# Patient Record
Sex: Female | Born: 1935 | Race: White | Hispanic: No | Marital: Married | Smoking: Never smoker
Health system: Southern US, Community
[De-identification: ages and names within clinical notes are randomized; demographics above are authoritative.]

## PROBLEM LIST (undated history)

## (undated) DIAGNOSIS — Z8639 Personal history of other endocrine, nutritional and metabolic disease: Secondary | ICD-10-CM

## (undated) DIAGNOSIS — T783XXA Angioneurotic edema, initial encounter: Secondary | ICD-10-CM

## (undated) DIAGNOSIS — I2510 Atherosclerotic heart disease of native coronary artery without angina pectoris: Secondary | ICD-10-CM

## (undated) DIAGNOSIS — L509 Urticaria, unspecified: Secondary | ICD-10-CM

## (undated) DIAGNOSIS — F0390 Unspecified dementia without behavioral disturbance: Secondary | ICD-10-CM

## (undated) DIAGNOSIS — E785 Hyperlipidemia, unspecified: Secondary | ICD-10-CM

## (undated) DIAGNOSIS — I10 Essential (primary) hypertension: Secondary | ICD-10-CM

## (undated) DIAGNOSIS — R9431 Abnormal electrocardiogram [ECG] [EKG]: Secondary | ICD-10-CM

## (undated) DIAGNOSIS — C801 Malignant (primary) neoplasm, unspecified: Secondary | ICD-10-CM

## (undated) DIAGNOSIS — N200 Calculus of kidney: Secondary | ICD-10-CM

## (undated) DIAGNOSIS — I219 Acute myocardial infarction, unspecified: Secondary | ICD-10-CM

## (undated) DIAGNOSIS — D649 Anemia, unspecified: Secondary | ICD-10-CM

## (undated) DIAGNOSIS — E039 Hypothyroidism, unspecified: Secondary | ICD-10-CM

## (undated) DIAGNOSIS — C4492 Squamous cell carcinoma of skin, unspecified: Secondary | ICD-10-CM

## (undated) DIAGNOSIS — R7303 Prediabetes: Secondary | ICD-10-CM

## (undated) DIAGNOSIS — H269 Unspecified cataract: Secondary | ICD-10-CM

## (undated) DIAGNOSIS — N390 Urinary tract infection, site not specified: Secondary | ICD-10-CM

## (undated) DIAGNOSIS — M1990 Unspecified osteoarthritis, unspecified site: Secondary | ICD-10-CM

## (undated) DIAGNOSIS — Z5189 Encounter for other specified aftercare: Secondary | ICD-10-CM

## (undated) HISTORY — DX: Anemia, unspecified: D64.9

## (undated) HISTORY — DX: Personal history of other endocrine, nutritional and metabolic disease: Z86.39

## (undated) HISTORY — DX: Urticaria, unspecified: L50.9

## (undated) HISTORY — DX: Encounter for other specified aftercare: Z51.89

## (undated) HISTORY — DX: Malignant (primary) neoplasm, unspecified: C80.1

## (undated) HISTORY — DX: Squamous cell carcinoma of skin, unspecified: C44.92

## (undated) HISTORY — DX: Angioneurotic edema, initial encounter: T78.3XXA

## (undated) HISTORY — DX: Unspecified osteoarthritis, unspecified site: M19.90

## (undated) HISTORY — PX: OTHER SURGICAL HISTORY: SHX169

## (undated) HISTORY — PX: TONSILLECTOMY: SUR1361

## (undated) HISTORY — DX: Hyperlipidemia, unspecified: E78.5

## (undated) HISTORY — DX: Prediabetes: R73.03

## (undated) HISTORY — DX: Unspecified dementia, unspecified severity, without behavioral disturbance, psychotic disturbance, mood disturbance, and anxiety: F03.90

## (undated) HISTORY — DX: Hypothyroidism, unspecified: E03.9

## (undated) HISTORY — DX: Essential (primary) hypertension: I10

## (undated) HISTORY — DX: Calculus of kidney: N20.0

## (undated) HISTORY — DX: Unspecified cataract: H26.9

## (undated) HISTORY — DX: Urinary tract infection, site not specified: N39.0

## (undated) HISTORY — PX: CARDIAC CATHETERIZATION: SHX172

## (undated) HISTORY — DX: Atherosclerotic heart disease of native coronary artery without angina pectoris: I25.10

## (undated) HISTORY — PX: TUBAL LIGATION: SHX77

## (undated) HISTORY — PX: ADENOIDECTOMY: SUR15

## (undated) HISTORY — DX: Acute myocardial infarction, unspecified: I21.9

---
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
## 1997-11-22 HISTORY — PX: APPENDECTOMY: SHX54

## 1998-11-24 ENCOUNTER — Ambulatory Visit (HOSPITAL_COMMUNITY): Admission: RE | Admit: 1998-11-24 | Discharge: 1998-11-24 | Payer: Self-pay | Admitting: Endocrinology

## 1998-11-24 ENCOUNTER — Encounter: Payer: Self-pay | Admitting: Endocrinology

## 1998-11-25 ENCOUNTER — Ambulatory Visit (HOSPITAL_COMMUNITY): Admission: RE | Admit: 1998-11-25 | Discharge: 1998-11-25 | Payer: Self-pay | Admitting: Internal Medicine

## 1999-10-29 ENCOUNTER — Ambulatory Visit (HOSPITAL_COMMUNITY): Admission: RE | Admit: 1999-10-29 | Discharge: 1999-10-29 | Payer: Self-pay | Admitting: Endocrinology

## 1999-10-29 ENCOUNTER — Encounter: Payer: Self-pay | Admitting: Endocrinology

## 2000-10-20 ENCOUNTER — Other Ambulatory Visit: Admission: RE | Admit: 2000-10-20 | Discharge: 2000-10-20 | Payer: Self-pay | Admitting: Gynecology

## 2000-11-04 ENCOUNTER — Encounter: Payer: Self-pay | Admitting: Family Medicine

## 2000-11-04 ENCOUNTER — Ambulatory Visit (HOSPITAL_COMMUNITY): Admission: RE | Admit: 2000-11-04 | Discharge: 2000-11-04 | Payer: Self-pay | Admitting: Family Medicine

## 2001-11-21 ENCOUNTER — Encounter: Payer: Self-pay | Admitting: Gynecology

## 2001-11-21 ENCOUNTER — Ambulatory Visit (HOSPITAL_COMMUNITY): Admission: RE | Admit: 2001-11-21 | Discharge: 2001-11-21 | Payer: Self-pay | Admitting: Gynecology

## 2002-07-31 ENCOUNTER — Other Ambulatory Visit: Admission: RE | Admit: 2002-07-31 | Discharge: 2002-07-31 | Payer: Self-pay | Admitting: Endocrinology

## 2002-11-19 ENCOUNTER — Other Ambulatory Visit: Admission: RE | Admit: 2002-11-19 | Discharge: 2002-11-19 | Payer: Self-pay | Admitting: Gynecology

## 2002-11-23 ENCOUNTER — Encounter: Payer: Self-pay | Admitting: Gynecology

## 2002-11-23 ENCOUNTER — Ambulatory Visit (HOSPITAL_COMMUNITY): Admission: RE | Admit: 2002-11-23 | Discharge: 2002-11-23 | Payer: Self-pay | Admitting: Gynecology

## 2003-01-06 ENCOUNTER — Inpatient Hospital Stay (HOSPITAL_COMMUNITY): Admission: EM | Admit: 2003-01-06 | Discharge: 2003-01-09 | Payer: Self-pay | Admitting: *Deleted

## 2003-01-06 ENCOUNTER — Encounter: Payer: Self-pay | Admitting: Endocrinology

## 2003-02-04 ENCOUNTER — Encounter (HOSPITAL_COMMUNITY): Admission: RE | Admit: 2003-02-04 | Discharge: 2003-05-05 | Payer: Self-pay | Admitting: Interventional Cardiology

## 2003-12-02 ENCOUNTER — Ambulatory Visit (HOSPITAL_COMMUNITY): Admission: RE | Admit: 2003-12-02 | Discharge: 2003-12-02 | Payer: Self-pay | Admitting: Gynecology

## 2004-01-16 ENCOUNTER — Ambulatory Visit (HOSPITAL_COMMUNITY): Admission: RE | Admit: 2004-01-16 | Discharge: 2004-01-16 | Payer: Self-pay | Admitting: Internal Medicine

## 2004-01-16 ENCOUNTER — Encounter: Payer: Self-pay | Admitting: Internal Medicine

## 2004-07-21 ENCOUNTER — Ambulatory Visit (HOSPITAL_COMMUNITY): Admission: RE | Admit: 2004-07-21 | Discharge: 2004-07-21 | Payer: Self-pay | Admitting: Endocrinology

## 2004-12-10 ENCOUNTER — Ambulatory Visit (HOSPITAL_COMMUNITY): Admission: RE | Admit: 2004-12-10 | Discharge: 2004-12-10 | Payer: Self-pay | Admitting: Endocrinology

## 2005-02-23 ENCOUNTER — Ambulatory Visit: Payer: Self-pay | Admitting: Family Medicine

## 2005-05-22 ENCOUNTER — Emergency Department (HOSPITAL_COMMUNITY): Admission: EM | Admit: 2005-05-22 | Discharge: 2005-05-22 | Payer: Self-pay | Admitting: Emergency Medicine

## 2005-07-05 ENCOUNTER — Ambulatory Visit: Payer: Self-pay | Admitting: Family Medicine

## 2005-07-05 ENCOUNTER — Other Ambulatory Visit: Admission: RE | Admit: 2005-07-05 | Discharge: 2005-07-05 | Payer: Self-pay | Admitting: Family Medicine

## 2005-07-14 ENCOUNTER — Ambulatory Visit: Payer: Self-pay | Admitting: Family Medicine

## 2005-12-13 ENCOUNTER — Ambulatory Visit (HOSPITAL_COMMUNITY): Admission: RE | Admit: 2005-12-13 | Discharge: 2005-12-13 | Payer: Self-pay | Admitting: Endocrinology

## 2006-12-19 ENCOUNTER — Ambulatory Visit (HOSPITAL_COMMUNITY): Admission: RE | Admit: 2006-12-19 | Discharge: 2006-12-19 | Payer: Self-pay | Admitting: Endocrinology

## 2007-01-25 ENCOUNTER — Encounter (INDEPENDENT_AMBULATORY_CARE_PROVIDER_SITE_OTHER): Payer: Self-pay | Admitting: Interventional Cardiology

## 2007-01-25 ENCOUNTER — Inpatient Hospital Stay (HOSPITAL_COMMUNITY): Admission: AD | Admit: 2007-01-25 | Discharge: 2007-01-26 | Payer: Self-pay | Admitting: Interventional Cardiology

## 2007-12-29 ENCOUNTER — Ambulatory Visit (HOSPITAL_COMMUNITY): Admission: RE | Admit: 2007-12-29 | Discharge: 2007-12-29 | Payer: Self-pay | Admitting: Endocrinology

## 2009-01-30 ENCOUNTER — Ambulatory Visit (HOSPITAL_COMMUNITY): Admission: RE | Admit: 2009-01-30 | Discharge: 2009-01-30 | Payer: Self-pay | Admitting: Endocrinology

## 2009-01-30 LAB — HM MAMMOGRAPHY

## 2010-01-19 ENCOUNTER — Telehealth: Payer: Self-pay | Admitting: Internal Medicine

## 2010-01-20 HISTORY — PX: JOINT REPLACEMENT: SHX530

## 2010-02-09 ENCOUNTER — Inpatient Hospital Stay (HOSPITAL_COMMUNITY): Admission: RE | Admit: 2010-02-09 | Discharge: 2010-02-12 | Payer: Self-pay | Admitting: Orthopedic Surgery

## 2010-02-20 ENCOUNTER — Ambulatory Visit: Payer: Self-pay | Admitting: Internal Medicine

## 2010-02-20 HISTORY — PX: CHOLECYSTECTOMY: SHX55

## 2010-02-22 ENCOUNTER — Encounter (INDEPENDENT_AMBULATORY_CARE_PROVIDER_SITE_OTHER): Payer: Self-pay

## 2010-02-24 ENCOUNTER — Telehealth: Payer: Self-pay | Admitting: Internal Medicine

## 2010-02-25 ENCOUNTER — Encounter: Payer: Self-pay | Admitting: Internal Medicine

## 2010-02-25 DIAGNOSIS — J984 Other disorders of lung: Secondary | ICD-10-CM

## 2010-03-12 ENCOUNTER — Ambulatory Visit (HOSPITAL_COMMUNITY): Admission: RE | Admit: 2010-03-12 | Discharge: 2010-03-12 | Payer: Self-pay | Admitting: Surgery

## 2010-03-12 ENCOUNTER — Encounter: Admission: RE | Admit: 2010-03-12 | Discharge: 2010-03-12 | Payer: Self-pay | Admitting: Surgery

## 2010-04-30 LAB — LIPID PANEL
HDL: 71 mg/dL — AB (ref 35–70)
Triglycerides: 168 mg/dL — AB (ref 40–160)

## 2010-06-04 ENCOUNTER — Encounter (INDEPENDENT_AMBULATORY_CARE_PROVIDER_SITE_OTHER): Payer: Self-pay | Admitting: *Deleted

## 2010-06-11 ENCOUNTER — Ambulatory Visit (HOSPITAL_COMMUNITY): Admission: RE | Admit: 2010-06-11 | Discharge: 2010-06-11 | Payer: Self-pay | Admitting: Obstetrics and Gynecology

## 2010-06-22 ENCOUNTER — Ambulatory Visit: Payer: Self-pay | Admitting: Internal Medicine

## 2010-06-22 ENCOUNTER — Ambulatory Visit: Payer: Self-pay | Admitting: Cardiovascular Disease

## 2010-06-22 DIAGNOSIS — E785 Hyperlipidemia, unspecified: Secondary | ICD-10-CM | POA: Insufficient documentation

## 2010-06-22 DIAGNOSIS — I10 Essential (primary) hypertension: Secondary | ICD-10-CM

## 2010-06-23 ENCOUNTER — Telehealth (INDEPENDENT_AMBULATORY_CARE_PROVIDER_SITE_OTHER): Payer: Self-pay | Admitting: *Deleted

## 2010-08-26 LAB — BASIC METABOLIC PANEL
BUN: 24 mg/dL — AB (ref 4–21)
Glucose: 103 mg/dL

## 2010-08-26 LAB — TSH: TSH: 0.88 u[IU]/mL (ref <=5.90)

## 2010-08-26 LAB — HEMOGLOBIN A1C: Hgb A1c MFr Bld: 5.7 % (ref 4.0–6.0)

## 2010-10-29 ENCOUNTER — Inpatient Hospital Stay (HOSPITAL_COMMUNITY): Admission: EM | Admit: 2010-10-29 | Discharge: 2010-02-25 | Payer: Self-pay | Admitting: Emergency Medicine

## 2010-11-24 ENCOUNTER — Telehealth: Payer: Self-pay | Admitting: Internal Medicine

## 2010-11-24 ENCOUNTER — Encounter: Payer: Self-pay | Admitting: Internal Medicine

## 2010-12-16 ENCOUNTER — Other Ambulatory Visit: Payer: Self-pay | Admitting: Internal Medicine

## 2010-12-16 DIAGNOSIS — R911 Solitary pulmonary nodule: Secondary | ICD-10-CM

## 2010-12-24 NOTE — Procedures (Signed)
Summary: COLONOSCOPY    Patient Name: Lindsey Erickson, Lindsey Erickson. MRN: [id number] Procedure Procedures: Colonoscopy CPT: 445 [id number].  Personnel: Endoscopist: Onaje Warne L. Juanda Chance, MD.  Exam Location: Exam performed in Endoscopy Suite.  Patient Consent: Procedure, Alternatives, Risks and Benefits discussed, consent obtained,  Indications  Surveillance of: Adenomatous Polyp(s). Initial polypectomy was performed in 1999. 1-2 Polyps were found at Index Exam. Largest polyp removed was > 19 mm. Prior polyp located in distal colon. Pathology of worst  polyp: high- grade dysplasia. The patient has not had surgery. 2000.  History  Current Medications: Patient is taking an non-steroidal medication. Patient is not currently taking Coumadin.  Pre-Exam Physical: Performed Jan 16, 2004. Cardio-pulmonary exam, Rectal exam, HEENT exam , Abdominal exam, Extremity exam, Neurological exam, Mental status exam WNL.  Exam Exam: Extent of exam reached: Cecum, extent intended: Cecum.  The cecum was identified by appendiceal orifice and IC valve. Colon retroflexion performed. Images taken. ASA Classification: II. Tolerance: good.  Monitoring: Pulse and BP monitoring, Oximetry used. Supplemental O2 given.  Colon Prep Used Miralax for colon prep. Prep results: good.  Fluoroscopy: Fluoroscopy was not used.  Sedation Meds: Demerol 45 mg. given IV. Versed 7 mg. given IV.  Findings NORMAL EXAM: Cecum.   Assessment Normal examination.  Comments: no recurrent polyps Events  Unplanned Interventions: No intervention was required.  Unplanned Events: There were no complications. Plans Patient Education: Patient given standard instructions for: Yearly hemoccult testing recommended. Patient instructed to get routine colonoscopy every 5 years.  Comments: Resume ASA tomorrow Disposition: After procedure patient sent to recovery.    CC: Alfonse Alpers.Baruch Merl Smith,MD  This report was created from the  original endoscopy report, which was reviewed and signed by the above listed endoscopist.

## 2010-12-24 NOTE — Letter (Signed)
Summary: Pre Visit Letter Revised  [facility]  [address] [place], [place] [address]   Phone: [phone]  Fax: [phone]        11/24/2010 MRN: [id number] Lindsey Erickson [address] [place], [place]  [address]             Procedure Date:  01/05/2011   Welcome to the Gastroenterology Division at [facility].    You are scheduled to see a nurse for your pre-procedure visit on 12/29/2010 at 10:00 AM  on the 3rd floor at [facility], [address]  We ask that you try to arrive at our office 15 minutes prior to your appointment time to allow for check-in.  Please take a minute to review the attached form.  If you answer "Yes" to one or more of the questions on the first page, we ask that you call the person listed at your earliest opportunity.  If you answer "No" to all of the questions, please complete the rest of the form and bring it to your appointment.    Your nurse visit will consist of discussing your medical and surgical history, your immediate family medical history, and your medications.   If you are unable to list all of your medications on the form, please bring the medication bottles to your appointment and we will list them.  We will need to be aware of both prescribed and over the counter drugs.  We will need to know exact dosage information as well.    Please be prepared to read and sign documents such as consent forms, a financial agreement, and acknowledgement forms.  If necessary, and with your consent, a friend or relative is welcome to sit-in on the nurse visit with you.  Please bring your insurance card so that we may make a copy of it.  If your insurance requires a referral to see a specialist, please bring your referral form from your primary care physician.  No co-pay is required for this nurse visit.     If you cannot keep your appointment, please call [phone] to cancel or reschedule prior to your appointment date.  This  allows [place] the opportunity to schedule an appointment for another patient in need of care.    Thank you for choosing [facility] for your medical needs.  We appreciate the opportunity to care for you.  Please visit [place] at our website  to learn more about our practice.  Sincerely, The Gastroenterology Division

## 2010-12-24 NOTE — Progress Notes (Signed)
Summary: needs fu for nodule  Phone Note Outgoing Call   Summary of Call: Lindsey Erickson, saw her 4/2 and 4/3 over [facility] at [facility] when she reacted to blood product. She hasa small rt lung 8mm nodule (passive smoker x 18 years, dad had lung cancer). She needs to see me in 3 months with non contrat ct chest. She is now haivng cholecustectomy. I did giver he my card Initial call taken by: Kalman Shan MD,  February 24, 2010 9:06 AM     Appended Document: needs fu for nodule order placed for ct and consult appt in three months.

## 2010-12-24 NOTE — Progress Notes (Signed)
Summary: Triage  Phone Note Call from Patient Call back at Home Phone [phone]   Caller: Patient Call For: Dr. Juanda Chance Reason for Call: Talk to Nurse, Referral Summary of Call: Needs to sch'd her COL and last time it was done at the hosp. Wants to know if it can be sch'd in [facility] or at the hosp. Initial call taken by: Karna Christmas,  November 24, 2010 9:34 AM  Follow-up for Phone Call        Patient called to schedule her colonscopy that is due 12/2010. She reports that she has had bright red blood in her stool off and on for about a year but yesterday she had a stool with a lot of bright red blood in the toilet. This is the "worse she has ever had." Denies pain or hx of hemorrhoids. States her stomach feels full in AM but denies any pain. Patient is on ASA 81 mg daily but not on any blood thinners. She reports her last colonoscpy was done at the hospital (2005) because she had a heart attack in 2004. Should she have a visit prior to scheduling colon and should she have it scheduled at [facility] or hospital? Please,advise. Follow-up by: Jesse Fall RN,  November 24, 2010 10:32 AM  Additional Follow-up for Phone Call Additional follow up Details #1::        it can be scheduled directly, at [facility], please prescribe Analpram cream 22.5 %, apply two times a day to the rectum Additional Follow-up by: Hart Carwin MD,  November 24, 2010 11:35 AM    Additional Follow-up for Phone Call Additional follow up Details #2::    Spoke with patient and gave her Dr. Regino Schultze recommendations. Rx to pharmacy. Scheduled patient for 12/29/10 at 10:00 AM for previsit and 01/05/11 11:00 AM with 10:00 AM arrival for colonoscopy. Letter for previsit mailed to patient. Follow-up by: Jesse Fall RN,  November 24, 2010 3:05 PM  New/Updated Medications: ANALPRAM-HC 1-2.5 % CREA (HYDROCORTISONE ACE-PRAMOXINE) Apply two times a day to rectum Prescriptions: ANALPRAM-HC 1-2.5 % CREA (HYDROCORTISONE ACE-PRAMOXINE) Apply two  times a day to rectum  #1 tube x 0   Entered by:   Jesse Fall RN   Authorized by:   Hart Carwin MD   Signed by:   Jesse Fall RN on 11/24/2010   Method used:   [facility]  [address] [phone]* (retail)       [address]       [place], [place]  [address]       Ph: [phone]       Fax: [phone]   RxID:   [id number]

## 2010-12-24 NOTE — Miscellaneous (Signed)
Summary: CT chest appt with MR follow up  ---- Converted from flag ---- ---- 02/25/2010 9:05 AM, Carron Curie CMA wrote: Pt needs consult scheduled with MR sometime in July after CT scan appt. ------------------------------  per Bjorn Loser pt has CT chest scheduled for 8.1.11 @ 1pm with follow up with MR same day @ 1:50pm.

## 2010-12-24 NOTE — Progress Notes (Signed)
Summary: copy of 8.1.11 CT report mailed to pt's home  Phone Note Call from Patient Call back at Home Phone [phone]   Caller: Patient Call For: ramaswamy Reason for Call: Talk to Nurse Summary of Call: pt would like a copy of ct report sent to her at her home address. Initial call taken by: Eugene Gavia,  June 23, 2010 8:41 AM  Follow-up for Phone Call        dr Marchelle Gearing is it ok to send a copy of the ct report to patient?    you have signed it, I just want to make sure you have given results to pt.   pls advise   Philipp Deputy [facility]  June 23, 2010 9:46 AM   Additional Follow-up for Phone Call Additional follow up Details #1::        yes, please do so Additional Follow-up by: Kalman Shan MD,  June 23, 2010 2:14 PM    Additional Follow-up for Phone Call Additional follow up Details #2::    CT report printed off from EMR.  mailed to verified home address.  pt is aware. Follow-up by: Boone Master CNA/MA,  June 23, 2010 2:25 PM

## 2010-12-24 NOTE — Miscellaneous (Signed)
Summary: Orders Update  Clinical Lists Changes  Problems: Added new problem of PULMONARY NODULE (ICD-518.89) Orders: Added new Referral order of Radiology Referral (Radiology) - Signed

## 2010-12-24 NOTE — Progress Notes (Signed)
Summary: TRIAGE  Phone Note Call from Patient Call back at Home Phone [phone]   Caller: Patient Call For: Dr. Juanda Chance Reason for Call: Talk to Nurse Summary of Call: Pt. is wanting to know when her next colonoscopy is due?  Initial call taken by: Karna Christmas,  January 19, 2010 8:58 AM  Follow-up for Phone Call        Per Colonoscopy report from 12/2003 she was due for repeat Colonoscopy 12/2008.   Message left for patient to callback. Laureen Ochs LPN  January 19, 2010 9:19 AM   Pt. states she spoke with Dr.Benford Asch and was told she can go 7 years, as long as she is feeling well. Pt. denies any GI c/o.  I will put the recall in IDX for 12/2010. Pt. instructed to call back as needed.  Follow-up by: Laureen Ochs LPN,  January 19, 2010 11:00 AM

## 2010-12-24 NOTE — Assessment & Plan Note (Signed)
Summary: PULMONARY NODULE/SEEN PT IN HOSP/[facility]   Visit Type:  Follow-up Copy to:  Hospital pt Primary Provider/Referring Provider:  Dr. Corrin Parker  CC:  Pulmonary Consult for pulmonary nodule.  Pt was seen in the hospital by MR.  Denies any problems with breathing since discharge.Lindsey Erickson  History of Present Illness:  77 year old ,never smoker (Exposed to pasisve smoke 1954 - 1972). Was admitted late march/early6 april 2011 for acute cholecystitis. CT chest3/31/2011 showed 8 mm subpleural nodule in   the anterior aspect of the right upper lobe on image number 64. Pulmonary was involved when she developed TRALI due to FFP. She recoverd from that and was discharged. Since then she had D and C in July 2011. Otherwise feeling  healthy other than chronic OA related pain of knees. SHe is following up today for fllowup of pulmonary nodule. No complaints.  Denies chest pain, dyspnea, hemoptysis, wheezing, cough, fever, chills or any resp issues. CT chest today shows stable RUL nodule to my review  Preventive Screening-Counseling & Management  Alcohol-Tobacco     Smoking Status: never     Passive Smoke Exposure: no  Comments: prior passive smoke - 1954 to 1972  Current Medications (verified): 1)  Multivitamin .... One Tab Daily 2)  Lisinopril 40 Mg Tabs (Lisinopril) .... Once Daily 3)  Crestor 10 Mg Tabs (Rosuvastatin Calcium) .... Once Daily 4)  Corgard 40 Mg Tabs (Nadolol) .... Once Daily 5)  Calcium With Vitamin D .... Two Times A Day 6)  Aspir-Low 81 Mg Tbec (Aspirin) .... Once Daily 7)  Vitamin C Cr 500 Mg Cr-Tabs (Ascorbic Acid) .... Once Daily 8)  Actonel 35 Mg Tabs (Risedronate Sodium) .... Once Weekly 9)  Synthroid 88 Mcg Tabs (Levothyroxine Sodium) .... Once Daily 10)  Msm 1000 Mg Caps (Methylsulfonylmethane) .... Take 1 Capsule By Mouth Two Times A Day  Allergies (verified): 1)  ! * Frozen Blood Plasma 2)  * Narcotics  Past History:  Past Medical History: Last updated:  06/19/2010 Myocardial Infarction hypothyroidism cholecystitis kideny stones UTI osteoporosis arthritis hypertension squamous cell carcinoma  Past Surgical History: Last updated: 06/19/2010 Appendectomy--1999 Knee replacement--2011 Cholecsytectomy--april2011 bilateral arthroscopies of knees  Family History: Last updated: 06/22/2010 mother-CHF father-allergies, cancer ?type sister-blood clot  Social History: Last updated: 06/22/2010 Patient never smoked.  Married 2 children Retired from [employer] no alcohol   Risk Factors: Smoking Status: never (06/22/2010) Passive Smoke Exposure: no (06/22/2010)  Past Pulmonary History:  Pulmonary History: 1. Acute cholecystitis. 2. History of myocardial infarction in 2004. 3. History of thyroid nodule with goiter with Hashimoto thyroiditis. 4. Hypercholesterolemia. 5. Osteopenia. 6. Hypertension. 7. Gastroesophageal reflux disease. 8. Coronary artery disease.  Family History: mother-CHF father-allergies, cancer ?type sister-blood clot  Social History: Patient never smoked.  Married 2 children Retired from [employer] no alcohol Passive Smoke Exposure:  no  Review of Systems       The patient complains of acid heartburn, indigestion, sneezing, and joint stiffness or pain.  The patient denies shortness of breath with activity, shortness of breath at rest, productive cough, non-productive cough, coughing up blood, chest pain, irregular heartbeats, loss of appetite, weight change, abdominal pain, difficulty swallowing, sore throat, tooth/dental problems, headaches, nasal congestion/difficulty breathing through nose, itching, ear ache, anxiety, depression, rash, change in color of mucus, and fever.    Vital Signs:  Patient profile:   77 year old female Height:      67 inches Weight:      168.38 pounds BMI:  26.47 O2 Sat:      97 % on Room air Temp:     99.3 degrees F oral Pulse rate:   81 /  minute BP sitting:   159 / 100  (left arm) Cuff size:   regular  Vitals Entered By: Gweneth Dimitri RN (June 22, 2010 1:53 PM)  O2 Flow:  Room air CC: Pulmonary Consult for pulmonary nodule.  Pt was seen in the hospital by MR.  Denies any problems with breathing since discharge. Comments Medications reviewed with patient Daytime contact number verified with patient. Gweneth Dimitri RN  June 22, 2010 1:54 PM    Physical Exam  General:  well developed, well nourished, in no acute distress Head:  normocephalic and atraumatic Eyes:  PERRLA/EOM intact; conjunctiva and sclera clear Ears:  TMs intact and clear with normal canals Nose:  no deformity, discharge, inflammation, or lesions Mouth:  no deformity or lesions Neck:  enlarged thyroid.  - old and chronic Chest Wall:  no deformities noted Lungs:  clear bilaterally to auscultation and percussion Heart:  regular rate and rhythm, S1, S2 without murmurs, rubs, gallops, or clicks Abdomen:  bowel sounds positive; abdomen soft and non-tender without masses, or organomegaly Msk:  no deformity or scoliosis noted with normal posture Pulses:  pulses normal Extremities:  no clubbing, cyanosis, edema, or deformity noted Neurologic:  CN II-XII grossly intact with normal reflexes, coordination, muscle strength and tone Skin:  intact without lesions or rashes Cervical Nodes:  no significant adenopathy Axillary Nodes:  no significant adenopathy Psych:  alert and cooperative; normal mood and affect; normal attention span and concentration   CT of Sinus  Procedure date:  06/22/2010  Findings:      RUL nodule is stable. Seen on image 24. THis is my personal opionion. Official report pending  Impression & Recommendations:  Problem # 1:  PULMONARY NODULE, RIGHT UPPER LOBE (ICD-518.89) Assessment Unchanged CT chest 02/19/2010 - > CT chest 06/22/2010: No change in 8mm RUL subpleural nodule. Low pretest prob for cancer given stability and non  smoking hx and size. Still requires followup for 2 years.   plan repeat ct chest in 6 months (she wants low dose radiation and limited field due to her thyroid issues, I will request as such) Orders: Radiology Referral (Radiology)  Medications Added to Medication List This Visit: 1)  Calcium With Vitamin D  .... Two times a day 2)  Vitamin C Cr 500 Mg Cr-tabs (Ascorbic acid) .... Once daily 3)  Msm 1000 Mg Caps (Methylsulfonylmethane) .... Take 1 capsule by mouth two times a day  Patient Instructions: 1)  your nodule is stable since april 2)  I am still waiting for official report - if there is a changeI will let you kow 3)  have CT chest in 6 month and return for followup opr even call for results - we can discuss over phone   Immunization History:  Influenza Immunization History:    Influenza:  historical (07/23/2009)   Appended Document: PULMONARY NODULE/SEEN PT IN HOSP/[facility]     Allergies: 1)  ! * Frozen Blood Plasma 2)  * Narcotics  Past Pulmonary History:  Pulmonary History: 1. Acute cholecystitis. 2. History of myocardial infarction in 2004. 3. History of thyroid nodule with goiter with Hashimoto thyroiditis. 4. Hypercholesterolemia. 5. Osteopenia. 6. Hypertension. 7. Gastroesophageal reflux disease. 8. Coronary artery disease.   Other Orders: Est. Patient Level III (81191)

## 2010-12-28 ENCOUNTER — Encounter (INDEPENDENT_AMBULATORY_CARE_PROVIDER_SITE_OTHER): Payer: Self-pay

## 2010-12-28 ENCOUNTER — Ambulatory Visit (INDEPENDENT_AMBULATORY_CARE_PROVIDER_SITE_OTHER)
Admission: RE | Admit: 2010-12-28 | Discharge: 2010-12-28 | Disposition: A | Discharge status: Home / Self Care | Payer: Medicare Other | Source: Ambulatory Visit | Attending: Internal Medicine | Admitting: Internal Medicine

## 2010-12-28 DIAGNOSIS — R911 Solitary pulmonary nodule: Secondary | ICD-10-CM

## 2010-12-28 DIAGNOSIS — J984 Other disorders of lung: Secondary | ICD-10-CM

## 2010-12-29 ENCOUNTER — Encounter: Payer: Self-pay | Admitting: Internal Medicine

## 2010-12-29 ENCOUNTER — Telehealth: Payer: Self-pay | Admitting: Internal Medicine

## 2010-12-31 ENCOUNTER — Telehealth: Payer: Self-pay | Admitting: Internal Medicine

## 2011-01-05 ENCOUNTER — Encounter: Payer: Self-pay | Admitting: Internal Medicine

## 2011-01-05 ENCOUNTER — Other Ambulatory Visit (AMBULATORY_SURGERY_CENTER): Payer: Medicare Other | Admitting: Internal Medicine

## 2011-01-05 DIAGNOSIS — Z86010 Personal history of colonic polyps: Secondary | ICD-10-CM

## 2011-01-05 DIAGNOSIS — K921 Melena: Secondary | ICD-10-CM

## 2011-01-05 DIAGNOSIS — K648 Other hemorrhoids: Secondary | ICD-10-CM

## 2011-01-05 LAB — HM COLONOSCOPY: HM Colonoscopy: NORMAL

## 2011-01-07 NOTE — Letter (Signed)
Summary: [facility] Instructions  [facility]  [address] [place], [place] [address]   Phone: [phone]  Fax: [phone]       Lindsey Erickson    09-20-1932    MRN: [id number]       Procedure Day /Date: Tuesday 01/05/2011     Arrival Time: 10:00 am     Procedure Time: 11:00 am     Location of Procedure:                    _x _  [facility] (4th Floor)   PREPARATION FOR COLONOSCOPY WITH MIRALAX  Starting 5 days prior to your procedure Thursday 2/9 do not eat nuts, seeds, popcorn, corn, beans, peas,  salads, or any raw vegetables.  Do not take any fiber supplements (e.g. Metamucil, Citrucel, and Benefiber). ____________________________________________________________________________________________________   THE DAY BEFORE YOUR PROCEDURE         DATE: Monday 2/13  1   Drink clear liquids the entire day-NO SOLID FOOD  2   Do not drink anything colored red or purple.  Avoid juices with pulp.  No orange juice.  3   Drink at least 64 oz. (8 glasses) of fluid/clear liquids during the day to prevent dehydration and help the prep work efficiently.  CLEAR LIQUIDS INCLUDE: Water Jello Ice Popsicles Tea (sugar ok, no milk/cream) Powdered fruit flavored drinks Coffee (sugar ok, no milk/cream) Gatorade Juice: apple, white grape, white cranberry  Lemonade Clear bullion, consomm, broth Carbonated beverages (any kind) Strained chicken noodle soup Hard Candy  4   Mix the entire bottle of Miralax with 64 oz. of Gatorade/Powerade in the morning and put in the refrigerator to chill.  5   At 3:00 pm take 2 Dulcolax/Bisacodyl tablets.  6   At 4:30 pm take one Reglan/Metoclopramide tablet.  7  Starting at 5:00 pm drink one 8 oz glass of the Miralax mixture every 15-20 minutes until you have finished drinking the entire 64 oz.  You should finish drinking prep around 7:30 or 8:00 pm.  8   If you are nauseated, you may take the 2nd Reglan/Metoclopramide  tablet at 6:30 pm.        9    At 8:00 pm take 2 more DULCOLAX/Bisacodyl tablets.     THE DAY OF YOUR PROCEDURE      DATE:  Tuesday 2/14  You may drink clear liquids until 9:00 am  (2 HOURS BEFORE PROCEDURE).   MEDICATION INSTRUCTIONS  Unless otherwise instructed, you should take regular prescription medications with a small sip of water as early as possible the morning of your procedure.         OTHER INSTRUCTIONS  You will need a responsible adult at least 75 years of age to accompany you and drive you home.   This person must remain in the waiting room during your procedure.  Wear loose fitting clothing that is easily removed.  Leave jewelry and other valuables at home.  However, you may wish to bring a book to read or an iPod/MP3 player to listen to music as you wait for your procedure to start.  Remove all body piercing jewelry and leave at home.  Total time from sign-in until discharge is approximately 2-3 hours.  You should go home directly after your procedure and rest.  You can resume normal activities the day after your procedure.  The day of your procedure you should not:   Drive   Make legal decisions  Operate machinery   Drink alcohol   Return to work  You will receive specific instructions about eating, activities and medications before you leave.   The above instructions have been reviewed and explained to me by   Ulis Rias RN  December 29, 2010 10:47 AM     I fully understand and can verbalize these instructions _____________________________ Date _______

## 2011-01-07 NOTE — Miscellaneous (Signed)
Summary: [facility] previsit  Clinical Lists Changes  Medications: Added new medication of MIRALAX   POWD (POLYETHYLENE GLYCOL 3350) As per prep  instructions. - Signed Added new medication of REGLAN 10 MG  TABS (METOCLOPRAMIDE HCL) As per prep instructions. - Signed Added new medication of DULCOLAX 5 MG  TBEC (BISACODYL) Day before procedure take 2 at 3pm and 2 at 8pm. - Signed Rx of MIRALAX   POWD (POLYETHYLENE GLYCOL 3350) As per prep  instructions.;  #255gm x 0;  Signed;  Entered by: Ulis Rias RN;  Authorized by: Hart Carwin MD;  Method [facility]: [id number] [address] [place], [place], [place]  [address], Ph: [phone], Fax: [phone] Rx of REGLAN 10 MG  TABS (METOCLOPRAMIDE HCL) As per prep instructions.;  #2 x 0;  Signed;  Entered by: Ulis Rias RN;  Authorized by: Hart Carwin MD;  Method used: [id number] [address] [place], [place], [place]  [address], Ph: [phone], Fax: [phone] Rx of DULCOLAX 5 MG  TBEC (BISACODYL) Day before procedure take 2 at 3pm and 2 at 8pm.;  #4 x 0;  Signed;  Entered by: Ulis Rias RN;  Authorized by: Hart Carwin MD;  Method used: [facility] [phone]*, [address] [place], [place], [place]  [address], Ph: [phone], Fax: [phone] Observations: Added new observation of ALLERGY REV: Done (12/29/2010 9:26)    Prescriptions: DULCOLAX 5 MG  TBEC (BISACODYL) Day before procedure take 2 at 3pm and 2 at 8pm.  #4 x 0   Entered by:   Ulis Rias RN   Authorized by:   Hart Carwin MD   Signed by:   Ulis Rias RN on 12/29/2010   Method used:   [facility]        [facility]  [address] [phone]* (retail)       [address]       [place], [place]  [address]       Ph: [phone]       Fax: [phone]   RxID:   [id number] REGLAN 10 MG  TABS (METOCLOPRAMIDE HCL) As per prep instructions.  #2 x 0  Entered by:   Ulis Rias RN   Authorized by:   Hart Carwin MD   Signed by:   Ulis Rias RN on 12/29/2010   Method used:   [facility]        [facility]  [address] [phone]* (retail)       [address]       [place], [place]  [address]       Ph: [phone]       Fax: [phone]   RxID:   [id number] MIRALAX   POWD (POLYETHYLENE GLYCOL 3350) As per prep  instructions.  #255gm x 0   Entered by:   Ulis Rias RN   Authorized by:   Hart Carwin MD   Signed by:   Ulis Rias RN on 12/29/2010   Method used:   [facility] to        [facility]  [address] [phone]* (retail)       [address]       [place], [place]  [address]  Ph: [phone]       Fax: [phone]   RxID:   [id number]

## 2011-01-07 NOTE — Progress Notes (Signed)
Summary: CT results/ mail request  Phone Note Call from Patient Call back at Home Phone [phone]   Caller: Patient Call For: Jaymin Waln Summary of Call: pt wants a copy of CT results mailed to her home address.  Initial call taken by: Tivis Ringer, CNA,  December 31, 2010 12:04 PM  Follow-up for Phone Call        MR- is this okay with you if we just mail her CT chest results? Pls advise thanks! Vernie Murders  December 31, 2010 12:22 PM   Additional Follow-up for Phone Call Additional follow up Details #1::        yeah please do so Additional Follow-up by: Kalman Shan MD,  December 31, 2010 1:05 PM    Additional Follow-up for Phone Call Additional follow up Details #2::    ct results mailed. Carron Curie CMA  December 31, 2010 1:21 PM

## 2011-01-07 NOTE — Progress Notes (Signed)
Summary: repeat ct 18 months  Phone Note Outgoing Call   Summary of Call: called patient and gave update on CT result 12/28/2010. Nodule stable for 1 year. Repeat CT 18 months and then see me. She is agreeable with plan. Pls set up Ct (ordered) and fu Initial call taken by: Kalman Shan MD,  December 31, 2010 11:49 AM  Follow-up for Phone Call        order placed.Carron Curie CMA  December 31, 2010 3:04 PM

## 2011-01-07 NOTE — Progress Notes (Signed)
Summary: Triage  Phone Note Call from Patient Call back at Home Phone [phone]   Caller: Patient Call For: Dr. Juanda Chance Reason for Call: Talk to Nurse Summary of Call: pt. had knee surgery before and wants to know if she will need an antibotic before her COL Initial call taken by: Karna Christmas,  December 29, 2010 11:52 AM  Follow-up for Phone Call        Spoke with pt and informed her that our doctors do not prescribe antibiotics prior to a colonoscopy due to the pt having had knee surgery. Advised pt to talk to her PCP or othopedic doctor to discuss if she needs antibiotics. She understood and will call back if she has further questions.Ulis Rias RN  December 29, 2010 12:55 PM

## 2011-01-13 NOTE — Miscellaneous (Signed)
Summary: anusol HC suppository  Clinical Lists Changes  Medications: Added new medication of ANUSOL-HC 25 MG  SUPP (HYDROCORTISONE ACETATE) one suppository per rectum @ bedtime - Signed Rx of ANUSOL-HC 25 MG  SUPP (HYDROCORTISONE ACETATE) one suppository per rectum @ bedtime;  #12 x 1;  Signed;  Entered by: Doristine Church RN II;  Authorized by: Hart Carwin MD;  Method used: Electronically to [id number] [address] [place], [place], [place]  [address], Ph: [phone], Fax: [phone] Observations: Added new observation of ALLERGY REV: Done (01/05/2011 12:09)    Prescriptions: ANUSOL-HC 25 MG  SUPP (HYDROCORTISONE ACETATE) one suppository per rectum @ bedtime  #12 x 1   Entered by:   Doristine Church RN II   Authorized by:   Hart Carwin MD   Signed by:   Doristine Church RN II on 01/05/2011   Method used:   [facility]ronically to        [id number]       [address]       [place], [place]  [address]       Ph: [phone]       Fax: [phone]   RxID:   [id number]

## 2011-01-13 NOTE — Procedures (Signed)
Summary: Colonoscopy  Patient: [name] Note: All result statuses are Final unless otherwise noted.  Tests: (1) Colonoscopy (COL)   COL Colonoscopy           DONE     [facility]     [address]     [place], [place]  [address]           COLONOSCOPY PROCEDURE REPORT           PATIENT:  Lindsey Erickson, Lindsey Erickson  MR#:  [id number]     BIRTHDATE:  1931/12/07, 78 yrs. old  GENDER:  female     ENDOSCOPIST:  Hedwig Morton. Juanda Chance, MD     REF. BY:  Corrin Parker, M.D.     PROCEDURE DATE:  01/05/2011     PROCEDURE:  Colonoscopy 57[address]     ASA CLASS:  Class II     INDICATIONS:  colorectal cancer screening, average risk,     hematochezia hx high grade dysplasia in a sigmoid polyp 1999, last     colon 2005 no polyps     MEDICATIONS:   Versed 7 mg, promethazine (Phenergan) 12.5 mg           DESCRIPTION OF PROCEDURE:   After the risks benefits and     alternatives of the procedure were thoroughly explained, informed     consent was obtained.  Digital rectal exam was performed and     revealed no rectal masses.   The LB PCF-H180AL [id number] endoscope     was introduced through the anus and advanced to the cecum, which     was identified by both the appendix and ileocecal valve, without     limitations.  The quality of the prep was good, using MiraLax.     The instrument was then slowly withdrawn as the colon was fully     examined.     <<PROCEDUREIMAGES>>           FINDINGS:  Internal Hemorrhoids were found (see image4).  This was     otherwise a normal examination of the colon (see image3, image2,     and image1).   Retroflexed views in the rectum revealed no     abnormalities.    The scope was then withdrawn from the patient     and the procedure completed.           COMPLICATIONS:  None     ENDOSCOPIC IMPRESSION:     1) Internal hemorrhoids     2) Otherwise normal examination     bleeding likely from hemorrhoids     RECOMMENDATIONS:     1) high fiber diet     Anusol HC supp 1 hs,  #12,1 refill     REPEAT EXAM:  In 0 year(s) for.  no recall due to age ( would be     10 years)           ______________________________     Hedwig Morton. Juanda Chance, MD           CC:           n.     eSIGNED:   Hedwig Morton. Sena Hoopingarner at 01/05/2011 11:56 AM           [name], [id number]  Note: An exclamation mark (!) indicates a result that was not dispersed into the flowsheet. Document Creation Date: 01/05/2011 11:57 AM _______________________________________________________________________  (1) Order result status: Final Collection or observation date-time: 01/05/2011 11:48 Requested date-time:  Receipt  date-time:  Reported date-time:  Referring Physician:   Ordering Physician: Lina Sar [phone]) Specimen Source:  Source: [name] Order Number: [id number] Lab site:

## 2011-01-21 DIAGNOSIS — N200 Calculus of kidney: Secondary | ICD-10-CM | POA: Insufficient documentation

## 2011-02-06 LAB — COMPREHENSIVE METABOLIC PANEL
ALT: 15 U/L (ref 0–35)
Alkaline Phosphatase: 65 U/L (ref 39–117)
CO2: 25 mEq/L (ref 19–32)
Chloride: 100 mEq/L (ref 96–112)
GFR calc non Af Amer: >60 mL/min (ref >60)
Glucose, Bld: 98 mg/dL (ref 70–99)
Potassium: 4 mEq/L (ref 3.5–5.1)
Sodium: 136 mEq/L (ref 135–145)

## 2011-02-06 LAB — CBC
Hemoglobin: 14 g/dL (ref 12.0–15.0)
MCH: 33.7 pg (ref 26.0–34.0)
RBC: 4.15 MIL/uL (ref 3.87–5.11)
WBC: 6 10*3/uL (ref 4.0–10.5)

## 2011-02-06 LAB — BASIC METABOLIC PANEL
CO2: 28 mEq/L (ref 19–32)
Chloride: 100 mEq/L (ref 96–112)
GFR calc Af Amer: >60 mL/min (ref >60)
Potassium: 3.8 mEq/L (ref 3.5–5.1)
Sodium: 135 mEq/L (ref 135–145)

## 2011-02-10 ENCOUNTER — Encounter: Payer: Self-pay | Admitting: *Deleted

## 2011-02-10 LAB — CBC
HCT: 27.9 % — ABNORMAL LOW (ref 36.0–46.0)
HCT: 28.2 % — ABNORMAL LOW (ref 36.0–46.0)
HCT: 29.1 % — ABNORMAL LOW (ref 36.0–46.0)
Hemoglobin: 10 g/dL — ABNORMAL LOW (ref 12.0–15.0)
Hemoglobin: 9.4 g/dL — ABNORMAL LOW (ref 12.0–15.0)
Hemoglobin: 9.6 g/dL — ABNORMAL LOW (ref 12.0–15.0)
MCHC: 33.9 g/dL (ref 30.0–36.0)
MCHC: 34 g/dL (ref 30.0–36.0)
MCHC: 34.1 g/dL (ref 30.0–36.0)
MCV: 94.8 fL (ref 78.0–100.0)
MCV: 95.3 fL (ref 78.0–100.0)
Platelets: 330 10*3/uL (ref 150–400)
Platelets: 433 10*3/uL — ABNORMAL HIGH (ref 150–400)
RBC: 3.07 MIL/uL — ABNORMAL LOW (ref 3.87–5.11)
RDW: 13.3 % (ref 11.5–15.5)
RDW: 13.8 % (ref 11.5–15.5)
RDW: 13.9 % (ref 11.5–15.5)
WBC: 18.4 10*3/uL — ABNORMAL HIGH (ref 4.0–10.5)
WBC: 19.5 10*3/uL — ABNORMAL HIGH (ref 4.0–10.5)

## 2011-02-10 LAB — MAGNESIUM: Magnesium: 2.2 mg/dL (ref 1.5–2.5)

## 2011-02-10 LAB — BASIC METABOLIC PANEL
CO2: 24 mEq/L (ref 19–32)
Chloride: 107 mEq/L (ref 96–112)
Creatinine, Ser: 0.69 mg/dL (ref 0.4–1.2)
GFR calc Af Amer: >60 mL/min (ref >60)
Potassium: 4.3 mEq/L (ref 3.5–5.1)

## 2011-02-10 LAB — BLOOD GAS, ARTERIAL
Acid-base deficit: 3.7 mmol/L — ABNORMAL HIGH (ref 0.0–2.0)
Bicarbonate: 19.3 mEq/L — ABNORMAL LOW (ref 20.0–24.0)
Drawn by: 105521
O2 Saturation: 94.3 %
TCO2: 20.1 mmol/L (ref 0–100)
pCO2 arterial: 26.5 mmHg — ABNORMAL LOW (ref 35.0–45.0)
pO2, Arterial: 65.7 mmHg — ABNORMAL LOW (ref 80.0–100.0)

## 2011-02-10 LAB — COMPREHENSIVE METABOLIC PANEL
AST: 25 U/L (ref 0–37)
Albumin: 2.1 g/dL — ABNORMAL LOW (ref 3.5–5.2)
Albumin: 3 g/dL — ABNORMAL LOW (ref 3.5–5.2)
Alkaline Phosphatase: 66 U/L (ref 39–117)
BUN: 21 mg/dL (ref 6–23)
Calcium: 7.7 mg/dL — ABNORMAL LOW (ref 8.4–10.5)
Chloride: 100 mEq/L (ref 96–112)
Creatinine, Ser: 0.69 mg/dL (ref 0.4–1.2)
Creatinine, Ser: 0.71 mg/dL (ref 0.4–1.2)
GFR calc Af Amer: >60 mL/min (ref >60)
Glucose, Bld: 94 mg/dL (ref 70–99)
Potassium: 3.7 mEq/L (ref 3.5–5.1)
Total Bilirubin: 1.6 mg/dL — ABNORMAL HIGH (ref 0.3–1.2)
Total Protein: 5.4 g/dL — ABNORMAL LOW (ref 6.0–8.3)
Total Protein: 6.8 g/dL (ref 6.0–8.3)

## 2011-02-10 LAB — PREPARE FRESH FROZEN PLASMA

## 2011-02-10 LAB — CARDIAC PANEL(CRET KIN+CKTOT+MB+TROPI)
CK, MB: 0.7 ng/mL (ref 0.3–4.0)
CK, MB: 0.9 ng/mL (ref 0.3–4.0)
Relative Index: INVALID (ref 0.0–2.5)
Troponin I: 0.07 ng/mL — ABNORMAL HIGH (ref 0.00–0.06)
Troponin I: 0.14 ng/mL — ABNORMAL HIGH (ref 0.00–0.06)

## 2011-02-10 LAB — GLUCOSE, CAPILLARY
Glucose-Capillary: 115 mg/dL — ABNORMAL HIGH (ref 70–99)
Glucose-Capillary: 116 mg/dL — ABNORMAL HIGH (ref 70–99)
Glucose-Capillary: 121 mg/dL — ABNORMAL HIGH (ref 70–99)
Glucose-Capillary: 81 mg/dL (ref 70–99)

## 2011-02-10 LAB — LACTIC ACID, PLASMA
Lactic Acid, Venous: 0.9 mmol/L (ref 0.5–2.2)
Lactic Acid, Venous: 1 mmol/L (ref 0.5–2.2)

## 2011-02-10 LAB — POCT I-STAT 3, ART BLOOD GAS (G3+)
O2 Saturation: 96 %
TCO2: 23 mmol/L (ref 0–100)
pCO2 arterial: 32 mmHg — ABNORMAL LOW (ref 35.0–45.0)
pH, Arterial: 7.452 — ABNORMAL HIGH (ref 7.350–7.400)
pO2, Arterial: 80 mmHg (ref 80.0–100.0)

## 2011-02-10 LAB — MRSA PCR SCREENING: MRSA by PCR: NEGATIVE

## 2011-02-10 LAB — PROTIME-INR
INR: 1.32 (ref 0.00–1.49)
INR: 1.53 — ABNORMAL HIGH (ref 0.00–1.49)
Prothrombin Time: 16.3 seconds — ABNORMAL HIGH (ref 11.6–15.2)
Prothrombin Time: 34 seconds — ABNORMAL HIGH (ref 11.6–15.2)

## 2011-02-10 LAB — TRANSFUSION REACTION
DAT C3: NEGATIVE
Post RXN DAT IgG: NEGATIVE

## 2011-02-10 LAB — BRAIN NATRIURETIC PEPTIDE: Pro B Natriuretic peptide (BNP): 160 pg/mL — ABNORMAL HIGH (ref 0.0–100.0)

## 2011-02-12 LAB — URINE MICROSCOPIC-ADD ON

## 2011-02-12 LAB — HEPATIC FUNCTION PANEL
ALT: 30 U/L (ref 0–35)
AST: 23 U/L (ref 0–37)
Total Protein: 6.8 g/dL (ref 6.0–8.3)

## 2011-02-12 LAB — CBC
Hemoglobin: 12.3 g/dL (ref 12.0–15.0)
Platelets: 466 10*3/uL — ABNORMAL HIGH (ref 150–400)
RDW: 13.5 % (ref 11.5–15.5)

## 2011-02-12 LAB — POCT I-STAT, CHEM 8
BUN: 18 mg/dL (ref 6–23)
Chloride: 103 mEq/L (ref 96–112)
Potassium: 4.3 mEq/L (ref 3.5–5.1)
Sodium: 133 mEq/L — ABNORMAL LOW (ref 135–145)

## 2011-02-12 LAB — DIFFERENTIAL
Basophils Absolute: 0 10*3/uL (ref 0.0–0.1)
Lymphocytes Relative: 13 % (ref 12–46)
Monocytes Absolute: 0.8 10*3/uL (ref 0.1–1.0)
Neutro Abs: 8.2 10*3/uL — ABNORMAL HIGH (ref 1.7–7.7)

## 2011-02-12 LAB — LIPASE, BLOOD: Lipase: 24 U/L (ref 11–59)

## 2011-02-12 LAB — POCT CARDIAC MARKERS
Myoglobin, poc: 74.6 ng/mL (ref 12–200)
Troponin i, poc: <0.05 ng/mL (ref 0.00–0.09)

## 2011-02-12 LAB — URINALYSIS, ROUTINE W REFLEX MICROSCOPIC
Glucose, UA: NEGATIVE mg/dL
Leukocytes, UA: NEGATIVE
Specific Gravity, Urine: 1.013 (ref 1.005–1.030)
Urobilinogen, UA: 1 mg/dL (ref 0.0–1.0)

## 2011-02-12 LAB — URINE CULTURE

## 2011-02-15 ENCOUNTER — Ambulatory Visit (INDEPENDENT_AMBULATORY_CARE_PROVIDER_SITE_OTHER): Payer: Medicare Other | Admitting: Internal Medicine

## 2011-02-15 ENCOUNTER — Telehealth: Payer: Self-pay | Admitting: Internal Medicine

## 2011-02-15 ENCOUNTER — Encounter: Payer: Self-pay | Admitting: Internal Medicine

## 2011-02-15 ENCOUNTER — Other Ambulatory Visit (INDEPENDENT_AMBULATORY_CARE_PROVIDER_SITE_OTHER): Payer: Medicare Other

## 2011-02-15 DIAGNOSIS — E785 Hyperlipidemia, unspecified: Secondary | ICD-10-CM

## 2011-02-15 DIAGNOSIS — M8080XA Other osteoporosis with current pathological fracture, unspecified site, initial encounter for fracture: Secondary | ICD-10-CM | POA: Insufficient documentation

## 2011-02-15 DIAGNOSIS — Z79899 Other long term (current) drug therapy: Secondary | ICD-10-CM

## 2011-02-15 DIAGNOSIS — M8440XA Pathological fracture, unspecified site, initial encounter for fracture: Secondary | ICD-10-CM

## 2011-02-15 DIAGNOSIS — E039 Hypothyroidism, unspecified: Secondary | ICD-10-CM | POA: Insufficient documentation

## 2011-02-15 DIAGNOSIS — I10 Essential (primary) hypertension: Secondary | ICD-10-CM

## 2011-02-15 LAB — TYPE AND SCREEN
ABO/RH(D): O POS
Antibody Screen: NEGATIVE

## 2011-02-15 LAB — BASIC METABOLIC PANEL
CO2: 29 mEq/L (ref 19–32)
CO2: 30 mEq/L (ref 19–32)
Calcium: 8.2 mg/dL — ABNORMAL LOW (ref 8.4–10.5)
Calcium: 8.3 mg/dL — ABNORMAL LOW (ref 8.4–10.5)
Chloride: 101 mEq/L (ref 96–112)
Creatinine, Ser: 0.64 mg/dL (ref 0.4–1.2)
GFR calc Af Amer: >60 mL/min (ref >60)
GFR calc Af Amer: >60 mL/min (ref >60)
GFR calc Af Amer: >60 mL/min (ref >60)
GFR calc non Af Amer: >60 mL/min (ref >60)
Potassium: 3.6 mEq/L (ref 3.5–5.1)
Potassium: 3.7 mEq/L (ref 3.5–5.1)
Potassium: 3.9 mEq/L (ref 3.5–5.1)
Sodium: 134 mEq/L — ABNORMAL LOW (ref 135–145)
Sodium: 136 mEq/L (ref 135–145)
Sodium: 137 mEq/L (ref 135–145)

## 2011-02-15 LAB — URINALYSIS, ROUTINE W REFLEX MICROSCOPIC
Glucose, UA: NEGATIVE mg/dL
Ketones, ur: NEGATIVE mg/dL
Specific Gravity, Urine: 1.017 (ref 1.005–1.030)
pH: 7 (ref 5.0–8.0)

## 2011-02-15 LAB — COMPREHENSIVE METABOLIC PANEL
ALT: 18 U/L (ref 0–35)
AST: 21 U/L (ref 0–37)
Alkaline Phosphatase: 69 U/L (ref 39–117)
CO2: 30 mEq/L (ref 19–32)
Calcium: 9.8 mg/dL (ref 8.4–10.5)
GFR calc Af Amer: >60 mL/min (ref >60)
GFR calc non Af Amer: >60 mL/min (ref >60)
Glucose, Bld: 115 mg/dL — ABNORMAL HIGH (ref 70–99)
Potassium: 4.4 mEq/L (ref 3.5–5.1)
Sodium: 140 mEq/L (ref 135–145)

## 2011-02-15 LAB — PROTIME-INR
INR: 1.15 (ref 0.00–1.49)
INR: 1.6 — ABNORMAL HIGH (ref 0.00–1.49)
Prothrombin Time: 12.8 seconds (ref 11.6–15.2)
Prothrombin Time: 15.9 seconds — ABNORMAL HIGH (ref 11.6–15.2)

## 2011-02-15 LAB — LIPID PANEL
Cholesterol: 180 mg/dL (ref 0–200)
HDL: 63.2 mg/dL (ref >39.00)
LDL Cholesterol: 83 mg/dL (ref 0–99)
VLDL: 33.8 mg/dL (ref 0.0–40.0)

## 2011-02-15 LAB — HEPATIC FUNCTION PANEL
ALT: 16 U/L (ref 0–35)
Total Protein: 7 g/dL (ref 6.0–8.3)

## 2011-02-15 LAB — CBC
HCT: 24.1 % — ABNORMAL LOW (ref 36.0–46.0)
HCT: 27.5 % — ABNORMAL LOW (ref 36.0–46.0)
HCT: 32.5 % — ABNORMAL LOW (ref 36.0–46.0)
Hemoglobin: 11.2 g/dL — ABNORMAL LOW (ref 12.0–15.0)
Hemoglobin: 14 g/dL (ref 12.0–15.0)
Hemoglobin: 8.2 g/dL — ABNORMAL LOW (ref 12.0–15.0)
Hemoglobin: 9.2 g/dL — ABNORMAL LOW (ref 12.0–15.0)
MCHC: 34.4 g/dL (ref 30.0–36.0)
MCV: 95.1 fL (ref 78.0–100.0)
RBC: 2.53 MIL/uL — ABNORMAL LOW (ref 3.87–5.11)
RBC: 2.89 MIL/uL — ABNORMAL LOW (ref 3.87–5.11)
RBC: 3.44 MIL/uL — ABNORMAL LOW (ref 3.87–5.11)
RBC: 4.37 MIL/uL (ref 3.87–5.11)
WBC: 6 10*3/uL (ref 4.0–10.5)
WBC: 7.9 10*3/uL (ref 4.0–10.5)
WBC: 9.1 10*3/uL (ref 4.0–10.5)

## 2011-02-15 MED ORDER — LISINOPRIL 20 MG PO TABS: 20.0000 mg | ORAL_TABLET | Freq: Every day | ORAL | Status: DC

## 2011-02-15 NOTE — Assessment & Plan Note (Addendum)
Hashimotos with hx goiter - will continue to follow with endo for same (kumar) Will send for prior records from [facility] to review No results found for this basename: TSH

## 2011-02-15 NOTE — Assessment & Plan Note (Signed)
The current statin medical regimen is effective;  continue present plan and medications.

## 2011-02-15 NOTE — Patient Instructions (Signed)
It was good to see you today. Will send for prior records from Dr. Dagoberto Ligas as discussed to review - continue to follow with Dr. Lucianne Muss and your other specialists as we reviewed today Reduce dose of lisinopril as discussed - call when refills are needed. Medications reviewed, no other changes at this time. Test(s) ordered today. Your results will be called to you after review (48-72hours after test completion). If any changes need to be made, you will be notified at that time. Please schedule followup in 4 months to monitor blood pressure, cholesterol and medications, call sooner if problems.

## 2011-02-15 NOTE — Telephone Encounter (Signed)
Please call patient - normal results (cholesterol and LFTs). No medication changes recommended. Thanks.

## 2011-02-15 NOTE — Progress Notes (Signed)
Subjective:    Patient ID: Lindsey Erickson, female    DOB: [birth year], 77 y.o.   MRN: [id number]  HPI New patient to me and our division , here to establish care.  Previously followed with [facility] who has retired.  Reviewed chronic medical issues today: Hypothyroid  -follows with endo for same. hx hashimotos and goiter - s/p bx x 3 - benign - last in 07/2002- the patient reports compliance with medication(s) as prescribed. Denies adverse side effects. No weight or skin changes. Dyslipidemia - on statin for same since MI 2004 - the patient reports compliance with medication(s) as prescribed. Denies adverse side effects. hypertention - the patient reports compliance with medication(s) as prescribed. Denies adverse side effects. Osteoporosis - hx fx following accidental fall x2 - takes actonel with vit d + calcium as prescribed. Denies adverse side effects from same.  Past Medical History  Diagnosis Date  . Hypothyroidism   . Cholecystitis   . Kidney stones   . Osteoporosis   . Arthritis   . Squamous cell carcinoma   . HYPERTENSION 06/22/2010  . Hyperlipidemia    Family History  Problem Relation Age of Onset  . Allergies Father   . Cancer Father   . Coronary artery disease Mother     History  Substance Use Topics  . Smoking status: Never Smoker   . Smokeless tobacco: Not on file   Comment: Married, 2 children. Retired from [employer] [address]  . Alcohol Use: No    Review of Systems Constitutional: Negative for fever.  Respiratory: Negative for cough and shortness of breath.   Cardiovascular: Negative for chest pain.  Gastrointestinal: Negative for abdominal pain.  Musculoskeletal: Negative for gait problem.  Skin: Negative for rash.  Neurological: Negative for dizziness.  No other specific complaints in a complete review of systems (except as listed in HPI above).     Objective:   Physical Exam BP 122/72  Pulse 70  Temp(Src) 98.2 F (36.8 C) (Oral)  Ht 5\' 7"  (1.702 m)   Wt 176 lb 6.4 oz (80.015 kg)  BMI 27.63 kg/m2 Constitutional: She is oriented to person, place, and time. She appears well-developed and well-nourished. No distress.  HENT:  Head: Normocephalic and atraumatic.  Nose: Nose normal.  Mouth/Throat: Oropharynx is clear and moist. No oropharyngeal exudate.  Eyes: Conjunctivae and EOM are normal. Pupils are equal, round, and reactive to light. No scleral icterus.  Neck: Normal range of motion. Neck supple. No JVD present. No thyromegaly present.  Cardiovascular: Normal rate, regular rhythm and normal heart sounds.  No murmur heard. Pulmonary/Chest: Effort normal and breath sounds normal. No respiratory distress. She has no wheezes.  Abdominal: Soft. Bowel sounds are normal. She exhibits no distension. There is no tenderness.  Musculoskeletal: Normal range of motion. She exhibits no edema.  Neurological: She is alert and oriented to person, place, and time. No cranial nerve deficit. Coordination normal.  Skin: Skin is warm and dry. No rash noted. No erythema.  Psychiatric: She has a normal mood and affect. Her behavior is normal. Judgment and thought content normal.       Lab Results  Component Value Date   WBC 6.0 06/09/2010   HGB 14.0 06/09/2010   HCT 40.7 06/09/2010   PLT 256 06/09/2010   ALT 15 06/09/2010   AST 20 06/09/2010   NA 135 06/09/2010   NA 136 06/09/2010   K 3.8 06/09/2010   K 4.0 06/09/2010   CL 100 06/09/2010   CL  100 06/09/2010   CREATININE 0.64 06/09/2010   CREATININE 0.63 06/09/2010   BUN 15 06/09/2010   BUN 15 06/09/2010   CO2 28 06/09/2010   CO2 25 06/09/2010   INR 1.32 02/25/2010      Assessment & Plan:  See problem list. Medications, prior hospital records and labs reviewed today. Time spent with the patient today 45 minutes, greater than 50% time spent counseling patient on hypertension, cholesterol and medication review. Also review of prior records in hospital system and brought with the patient today.

## 2011-02-15 NOTE — Assessment & Plan Note (Signed)
Continue bisphos and Calcium + D fx hx accidental fall in 12/2000 (elbow R) and 05/2008 (foot L)

## 2011-02-15 NOTE — Assessment & Plan Note (Signed)
occasionak symptoms hypoperfusion when SBP <100 - reduce dose ACEI now (lisinopril 40 to 20qd) BP Readings from Last 3 Encounters:  02/15/11 122/72

## 2011-02-16 NOTE — Telephone Encounter (Signed)
Pt Notified with lab results....02/16/11@9 :31LMB

## 2011-02-23 ENCOUNTER — Other Ambulatory Visit: Payer: Self-pay | Admitting: Dermatology

## 2011-02-23 ENCOUNTER — Encounter: Payer: Self-pay | Admitting: Internal Medicine

## 2011-04-09 NOTE — Cardiovascular Report (Signed)
NAME:  Lindsey Erickson, Lindsey Erickson NO.:  [id number]   MEDICAL RECORD NO.:  [id number]                   PATIENT TYPE:  INP   LOCATION:  2038                                 FACILITY:  [facility]   PHYSICIAN:  Lesleigh Noe, M.D.            DATE OF BIRTH:  [birth year]   DATE OF PROCEDURE:  01/07/2003  DATE OF DISCHARGE:                              CARDIAC CATHETERIZATION   INDICATION FOR PROCEDURE:  Non-Q-wave myocardial infarction.   PROCEDURES:  1. Left heart catheterization.  2. Selective coronary angiography.  3. Left ventriculography.   DESCRIPTION OF PROCEDURE:  After informed consent, a 6 French sheath was  placed in the right femoral artery using the modified Seldinger technique.  A 6 [place] AT multipurpose catheter was used for hemodynamic recordings,  left ventriculography, and selective left and right coronary angiography.  The patient tolerated the procedure without significant complications.   RESULTS:   HEMODYNAMIC DATA:  1. Aortic pressure  105/68.  2. Left ventricular pressure 101/14.   LEFT VENTRICULOGRAPHY:  The left ventricle is normal in size.  Contractility  is diminished.  Mid- to distal inferior wall severe hypokinesis is noted.  EF is noted to be 40-45.   CORONARY ANGIOGRAPHY:  1. Left main coronary artery:  Widely patent.  2. Left anterior descending coronary artery:  The left anterior descending     coronary artery is a large vessel.  It wraps around the left ventricular     apex.  No significant obstruction is noted in the LAD.  Two small     diagonal branches also arise from it.  3. Circumflex artery:  The circumflex coronary artery is large.  It gives     origin to three obtuse marginal branches.  In the mid vessel before the     origin of the small second obtuse marginal, there is an area of     hypodensity where there is obstruction of the artery by 30-40%.  This     probably represents a region of recanalized thrombus  that likely     represents the culprit lesion for the patient's acute ischemic event.  It     appears that she has successfully recanalized this and has no significant     high-grade obstruction present.  4. Ramus branch:  There is a large ramus branch that arises from the distal     left main.  No significant obstruction is noted.  Narrowed luminal     irregularities are seen.  5. Right coronary artery:  The right coronary artery is large.  The mid     vessel contains an eccentric 50-70% lesion depending on the view chosen.     The distal ______ supplies the proximal inferior wall and is not the     region that exhibits the wall motion abnormality.  The distal vessel is     free of any significant obstruction.  CONCLUSIONS:  1. Acute coronary syndrome secondary to thrombosis followed by spontaneous     recanalization of the mid-circumflex.  There is residual less than 50%     stenosis in the mid-circumflex with superimposed thrombus.  2. Moderate mid-right coronary artery disease, 50-70%.  The left anterior     descending is widely patent.  3. Left ventricular dysfunction with mid- to distal inferior wall severe     hypokinesis and an ejection fraction of 40%.   PLAN:  Medical therapy including Integrilin x24 additional hours, add  Plavix.  Aggressive risk factor modification.  If recurrent angina, consider  PCI on both the circumflex and RCA.                                               Lesleigh Noe, M.D.    HWS/MEDQ  D:  01/07/2003  T:  01/07/2003  Job:  [id number]   cc:   Alfonse Alpers. Dagoberto Ligas, M.D.  [address], [address]  [place]  [place] [address]  Fax: [phone]   [facility]iac Catheter[facility]

## 2011-04-09 NOTE — Consult Note (Signed)
NAME:  Lindsey Erickson, Lindsey Erickson NO.:  [id number]   MEDICAL RECORD NO.:  [id number]                   PATIENT TYPE:  INP   LOCATION:  2038                                 FACILITY:  [facility]   PHYSICIAN:  Cassell Clement, M.D.              DATE OF BIRTH:  07-Oct-1932   DATE OF CONSULTATION:  DATE OF DISCHARGE:                                   CONSULTATION   HISTORY OF PRESENT ILLNESS:  This is a 77 year old married Caucasian female  who was admitted last evening with left arm pain and left chest pain.  Shortly after she was in bed ready to go to sleep last evening, she  developed a deep, aching discomfort in her left arm.  The pain then spread  to her left chest.  She took six baby aspirin.  She took her blood pressure  on her machine, and it was elevated at 187/97.  She asked her husband to  check her blood pressure, and he found that it was up to 212/130 range when  he took it.  She felt nauseated and felt the need to burp.  The family noted  that she had pallor, and that the skin appeared to be clammy.  She came to  the emergency room.  After arrival, she was given IV nitroglycerin with some  relief.  She was given IV Phenergan which also seemed to relieve the pain  temporarily.  However, the discomfort has persisted off and on over the  course of the day.  Her initial enzymes in the emergency room showed CK-MB  of 2.8 and troponin I of 0.15.  Subsequent set 8 hours later shows a rise in  CK-MB to 13, and a rise in troponin I to 2.21.  Her electrocardiogram in the  emergency room was normal.  The patient has a past history of  hypercholesterolemia.  Cholesterols were in the 340 range prior to her  agreeing to finally go on statin therapy several years ago.  She is now on  Zocor 40 mg a day.  She has a history of hypertension and has been on  lisinopril 10 mg a day.  She is not diabetic.  She does not get any regular  exercise.   FAMILY HISTORY:  Her mother  died of congestive heart failure and had a  myocardial infarction and died at age 51.  Dr. Lyn Records was her  mother's doctor.  The patient's father died at 44 of cancer.  She has a  sister who has had heart problems as well.  The patient is married and has  two daughters.  She works at [employer] as a Conservation officer, nature part time.  It is a stressful job, and she is considering quitting.   SURGICAL HISTORY:  Previous surgery included appendicitis and removal of  kidney stone.   REVIEW OF SYSTEMS:  She has a history of Hashimoto's thyroiditis  in 1981.  Otherwise, review of systems unremarkable.  She has not had any history of  GI bleeding.   PHYSICAL EXAMINATION:  VITAL SIGNS:  Blood pressure 126/100, pulse 116 and  regular.  Respirations are normal.  Color is good.  She is in no acute  distress, but still complaining of some dull chest pain.  HEENT:  On head and neck exam, pupils are equal and reactive.  Sclerae are  clear.  Mouth and pharynx are normal.  Jugular venous pressure normal.  Carotids normal.  No bruits.  The chest is clear.  HEART:  Reveals tachycardia.  There is no S4 or gallop.  There is no murmur,  click or rub.  ABDOMEN:  Soft and nontender.  EXTREMITIES:  Good pulses, no edema.   EKG:  Repeat EKG done at 4:37 p.m. shows nonspecific ST-T wave changes are  now present.   IMPRESSION:  1. Subendocardial non-Q wave myocardial infarction approximately 16 hours     ago.  2. Hypercholesterolemia.  3. Hypertensive cardiovascular disease.   DISPOSITION:  The patient is already on IV heparin and IV nitroglycerin  which will be continued.  We are going to increase her beta-blocker to 50 mg  b.i.d. for better rate control.  We will add Xanax for anxiety.  Anticipate  cardiac catheterization in the a.m.  The patient has requested Dr. Verdis Prime do the cath and assume care since he has treated other family members.                                                Cassell Clement, M.D.    TB/MEDQ  D:  01/06/2003  T:  01/06/2003  Job:  [id number]   cc:   Lesleigh Noe, M.D.  [address]  [place]  [place] [address]  Fax: [phone]   Reather Littler, M.D.  [address], [address]  [place]  [place] [address]  Fax: [phone]   Alfonse Alpers. Dagoberto Ligas, M.D.  [address]  [place]  [place] [address]  Fax: [phone]

## 2011-04-09 NOTE — Discharge Summary (Signed)
   NAME:  Lindsey Erickson, Lindsey Erickson                        ACCOUNT NO.:  [id number]   MEDICAL RECORD NO.:  [id number]                   PATIENT TYPE:  INP   LOCATION:  2038                                 FACILITY:  [facility]   PHYSICIAN:  Alfonse Alpers. Dagoberto Ligas, M.D.             DATE OF BIRTH:  24-Apr-1932   DATE OF ADMISSION:  01/06/2003  DATE OF DISCHARGE:  01/08/2003                                 DISCHARGE SUMMARY   HISTORY:  This is a 77 year old woman who presents with a history of chest  pain.  Her pain began in the left arm and then she had left sided chest  pain.  She presented to the emergency room and was found to have positive  enzymes.   She has a history of dyslipidemia however she has been reluctant to take  large doses of Zocor.   She also has a history of hypothyroidism which has been controlled.   PHYSICAL EXAMINATION:  Physical exam on admission revealed a well-developed  woman who was in no acute distress.  LUNGS:  Her lungs were clear.  CARDIOVASCULAR:  Cardiovascular examination revealed her rhythm to be  regular.   IMPRESSION ON ADMISSION:  1. Myocardial ischemia.  2. Hypothyroidism.  3. Dyslipidemia.   HOSPITAL COURSE:  The patient was admitted to the hospital.  She was seen by  cardiology service and they considered that she had a subendocardial non Q  wave myocardial infarction.  She was started on heparin and IV  nitroglycerin.  She improved, did not have any more episodes of chest pain.  She was scheduled for cardiac catheterization which was performed and this  showed moderate disease 40% to 60%.  She was then discharged.   IMPRESSION ON DISCHARGE:  1. Subendocardial myocardial infarction.  2. Dyslipidemia.  3. Hypothyroidism.  4. History of hypertension.   DISCHARGE MEDICATIONS:  1. She will have nitroglycerin to take p.r.n.  2. Enteric coated aspirin.  3. Synthroid 0.088 mg one daily.  4. Zocor 40 mg one daily.  5. Zestril 10 mg daily.    FOLLOW UP:   She will be seen in the office in two weeks.   CONDITION ON DISCHARGE:  Improved.                                                Alfonse Alpers. Dagoberto Ligas, M.D.    CGG/MEDQ  D:  01/08/2003  T:  01/08/2003  Job:  [id number]

## 2011-04-09 NOTE — Discharge Summary (Signed)
NAMEVEANNA, DOWER NO.:  [id number]   MEDICAL RECORD NO.:  [id number]          PATIENT TYPE:  INP   LOCATION:  3731                         FACILITY:  [facility]   PHYSICIAN:  Lyn Records, M.D.   DATE OF BIRTH:  November 01, 1932   DATE OF ADMISSION:  01/25/2007  DATE OF DISCHARGE:  01/26/2007                               DISCHARGE SUMMARY   ADMISSION DIAGNOSIS:  Chest pain consistent with unstable angina.   DISCHARGE DIAGNOSES:  1. Chest pain consistent with unstable angina.  Myocardial infarction      ruled out with negative cardiac enzymes x3, questionable etiology -      musculoskeletal versus pericarditis  2. Left ventricular systolic function at the lower limits of normal      with an ejection fraction of 50% via 2-D echocardiogram on January 25, 2007.  3. Coronary artery disease, status post non-Q-wave myocardial      infarction in February 2004, with medical management.  4. Hypertension.  5. Dyslipidemia.  6. Hashimoto's thyroiditis.  7. Stress Cardiolite negative for inducible ischemia or infarct with      an ejection fraction of 64%, July 2006.   CONSULTATIONS:  None.   PROCEDURES:  None during this admission.   HOSPITAL COURSE:  Ms. Brazell is a 77 year old Caucasian female with a  known history of coronary artery disease, status post non-Q-wave MI in  February 2004, with medical management.  She was admitted to the [facility] yesterday with a chief complaint of chest pain, consistent  with unstable angina.  Chest pain worsened with deep inspiration and was  not relieved with sublingual nitroglycerin.  The patient was placed on  IV nitroglycerin and IV heparin upon arrival without improvement of her  chest pain.  Serial cardiac enzymes were negative x3, with a peak  troponin of 0.05.  A 12-lead EKG revealed normal sinus rhythm with a  ventricular rate of 75 beats per minute without evidence of acute ST-  segment or T-wave changes.  A  chest x-ray revealed no acute  cardiopulmonary process.  A STAT 2-D echocardiogram revealed an LV  systolic function at the lower limits of normal with an EF of 50%.  There was hypokinesis of the basal inferior wall and mild MR.  On this morning, the patient underwent a stress Cardiolite that was  negative for inducible ischemia or infarction with an EF of 67%.  Normal  LV wall motion was noted.  On yesterday, the patient was given IV  Decadron 6 mg x1, with improvement of pleuritic chest pain.  For the  remainder of the admission, the patient had a complaint of mild chest  pain without radiation.  There were no other associated symptoms.  IV  nitroglycerin was discontinued.  The patient's home medications were  continued during this admission.  She was discharged to home in stable  condition on January 26, 2007, with improvement in her chest discomfort.   Upon discharge, she was advised to take Advil 400 mg twice daily for  four to six days and continue her  home medications.   LABORATORY DATA:  Sodium 137, potassium 3.9, chloride 105, CO2 25,  glucose 104, BUN 27, creatinine 1.01.  Total bilirubin 1, alkaline  phosphatase 51, AST 22, ALT 19, total protein 6.8, serum albumin 3.6.  Serial cardiac enzymes:  CK total 68, 72, and 73.  CK-MB 0.8, and 0.9  x2.  Troponin I 0.01, 0.05, and 0.03.  BNP less than 30.  PT 14, INR  1.1, PTT 30.  White blood count 7.3, hemoglobin 12.8, hematocrit 38,  platelets 268,000.  Heparin level 1.38 and 0.68, respectively.   X-RAYS:  As stated in the hospital course.   ELECTROCARDIOGRAMS:  As stated in the hospital course.   CONDITION ON DISCHARGE:  Stable.   DISCHARGE MEDICATIONS:  1. Advil 400 mg twice daily for 4-6 days.  This represents an addition      to the patient's home medical regimen.  2. Coreg 12.5 mg twice daily.  3. Fish oil 1,000 mg twice daily.  4. MSM 2 tablets daily.  5. Multivitamin daily.  6. Calcium plus vitamin D 600 mg twice  daily.  7. Vitamin C 500 mg twice daily.  8. Aspirin 81 mg daily.  9. Zestril 40 mg daily.  10.Crestor 5 mg daily.  11.Synthroid 88 mcg daily.  12.Sublingual nitroglycerin 0.4 mg as needed for chest pain.   DISCHARGE INSTRUCTIONS:  1. Continue a low-salt, low-fat, and low-cholesterol diet.  2. No restrictions of on activity.   FOLLOWUP ARRANGEMENTS:  A follow-p appointment has been scheduled with  Dr. Verdis Prime on February 06, 2007 at 2:15 p.m.  The patient is scheduled  to call [phone], if she is unable to keep the scheduled appointment.      Tylene Fantasia, [place]      Lyn Records, M.D.  Electronically Signed    RDM/MEDQ  D:  01/26/2007  T:  01/26/2007  Job:  [id number]   cc:   Alfonse Alpers. Dagoberto Ligas, M.D.

## 2011-04-09 NOTE — H&P (Signed)
Lindsey Erickson NO.:  [id number]   MEDICAL RECORD NO.:  [id number]          PATIENT TYPE:  INP   LOCATION:  3731                         FACILITY:  [facility]   PHYSICIAN:  Lyn Records, M.D.   DATE OF BIRTH:  1931-12-20   DATE OF ADMISSION:  01/25/2007  DATE OF DISCHARGE:                              HISTORY & PHYSICAL   PRIMARY CARE PHYSICIAN:  Dr. Dagoberto Ligas.   CARDIOLOGIST:  Verdis Prime, M.D.   REASON FOR ADMISSION:  Chest pain consistent with unstable angina.   HISTORY OF PRESENT ILLNESS:  Lindsey Erickson is a 77 year old Caucasian  female with a known history of coronary artery disease status post non-Q-  wave MI in February 2004 with medical management.  She also has a  history of hypertension, dyslipidemia, and Hashimoto's thyroiditis.  The  patient complained of nonexertional, to the left of her sternum,  intermittent chest pain since Sunday.  The chest pain was sudden in  onset and was described as an ache lasting greater than 12 hours before  ending and recurring.  She denies radiation.  She also denies shortness  of breath, diaphoresis, nausea, vomiting or dizziness.  The chest pain  worsens with deep inspiration.  She admits to soreness upon palpation.  She has a positive history of previous chest pain episodes; however,  states that this is dissimilar in quality or character to previous chest  pain episodes.  On this morning, she presented to her primary care  physician's office for further evaluation of the chest pain.  A 12-lead  EKG was obtained and revealed normal sinus rhythm with a ventricular  rate of 75 beats per minute.  There was no evidence of acute ST-  segment/T-wave changes.  Chest x-ray was also obtained and was negative  for acute disease.  The patient was directly admitted to [facility] for evaluation of her chest pain and to rule out MI.  She was  started on IV nitroglycerin without pain improvement.  She is also on IV  heparin.   PAST MEDICAL HISTORY:  1. Coronary artery disease, status post non-Q-wave MI in February 2004      with medical management.  2. Hypertension.  3. Hashimoto's thyroiditis.  4. Stress Cardiolite negative for inducible ischemia or infarct with      an EF of 64% in July 2006.  There were no regional wall motion      abnormalities.   ALLERGIES:  1. CODEINE.  2. DARVOCET-N 100.  3. PERCOCET.   MEDICATIONS:  1. Fish oil 1000 mg twice daily.  2. MSM 2 tablets twice daily.  3. Multivitamin daily.  4. Calcium plus vitamin D 600 mg twice daily.  5. Aspirin 81 mg daily.  6. Coreg 12.5 mg twice daily.  7. Zestril 40 mg daily.  8. Crestor 5 mg daily.  9. Synthroid 88 mcg daily.  10.Vitamin C 500 mg twice daily.   FAMILY HISTORY:  1. Mother deceased, CHF.  2. Father deceased, cancer.   SOCIAL HISTORY:  Married with two adult children.  Lives with her  husband in  [place].  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  She denies tobacco,  alcohol or illicit drug use.   REVIEW OF SYSTEMS:  All other systems reviewed are negative other than  what is stated in the HPI.   PHYSICAL EXAM:  GENERAL:  A 77 year old female, pleasant and  cooperative, NAD.  VITALS:  Temperature 96.5, blood pressure 155/82, pulse 78, respirations  20, O2 saturations 100% over room air.  HEENT:  Unremarkable.  NECK:  Supple without JVD or carotid bruits, bilaterally.  Carotid  upstrokes 2+.  No lymphadenopathy or thyromegaly.  PULMONARY:  Breath sounds are equal and clear to auscultation  bilaterally.  No use of accessory muscles.  CV:  Regular rate and rhythm.  Normal S1-S2 without murmurs, gallops,  clicks or rubs.  ABDOMEN:  Soft, nontender, nondistended with active bowel sounds.  No  masses, organomegaly or bilateral bruits.  EXTREMITIES:  No peripheral edema.  DP pulses 2+/2, bilaterally.  SKIN:  Warm and dry without rashes or lesions.   NEURO:  No focal motor or sensory deficits.  PSYCH:  Normal mood and affect.   LABORATORY DATA:  White blood count 7.3, hemoglobin 12.8, hematocrit  38.0, platelets 268,000.  PT 14.0, INR 1.1, PTT 30.  CMET, BNP, chest x-  ray results and serial cardiac enzymes are pending.  A 12-lead EKG  normal sinus rhythm with a ventricular rate of 75 beats per minute with  nonspecific ST-segment/T-wave changes.  There was no evidence of  ischemia.   ASSESSMENT:  1. Chest pain consistent with unstable angina.  2. Coronary artery disease status post non-Q-wave myocardial      infarction February 2004 with medical management.  3. Hypertension.  4. Dyslipidemia.  5. Stress Cardiolite negative for inducible ischemia or infarct with      an ejection fraction of 64% in July 2006.  6. Hashimoto's thyroiditis.   PLAN:  1. The patient was directly admitted to the [facility] with a      diagnosis of unstable angina to a cardiac telemetry unit under the      service of Dr. Verdis Prime.  2. Rule out MI.  Cardiac panel q.8h. x3.  3. Rule out pericarditis with a STAT 2-D echocardiogram.  4. Continue home medications.  Discontinue Lopressor 25 mg twice      daily.  5. Up ad lib.  6. Vitals q.4h.  7. Diet 2 grams sodium fat modified diet.  8. The patient was seen, interviewed and examined by Dr. Verdis Prime      who participated in the medical decision making and plan of care.  9. Further measures per Dr. Verdis Prime.      Tylene Fantasia, [place]      Lyn Records, M.D.  Electronically Signed    RDM/MEDQ  D:  01/25/2007  T:  01/25/2007  Job:  [id number]   cc:   Alfonse Alpers. Dagoberto Ligas, M.D.

## 2011-04-09 NOTE — H&P (Signed)
NAME:  Lindsey Erickson, Lindsey Erickson                        ACCOUNT NO.:  [id number]   MEDICAL RECORD NO.:  [id number]                   PATIENT TYPE:  INP   LOCATION:  2038                                 FACILITY:  [facility]   PHYSICIAN:  Reather Littler, M.D.                    DATE OF BIRTH:  Sep 03, 1932   DATE OF ADMISSION:  01/06/2003  DATE OF DISCHARGE:                                HISTORY & PHYSICAL   CHIEF COMPLAINT:  Chest pain.   HISTORY OF PRESENT ILLNESS:  This is a 77 year old, Caucasian female who  came to the emergency room with onset of left upper arm pain at midnight  last night.  This apparently woke her up from sleep.  She had an aching  sensation in the left arm which has not worsened by any movement, pressure  or associated with any stiffness.  She subsequently started having  substernal aching pain which she described as a dull pain.  This did not  radiate to her neck, but a little bit to the right side.  She had some  associated nausea, but no shortness of breath or palpitations.  She  apparently had a blood pressure at home between 180-212 on two occasions and  came to the emergency room to be evaluated.  On evaluation in the ER, the  patient had some decrease in her chest pain and nitroglycerin was not tried.  However, she felt that while coming into the emergency room, her pain was  worse while trying to walk or move around.  The patient did not have any  vomiting or dizziness.  Her blood pressure on arrival to the emergency room  was 194/97.   MEDICATIONS:  1. Restoril 10 mg daily.  2. Synthroid 88 mcg daily.  3. Zocor 40 mg daily.   ALLERGIES:  Intolerant to CODEINE and DARVOCET.   FAMILY HISTORY:  Positive for MI in mother who was over 40 years old.  Her  sister has unknown heart problems with positive history of hypertension, but  no diabetes.   SOCIAL HISTORY:  She is married and a nonsmoker.   REVIEW OF SYMPTOMS:  ENDOCRINE:  The patient has had  hypercholesterolemia  for several years, on treatment for four years.  She has had no diabetes.  CARDIOPULMONARY:  Her blood pressure has been treated with medication for  about two years.  She has not had any previous episodes of chest pain  similar to this, although she says that she has had mild episodes with more  on the right side of the chest and more atypical.  She has not had any  history of asthma or shortness of breath.  GASTROINTESTINAL:  No history of  GI problems except from reflux.  In 1998, she had palpitations which were  relieved by reducing her Synthroid dose.   PHYSICAL EXAMINATION:  GENERAL:  The patient is alert and in  no acute  distress.  VITAL SIGNS:  Pulse 118 per minute, blood pressure 130/73, temperature  normal.  HEENT:  Eyes externally normal.  Fundi not abnormal with normal blood  vessels.  ENT exam appears normal.  NECK:  She has moderate thyroid enlargement especially on the right side  which is smooth.  No lymphadenopathy.  Carotids are normal.  HEART:  Heart sounds normal.  No chest wall tenderness.  LUNGS:  Clear.  ABDOMEN:  No distention, mass or tenderness.  No bruit or abnormal  pulsation.  GENITALIA:  Deferred.  EXTREMITIES:  No edema or skin rash.  Decreased pedal pulses.  Normal  femoral and popliteal pulses.   LABORATORY DATA AND X-RAY FINDINGS:  EKG normal.  Labs are all normal except  for troponin of 0.15.   ASSESSMENT/PLAN:  The patient has chest pain suggestive of angina, although  her electrocardiogram is normal.  She does have high risk factors and will  need full cardiac evaluation.  Currently, the patient is on nitroglycerin  and heparin that was just started and this will be continued.                                               Reather Littler, M.D.    AK/MEDQ  D:  01/06/2003  T:  01/06/2003  Job:  [id number]

## 2011-06-15 ENCOUNTER — Telehealth: Payer: Self-pay | Admitting: Internal Medicine

## 2011-06-15 ENCOUNTER — Ambulatory Visit (INDEPENDENT_AMBULATORY_CARE_PROVIDER_SITE_OTHER): Payer: Medicare Other | Admitting: Internal Medicine

## 2011-06-15 ENCOUNTER — Encounter: Payer: Self-pay | Admitting: Internal Medicine

## 2011-06-15 ENCOUNTER — Other Ambulatory Visit (INDEPENDENT_AMBULATORY_CARE_PROVIDER_SITE_OTHER): Payer: Medicare Other

## 2011-06-15 DIAGNOSIS — E785 Hyperlipidemia, unspecified: Secondary | ICD-10-CM

## 2011-06-15 DIAGNOSIS — E039 Hypothyroidism, unspecified: Secondary | ICD-10-CM

## 2011-06-15 DIAGNOSIS — I10 Essential (primary) hypertension: Secondary | ICD-10-CM

## 2011-06-15 LAB — TSH: TSH: 0.54 u[IU]/mL (ref 0.35–5.50)

## 2011-06-15 MED ORDER — NITROGLYCERIN 0.4 MG SL SUBL: 0.4000 mg | SUBLINGUAL_TABLET | SUBLINGUAL | Status: DC | PRN

## 2011-06-15 NOTE — Telephone Encounter (Signed)
Please call patient: tests show normal thyroid results. No treatment changes recommended. Please mail copy to pt and fax copy to Dr. Lucianne Muss (her endo) per pt request at [facility] - thanks  Lab Results  Component Value Date   TSH 0.54 06/15/2011

## 2011-06-15 NOTE — Assessment & Plan Note (Signed)
Hashimotos with hx goiter - will continue to follow with endo for same (kumar) Recheck now and send to endo  Lab Results  Component Value Date   TSH 0.88 08/26/2010

## 2011-06-15 NOTE — Telephone Encounter (Signed)
Called pt was told she was not home left msg with gentleman for her to return call concerning labs...06/15/11@4 :48pm/LMB

## 2011-06-15 NOTE — Assessment & Plan Note (Signed)
occasional symptoms hypoperfusion when SBP <100 -  previously reduce dose ACEI 01/2011 (lisinopril 40 to 20qd) which helped symptoms but BP uncontrolled Resumed ACEI 40mg  qd - med list updated today BP Readings from Last 3 Encounters:  06/15/11 132/80  02/15/11 122/72

## 2011-06-15 NOTE — Patient Instructions (Signed)
It was good to see you today. Medications reviewed, no changes at this time.  call when refills are needed. Test(s) ordered today. Your results will be called to you after review (48-72hours after test completion). If any changes need to be made, you will be notified at that time. Will also send copy of labs to you and Dr. Lucianne Muss Please schedule followup in 6 months to monitor blood pressure, cholesterol and medications, call sooner if problems.

## 2011-06-15 NOTE — Progress Notes (Signed)
  Subjective:    Patient ID: Lindsey Erickson, female    DOB: 28-Mar-1932, 77 y.o.   MRN: [id number]  HPI here for follow up - Reviewed chronic medical issues today:  Hypothyroid  -follows with endo for same. hx hashimotos and goiter - s/p bx x 3 - benign - last in 07/2002- the patient reports compliance with medication(s) as prescribed. Denies adverse side effects. No weight or skin changes.  Dyslipidemia - on statin for same since MI 2004 - the patient reports compliance with medication(s) as prescribed. Denies adverse side effects.  hypertention - the patient reports compliance with medication(s) as prescribed. Denies adverse side effects.  Osteoporosis - hx fx following accidental fall x2 - takes actonel with vit d + calcium as prescribed. Denies adverse side effects from same.  Considering R TKR with ortho - ongoing synvisc injections for pain control, no swelling, no nocturnal pain  Past Medical History  Diagnosis Date  . Hypothyroidism   . Cholecystitis 02/2010  . Kidney stones   . Osteoporosis   . Squamous cell carcinoma   . Hyperlipidemia   . Arthritis   . HYPERTENSION    Review of Systems Constitutional: Negative for fever.  Respiratory: Negative for cough and shortness of breath.   Cardiovascular: Negative for chest pain.  Neurological: Negative for dizziness.     Objective:   Physical Exam  BP 132/80  Pulse 74  Temp(Src) 97.9 F (36.6 C) (Oral)  Ht 5\' 7"  (1.702 m)  Wt 179 lb 1.9 oz (81.248 kg)  BMI 28.05 kg/m2  SpO2 99% Constitutional: She is oriented to person, place, and time. She appears well-developed and well-nourished. No distress.  Neck: Normal range of motion. Neck supple. No JVD present. No thyromegaly present.  Cardiovascular: Normal rate, regular rhythm and normal heart sounds.  No murmur heard. Pulmonary/Chest: Effort normal and breath sounds normal. No respiratory distress. She has no wheezes.  Abdominal: Soft. Bowel sounds are normal. She exhibits  no distension. There is no tenderness.  Musculoskeletal: Normal range of motion. She exhibits no edema.  Neurological: She is alert and oriented to person, place, and time. No cranial nerve deficit. Coordination normal.  Psychiatric: She has a normal mood and affect. Her behavior is normal. Judgment and thought content normal.       Lab Results  Component Value Date   WBC 6.0 06/09/2010   HGB 14.0 06/09/2010   HCT 40.7 06/09/2010   PLT 256 06/09/2010   CHOL 180 02/15/2011   TRIG 169.0* 02/15/2011   HDL 63.20 02/15/2011   ALT 16 02/15/2011   AST 20 02/15/2011   NA 138 08/26/2010   K 4.1 08/26/2010   CL 100 06/09/2010   CL 100 06/09/2010   CREATININE 0.6 08/26/2010   BUN 24* 08/26/2010   CO2 28 06/09/2010   CO2 25 06/09/2010   TSH 0.88 08/26/2010   INR 1.32 02/25/2010   HGBA1C 5.7 08/26/2010      Assessment & Plan:  See problem list. Medications, prior hospital records and labs reviewed today.

## 2011-06-15 NOTE — Assessment & Plan Note (Signed)
The current statin medical regimen is effective;  continue present plan and medications.  Lab Results  Component Value Date   LDLCALC 83 02/15/2011

## 2011-06-16 NOTE — Telephone Encounter (Signed)
Notified pt with results, also mail & fax copy to pt and Dr. Lucianne Muss per her request...06/16/11@10 :57am/LMB

## 2011-07-16 ENCOUNTER — Other Ambulatory Visit: Payer: Self-pay | Admitting: *Deleted

## 2011-07-16 MED ORDER — CARVEDILOL PHOSPHATE ER 40 MG PO CP24: 40.0000 mg | ORAL_CAPSULE | Freq: Every day | ORAL | Status: DC

## 2011-07-16 NOTE — Telephone Encounter (Signed)
Pt drop off form needing new rx for Coreg CR 40mg  take 1 daily # [facility]. Was previously rx by Dr. Dagoberto Ligas no longer see him. [facility] to [facility]....07/16/11@12 :07pm/LMB

## 2011-09-09 ENCOUNTER — Other Ambulatory Visit: Payer: Self-pay | Admitting: Internal Medicine

## 2011-09-09 DIAGNOSIS — R911 Solitary pulmonary nodule: Secondary | ICD-10-CM

## 2011-09-13 ENCOUNTER — Other Ambulatory Visit: Payer: Medicare Other

## 2011-10-07 ENCOUNTER — Other Ambulatory Visit: Payer: Self-pay | Admitting: Dermatology

## 2011-10-08 ENCOUNTER — Other Ambulatory Visit: Payer: Self-pay | Admitting: *Deleted

## 2011-10-08 DIAGNOSIS — I10 Essential (primary) hypertension: Secondary | ICD-10-CM

## 2011-10-08 MED ORDER — RISEDRONATE SODIUM 35 MG PO TABS: 35.0000 mg | ORAL_TABLET | ORAL | Status: DC

## 2011-10-08 MED ORDER — ROSUVASTATIN CALCIUM 10 MG PO TABS: 10.0000 mg | ORAL_TABLET | Freq: Every day | ORAL | Status: DC

## 2011-10-08 MED ORDER — NITROGLYCERIN 0.4 MG SL SUBL: 0.4000 mg | SUBLINGUAL_TABLET | SUBLINGUAL | Status: DC | PRN

## 2011-10-08 MED ORDER — LISINOPRIL 40 MG PO TABS: 40.0000 mg | ORAL_TABLET | Freq: Every day | ORAL | Status: DC

## 2011-10-08 MED ORDER — LEVOTHYROXINE SODIUM 88 MCG PO TABS: 88.0000 ug | ORAL_TABLET | Freq: Every day | ORAL | Status: DC

## 2011-10-08 NOTE — Telephone Encounter (Signed)
[facility] needing refills on synthroid,lisinopril, actonel, crestor, nitrostat, md approve [facility], notified pt spoke with husband gave info....10/08/11@3 :35pm/LMB

## 2011-11-01 ENCOUNTER — Other Ambulatory Visit: Payer: Self-pay | Admitting: Internal Medicine

## 2011-11-01 DIAGNOSIS — Z1231 Encounter for screening mammogram for malignant neoplasm of breast: Secondary | ICD-10-CM

## 2011-11-18 ENCOUNTER — Ambulatory Visit
Admission: RE | Admit: 2011-11-18 | Discharge: 2011-11-18 | Disposition: A | Discharge status: Home / Self Care | Payer: Medicare Other | Source: Ambulatory Visit | Attending: Internal Medicine | Admitting: Internal Medicine

## 2011-11-18 DIAGNOSIS — Z1231 Encounter for screening mammogram for malignant neoplasm of breast: Secondary | ICD-10-CM

## 2011-12-14 ENCOUNTER — Ambulatory Visit: Payer: Medicare Other | Admitting: Internal Medicine

## 2011-12-17 ENCOUNTER — Ambulatory Visit (INDEPENDENT_AMBULATORY_CARE_PROVIDER_SITE_OTHER): Payer: Medicare Other | Admitting: Internal Medicine

## 2011-12-17 ENCOUNTER — Ambulatory Visit (INDEPENDENT_AMBULATORY_CARE_PROVIDER_SITE_OTHER)
Admission: RE | Admit: 2011-12-17 | Discharge: 2011-12-17 | Disposition: A | Discharge status: Home / Self Care | Payer: Medicare Other | Source: Ambulatory Visit

## 2011-12-17 ENCOUNTER — Ambulatory Visit: Payer: Medicare Other | Admitting: Internal Medicine

## 2011-12-17 ENCOUNTER — Encounter: Payer: Self-pay | Admitting: Internal Medicine

## 2011-12-17 ENCOUNTER — Other Ambulatory Visit (INDEPENDENT_AMBULATORY_CARE_PROVIDER_SITE_OTHER): Payer: Medicare Other

## 2011-12-17 VITALS — BP 120/85 | HR 71 | Temp 97.4°F | Wt 179.1 lb

## 2011-12-17 DIAGNOSIS — M8080XA Other osteoporosis with current pathological fracture, unspecified site, initial encounter for fracture: Secondary | ICD-10-CM

## 2011-12-17 DIAGNOSIS — E785 Hyperlipidemia, unspecified: Secondary | ICD-10-CM

## 2011-12-17 DIAGNOSIS — M8440XA Pathological fracture, unspecified site, initial encounter for fracture: Secondary | ICD-10-CM

## 2011-12-17 DIAGNOSIS — R300 Dysuria: Secondary | ICD-10-CM

## 2011-12-17 DIAGNOSIS — G4700 Insomnia, unspecified: Secondary | ICD-10-CM

## 2011-12-17 LAB — URINALYSIS, ROUTINE W REFLEX MICROSCOPIC
Nitrite: POSITIVE
Specific Gravity, Urine: 1.015 (ref 1.000–1.030)
Urobilinogen, UA: 0.2 (ref 0.0–1.0)
pH: 7 (ref 5.0–8.0)

## 2011-12-17 LAB — LIPID PANEL
Cholesterol: 182 mg/dL (ref 0–200)
HDL: 58.9 mg/dL (ref >39.00)
LDL Cholesterol: 93 mg/dL (ref 0–99)
Total CHOL/HDL Ratio: 3
Triglycerides: 149 mg/dL (ref 0.0–149.0)
VLDL: 29.8 mg/dL (ref 0.0–40.0)

## 2011-12-17 MED ORDER — ZOLPIDEM TARTRATE 5 MG PO TABS: 5.0000 mg | ORAL_TABLET | Freq: Every evening | ORAL | Status: DC | PRN

## 2011-12-17 NOTE — Progress Notes (Signed)
  Subjective:    Patient ID: Lindsey Erickson, female    DOB: 1932-02-17, 78 y.o.   MRN: [id number]  HPI here for follow up - Reviewed chronic medical issues today:  Hypothyroid  -follows with endo for same. hx hashimotos and goiter - s/p bx x 3 - benign - last in 07/2002- the patient reports compliance with medication(s) as prescribed. Denies adverse side effects. No weight or skin changes.  Dyslipidemia - on statin for same since MI 2004 - the patient reports compliance with medication(s) as prescribed. Denies adverse side effects.  hypertention - the patient reports compliance with medication(s) as prescribed. Denies adverse side effects.  Osteoporosis - hx fx following accidental fall x2 - takes actonel with vit d + calcium as prescribed. Denies adverse side effects from same. ?new dexa  R knee DJD - delaying R TKR with ortho - improved with synvisc injections for pain control, no swelling, no nocturnal pain  Past Medical History  Diagnosis Date  . Hypothyroidism   . Cholecystitis 02/2010  . Kidney stones   . Osteoporosis   . Squamous cell carcinoma   . Hyperlipidemia   . Arthritis   . HYPERTENSION    Review of Systems Constitutional: Negative for fever.  Respiratory: Negative for cough and shortness of breath.   Cardiovascular: Negative for chest pain.  GU: dysuria and "raw" feeling x1 week, negative vaginal discharge Neurological: Negative for dizziness.     Objective:   Physical Exam  BP 120/85  Pulse 71  Temp(Src) 97.4 F (36.3 C) (Oral)  Wt 179 lb 1.9 oz (81.248 kg)  SpO2 96% Constitutional: She appears well-developed and well-nourished. No distress.  Neck: Normal range of motion. Neck supple. No JVD present. No thyromegaly present.  Cardiovascular: Normal rate, regular rhythm and normal heart sounds.  No murmur heard. Pulmonary/Chest: Effort normal and breath sounds normal. No respiratory distress. She has no wheezes.  Psychiatric: She has a normal mood and  affect. Her behavior is normal. Judgment and thought content normal.      Lab Results  Component Value Date   WBC 6.0 06/09/2010   HGB 14.0 06/09/2010   HCT 40.7 06/09/2010   PLT 256 06/09/2010   CHOL 180 02/15/2011   TRIG 169.0* 02/15/2011   HDL 63.20 02/15/2011   ALT 16 02/15/2011   AST 20 02/15/2011   NA 138 08/26/2010   K 4.1 08/26/2010   CL 100 06/09/2010   CL 100 06/09/2010   CREATININE 0.6 08/26/2010   BUN 24* 08/26/2010   CO2 28 06/09/2010   CO2 25 06/09/2010   TSH 0.54 06/15/2011   INR 1.32 02/25/2010   HGBA1C 5.7 08/26/2010      Assessment & Plan:  See problem list. Medications and labs reviewed today.  Dysuria x1 week - check UA. Treat with Cipro with UTI or Diflucan if negative UA  insomnia, occasional. Low-dose Ambien as needed.Prescription provided

## 2011-12-17 NOTE — Patient Instructions (Signed)
It was good to see you today. Test(s) ordered today. Your results will be called to you after review (48-72hours after test completion). If any changes need to be made, you will be notified at that time. Ambien as needed - Your prescription(s) have been given to you to submit to your pharmacy. Please take as directed and contact our office if you believe you are having problem(s) with the medication(s). we'll make referral for bone density. Our office will contact you regarding appointment(s) once made. Please schedule followup in 6 months, call sooner if problems.

## 2011-12-17 NOTE — Assessment & Plan Note (Signed)
The current statin medical regimen is effective;  continue present plan and medications.  Lab Results  Component Value Date   LDLCALC 83 02/15/2011    

## 2011-12-17 NOTE — Assessment & Plan Note (Signed)
Continue bisphos and Calcium + D fx hx accidental fall in 12/2000 (elbow R) and 05/2008 (foot L) Check DEXA now to monitor tx (actonel for years)

## 2011-12-20 ENCOUNTER — Other Ambulatory Visit: Payer: Self-pay | Admitting: Internal Medicine

## 2011-12-20 ENCOUNTER — Telehealth: Payer: Self-pay | Admitting: Internal Medicine

## 2011-12-20 MED ORDER — FLUCONAZOLE 150 MG PO TABS: 150.0000 mg | ORAL_TABLET | Freq: Once | ORAL | Status: AC

## 2011-12-20 NOTE — Telephone Encounter (Signed)
Pt is requesting med call in for UTI---Per pt have not heard from this office on which infection it is--UTI or yeast infection.---Pharm--[facility] [address]---

## 2011-12-21 NOTE — Telephone Encounter (Signed)
erx for diflucan done 1/28 via lab result note - see chart - thanks

## 2011-12-23 ENCOUNTER — Encounter: Payer: Self-pay | Admitting: Internal Medicine

## 2012-03-10 ENCOUNTER — Telehealth: Payer: Self-pay | Admitting: *Deleted

## 2012-03-10 ENCOUNTER — Ambulatory Visit (INDEPENDENT_AMBULATORY_CARE_PROVIDER_SITE_OTHER): Payer: Medicare Other | Admitting: Endocrinology

## 2012-03-10 ENCOUNTER — Encounter: Payer: Self-pay | Admitting: Endocrinology

## 2012-03-10 VITALS — BP 130/82 | HR 89 | Temp 98.2°F

## 2012-03-10 DIAGNOSIS — L259 Unspecified contact dermatitis, unspecified cause: Secondary | ICD-10-CM

## 2012-03-10 MED ORDER — METHYLPREDNISOLONE SODIUM SUCC 125 MG IJ SOLR
125.0000 mg | Freq: Once | INTRAMUSCULAR | Status: AC
  Administered 2012-03-10: 125 mg via INTRAMUSCULAR

## 2012-03-10 NOTE — Patient Instructions (Signed)
You can continue the "desonide," as needed. I hope you feel better soon.  If you don't feel better by next week, please call back.

## 2012-03-10 NOTE — Progress Notes (Signed)
Subjective:    Patient ID: Lindsey Erickson, female    DOB: October 29, 1932, 78 y.o.   MRN: [id number]  HPI Pt states 9 days of moderate rash on the neck, right ear, right flank, and arms.  She has assoc itching.  she saw dermatologist 3 days ago, and was rx'ed desonide.   Past Medical History  Diagnosis Date  . Hypothyroidism   . Cholecystitis 02/2010  . Kidney stones   . Osteoporosis   . Squamous cell carcinoma   . Hyperlipidemia   . Arthritis   . HYPERTENSION     Past Surgical History  Procedure Date  . Appendectomy 1999  . Knee repalcement 2011  . Cholecystectomy 02/2010  . Bilateral arthroscopies of knees   . Joint replacement 01/2010    L TKR - allusio    History   Social History  . Marital Status: Married    Spouse Name: N/A    Number of Children: N/A  . Years of Education: N/A   Occupational History  . Not on file.   Social History Main Topics  . Smoking status: Never Smoker   . Smokeless tobacco: Not on file   Comment: Married, 2 children. Retired from [employer] [address]  . Alcohol Use: No  . Drug Use: No  . Sexually Active:    Other Topics Concern  . Not on file   Social History Narrative  . No narrative on file    Current Outpatient Prescriptions on File Prior to Visit  Medication Sig Dispense Refill  . Ascorbic Acid (VITAMIN C) 500 MG tablet Take 500 mg by mouth daily.        Marland Kitchen aspirin 81 MG tablet Take 81 mg by mouth daily.        . Calcium Carbonate-Vitamin D (CALCIUM-VITAMIN D) 500-200 MG-UNIT per tablet Take 1 tablet by mouth 2 (two) times daily with a meal.        . carvedilol (COREG CR) 40 MG 24 hr capsule Take 1 capsule (40 mg total) by mouth daily.  90 capsule  3  . hydrochlorothiazide (HYDRODIURIL) 12.5 MG tablet Take 12.5 mg by mouth every morning.      Marland Kitchen levothyroxine (SYNTHROID, LEVOTHROID) 88 MCG tablet Take 1 tablet (88 mcg total) by mouth daily.  90 tablet  3  . lisinopril (PRINIVIL,ZESTRIL) 40 MG tablet Take 1 tablet (40 mg total) by mouth  daily.  90 tablet  3  . Methylsulfonylmethane (MSM) 1000 MG TABS Take 1,000 mg by mouth 2 (two) times daily.        . Multiple Vitamin (MULTIVITAMIN) tablet Take 1 tablet by mouth daily.        . nitroGLYCERIN (NITROSTAT) 0.4 MG SL tablet Place 1 tablet (0.4 mg total) under the tongue every 5 (five) minutes as needed.  90 tablet  0  . pramoxine-hydrocortisone (ANALPRAM HC) cream Apply topically 2 (two) times daily as needed.        . risedronate (ACTONEL) 35 MG tablet Take 1 tablet (35 mg total) by mouth every 7 (seven) days. with water on empty stomach, nothing by mouth or lie down for next 30 minutes.  12 tablet  3  . rosuvastatin (CRESTOR) 10 MG tablet Take 1 tablet (10 mg total) by mouth daily.  90 tablet  3  . zolpidem (AMBIEN) 5 MG tablet Take 1 tablet (5 mg total) by mouth at bedtime as needed for sleep.  30 tablet  3    Allergies  Allergen Reactions  .  Codeine     Pt states  . Monte-G Hc     All narcotics  . Other     Frozen Blood Plasma/ shaking, increase temp    Family History  Problem Relation Age of Onset  . Allergies Father   . Cancer Father   . Coronary artery disease Mother     BP 130/82  Pulse 89  Temp(Src) 98.2 F (36.8 C) (Oral)  SpO2 93%   Review of Systems Denies fever.    Objective:   Physical Exam VITAL SIGNS:  See vs page GENERAL: no distress Skin:  Mild red rash on these areas, except for severe rash on the right flank and right side of the abdomen.       Assessment & Plan:  Contact dermatitis, new.  Severe Solumedrol 125 mg IM

## 2012-03-10 NOTE — Telephone Encounter (Signed)
Left msg on vm have some poison ivy & saw her dermatologist on Tues was rx some cream but poison ivy is still spreading. Called pt back spoke with husband advise will need appt. Transferred to schedulers to make appt... 03/10/12@11 :49am/LMB

## 2012-04-11 ENCOUNTER — Other Ambulatory Visit: Payer: Self-pay | Admitting: Dermatology

## 2012-05-15 ENCOUNTER — Telehealth: Payer: Self-pay | Admitting: Internal Medicine

## 2012-05-15 NOTE — Telephone Encounter (Signed)
Forward to Dr. Felicity Coyer for review on 05-15-12 ym

## 2012-05-21 ENCOUNTER — Inpatient Hospital Stay (HOSPITAL_COMMUNITY)
Admission: EM | Admit: 2012-05-21 | Discharge: 2012-05-23 | DRG: 287 | Disposition: A | Discharge status: Home / Self Care | Payer: Medicare Other | Attending: Interventional Cardiology | Admitting: Interventional Cardiology

## 2012-05-21 ENCOUNTER — Encounter (HOSPITAL_COMMUNITY): Payer: Self-pay | Admitting: Emergency Medicine

## 2012-05-21 DIAGNOSIS — I10 Essential (primary) hypertension: Secondary | ICD-10-CM | POA: Diagnosis present

## 2012-05-21 DIAGNOSIS — I209 Angina pectoris, unspecified: Secondary | ICD-10-CM | POA: Diagnosis present

## 2012-05-21 DIAGNOSIS — I2510 Atherosclerotic heart disease of native coronary artery without angina pectoris: Principal | ICD-10-CM | POA: Diagnosis present

## 2012-05-21 DIAGNOSIS — M79609 Pain in unspecified limb: Secondary | ICD-10-CM | POA: Diagnosis present

## 2012-05-21 DIAGNOSIS — I249 Acute ischemic heart disease, unspecified: Secondary | ICD-10-CM

## 2012-05-21 DIAGNOSIS — E785 Hyperlipidemia, unspecified: Secondary | ICD-10-CM | POA: Diagnosis present

## 2012-05-21 LAB — BASIC METABOLIC PANEL
BUN: 17 mg/dL (ref 6–23)
Calcium: 9.8 mg/dL (ref 8.4–10.5)
GFR calc Af Amer: >90 mL/min (ref >90)
GFR calc non Af Amer: 83 mL/min — ABNORMAL LOW (ref >90)
Glucose, Bld: 147 mg/dL — ABNORMAL HIGH (ref 70–99)
Potassium: 3.6 mEq/L (ref 3.5–5.1)
Sodium: 138 mEq/L (ref 135–145)

## 2012-05-21 LAB — CBC WITH DIFFERENTIAL/PLATELET
Basophils Relative: 1 % (ref 0–1)
Eosinophils Absolute: 0.3 10*3/uL (ref 0.0–0.7)
Eosinophils Relative: 5 % (ref 0–5)
MCH: 32.8 pg (ref 26.0–34.0)
MCHC: 34.8 g/dL (ref 30.0–36.0)
MCV: 94.4 fL (ref 78.0–100.0)
Neutrophils Relative %: 45 % (ref 43–77)
Platelets: 232 10*3/uL (ref 150–400)

## 2012-05-21 MED ORDER — MORPHINE SULFATE 4 MG/ML IJ SOLN: 4.0000 mg | Freq: Once | INTRAMUSCULAR | Status: DC

## 2012-05-21 MED ORDER — ONDANSETRON HCL 4 MG/2ML IJ SOLN
4.0000 mg | Freq: Once | INTRAMUSCULAR | Status: AC
  Administered 2012-05-22: 4 mg via INTRAVENOUS
  Filled 2012-05-21: qty 2

## 2012-05-21 MED ORDER — NITROGLYCERIN 0.4 MG SL SUBL: 0.4000 mg | SUBLINGUAL_TABLET | SUBLINGUAL | Status: DC | PRN

## 2012-05-21 NOTE — ED Notes (Signed)
C/o R sided neck pain and R shoulder pain that started around 2145 tonight while sitting down.  States she took 6 baby ASA.  Pt states she was seen by cardiologist on Wednesday. No known injury.

## 2012-05-21 NOTE — ED Notes (Signed)
Lab at bedside

## 2012-05-21 NOTE — ED Notes (Signed)
Dr. Opitz at bedside. 

## 2012-05-21 NOTE — ED Notes (Signed)
Patient complaining of right sided neck pain and right shoulder pain that started tonight while she was watching TV.  Patient took blood pressure -- 189/92.  Patient took 6 baby aspirin and a Digitalis at home.  Patient states that her pain has decreased since she has been to the EDP. Patient currently reporting nausea and a feeling that she needs to "burp".  Patient denies chest pain and shortness of breath.  Patient was seen by her Cardiologist this past week due to a similar episode about a month ago (neck pain and increased blood pressure).  Patient was told that she has a "mild blockage" (doesn't know where), was put on Lasix, and referred to the Cardiologist.  Patient alert and oriented x4; PERRL present.  Upon arrival to room, patient changed into gown and connected to continuous cardiac, pulse ox, and blood pressure monitor.  Family present at bedside.  Will continue to monitor.

## 2012-05-22 ENCOUNTER — Emergency Department (HOSPITAL_COMMUNITY): Payer: Medicare Other

## 2012-05-22 ENCOUNTER — Encounter (HOSPITAL_COMMUNITY): Payer: Self-pay | Admitting: *Deleted

## 2012-05-22 ENCOUNTER — Encounter (HOSPITAL_COMMUNITY)
Admission: EM | Disposition: A | Discharge status: Home / Self Care | Payer: Self-pay | Source: Home / Self Care | Attending: Interventional Cardiology

## 2012-05-22 DIAGNOSIS — M542 Cervicalgia: Secondary | ICD-10-CM

## 2012-05-22 DIAGNOSIS — I10 Essential (primary) hypertension: Secondary | ICD-10-CM

## 2012-05-22 HISTORY — PX: LEFT HEART CATHETERIZATION WITH CORONARY ANGIOGRAM: SHX5451

## 2012-05-22 LAB — TSH: TSH: 1.104 u[IU]/mL (ref 0.350–4.500)

## 2012-05-22 LAB — LIPID PANEL
HDL: 54 mg/dL (ref >39)
LDL Cholesterol: 57 mg/dL (ref 0–99)
Total CHOL/HDL Ratio: 2.7 RATIO
Triglycerides: 170 mg/dL — ABNORMAL HIGH (ref <150)
VLDL: 34 mg/dL (ref 0–40)

## 2012-05-22 LAB — CARDIAC PANEL(CRET KIN+CKTOT+MB+TROPI)
CK, MB: 1.8 ng/mL (ref 0.3–4.0)
CK, MB: 1.6 ng/mL (ref 0.3–4.0)
CK, MB: 1.7 ng/mL (ref 0.3–4.0)
Relative Index: INVALID (ref 0.0–2.5)
Relative Index: INVALID (ref 0.0–2.5)
Total CK: 66 U/L (ref 7–177)
Troponin I: <0.3 ng/mL (ref <0.30)
Troponin I: <0.3 ng/mL (ref <0.30)
Troponin I: <0.3 ng/mL (ref <0.30)

## 2012-05-22 LAB — PROTIME-INR
INR: 1.27 (ref 0.00–1.49)
Prothrombin Time: 16.2 seconds — ABNORMAL HIGH (ref 11.6–15.2)

## 2012-05-22 LAB — MRSA PCR SCREENING: MRSA by PCR: NEGATIVE

## 2012-05-22 SURGERY — LEFT HEART CATHETERIZATION WITH CORONARY ANGIOGRAM
Anesthesia: LOCAL

## 2012-05-22 MED ORDER — VERAPAMIL HCL 2.5 MG/ML IV SOLN
INTRAVENOUS | Status: AC
  Filled 2012-05-22: qty 2

## 2012-05-22 MED ORDER — ASPIRIN 81 MG PO CHEW: 81.0000 mg | CHEWABLE_TABLET | Freq: Every day | ORAL | Status: DC

## 2012-05-22 MED ORDER — ACETAMINOPHEN 325 MG PO TABS
650.0000 mg | ORAL_TABLET | ORAL | Status: DC | PRN
  Administered 2012-05-22: 650 mg via ORAL
  Filled 2012-05-22: qty 2

## 2012-05-22 MED ORDER — SODIUM CHLORIDE 0.9 % IJ SOLN
3.0000 mL | Freq: Two times a day (BID) | INTRAMUSCULAR | Status: DC
  Administered 2012-05-22 (×2): 3 mL via INTRAVENOUS
  Administered 2012-05-23: 10:00:00 via INTRAVENOUS

## 2012-05-22 MED ORDER — SODIUM CHLORIDE 0.9 % IJ SOLN
3.0000 mL | Freq: Two times a day (BID) | INTRAMUSCULAR | Status: DC
  Administered 2012-05-22: 3 mL via INTRAVENOUS

## 2012-05-22 MED ORDER — LISINOPRIL 40 MG PO TABS
40.0000 mg | ORAL_TABLET | Freq: Every day | ORAL | Status: DC
  Administered 2012-05-22 – 2012-05-23 (×2): 40 mg via ORAL
  Filled 2012-05-22 (×2): qty 1

## 2012-05-22 MED ORDER — ZOLPIDEM TARTRATE 5 MG PO TABS: 5.0000 mg | ORAL_TABLET | Freq: Every evening | ORAL | Status: DC | PRN

## 2012-05-22 MED ORDER — MIDAZOLAM HCL 2 MG/2ML IJ SOLN
INTRAMUSCULAR | Status: AC
  Filled 2012-05-22: qty 2

## 2012-05-22 MED ORDER — ADULT MULTIVITAMIN W/MINERALS CH
1.0000 | ORAL_TABLET | Freq: Every day | ORAL | Status: DC
  Administered 2012-05-22 – 2012-05-23 (×2): 1 via ORAL
  Filled 2012-05-22 (×2): qty 1

## 2012-05-22 MED ORDER — NITROGLYCERIN IN D5W 200-5 MCG/ML-% IV SOLN
5.0000 ug/min | INTRAVENOUS | Status: DC
  Administered 2012-05-22: 5 ug/min via INTRAVENOUS
  Filled 2012-05-22: qty 250

## 2012-05-22 MED ORDER — NITROGLYCERIN 0.2 MG/ML ON CALL CATH LAB
INTRAVENOUS | Status: AC
  Filled 2012-05-22: qty 1

## 2012-05-22 MED ORDER — NITROGLYCERIN 0.4 MG SL SUBL: 0.4000 mg | SUBLINGUAL_TABLET | SUBLINGUAL | Status: DC | PRN

## 2012-05-22 MED ORDER — ASPIRIN EC 325 MG PO TBEC
325.0000 mg | DELAYED_RELEASE_TABLET | Freq: Every day | ORAL | Status: DC
  Administered 2012-05-23: 325 mg via ORAL
  Filled 2012-05-22 (×2): qty 1

## 2012-05-22 MED ORDER — ONDANSETRON HCL 4 MG/2ML IJ SOLN: 4.0000 mg | Freq: Four times a day (QID) | INTRAMUSCULAR | Status: DC | PRN

## 2012-05-22 MED ORDER — HYDROCHLOROTHIAZIDE 25 MG PO TABS
12.5000 mg | ORAL_TABLET | ORAL | Status: DC
Start: 2012-05-22 — End: 2012-05-22

## 2012-05-22 MED ORDER — SODIUM CHLORIDE 0.9 % IJ SOLN: 3.0000 mL | INTRAMUSCULAR | Status: DC | PRN

## 2012-05-22 MED ORDER — HEPARIN (PORCINE) IN NACL 100-0.45 UNIT/ML-% IJ SOLN
950.0000 [IU]/h | INTRAMUSCULAR | Status: DC
  Administered 2012-05-22: 950 [IU]/h via INTRAVENOUS
  Filled 2012-05-22 (×2): qty 250

## 2012-05-22 MED ORDER — CARVEDILOL PHOSPHATE ER 40 MG PO CP24
40.0000 mg | ORAL_CAPSULE | Freq: Every day | ORAL | Status: DC
  Administered 2012-05-22 – 2012-05-23 (×2): 40 mg via ORAL
  Filled 2012-05-22 (×2): qty 1

## 2012-05-22 MED ORDER — DIAZEPAM 5 MG PO TABS
5.0000 mg | ORAL_TABLET | ORAL | Status: AC
  Administered 2012-05-22: 5 mg via ORAL
  Filled 2012-05-22: qty 1

## 2012-05-22 MED ORDER — SODIUM CHLORIDE 0.9 % IV SOLN: INTRAVENOUS | Status: AC

## 2012-05-22 MED ORDER — ASPIRIN 81 MG PO CHEW: 324.0000 mg | CHEWABLE_TABLET | ORAL | Status: DC

## 2012-05-22 MED ORDER — LEVOTHYROXINE SODIUM 88 MCG PO TABS
88.0000 ug | ORAL_TABLET | Freq: Every day | ORAL | Status: DC
  Administered 2012-05-22 – 2012-05-23 (×2): 88 ug via ORAL
  Filled 2012-05-22 (×3): qty 1

## 2012-05-22 MED ORDER — SODIUM CHLORIDE 0.9 % IV SOLN: 250.0000 mL | INTRAVENOUS | Status: DC | PRN

## 2012-05-22 MED ORDER — HYDROCHLOROTHIAZIDE 12.5 MG PO CAPS
12.5000 mg | ORAL_CAPSULE | ORAL | Status: DC
  Administered 2012-05-22: 12.5 mg via ORAL
  Filled 2012-05-22: qty 1

## 2012-05-22 MED ORDER — SODIUM CHLORIDE 0.9 % IV SOLN: INTRAVENOUS | Status: DC

## 2012-05-22 MED ORDER — ASPIRIN 81 MG PO CHEW
324.0000 mg | CHEWABLE_TABLET | ORAL | Status: AC
  Administered 2012-05-22: 324 mg via ORAL
  Filled 2012-05-22: qty 4

## 2012-05-22 MED ORDER — LIDOCAINE HCL (PF) 1 % IJ SOLN
INTRAMUSCULAR | Status: AC
  Filled 2012-05-22: qty 30

## 2012-05-22 MED ORDER — HEPARIN BOLUS VIA INFUSION
4000.0000 [IU] | Freq: Once | INTRAVENOUS | Status: AC
  Administered 2012-05-22: 4000 [IU] via INTRAVENOUS

## 2012-05-22 MED ORDER — FENTANYL CITRATE 0.05 MG/ML IJ SOLN
INTRAMUSCULAR | Status: AC
  Filled 2012-05-22: qty 2

## 2012-05-22 MED ORDER — ATORVASTATIN CALCIUM 40 MG PO TABS
40.0000 mg | ORAL_TABLET | Freq: Every day | ORAL | Status: DC
  Filled 2012-05-22 (×2): qty 1

## 2012-05-22 MED ORDER — ACETAMINOPHEN 325 MG PO TABS: 650.0000 mg | ORAL_TABLET | ORAL | Status: DC | PRN

## 2012-05-22 MED ORDER — HEPARIN SODIUM (PORCINE) 1000 UNIT/ML IJ SOLN
INTRAMUSCULAR | Status: AC
  Filled 2012-05-22: qty 1

## 2012-05-22 MED ORDER — HEPARIN (PORCINE) IN NACL 2-0.9 UNIT/ML-% IJ SOLN
INTRAMUSCULAR | Status: AC
  Filled 2012-05-22: qty 2000

## 2012-05-22 NOTE — ED Provider Notes (Signed)
History     CSN: [id number]  Arrival date & time 05/21/12  2217   First MD Initiated Contact with Patient 05/21/12 2315      Chief Complaint  Patient presents with  . Neck Pain    (Consider location/radiation/quality/duration/timing/severity/associated sxs/prior treatment) HPI History provided by patient and family bedside. While at rest around 10 PM tonight developed right-sided jaw, neck and shoulder pain. He has some associated shortness of breath. Some nausea. No diaphoresis. Pain is moderate in severity. No known aggravating or alleviating symptoms. Patient is followed by cardiologist Dr. Verdis Prime. She reports that she had a nuclear medicine stress test done in April of this year, and her cardiologist told her she needed to have a cardiac catheterization at that time. She declined and her cardiologist told her that if she had any more of these symptoms within the next 6 months but she definitely needed a cardiac catheterization. These are the same symptoms she was having in April.  No known history of MI or coronary disease otherwise. With onset of symptoms she took 6 baby aspirin and one nitroglycerin that did help her pain some. She has an extreme reaction to codeine and all morphine products and declines any morphine. Past Medical History  Diagnosis Date  . Hypothyroidism   . Cholecystitis 02/2010  . Kidney stones   . Osteoporosis   . Squamous cell carcinoma   . Hyperlipidemia   . Arthritis   . HYPERTENSION     Past Surgical History  Procedure Date  . Appendectomy 1999  . Knee repalcement 2011  . Cholecystectomy 02/2010  . Bilateral arthroscopies of knees   . Joint replacement 01/2010    L TKR - allusio    Family History  Problem Relation Age of Onset  . Allergies Father   . Cancer Father   . Coronary artery disease Mother     History  Substance Use Topics  . Smoking status: Never Smoker   . Smokeless tobacco: Not on file   Comment: Married, 2 children.  Retired from [employer] [address]  . Alcohol Use: No    OB History    Grav Para Term Preterm Abortions TAB SAB Ect Mult Living                  Review of Systems  Constitutional: Negative for fever and chills.  HENT: Positive for neck pain. Negative for neck stiffness.   Eyes: Negative for pain.  Respiratory: Positive for shortness of breath.   Gastrointestinal: Positive for nausea. Negative for vomiting and abdominal pain.  Genitourinary: Negative for dysuria.  Musculoskeletal: Negative for back pain.  Skin: Negative for rash.  Neurological: Negative for headaches.  All other systems reviewed and are negative.    Allergies  Other; Codeine; and Monte-g hc  Home Medications   Current Outpatient Rx  Name Route Sig Dispense Refill  . VITAMIN C 500 MG PO TABS Oral Take 500 mg by mouth daily.      . ASPIRIN 81 MG PO TABS Oral Take 81 mg by mouth daily.      Marland Kitchen CALCIUM-VITAMIN D 500-200 MG-UNIT PO TABS Oral Take 1 tablet by mouth 2 (two) times daily with a meal.      . CARVEDILOL PHOSPHATE ER 40 MG PO CP24 Oral Take 1 capsule (40 mg total) by mouth daily. 90 capsule 3  . DESONIDE 0.05 % EX OINT Topical Apply topically 2 (two) times daily.    Marland Kitchen HYDROCHLOROTHIAZIDE 12.5 MG PO TABS  Oral Take 12.5 mg by mouth 3 (three) times a week. Monday Wednesday Friday    . LEVOTHYROXINE SODIUM 88 MCG PO TABS Oral Take 88 mcg by mouth daily.    Marland Kitchen LISINOPRIL 40 MG PO TABS Oral Take 40 mg by mouth daily.    Marland Kitchen MSM 1000 MG PO TABS Oral Take 1,000 mg by mouth 2 (two) times daily.      . ADULT MULTIVITAMIN W/MINERALS CH Oral Take 1 tablet by mouth daily.    Marland Kitchen NITROGLYCERIN 0.4 MG SL SUBL Sublingual Place 0.4 mg under the tongue every 5 (five) minutes as needed. For chest pain    . PRAMOXINE-HC 1-1 % EX CREA Topical Apply 1 application topically 2 (two) times daily as needed. For irritation    . RISEDRONATE SODIUM 35 MG PO TABS Oral Take 35 mg by mouth every 7 (seven) days. with water on empty stomach, nothing  by mouth or lie down for next 30 minutes.    Marland Kitchen ROSUVASTATIN CALCIUM 10 MG PO TABS Oral Take 20 mg by mouth daily.    Marland Kitchen ZOLPIDEM TARTRATE 5 MG PO TABS Oral Take 5 mg by mouth at bedtime as needed. For sleep      BP 159/77  Pulse 88  Temp 98.2 F (36.8 C)  Resp 27  SpO2 99%  Physical Exam  Constitutional: She is oriented to person, place, and time. She appears well-developed and well-nourished.  HENT:  Head: Normocephalic and atraumatic.  Eyes: Conjunctivae and EOM are normal. Pupils are equal, round, and reactive to light.  Neck: Trachea normal. Neck supple. No thyromegaly present.  Cardiovascular: Normal rate, regular rhythm, S1 normal, S2 normal and normal pulses.     No systolic murmur is present   No diastolic murmur is present  Pulses:      Radial pulses are 2+ on the right side, and 2+ on the left side.  Pulmonary/Chest: Effort normal and breath sounds normal. She has no wheezes. She has no rhonchi. She has no rales. She exhibits no tenderness.  Abdominal: Soft. Normal appearance and bowel sounds are normal. There is no tenderness. There is no CVA tenderness and negative Murphy's sign.  Musculoskeletal:       BLE:s Calves nontender, no cords or erythema, negative Homans sign  Neurological: She is alert and oriented to person, place, and time. She has normal strength. No cranial nerve deficit or sensory deficit. GCS eye subscore is 4. GCS verbal subscore is 5. GCS motor subscore is 6.  Skin: Skin is warm and dry. No rash noted. She is not diaphoretic.  Psychiatric: Her speech is normal.       Cooperative and appropriate    ED Course  Procedures (including critical care time)  Results for orders placed during the hospital encounter of 05/21/12  CBC WITH DIFFERENTIAL      Component Value Range   WBC 6.1  4.0 - 10.5 K/uL   RBC 3.96  3.87 - 5.11 MIL/uL   Hemoglobin 13.0  12.0 - 15.0 g/dL   HCT 16.1  09.6 - 04.5 %   MCV 94.4  78.0 - 100.0 fL   MCH 32.8  26.0 - 34.0 pg    MCHC 34.8  30.0 - 36.0 g/dL   RDW 40.9  81.1 - 91.4 %   Platelets 232  150 - 400 K/uL   Neutrophils Relative 45  43 - 77 %   Neutro Abs 2.7  1.7 - 7.7 K/uL   Lymphocytes Relative 41  12 - 46 %   Lymphs Abs 2.5  0.7 - 4.0 K/uL   Monocytes Relative 8  3 - 12 %   Monocytes Absolute 0.5  0.1 - 1.0 K/uL   Eosinophils Relative 5  0 - 5 %   Eosinophils Absolute 0.3  0.0 - 0.7 K/uL   Basophils Relative 1  0 - 1 %   Basophils Absolute 0.1  0.0 - 0.1 K/uL  BASIC METABOLIC PANEL      Component Value Range   Sodium 138  135 - 145 mEq/L   Potassium 3.6  3.5 - 5.1 mEq/L   Chloride 102  96 - 112 mEq/L   CO2 24  19 - 32 mEq/L   Glucose, Bld 147 (*) 70 - 99 mg/dL   BUN 17  6 - 23 mg/dL   Creatinine, Ser 4.78  0.50 - 1.10 mg/dL   Calcium 9.8  8.4 - 29.5 mg/dL   GFR calc non Af Amer 83 (*) >90 mL/min   GFR calc Af Amer >90  >90 mL/min  TROPONIN I      Component Value Range   Troponin I <0.30  <0.30 ng/mL       Date: 05/22/2012  Rate: 92  Rhythm: normal sinus rhythm  QRS Axis: normal  Intervals: normal  ST/T Wave abnormalities: ST depressions inferiorly  Conduction Disutrbances:none  Narrative Interpretation: new inf ST depressions  Old EKG Reviewed: changes noted  ASA> NTG provided. PT declines morphine for pain. Plan serial EKGs for persistent pain given symptoms and EKG as above.  12:12 AM d/w S turner on call for [employer]- agrees to ED eval and admit.   CRITICAL CARE Performed by: Sunnie Nielsen   Total critical care time: 35  Critical care time was exclusive of separately billable procedures and treating other patients.  Critical care was necessary to treat or prevent imminent or life-threatening deterioration.  Critical care was time spent personally by me on the following activities: development of treatment plan with patient and/or surrogate as well as nursing, discussions with consultants, evaluation of patient's response to treatment, examination of patient, obtaining  history from patient or surrogate, ordering and performing treatments and interventions, ordering and review of laboratory studies, ordering and review of radiographic studies, pulse oximetry and re-evaluation of patient's condition. IV nitroglycerin drip for ongoing chest pain with ST depressions on EKG.  MDM   Jaw, neck and shoulder pain with EKG as above concerning for ACS. Cardiology consult for evaluation and admission. Nitroglycerin drip initiated for active chest pain. Labs reviewed as above. Imaging obtained. Cardiology admission.        Sunnie Nielsen, MD 05/22/12 [phone]

## 2012-05-22 NOTE — Care Management Note (Signed)
    Page 1 of 1   05/22/2012     11:11:28 AM   CARE MANAGEMENT NOTE 05/22/2012  Patient:  Lindsey Erickson, Lindsey Erickson   Account Number:  [id number]  Date Initiated:  05/22/2012  Documentation initiated by:  Junius Creamer  Subjective/Objective Assessment:   adm w htn, ch apin     Action/Plan:   lives w husband, pcp dr Vikki Ports leschber   Anticipated DC Date:     Anticipated DC Plan:        DC Planning Services  CM consult      Choice offered to / List presented to:             Status of service:   Medicare Important Message given?   (If response is "NO", the following Medicare IM given date fields will be blank) Date Medicare IM given:   Date Additional Medicare IM given:    Discharge Disposition:    Per UR Regulation:  Reviewed for med. necessity/level of care/duration of stay  If discussed at Long Length of Stay Meetings, dates discussed:    Comments:  7/1 11:10a debbie Earlene Bjelland rn,bsn [phone]

## 2012-05-22 NOTE — ED Notes (Signed)
Patient currently resting quietly in bed; no respiratory or acute distress noted.  Patient updated on plan of care; informed patient that we are currently waiting on further orders from EDP.  Patient has no other questions or concerns at this time; will continue to monitor. 

## 2012-05-22 NOTE — Plan of Care (Signed)
Problem: Phase I Progression Outcomes Goal: Aspirin unless contraindicated Outcome: Completed/Met Date Met:  05/22/12 Pt states she took 6 baby ASA (81mg ) and 1 SL NTG tab prior to coming to ED

## 2012-05-22 NOTE — Plan of Care (Signed)
Problem: Phase II Progression Outcomes Goal: Stress Test if indicated Outcome: Completed/Met Date Met:  05/22/12 Pt will receive stress test as out pt at MD office

## 2012-05-22 NOTE — H&P (Signed)
Primary Cardiologist: Katrinka Blazing   CC: Right neck and right shoulder / arm pain  HPI: 78 year old white female with a past medical history of nonobstructive coronary artery disease status post myocardial infarction in 2004, hypertension, and hyperlipidemia who presents to the emergency department for evaluation of right-sided neck and shoulder pain in the setting of elevated blood pressures. Historically, she has a history of myocardial infarction in 2004. At that time, she underwent cardiac catheterization, which demonstrated moderate, nonobstructive coronary disease in the RCA and circumflex. She has done well with medical therapy since that time. Recently this past year, she underwent a stress test at Dr. Michaelle Copas office. I do not have the results of the stress test but it sounds as if they were equivocal. She was managed with medical therapy.  This evening, she was sitting down watching TV when she developed right-sided shoulder neck and arm pain. There was no chest pain or shortness of breath. Of note, her blood pressure was moderately elevated with a systolic blood pressure 190 diastolic blood pressure of 90. She denied any nausea, diaphoresis, or shortness of breath. Because the symptoms came to the emergency department after taking 6 baby aspirin and nitroglycerin at home. Here in the emergency department, she is been placed in a nitroglycerin drip and her symptoms have improved.  She is currently chest pain free with blood pressures in the 150/70s.     Past Medical History  Diagnosis Date  . Hypothyroidism   . Cholecystitis 02/2010  . Kidney stones   . Osteoporosis   . Squamous cell carcinoma   . Hyperlipidemia   . Arthritis   . HYPERTENSION     History   Social History  . Marital Status: Married    Spouse Name: N/A    Number of Children: N/A  . Years of Education: N/A   Occupational History  . Not on file.   Social History Main Topics  . Smoking status: Never Smoker   .  Smokeless tobacco: Not on file   Comment: Married, 2 children. Retired from [employer] [address]  . Alcohol Use: No  . Drug Use: No  . Sexually Active:    Other Topics Concern  . Not on file   Social History Narrative  . No narrative on file    Family History  Problem Relation Age of Onset  . Allergies Father   . Cancer Father   . Coronary artery disease Mother     No current facility-administered medications on file prior to encounter.   Current Outpatient Prescriptions on File Prior to Encounter  Medication Sig Dispense Refill  . Ascorbic Acid (VITAMIN C) 500 MG tablet Take 500 mg by mouth daily.        Marland Kitchen aspirin 81 MG tablet Take 81 mg by mouth daily.        . Calcium Carbonate-Vitamin D (CALCIUM-VITAMIN D) 500-200 MG-UNIT per tablet Take 1 tablet by mouth 2 (two) times daily with a meal.        . carvedilol (COREG CR) 40 MG 24 hr capsule Take 1 capsule (40 mg total) by mouth daily.  90 capsule  3  . desonide (DESOWEN) 0.05 % ointment Apply topically 2 (two) times daily.      . hydrochlorothiazide (HYDRODIURIL) 12.5 MG tablet Take 12.5 mg by mouth 3 (three) times a week. Monday Wednesday Friday      . levothyroxine (SYNTHROID, LEVOTHROID) 88 MCG tablet Take 88 mcg by mouth daily.      Marland Kitchen lisinopril (  PRINIVIL,ZESTRIL) 40 MG tablet Take 40 mg by mouth daily.      . Methylsulfonylmethane (MSM) 1000 MG TABS Take 1,000 mg by mouth 2 (two) times daily.        . nitroGLYCERIN (NITROSTAT) 0.4 MG SL tablet Place 0.4 mg under the tongue every 5 (five) minutes as needed. For chest pain      . pramoxine-hydrocortisone (ANALPRAM HC) cream Apply 1 application topically 2 (two) times daily as needed. For irritation      . risedronate (ACTONEL) 35 MG tablet Take 35 mg by mouth every 7 (seven) days. with water on empty stomach, nothing by mouth or lie down for next 30 minutes.      . rosuvastatin (CRESTOR) 10 MG tablet Take 20 mg by mouth daily.      Marland Kitchen DISCONTD: zolpidem (AMBIEN) 5 MG tablet Take 1  tablet (5 mg total) by mouth at bedtime as needed for sleep.  30 tablet  3     Allergies  Allergen Reactions  . Other     Frozen Blood Plasma/ shaking, increase temp  . Codeine Nausea And Vomiting    Pt states  . Monte-G Hc     All narcotics    Review of Systems: All systems reviewed and are negative except as mentioned above in the history of present illness.    PHYSICAL EXAM: Filed Vitals:   05/22/12 0133  BP: 140/63  Pulse: 92  Temp: 98.6 F (37 C)  Resp: 18   GENERAL: No acute distress.   HEENT: Normocephalic, atraumatic.  Oropharynx is pink and moist without lesions.  NECK: Supple, no LAD, no JVD, no masses. CV: Regular rate and rhythm with no murmurs, rubs, or gallops.   LUNGS: Clear to auscultation bilaterally.   ABDOMEN: +BS, soft, nontender, nondistended.  EXTREMITIES: No clubbing, cyanosis, or edema.   NEURO: AO x 3, no focal deficits. PYSCH: Normal affect. SKIN: No rashes.    ECG: 2221 - NSR with nonspecific STTW changes 0124 - NSR with resolution of STTW changes  Results for orders placed during the hospital encounter of 05/21/12 (from the past 24 hour(s))  CBC WITH DIFFERENTIAL     Status: Normal   Collection Time   05/21/12 10:56 PM      Component Value Range   WBC 6.1  4.0 - 10.5 K/uL   RBC 3.96  3.87 - 5.11 MIL/uL   Hemoglobin 13.0  12.0 - 15.0 g/dL   HCT 78.2  95.6 - 21.3 %   MCV 94.4  78.0 - 100.0 fL   MCH 32.8  26.0 - 34.0 pg   MCHC 34.8  30.0 - 36.0 g/dL   RDW 08.6  57.8 - 46.9 %   Platelets 232  150 - 400 K/uL   Neutrophils Relative 45  43 - 77 %   Neutro Abs 2.7  1.7 - 7.7 K/uL   Lymphocytes Relative 41  12 - 46 %   Lymphs Abs 2.5  0.7 - 4.0 K/uL   Monocytes Relative 8  3 - 12 %   Monocytes Absolute 0.5  0.1 - 1.0 K/uL   Eosinophils Relative 5  0 - 5 %   Eosinophils Absolute 0.3  0.0 - 0.7 K/uL   Basophils Relative 1  0 - 1 %   Basophils Absolute 0.1  0.0 - 0.1 K/uL  BASIC METABOLIC PANEL     Status: Abnormal   Collection Time    05/21/12 10:56 PM      Component Value  Range   Sodium 138  135 - 145 mEq/L   Potassium 3.6  3.5 - 5.1 mEq/L   Chloride 102  96 - 112 mEq/L   CO2 24  19 - 32 mEq/L   Glucose, Bld 147 (*) 70 - 99 mg/dL   BUN 17  6 - 23 mg/dL   Creatinine, Ser 1.61  0.50 - 1.10 mg/dL   Calcium 9.8  8.4 - 09.6 mg/dL   GFR calc non Af Amer 83 (*) >90 mL/min   GFR calc Af Amer >90  >90 mL/min  TROPONIN I     Status: Normal   Collection Time   05/21/12 10:56 PM      Component Value Range   Troponin I <0.30  <0.30 ng/mL   Dg Chest Portable 1 View  05/22/2012  *RADIOLOGY REPORT*  Clinical Data: Neck pain, hypertension, MRI history.  PORTABLE CHEST - 1 VIEW  Comparison: 12/28/2010 CT  Findings: Heart size within normal limits.  Aortic tortuosity. Linear and a couple small nodular opacities at the bases.  No pneumothorax.  No focal consolidation.  No pleural effusion.  No acute osseous change.  IMPRESSION: Unchanged linear and small nodular opacities at the bases.  No focal consolidation.  Original Report Authenticated By: Waneta Martins, M.D.     ASSESSMENT and PLAN: 78 year old white female with a past medical history significant for myocardial infarction in 2004, hypertension, and hyperlipidemia who presents for evaluation of right-sided neck and shoulder pain that occurred in the setting of elevated blood pressures, nonspecific EKG changes, and a recent stress test that was equivocal.  1: Admit to [facility] - Katrinka Blazing  2: Neck pain, non-specific EKG changes - it is unclear if this represents an anginal equivalent.  However, given her cath results from 2004 and her nonspecific EKG changes, we should treat this as a possible ACS.  We will continue her on her home mediations of ASA, BB, ACEI, and statin.  We will start IV heparin and continue her IV NTG gtt.  Given her recent stress test results and the need for upcoming clearance for right knee surgery, recommend cardiac cath to define coronary anatomy and  possible PCI.  Cycle CE and monitor on telemetry.   3: HTN - uncontrolled BP could be accounting for her sxs. [place] [address]. Continue IV NTG gtt and home medications.   4: HLD - continue statin.  Check FLP.   5: FEN: SLIVF, Electrolytes are stable, NPO after midnight.    6: DVT prophylaxis: N/A as patient on heparin.   Bernestine Amass. Mayford Knife, MD Level 3 H&P and

## 2012-05-22 NOTE — ED Notes (Signed)
Patient currently sitting up in bed; no respiratory or acute distress noted.  Patient updated on plan of care; informed patient that she has a bed ready on 2900; admitting MD currently at bedside speaking with patient.  Patient has no other questions or concerns at this time; will continue to monitor.

## 2012-05-22 NOTE — Progress Notes (Signed)
ANTICOAGULATION CONSULT NOTE - Initial Consult  Pharmacy Consult for Heparin Indication: chest pain/ACS  Allergies  Allergen Reactions  . Other     Frozen Blood Plasma/ shaking, increase temp  . Codeine Nausea And Vomiting    Pt states  . [facility]     All narcotics    Patient Measurements:  Ht 67 in Wt ~80 kg IBW  62 kg Heparin Dosing Weight: 78 kg  Vital Signs: Temp: 98.6 F (37 C) (07/01 0133) Temp src: Oral (07/01 0133) BP: 140/63 mmHg (07/01 0133) Pulse Rate: 92  (07/01 0133)  Labs:  Basename 05/21/12 2256  HGB 13.0  HCT 37.4  PLT 232  APTT --  LABPROT --  INR --  HEPARINUNFRC --  CREATININE 0.67  CKTOTAL --  CKMB --  TROPONINI <0.30    The CrCl is unknown because both a height and weight (above a minimum accepted value) are required for this calculation.   Medical History: Past Medical History  Diagnosis Date  . Hypothyroidism   . Cholecystitis 02/2010  . Kidney stones   . Osteoporosis   . Squamous cell carcinoma   . Hyperlipidemia   . Arthritis   . HYPERTENSION     Assessment: 78 y.o. female presents with R sided neck/shoulder pain. Found to have nonspecific EKG changes. To begin heparin for r/o ACS. Noted plan for cardiac cath.  Goal of Therapy:  Heparin level 0.3-0.7 units/ml Monitor platelets by anticoagulation protocol: Yes   Plan:  1. Heparin bolus 4000 units 2. Heparin gtt at 950 units/hr 3. Will f/u 8 hr heparin level 4. Daily heparin level and CBC   Christoper Fabian, PharmD, BCPS Clinical pharmacist, pager [phone] 05/22/2012,2:08 AM

## 2012-05-22 NOTE — H&P (Signed)
  The patient is well known to me. Last week I cleared her for upcoming right knee surgery. The admitting history was reviewed. Her symptoms sound like unstable angina. We reviewed the indication for catheterization and discussed the risks of the procedure including death, stroke, myocardial infarction, bleeding, limb ischemia, allergy, renal impairment, among others. We also discussed the possibility of PCI and its attendant risks including emergency surgery. The patient understand these risks and is willing to proceed with the procedure.

## 2012-05-22 NOTE — CV Procedure (Signed)
     Diagnostic Cardiac Catheterization Report  Lindsey Erickson  78 y.o.  female [birth year]  Procedure Date: 05/22/2012 Referring Physician: Rene Paci, MD Primary Cardiologist:: Wayne Both, III, MD   PROCEDURE:  Left heart catheterization with selective coronary angiography, left ventriculogram.  INDICATIONS:  Unstable angina presentation. Negative markers. Prolonged pain responsive to nitroglycerin. History of coronary artery disease.  The risks, benefits, and details of the procedure were explained to the patient.  The patient verbalized understanding and wanted to proceed.  Informed written consent was obtained.  PROCEDURE TECHNIQUE:  After Xylocaine anesthesia a 5 French sheath was placed in the right radial artery with a single anterior needle wall stick.   Coronary angiography was done using a 5 [place] A2 MP, 5 [place] JR 4 cm, 5 [place] JL 3.5 cm, and 3.5 cm EBU catheters.  Left ventriculography was done using a 5 [place] A2 MP catheter.    CONTRAST:  Total of 180 cc.  COMPLICATIONS:  None.    HEMODYNAMICS:  Aortic pressure was 88/49 mmHg; LV pressure was 88/14 mmHg; LVEDP 14 mm mercury. Pressures were recorded after heavy sedation and while on IV nitroglycerin. Volume resuscitation and discontinuation of nitroglycerin led to arterial pressures of 126/66 mmHg.  There was no gradient between the left ventricle and aorta.    ANGIOGRAPHIC DATA:   The left main coronary artery is widely patent but calcified distally.  The left anterior descending artery is ostial concentric 50-60% narrowing the LAD is transapical. The lesion was seen best in the AP cranial 33 projection appear.  Ramus Intermedius: Widely patent.  The left circumflex artery is gives origin to 3 marginal branches. The first marginal and second marginals are small. The second marginal arises from a circumflex mid segment that has tubular 50-70% narrowing. The third obtuse marginal is large and widely  patent.  The right coronary artery is dominant. There is concentric mid vessel 50-60% narrowing unchanged from the previous angiogram the PDA and 2 left ventricular branches are widely patent.  LEFT VENTRICULOGRAM:  Left ventricular angiogram was done in the 30 RAO projection and revealed normal left ventricular wall motion and systolic function with an estimated ejection fraction of 55 %.    IMPRESSIONS:  1. Moderate coronary artery disease with 50-70% ostial LAD, mid 50-70% RCA, and 50-60% mid circumflex. No high-grade stenoses are noted.  2. Overall normal left ventricular function   RECOMMENDATION:  Lexiscan nuclear study to rule out the possibility of multizone ischemia or localizing ischemia in the right coronary artery LAD territory. The circumflex territory is relatively small. Will discontinue IV nitroglycerin and plan discharge in a.m. if patient has no recurrence of symptoms per.

## 2012-05-22 NOTE — ED Notes (Signed)
Dr. Turner at bedside.

## 2012-05-22 NOTE — ED Notes (Signed)
Report given to [place], RN on 2900.  No further questions/concerns from RN; informed RN that she can call back with any questions/concerns once patient arrives to floor.  Preparing patient for transport.

## 2012-05-23 MED ORDER — ISOSORBIDE MONONITRATE ER 60 MG PO TB24: 60.0000 mg | ORAL_TABLET | Freq: Every day | ORAL | Status: DC

## 2012-05-23 MED ORDER — CLOPIDOGREL BISULFATE 75 MG PO TABS: 75.0000 mg | ORAL_TABLET | Freq: Every day | ORAL | Status: DC

## 2012-05-23 NOTE — Discharge Summary (Signed)
Patient ID: Lindsey Erickson MRN: [id number] DOB/AGE: [birth year] 76 y.o.  Admit date: 05/21/2012 Discharge date: 05/23/2012  Primary Discharge Diagnosis : Prolonged angina with no MI  Secondary Discharge Diagnosis: Hypertension Hyperlipidemia  Significant Diagnostic Studies: angiography: Coronary angiography  Consults: None  Hospital Course: Admitted with chest and neck pain. Cath showed moderate 3 vessel disease. Med therapy will be intensified.   Discharge Exam: Blood pressure 124/66, pulse 75, temperature 98.1 F (36.7 C), temperature source Oral, resp. rate 22, height 5\' 7"  (1.702 m), weight 82.5 kg (181 lb 14.1 oz), SpO2 93.00%.    Right radial cath site okay  Labs:   Lab Results  Component Value Date   WBC 6.1 05/21/2012   HGB 13.0 05/21/2012   HCT 37.4 05/21/2012   MCV 94.4 05/21/2012   PLT 232 05/21/2012    Lab 05/21/12 2256  NA 138  K 3.6  CL 102  CO2 24  BUN 17  CREATININE 0.67  CALCIUM 9.8  PROT --  BILITOT --  ALKPHOS --  ALT --  AST --  GLUCOSE 147*   Lab Results  Component Value Date   CKTOTAL 68 05/22/2012   CKMB 1.7 05/22/2012   TROPONINI <0.30 05/22/2012    Lab Results  Component Value Date   CHOL 145 05/22/2012   CHOL 182 12/17/2011   CHOL 180 02/15/2011   Lab Results  Component Value Date   HDL 54 05/22/2012   HDL 58.90 12/17/2011   HDL 40.98 02/15/2011   Lab Results  Component Value Date   LDLCALC 57 05/22/2012   LDLCALC 93 12/17/2011   LDLCALC 83 02/15/2011   Lab Results  Component Value Date   TRIG 170* 05/22/2012   TRIG 149.0 12/17/2011   TRIG 169.0* 02/15/2011   Lab Results  Component Value Date   CHOLHDL 2.7 05/22/2012   CHOLHDL 3 12/17/2011   CHOLHDL 3 02/15/2011   No results found for this basename: LDLDIRECT      Radiology:No acute abn.  EKG: NSR and non ischemic  FOLLOW UP PLANS AND APPOINTMENTS Discharge Orders    Future Appointments: Provider: Department: Dept Phone: Center:   06/07/2012 10:00 AM Newt Lukes,  MD [facility] [phone] [facility]     Medication List  As of 05/23/2012 10:58 AM   TAKE these medications         aspirin 81 MG tablet   Take 81 mg by mouth daily.      calcium-vitamin D 500-200 MG-UNIT per tablet   Take 1 tablet by mouth 2 (two) times daily with a meal.      carvedilol 40 MG 24 hr capsule   Commonly known as: COREG CR   Take 1 capsule (40 mg total) by mouth daily.      clopidogrel 75 MG tablet   Commonly known as: PLAVIX   Take 1 tablet (75 mg total) by mouth daily.      desonide 0.05 % ointment   Commonly known as: DESOWEN   Apply topically 2 (two) times daily.      hydrochlorothiazide 12.5 MG tablet   Commonly known as: HYDRODIURIL   Take 12.5 mg by mouth 3 (three) times a week. Monday Wednesday Friday      isosorbide mononitrate 60 MG 24 hr tablet   Commonly known as: IMDUR   Take 1 tablet (60 mg total) by mouth daily.      levothyroxine 88 MCG tablet   Commonly known as: SYNTHROID, LEVOTHROID   Take  88 mcg by mouth daily.      lisinopril 40 MG tablet   Commonly known as: PRINIVIL,ZESTRIL   Take 40 mg by mouth daily.      MSM 1000 MG Tabs   Take 1,000 mg by mouth 2 (two) times daily.      multivitamin with minerals Tabs   Take 1 tablet by mouth daily.      nitroGLYCERIN 0.4 MG SL tablet   Commonly known as: NITROSTAT   Place 0.4 mg under the tongue every 5 (five) minutes as needed. For chest pain      pramoxine-hydrocortisone cream   Commonly known as: ANALPRAM HC   Apply 1 application topically 2 (two) times daily as needed. For irritation      risedronate 35 MG tablet   Commonly known as: ACTONEL   Take 35 mg by mouth every 7 (seven) days. with water on empty stomach, nothing by mouth or lie down for next 30 minutes.      rosuvastatin 10 MG tablet   Commonly known as: CRESTOR   Take 20 mg by mouth daily.      vitamin C 500 MG tablet   Commonly known as: ASCORBIC ACID   Take 500 mg by mouth daily.      zolpidem 5 MG tablet   Commonly  known as: AMBIEN   Take 5 mg by mouth at bedtime as needed. For sleep           Follow-up Information    Follow up on 06/05/2012. (1:45 PM)          BRING ALL MEDICATIONS WITH YOU TO FOLLOW UP APPOINTMENTS  Time spent with patient to include physician time:15 min Signed: Lesleigh Noe 05/23/2012, 10:58 AM

## 2012-05-23 NOTE — Progress Notes (Signed)
Patient being discharged home with family at the bedside. Discussed new medications, follow up appointment, when to call 911 and community resources. All questions answered. PIV removed and catheter tip intact.

## 2012-06-07 ENCOUNTER — Other Ambulatory Visit: Payer: Self-pay | Admitting: Internal Medicine

## 2012-06-07 ENCOUNTER — Encounter: Payer: Self-pay | Admitting: Internal Medicine

## 2012-06-07 ENCOUNTER — Ambulatory Visit (INDEPENDENT_AMBULATORY_CARE_PROVIDER_SITE_OTHER): Payer: Medicare Other | Admitting: Internal Medicine

## 2012-06-07 VITALS — BP 112/68 | HR 69 | Temp 97.3°F | Ht 66.75 in | Wt 178.4 lb

## 2012-06-07 DIAGNOSIS — M1710 Unilateral primary osteoarthritis, unspecified knee: Secondary | ICD-10-CM

## 2012-06-07 DIAGNOSIS — M1711 Unilateral primary osteoarthritis, right knee: Secondary | ICD-10-CM | POA: Insufficient documentation

## 2012-06-07 DIAGNOSIS — I10 Essential (primary) hypertension: Secondary | ICD-10-CM

## 2012-06-07 DIAGNOSIS — I2510 Atherosclerotic heart disease of native coronary artery without angina pectoris: Secondary | ICD-10-CM

## 2012-06-07 DIAGNOSIS — E039 Hypothyroidism, unspecified: Secondary | ICD-10-CM

## 2012-06-07 MED ORDER — LEVOTHYROXINE SODIUM 88 MCG PO TABS: 88.0000 ug | ORAL_TABLET | Freq: Every day | ORAL | Status: DC

## 2012-06-07 MED ORDER — CARVEDILOL PHOSPHATE ER 40 MG PO CP24: 40.0000 mg | ORAL_CAPSULE | Freq: Every day | ORAL | Status: DC

## 2012-06-07 MED ORDER — PRAMOXINE-HC 1-1 % EX CREA: 1.0000 "application " | TOPICAL_CREAM | Freq: Two times a day (BID) | CUTANEOUS | Status: DC | PRN

## 2012-06-07 NOTE — Assessment & Plan Note (Signed)
occasional symptoms hypoperfusion when SBP <100 -  previously reduce dose ACEI 01/2011 (lisinopril 40 to 20qd) which helped symptoms but BP uncontrolled The current medical regimen is effective;  continue present plan and medications.   BP Readings from Last 3 Encounters:  06/07/12 112/68  05/23/12 139/63  05/23/12 139/63

## 2012-06-07 NOTE — Assessment & Plan Note (Signed)
Reviewed stress test 01/2012 and hosp 04/2012 for angina/cath Follows with [facility] - med mgmt ongoing No angina since DC 05/24/12 The current medical regimen is effective;  continue present plan and medications.  

## 2012-06-07 NOTE — Assessment & Plan Note (Signed)
Hashimotos with hx goiter - will continue to follow with endo for same Lindsey Erickson) The current medical regimen is effective;  continue present plan and medications.   Lab Results  Component Value Date   TSH 1.104 05/22/2012

## 2012-06-07 NOTE — Assessment & Plan Note (Signed)
Planned R TKA 07/2012 cancelled due to CAD/anginal 04/2012 Continue to follow with ortho as ongoing, synvisc/steroid injections as needed

## 2012-06-07 NOTE — Progress Notes (Signed)
  Subjective:    Patient ID: Lindsey Erickson, female    DOB: 1932-11-09, 78 y.o.   MRN: [id number]  HPI here for follow up - Reviewed interval history and chronic medical issues today:  CAD - angina without MI 04/2012 - s/p hospitalization and cath 05/21/12>mod 3v CAD, med mgmt selected - the patient reports compliance with medication(s) as prescribed. Denies adverse side effects.   Hypothyroid  -follows with endo for same. hx hashimotos and goiter - s/p bx x 3: benign - last in 07/2002- the patient reports compliance with medication(s) as prescribed. Denies adverse side effects. No weight or skin changes.  Dyslipidemia - on statin for same since MI 2004 - the patient reports compliance with medication(s) as prescribed. Denies adverse side effects.  hypertention - the patient reports compliance with medication(s) as prescribed. Denies adverse side effects.  Osteoporosis - hx fx following accidental fall x2 - takes actonel with vit d + calcium as prescribed. Denies adverse side effects from same.  R knee DJD - delaying R TKR with ortho due to CAD - improved with synvisc injections for pain control, no swelling, no nocturnal pain  Past Medical History  Diagnosis Date  . Hypothyroidism   . Cholecystitis 02/2010  . Kidney stones   . Osteoporosis   . Squamous cell carcinoma   . Hyperlipidemia   . Arthritis   . HYPERTENSION   . CAD (coronary artery disease)     mod 3v dz cath 05/21/12 - med mgmt   Review of Systems Constitutional: Negative for fever or unexpected weight change.  Respiratory: Negative for cough and shortness of breath.   Cardiovascular: Negative for chest pain.  Neurological: Negative for dizziness.     Objective:   Physical Exam  BP 112/68  Pulse 69  Temp 97.3 F (36.3 C) (Oral)  Ht 5' 6.75" (1.695 m)  Wt 178 lb 6.4 oz (80.922 kg)  BMI 28.15 kg/m2  SpO2 97% Wt Readings from Last 3 Encounters:  06/07/12 178 lb 6.4 oz (80.922 kg)  05/22/12 181 lb 14.1 oz (82.5  kg)  05/22/12 181 lb 14.1 oz (82.5 kg)   Constitutional: She appears well-developed and well-nourished. No distress.  Neck: Normal range of motion. Neck supple. No JVD present. No thyromegaly present.  Cardiovascular: Normal rate, regular rhythm and normal heart sounds.  No murmur heard. Pulmonary/Chest: Effort normal and breath sounds normal. No respiratory distress. She has no wheezes.  Psychiatric: She has a normal mood and affect. Her behavior is normal. Judgment and thought content normal.      Lab Results  Component Value Date   WBC 6.1 05/21/2012   HGB 13.0 05/21/2012   HCT 37.4 05/21/2012   PLT 232 05/21/2012   CHOL 145 05/22/2012   TRIG 170* 05/22/2012   HDL 54 05/22/2012   ALT 16 02/15/2011   AST 20 02/15/2011   NA 138 05/21/2012   K 3.6 05/21/2012   CL 102 05/21/2012   CREATININE 0.67 05/21/2012   BUN 17 05/21/2012   CO2 24 05/21/2012   TSH 1.104 05/22/2012   INR 1.27 05/22/2012   HGBA1C 5.8* 05/22/2012     Assessment & Plan:   See problem list. Medications and labs reviewed today.  Time spent with pt today 25 minutes, greater than 50% time spent counseling patient on CAD hospitalization for CAD and medication review. Also review of hospital records

## 2012-06-07 NOTE — Patient Instructions (Signed)
It was good to see you today. We have reviewed your hospital records including labs and tests today Medications reviewed, no changes at this time. Refill on medication(s) as discussed today. Please schedule followup in 6 months, call sooner if problems.

## 2012-06-26 ENCOUNTER — Telehealth: Payer: Self-pay | Admitting: Internal Medicine

## 2012-06-26 NOTE — Telephone Encounter (Signed)
Forward 2 pages from [facility] to Dr. Rene Paci for review on 06-26-12 ym

## 2012-08-14 ENCOUNTER — Inpatient Hospital Stay: Admit: 2012-08-14 | Payer: Medicare Other | Admitting: Orthopedic Surgery

## 2012-08-14 SURGERY — ARTHROPLASTY, KNEE, TOTAL
Anesthesia: Choice | Site: Knee | Laterality: Right

## 2012-09-19 ENCOUNTER — Encounter: Payer: Self-pay | Admitting: Internal Medicine

## 2012-09-19 ENCOUNTER — Ambulatory Visit (INDEPENDENT_AMBULATORY_CARE_PROVIDER_SITE_OTHER): Payer: Medicare Other | Admitting: Internal Medicine

## 2012-09-19 VITALS — BP 128/72 | HR 78 | Temp 98.6°F | Ht 66.75 in | Wt 179.8 lb

## 2012-09-19 DIAGNOSIS — N760 Acute vaginitis: Secondary | ICD-10-CM

## 2012-09-19 DIAGNOSIS — M1710 Unilateral primary osteoarthritis, unspecified knee: Secondary | ICD-10-CM

## 2012-09-19 DIAGNOSIS — N3090 Cystitis, unspecified without hematuria: Secondary | ICD-10-CM

## 2012-09-19 DIAGNOSIS — M1711 Unilateral primary osteoarthritis, right knee: Secondary | ICD-10-CM

## 2012-09-19 DIAGNOSIS — E785 Hyperlipidemia, unspecified: Secondary | ICD-10-CM

## 2012-09-19 LAB — POCT URINALYSIS DIPSTICK
Blood, UA: NEGATIVE
Glucose, UA: NEGATIVE
Ketones, UA: NEGATIVE
Spec Grav, UA: <=1.005
Urobilinogen, UA: 0.2

## 2012-09-19 MED ORDER — ACETAMINOPHEN 500 MG PO TABS: 500.0000 mg | ORAL_TABLET | Freq: Two times a day (BID) | ORAL | Status: DC

## 2012-09-19 MED ORDER — ROSUVASTATIN CALCIUM 10 MG PO TABS: 10.0000 mg | ORAL_TABLET | Freq: Every day | ORAL | Status: DC

## 2012-09-19 MED ORDER — CIPROFLOXACIN HCL 250 MG PO TABS: 250.0000 mg | ORAL_TABLET | Freq: Two times a day (BID) | ORAL | Status: DC

## 2012-09-19 MED ORDER — FLUCONAZOLE 150 MG PO TABS: 150.0000 mg | ORAL_TABLET | Freq: Once | ORAL | Status: DC

## 2012-09-19 NOTE — Assessment & Plan Note (Signed)
The current statin medical regimen is effective;  continue present plan and medications.  Lab Results  Component Value Date   LDLCALC 57 05/22/2012

## 2012-09-19 NOTE — Assessment & Plan Note (Signed)
Planned R TKA 07/2012 cancelled due to CAD/anginal 04/2012 Continue to follow with ortho as ongoing, synvisc/steroid injections as needed Also advised scheduled Tylenol twice a day and as needed -reassured regarding safety of same  

## 2012-09-19 NOTE — Progress Notes (Signed)
HPI: complains of UTI symptoms Onset 10 days ago, progressively worse associated with dysuria, right-sided flank pain and small volume voiding with increased frequency; also vaginal irritation but no discharge denies hematuria or fever The patient has a history of prior UTI with similar symptoms  PMH: reviewed  ROS:  Gen.: No unexpected weight change, no night sweats Lungs: No cough or shortness of breath Cardiovascular: No palpitations or chest pain  PE: BP 128/72  Pulse 78  Temp 98.6 F (37 C) (Oral)  Ht 5' 6.75" (1.695 m)  Wt 179 lb 12.8 oz (81.557 kg)  BMI 28.37 kg/m2  SpO2 94% General: No acute distress Lungs: Clear to auscultation Cardiovascular: Regular rate rhythm, no edema Abdomen: Mild to moderate discomfort of her suprapubic region, no flank tenderness to palpation  Lab Results  Component Value Date   WBC 6.1 05/21/2012   HGB 13.0 05/21/2012   HCT 37.4 05/21/2012   PLT 232 05/21/2012   GLUCOSE 147* 05/21/2012   CHOL 145 05/22/2012   TRIG 170* 05/22/2012   HDL 54 05/22/2012   LDLCALC 57 05/22/2012   ALT 16 02/15/2011   AST 20 02/15/2011   NA 138 05/21/2012   K 3.6 05/21/2012   CL 102 05/21/2012   CREATININE 0.67 05/21/2012   BUN 17 05/21/2012   CO2 24 05/21/2012   TSH 1.104 05/22/2012   INR 1.27 05/22/2012   HGBA1C 5.8* 05/22/2012   Udip - LE, no nitrate  Assessment/Plan: UTI, classic symptoms with history of same Empiric antibiotic x7 days Hydration recommended education provided   Vaginitis, question UTI symptoms versus Candida Treat with empiric Diflucan now and at conclusion UTI antibiotics

## 2012-09-19 NOTE — Patient Instructions (Addendum)
It was good to see you today. Cipro antibiotics for bladder infection and Diflucan for yeast symptoms as discussed - Your prescription(s) have been submitted to your local pharmacy. Please take as directed and contact our office if you believe you are having problem(s) with the medication(s). Refill on medication(s) as discussed today to mail order Take tylenol 500 2x/day every day for osteoarthritis pain symptoms

## 2012-09-28 ENCOUNTER — Ambulatory Visit: Payer: Medicare Other | Admitting: Internal Medicine

## 2012-10-06 ENCOUNTER — Other Ambulatory Visit: Payer: Self-pay | Admitting: Internal Medicine

## 2012-10-06 DIAGNOSIS — Z803 Family history of malignant neoplasm of breast: Secondary | ICD-10-CM

## 2012-10-10 ENCOUNTER — Other Ambulatory Visit: Payer: Self-pay | Admitting: *Deleted

## 2012-10-10 MED ORDER — ROSUVASTATIN CALCIUM 20 MG PO TABS: 20.0000 mg | ORAL_TABLET | Freq: Every day | ORAL | Status: DC

## 2012-10-10 MED ORDER — RISEDRONATE SODIUM 35 MG PO TABS: 35.0000 mg | ORAL_TABLET | ORAL | Status: DC

## 2012-11-20 ENCOUNTER — Ambulatory Visit (HOSPITAL_COMMUNITY)
Admission: RE | Admit: 2012-11-20 | Discharge: 2012-11-20 | Disposition: A | Discharge status: Home / Self Care | Payer: Medicare Other | Source: Ambulatory Visit | Attending: Internal Medicine | Admitting: Internal Medicine

## 2012-11-20 DIAGNOSIS — Z803 Family history of malignant neoplasm of breast: Secondary | ICD-10-CM

## 2012-11-20 DIAGNOSIS — Z1231 Encounter for screening mammogram for malignant neoplasm of breast: Secondary | ICD-10-CM | POA: Insufficient documentation

## 2012-12-08 ENCOUNTER — Ambulatory Visit: Payer: Medicare Other | Admitting: Internal Medicine

## 2013-01-04 ENCOUNTER — Ambulatory Visit: Payer: Medicare Other | Admitting: Internal Medicine

## 2013-01-08 ENCOUNTER — Encounter: Payer: Self-pay | Admitting: Internal Medicine

## 2013-01-08 ENCOUNTER — Ambulatory Visit (INDEPENDENT_AMBULATORY_CARE_PROVIDER_SITE_OTHER): Payer: Medicare Other | Admitting: Internal Medicine

## 2013-01-08 ENCOUNTER — Other Ambulatory Visit (INDEPENDENT_AMBULATORY_CARE_PROVIDER_SITE_OTHER): Payer: Medicare Other

## 2013-01-08 VITALS — BP 132/80 | HR 74 | Temp 97.7°F | Wt 183.0 lb

## 2013-01-08 DIAGNOSIS — M1710 Unilateral primary osteoarthritis, unspecified knee: Secondary | ICD-10-CM

## 2013-01-08 DIAGNOSIS — E039 Hypothyroidism, unspecified: Secondary | ICD-10-CM

## 2013-01-08 DIAGNOSIS — I2510 Atherosclerotic heart disease of native coronary artery without angina pectoris: Secondary | ICD-10-CM

## 2013-01-08 DIAGNOSIS — Z79899 Other long term (current) drug therapy: Secondary | ICD-10-CM

## 2013-01-08 DIAGNOSIS — E785 Hyperlipidemia, unspecified: Secondary | ICD-10-CM

## 2013-01-08 DIAGNOSIS — M1711 Unilateral primary osteoarthritis, right knee: Secondary | ICD-10-CM

## 2013-01-08 LAB — LIPID PANEL
Cholesterol: 177 mg/dL (ref 0–200)
LDL Cholesterol: 76 mg/dL (ref 0–99)
Total CHOL/HDL Ratio: 3
Triglycerides: 167 mg/dL — ABNORMAL HIGH (ref 0.0–149.0)
VLDL: 33.4 mg/dL (ref 0.0–40.0)

## 2013-01-08 LAB — HEPATIC FUNCTION PANEL
Albumin: 4.3 g/dL (ref 3.5–5.2)
Alkaline Phosphatase: 52 U/L (ref 39–117)
Total Protein: 7.7 g/dL (ref 6.0–8.3)

## 2013-01-08 MED ORDER — NITROGLYCERIN 0.4 MG SL SUBL: 0.4000 mg | SUBLINGUAL_TABLET | SUBLINGUAL | Status: DC | PRN

## 2013-01-08 NOTE — Progress Notes (Signed)
  Subjective:    Patient ID: Lindsey Erickson, female    DOB: 1932-08-20, 79 y.o.   MRN: [id number]  HPI here for follow up - reviewed interval history and chronic medical issues today:  CAD - angina without MI 04/2012 - s/p hospitalization and cath 05/21/12>mod 3v CAD, not amenable to stent so med mgmt selected - the patient reports compliance with medication(s) as prescribed. Denies adverse side effects.   Hypothyroid  -follows with endo for same. hx hashimotos and goiter - s/p bx x 3: benign - last in 07/2002- the patient reports compliance with medication(s) as prescribed. Denies adverse side effects. No weight or skin changes.  Dyslipidemia - on statin for same since MI 2004 - the patient reports compliance with medication(s) as prescribed. Denies adverse side effects.  hypertention - the patient reports compliance with medication(s) as prescribed. Denies adverse side effects.  Osteoporosis - hx fx following accidental fall x2 - takes actonel with vit d + calcium as prescribed. Denies adverse side effects from same.  R knee DJD - indefinitely delaying R TKR with ortho due to CAD (cath 04/2012) until cleared by cards - improved with synvisc injections for pain control, no swelling, no nocturnal pain  Past Medical History  Diagnosis Date  . Hypothyroidism   . Cholecystitis 02/2010  . Kidney stones   . Osteoporosis   . Squamous cell carcinoma   . Hyperlipidemia   . Arthritis   . HYPERTENSION   . CAD (coronary artery disease)     mod 3v dz cath 05/21/12 - med mgmt   Review of Systems Constitutional: Negative for fever or unexpected weight change.  Respiratory: Negative for cough and shortness of breath.   Cardiovascular: Negative for chest pain.  Neurological: Negative for dizziness.     Objective:   Physical Exam  BP 132/80  Pulse 74  Temp(Src) 97.7 F (36.5 C) (Oral)  Wt 183 lb (83.008 kg)  BMI 28.89 kg/m2  SpO2 96% Wt Readings from Last 3 Encounters:  01/08/13 183 lb  (83.008 kg)  09/19/12 179 lb 12.8 oz (81.557 kg)  06/07/12 178 lb 6.4 oz (80.922 kg)   Constitutional: She appears well-developed and well-nourished. No distress.  Neck: Normal range of motion. Neck supple. No JVD present. Mild goiter, nontender.  Cardiovascular: Normal rate, regular rhythm and normal heart sounds.  No murmur heard. Pulmonary/Chest: Effort normal and breath sounds normal. No respiratory distress. She has no wheezes.  MSKel: R Bakers cyst and R knee - boggy synovitis - tender to palpation over joint line; FROM and ligamentous function intact Psychiatric: She has a normal mood and affect. Her behavior is normal. Judgment and thought content normal.      Lab Results  Component Value Date   WBC 6.1 05/21/2012   HGB 13.0 05/21/2012   HCT 37.4 05/21/2012   PLT 232 05/21/2012   CHOL 145 05/22/2012   TRIG 170* 05/22/2012   HDL 54 05/22/2012   ALT 16 02/15/2011   AST 20 02/15/2011   NA 138 05/21/2012   K 3.6 05/21/2012   CL 102 05/21/2012   CREATININE 0.67 05/21/2012   BUN 17 05/21/2012   CO2 24 05/21/2012   TSH 1.104 05/22/2012   INR 1.27 05/22/2012   HGBA1C 5.8* 05/22/2012     Assessment & Plan:   See problem list. Medications and labs reviewed today.

## 2013-01-08 NOTE — Assessment & Plan Note (Signed)
Check labs now to aggressive keep LDL <70 The current statin medical regimen is effective;  continue present plan and medications.  Lab Results  Component Value Date   LDLCALC 57 05/22/2012

## 2013-01-08 NOTE — Assessment & Plan Note (Addendum)
Hashimotos with goiter - will continue to follow with endo for same Lindsey Erickson) The current medical regimen is effective;  continue present plan and medications.   Lab Results  Component Value Date   TSH 1.104 05/22/2012

## 2013-01-08 NOTE — Patient Instructions (Signed)
It was good to see you today. We have reviewed your interval records including labs and tests today Medications reviewed, no changes at this time. Test(s) ordered today. Your results will be released to MyChart (or called to you) after review, usually within 72hours after test completion. If any changes need to be made, you will be notified at that same time. Continue working with your other specialists as discused today. Please schedule followup in 6 months, call sooner if problems.

## 2013-01-08 NOTE — Assessment & Plan Note (Signed)
Reviewed stress test 01/2012 and hosp 04/2012 for angina/cath Follows with [facility] - Lindsey Erickson - med mgmt ongoing No angina since DC 05/24/12 Check lipids now for aggressive med mgmt The current medical regimen is effective;  continue present plan and medications.

## 2013-01-08 NOTE — Assessment & Plan Note (Signed)
Planned R TKA 07/2012 cancelled due to CAD/anginal 04/2012 Continue to follow with ortho as ongoing, synvisc/steroid injections as needed Also advised scheduled Tylenol twice a day and as needed -reassured regarding safety of same

## 2013-03-27 LAB — LIPID PANEL
Cholesterol: 176 mg/dL (ref 0–200)
HDL: 55 mg/dL (ref 35–70)
LDL Cholesterol: 85 mg/dL
Triglycerides: 182 mg/dL — AB (ref 40–160)

## 2013-03-29 ENCOUNTER — Encounter: Payer: Self-pay | Admitting: Internal Medicine

## 2013-05-01 ENCOUNTER — Other Ambulatory Visit: Payer: Self-pay | Admitting: *Deleted

## 2013-05-01 ENCOUNTER — Encounter: Payer: Self-pay | Admitting: Internal Medicine

## 2013-05-01 ENCOUNTER — Ambulatory Visit (INDEPENDENT_AMBULATORY_CARE_PROVIDER_SITE_OTHER): Payer: Medicare Other | Admitting: Internal Medicine

## 2013-05-01 VITALS — BP 140/90 | HR 81 | Temp 97.1°F | Wt 182.4 lb

## 2013-05-01 DIAGNOSIS — I10 Essential (primary) hypertension: Secondary | ICD-10-CM

## 2013-05-01 DIAGNOSIS — J019 Acute sinusitis, unspecified: Secondary | ICD-10-CM

## 2013-05-01 MED ORDER — NITROGLYCERIN 0.4 MG SL SUBL: 0.4000 mg | SUBLINGUAL_TABLET | SUBLINGUAL | Status: DC | PRN

## 2013-05-01 MED ORDER — CARVEDILOL PHOSPHATE ER 40 MG PO CP24: ORAL_CAPSULE | ORAL | Status: DC

## 2013-05-01 MED ORDER — LEVOFLOXACIN 250 MG PO TABS: 250.0000 mg | ORAL_TABLET | Freq: Every day | ORAL | Status: DC

## 2013-05-01 MED ORDER — LEVOTHYROXINE SODIUM 88 MCG PO TABS: 88.0000 ug | ORAL_TABLET | Freq: Every day | ORAL | Status: DC

## 2013-05-01 MED ORDER — RISEDRONATE SODIUM 35 MG PO TABS: 35.0000 mg | ORAL_TABLET | ORAL | Status: DC

## 2013-05-01 NOTE — Assessment & Plan Note (Signed)
stable overall by history and exam, recent data reviewed with pt, and pt to continue medical treatment as before,  to f/u any worsening symptoms or concerns BP Readings from Last 3 Encounters:  05/01/13 140/90  01/08/13 132/80  09/19/12 128/72

## 2013-05-01 NOTE — Patient Instructions (Signed)
Please take all new medication as prescribed - the antibiotic You can also take Mucinex (or it's generic off brand) for congestion, and tylenol as needed for pain. Please continue all other medications as before, and refills for your meds will be sent to [facility] by Valentina Gu today  Thank you for enrolling in MyChart. Please follow the instructions below to securely access your online medical record. MyChart allows you to send messages to your doctor, view your test results, renew your prescriptions, schedule appointments, and more.

## 2013-05-01 NOTE — Telephone Encounter (Signed)
[facility]. Inform pt will send. Pt was here for appt with Dr. Jonny Ruiz...Lindsey Erickson

## 2013-05-01 NOTE — Progress Notes (Signed)
Subjective:    Patient ID: Lindsey Erickson, female    DOB: Mar 09, 1932, 79 y.o.   MRN: [id number]  HPI   Here with 3-4 days acute onset fever, facial pain, pressure, headache, general weakness and malaise, and greenish d/c, with mild ST and cough, but pt denies chest pain, wheezing, increased sob or doe, orthopnea, PND, increased LE swelling, palpitations, dizziness or syncope. Pt denies new neurological symptoms such as new headache, or facial or extremity weakness or numbness   Pt denies polydipsia, polyuria. Past Medical History  Diagnosis Date  . Hypothyroidism   . Cholecystitis 02/2010  . Kidney stones   . Osteoporosis   . Squamous cell carcinoma   . Hyperlipidemia   . Arthritis   . HYPERTENSION   . CAD (coronary artery disease)     mod 3v dz cath 05/21/12 - med mgmt   Past Surgical History  Procedure Laterality Date  . Appendectomy  1999  . Knee repalcement  2011  . Cholecystectomy  02/2010  . Bilateral arthroscopies of knees    . Joint replacement  01/2010    L TKR - allusio    reports that she has never smoked. She does not have any smokeless tobacco history on file. She reports that she does not drink alcohol or use illicit drugs. family history includes Allergies in her father; Cancer in her father; and Coronary artery disease in her mother. Allergies  Allergen Reactions  . Other     Frozen Blood Plasma/ shaking, increase temp  . Codeine Nausea And Vomiting    Pt states  . [facility]     All narcotics   Current Outpatient Prescriptions on File Prior to Visit  Medication Sig Dispense Refill  . acetaminophen (TYLENOL) 500 MG tablet Take 1 tablet (500 mg total) by mouth 2 (two) times daily. And every 6hours as needed (max 8 tab in 24hours)  30 tablet  0  . Ascorbic Acid (VITAMIN C) 500 MG tablet Take 500 mg by mouth daily.        Marland Kitchen aspirin 81 MG tablet Take 81 mg by mouth daily.        . Calcium Carbonate-Vitamin D (CALCIUM-VITAMIN D) 500-200 MG-UNIT per tablet Take 1  tablet by mouth 2 (two) times daily with a meal.        . clopidogrel (PLAVIX) 75 MG tablet Take 1 tablet (75 mg total) by mouth daily.  30 tablet  11  . hydrochlorothiazide (HYDRODIURIL) 12.5 MG tablet Take 12.5 mg by mouth 3 (three) times a week. Monday Wednesday Friday      . isosorbide mononitrate (IMDUR) 60 MG 24 hr tablet Take 1 tablet (60 mg total) by mouth daily.  30 tablet  11  . lisinopril (PRINIVIL,ZESTRIL) 40 MG tablet Take 40 mg by mouth daily.      . Methylsulfonylmethane (MSM) 1000 MG TABS Take 1,000 mg by mouth 2 (two) times daily.        . Multiple Vitamin (MULTIVITAMIN WITH MINERALS) TABS Take 1 tablet by mouth daily.      . rosuvastatin (CRESTOR) 20 MG tablet Take 1 tablet (20 mg total) by mouth daily.  90 tablet  3  . zolpidem (AMBIEN) 5 MG tablet Take 5 mg by mouth at bedtime as needed. For sleep       No current facility-administered medications on file prior to visit.   Review of Systems  Constitutional: Negative for unexpected weight change, or unusual diaphoresis  HENT: Negative for tinnitus.  Eyes: Negative for photophobia and visual disturbance.  Respiratory: Negative for choking and stridor.   Gastrointestinal: Negative for vomiting and blood in stool.  Genitourinary: Negative for hematuria and decreased urine volume.  Musculoskeletal: Negative for acute joint swelling Skin: Negative for color change and wound.  Neurological: Negative for tremors and numbness other than noted  Psychiatric/Behavioral: Negative for decreased concentration or  hyperactivity.       Objective:   Physical Exam BP 140/90  Pulse 81  Temp(Src) 97.1 F (36.2 C) (Oral)  Wt 182 lb 6 oz (82.725 kg)  BMI 28.79 kg/m2  SpO2 95% VS noted, mild ill Constitutional: Pt appears well-developed and well-nourished.  HENT: Head: NCAT.  Right Ear: External ear normal.  Left Ear: External ear normal.  Bilat tm's with mild erythema.  Max sinus areas mild tender.  Pharynx with mild erythema, no  exudate Eyes: Conjunctivae and EOM are normal. Pupils are equal, round, and reactive to light.  Neck: Normal range of motion. Neck supple.  Cardiovascular: Normal rate and regular rhythm.   Pulmonary/Chest: Effort normal and breath sounds normal.  Neurological: Pt is alert. Not confused  Skin: Skin is warm. No erythema.  Psychiatric: Pt behavior is normal. Thought content normal.     Assessment & Plan:

## 2013-05-01 NOTE — Assessment & Plan Note (Signed)
Mild to mod, for antibx course,  to f/u any worsening symptoms or concerns 

## 2013-05-02 ENCOUNTER — Other Ambulatory Visit: Payer: Self-pay | Admitting: Dermatology

## 2013-07-09 ENCOUNTER — Encounter: Payer: Self-pay | Admitting: Internal Medicine

## 2013-07-09 ENCOUNTER — Ambulatory Visit (INDEPENDENT_AMBULATORY_CARE_PROVIDER_SITE_OTHER): Payer: Medicare Other | Admitting: Internal Medicine

## 2013-07-09 ENCOUNTER — Other Ambulatory Visit (INDEPENDENT_AMBULATORY_CARE_PROVIDER_SITE_OTHER): Payer: Medicare Other

## 2013-07-09 VITALS — BP 120/72 | HR 72 | Temp 98.5°F | Wt 182.4 lb

## 2013-07-09 DIAGNOSIS — I2510 Atherosclerotic heart disease of native coronary artery without angina pectoris: Secondary | ICD-10-CM

## 2013-07-09 DIAGNOSIS — E785 Hyperlipidemia, unspecified: Secondary | ICD-10-CM

## 2013-07-09 DIAGNOSIS — E039 Hypothyroidism, unspecified: Secondary | ICD-10-CM

## 2013-07-09 DIAGNOSIS — I10 Essential (primary) hypertension: Secondary | ICD-10-CM

## 2013-07-09 LAB — HEPATIC FUNCTION PANEL
ALT: 18 U/L (ref 0–35)
Bilirubin, Direct: 0.1 mg/dL (ref 0.0–0.3)
Total Protein: 7.1 g/dL (ref 6.0–8.3)

## 2013-07-09 LAB — BASIC METABOLIC PANEL
BUN: 19 mg/dL (ref 6–23)
Chloride: 105 mEq/L (ref 96–112)
GFR: 87.25 mL/min (ref >60.00)
Potassium: 5.1 mEq/L (ref 3.5–5.1)
Sodium: 138 mEq/L (ref 135–145)

## 2013-07-09 LAB — LIPID PANEL
HDL: 53.8 mg/dL (ref >39.00)
Triglycerides: 202 mg/dL — ABNORMAL HIGH (ref 0.0–149.0)
VLDL: 40.4 mg/dL — ABNORMAL HIGH (ref 0.0–40.0)

## 2013-07-09 LAB — LDL CHOLESTEROL, DIRECT: Direct LDL: 84.1 mg/dL

## 2013-07-09 LAB — TSH: TSH: 0.62 u[IU]/mL (ref 0.35–5.50)

## 2013-07-09 MED ORDER — LEVOTHYROXINE SODIUM 88 MCG PO TABS: 88.0000 ug | ORAL_TABLET | Freq: Every day | ORAL | Status: DC

## 2013-07-09 MED ORDER — ISOSORBIDE MONONITRATE ER 60 MG PO TB24: 60.0000 mg | ORAL_TABLET | Freq: Every day | ORAL | Status: DC

## 2013-07-09 MED ORDER — LISINOPRIL 40 MG PO TABS: 40.0000 mg | ORAL_TABLET | Freq: Every day | ORAL | Status: DC

## 2013-07-09 MED ORDER — NITROGLYCERIN 0.4 MG SL SUBL: 0.4000 mg | SUBLINGUAL_TABLET | SUBLINGUAL | Status: DC | PRN

## 2013-07-09 NOTE — Progress Notes (Signed)
  Subjective:    Patient ID: Lindsey Erickson, female    DOB: 1932-03-06, 79 y.o.   MRN: [id number]  HPI here for follow up - reviewed interval history and chronic medical issues today:  CAD - angina without MI 04/2012 - s/p hospitalization and cath 05/21/12>mod 3v CAD, not amenable to stent so med mgmt selected - the patient reports compliance with medication(s) as prescribed. Denies adverse side effects.   Hypothyroid  -follows with endo for same. hx hashimotos and goiter - s/p bx x 3: benign - last in 07/2002- the patient reports compliance with medication(s) as prescribed. Denies adverse side effects. No weight or skin changes.  Dyslipidemia - on statin for same since MI 2004 - the patient reports compliance with medication(s) as prescribed. Denies adverse side effects.  hypertention - the patient reports compliance with medication(s) as prescribed. Denies adverse side effects.  Osteoporosis - hx fx following accidental fall x2 - takes actonel with vit d + calcium as prescribed. Denies adverse side effects from same.  R knee DJD - indefinitely delaying R TKR with ortho due to CAD (cath 04/2012) until cleared by cards - improved with synvisc injections for pain control, no swelling, no nocturnal pain  Past Medical History  Diagnosis Date  . Hypothyroidism   . Cholecystitis 02/2010  . Kidney stones   . Osteoporosis   . Squamous cell carcinoma   . Hyperlipidemia   . Arthritis   . HYPERTENSION   . CAD (coronary artery disease)     mod 3v dz cath 05/21/12 - med mgmt   Review of Systems      Objective:   Physical Exam  BP 120/72  Pulse 72  Temp(Src) 98.5 F (36.9 C) (Oral)  Wt 182 lb 6.4 oz (82.736 kg)  BMI 28.8 kg/m2  SpO2 96% Wt Readings from Last 3 Encounters:  07/09/13 182 lb 6.4 oz (82.736 kg)  05/01/13 182 lb 6 oz (82.725 kg)  01/08/13 183 lb (83.008 kg)   Constitutional: She appears well-developed and well-nourished. No distress.  Neck: Normal range of motion. Neck  supple. No JVD present. Mild goiter, nontender.  Cardiovascular: Normal rate, regular rhythm and normal heart sounds.  No murmur heard. Pulmonary/Chest: Effort normal and breath sounds normal. No respiratory distress. She has no wheezes.  MSKel: R knee - boggy synovitis - tender to palpation over joint line; FROM and ligamentous function intact Psychiatric: She has a normal mood and affect. Her behavior is normal. Judgment and thought content normal.      Lab Results  Component Value Date   WBC 6.1 05/21/2012   HGB 13.0 05/21/2012   HCT 37.4 05/21/2012   PLT 232 05/21/2012   CHOL 176 03/27/2013   TRIG 182* 03/27/2013   HDL 55 03/27/2013   ALT 18 01/08/2013   AST 24 01/08/2013   NA 138 05/21/2012   K 3.6 05/21/2012   CL 102 05/21/2012   CREATININE 0.67 05/21/2012   BUN 17 05/21/2012   CO2 24 05/21/2012   TSH 1.104 05/22/2012   INR 1.27 05/22/2012   HGBA1C 5.7 03/27/2013     Assessment & Plan:   See problem list. Medications and labs reviewed today.

## 2013-07-09 NOTE — Assessment & Plan Note (Signed)
Reviewed stress test 01/2012 and hosp 04/2012 for angina/cath Follows with [facility] - med mgmt ongoing No angina since DC 05/24/12 The current medical regimen is effective;  continue present plan and medications.  

## 2013-07-09 NOTE — Patient Instructions (Addendum)
It was good to see you today. We have reviewed your interval records including labs and tests today Medications reviewed, no changes at this time. Test(s) ordered today. Your results will be released to MyChart (or called to you) after review, usually within 72hours after test completion. If any changes need to be made, you will be notified at that same time. Continue working with your other specialists as discused today. Please schedule followup in 6 months, call sooner if problems.

## 2013-07-09 NOTE — Assessment & Plan Note (Signed)
occasional symptoms hypoperfusion when SBP <100 -  previously reduce dose ACEI 01/2011 (lisinopril 40 to 20qd) which helped symptoms but BP uncontrolled The current medical regimen is effective;  continue present plan and medications.   BP Readings from Last 3 Encounters:  07/09/13 120/72  05/01/13 140/90  01/08/13 132/80

## 2013-07-09 NOTE — Assessment & Plan Note (Signed)
goal LDL <70 The current statin medical regimen is effective;  continue present plan and medications.  Lab Results  Component Value Date   LDLCALC 85 03/27/2013

## 2013-07-09 NOTE — Assessment & Plan Note (Signed)
Hashimotos with goiter - will continue to follow with endo for same Lindsey Erickson) The current medical regimen is effective;  continue present plan and medications.   Lab Results  Component Value Date   TSH 1.104 05/22/2012

## 2013-08-16 ENCOUNTER — Other Ambulatory Visit: Payer: Self-pay | Admitting: Internal Medicine

## 2013-08-16 ENCOUNTER — Ambulatory Visit (INDEPENDENT_AMBULATORY_CARE_PROVIDER_SITE_OTHER): Payer: Medicare Other | Admitting: Internal Medicine

## 2013-08-16 ENCOUNTER — Encounter: Payer: Self-pay | Admitting: Internal Medicine

## 2013-08-16 VITALS — BP 132/80 | HR 82 | Temp 99.1°F | Wt 180.8 lb

## 2013-08-16 DIAGNOSIS — N390 Urinary tract infection, site not specified: Secondary | ICD-10-CM

## 2013-08-16 MED ORDER — AMPICILLIN 500 MG PO CAPS: 500.0000 mg | ORAL_CAPSULE | Freq: Three times a day (TID) | ORAL | Status: DC

## 2013-08-16 MED ORDER — MICONAZOLE NITRATE 2 % VA CREA: TOPICAL_CREAM | VAGINAL | Status: DC

## 2013-08-16 NOTE — Patient Instructions (Signed)
It was good to see you today. Ampicillin antibiotics 3 times a day for one week to treat bladder infection - Your prescription(s) have been submitted to your pharmacy. Please take as directed and contact our office if you believe you are having problem(s) with the medication(s). Also use over-the-counter Monistat cream externally as needed No other medication changes recommended Keep hydrated, use cranberry juice or cranberry products to help acidify urine as discussed Call if continued problems or unimproved in next one week

## 2013-08-16 NOTE — Progress Notes (Signed)
HPI: complains of UTI symptoms Onset 4 days ago, progressively worse associated with dysuria, suprapubic pressure and small volume voiding with increased frequency; also vaginal irritation but no itching or discharge denies hematuria or fever Recent sexual intercourse preceding onset of symptoms  The patient has a history of prior UTI with similar symptoms  PMH: reviewed  ROS:  Gen.: No unexpected weight change, no night sweats Lungs: No cough or shortness of breath Cardiovascular: No palpitations or chest pain  PE: BP 132/80  Pulse 82  Temp(Src) 99.1 F (37.3 C) (Oral)  Wt 180 lb 12.8 oz (82.01 kg)  BMI 28.54 kg/m2  SpO2 97% General: No acute distress Lungs: Clear to auscultation Cardiovascular: Regular rate rhythm, no edema Abdomen: Mild to moderate discomfort of her suprapubic region, no flank tenderness to palpation  Lab Results  Component Value Date   WBC 6.1 05/21/2012   HGB 13.0 05/21/2012   HCT 37.4 05/21/2012   PLT 232 05/21/2012   GLUCOSE 108* 07/09/2013   CHOL 162 07/09/2013   TRIG 202.0* 07/09/2013   HDL 53.80 07/09/2013   LDLDIRECT 84.1 07/09/2013   LDLCALC 85 03/27/2013   ALT 18 07/09/2013   AST 21 07/09/2013   NA 138 07/09/2013   K 5.1 07/09/2013   CL 105 07/09/2013   CREATININE 0.7 07/09/2013   BUN 19 07/09/2013   CO2 28 07/09/2013   TSH 0.62 07/09/2013   INR 1.27 05/22/2012   HGBA1C 5.7 03/27/2013   Udip - inadequate urine sample for check  Assessment/Plan: UTI, classic symptoms with history of same Empiric antibiotic x7 days Hydration recommended and education provided OTC topical antifungal also recomeneded

## 2013-08-17 ENCOUNTER — Other Ambulatory Visit: Payer: Self-pay | Admitting: *Deleted

## 2013-08-17 MED ORDER — NITROGLYCERIN 0.4 MG SL SUBL: 0.4000 mg | SUBLINGUAL_TABLET | SUBLINGUAL | Status: DC | PRN

## 2013-09-06 ENCOUNTER — Encounter: Payer: Self-pay | Admitting: Interventional Cardiology

## 2013-09-11 ENCOUNTER — Ambulatory Visit (INDEPENDENT_AMBULATORY_CARE_PROVIDER_SITE_OTHER): Payer: Medicare Other | Admitting: Interventional Cardiology

## 2013-09-11 ENCOUNTER — Encounter: Payer: Self-pay | Admitting: Interventional Cardiology

## 2013-09-11 VITALS — BP 129/68 | Ht 67.0 in | Wt 180.0 lb

## 2013-09-11 DIAGNOSIS — I2510 Atherosclerotic heart disease of native coronary artery without angina pectoris: Secondary | ICD-10-CM

## 2013-09-11 DIAGNOSIS — I10 Essential (primary) hypertension: Secondary | ICD-10-CM

## 2013-09-11 DIAGNOSIS — E785 Hyperlipidemia, unspecified: Secondary | ICD-10-CM

## 2013-09-11 MED ORDER — CLOPIDOGREL BISULFATE 75 MG PO TABS: 75.0000 mg | ORAL_TABLET | Freq: Every day | ORAL | Status: DC

## 2013-09-11 NOTE — Patient Instructions (Signed)
Your physician discussed the importance of regular exercise and recommended that you start or continue a regular exercise program for good health.  Your physician recommends that you schedule a follow-up appointment in: 1 year with Dr.Smith

## 2013-09-11 NOTE — Progress Notes (Signed)
Patient ID: Lindsey Erickson, female   DOB: 10/02/1932, 79 y.o.   MRN: [id number]    [address] [place], [place]  [address] Phone: [phone] Fax:  [phone]  Date:  09/11/2013   ID:  Lindsey Erickson, DOB [birth year], MRN [id number]  PCP:  Rene Paci, MD   ASSESSMENT:  1. Prior myocardial infarction, non-ST elevation  2. Moderate coronary artery disease documented by catheterization 2013  3. Hypertension under control  4. Hyperlipidemia on therapy  PLAN:  1. Continue current medical regimen. Increase physical activity.  2. Clinical followup in one year.   SUBJECTIVE: Lindsey Erickson is a 79 y.o. female who has had dramatic improvement in clinical symptoms on medical therapy. She has 3 vessel moderate coronary disease documented by catheterization in 2013. Since last office visit she has not had any episodes of angina. She feels that her ability to participate in activities has dramatically increased.   Wt Readings from Last 3 Encounters:  09/11/13 180 lb (81.647 kg)  08/16/13 180 lb 12.8 oz (82.01 kg)  07/09/13 182 lb 6.4 oz (82.736 kg)     Past Medical History  Diagnosis Date  . Hypothyroidism   . Cholecystitis 02/2010  . Kidney stones   . Osteoporosis     both knees  . Hyperlipidemia   . Arthritis   . HYPERTENSION   . CAD (coronary artery disease)     mod 3v dz cath 05/21/12 - med mgmt  . CAD (coronary artery disease)     Non -STEMI 2004, thrombus in Cfx and 50 % RCA. Nonischemia Cardiolite 2008  . Recurrent urinary tract infection   . H/O Hashimoto thyroiditis     Current Outpatient Prescriptions  Medication Sig Dispense Refill  . acetaminophen (TYLENOL) 500 MG tablet Take 1 tablet (500 mg total) by mouth 2 (two) times daily. And every 6hours as needed (max 8 tab in 24hours)  30 tablet  0  . Ascorbic Acid (VITAMIN C) 500 MG tablet Take 500 mg by mouth daily.        Marland Kitchen aspirin 81 MG tablet Take 81 mg by mouth daily.        .  Calcium Carbonate-Vitamin D (CALCIUM-VITAMIN D) 500-200 MG-UNIT per tablet Take 1 tablet by mouth 2 (two) times daily with a meal.        . carvedilol (COREG CR) 40 MG 24 hr capsule TAKE 1 CAPSULE EVERY DAY  90 capsule  3  . clopidogrel (PLAVIX) 75 MG tablet Take 75 mg by mouth daily.      . CRESTOR 20 MG tablet TAKE 1 TABLET DAILY  90 tablet  3  . hydrochlorothiazide (HYDRODIURIL) 12.5 MG tablet Take 12.5 mg by mouth 3 (three) times a week. Monday Wednesday Friday      . isosorbide mononitrate (IMDUR) 60 MG 24 hr tablet Take 1 tablet (60 mg total) by mouth daily.  90 tablet  3  . levothyroxine (SYNTHROID, LEVOTHROID) 88 MCG tablet Take 1 tablet (88 mcg total) by mouth daily.  90 tablet  3  . lisinopril (PRINIVIL,ZESTRIL) 40 MG tablet Take 1 tablet (40 mg total) by mouth daily.  90 tablet  3  . Multiple Vitamin (MULTIVITAMIN WITH MINERALS) TABS Take 1 tablet by mouth daily.      . Niacin (VITAMIN B-3 PO) Take by mouth every morning.      . nitroGLYCERIN (NITROSTAT) 0.4 MG SL tablet Place 1 tablet (0.4 mg total) under the tongue every 5 (  five) minutes as needed. For chest pain  90 tablet  0  . risedronate (ACTONEL) 35 MG tablet Take 1 tablet (35 mg total) by mouth every 7 (seven) days. with water on empty stomach, nothing by mouth or lie down for next 30 minutes.  12 tablet  3  . zolpidem (AMBIEN) 5 MG tablet Take 5 mg by mouth at bedtime as needed for sleep.       No current facility-administered medications for this visit.    Allergies:    Allergies  Allergen Reactions  . Other     Frozen Blood Plasma/ shaking, increase temp  . Codeine Nausea And Vomiting    Pt states  . Monte-G Hc     All narcotics    Social History:  The patient  reports that she has never smoked. She does not have any smokeless tobacco history on file. She reports that she does not drink alcohol or use illicit drugs.   ROS:  Please see the history of present illness.   Denies hemoptysis, transient neurological  symptoms, claudication, and peripheral edema   All other systems reviewed and negative.   OBJECTIVE: VS:  BP 129/68  Ht 5\' 7"  (1.702 m)  Wt 180 lb (81.647 kg)  BMI 28.19 kg/m2 Well nourished, well developed, in no acute distress, with no evidence of obesity HEENT: normal Neck: JVD flat. Carotid bruit none  Cardiac:  normal S1, S2; RRR; no murmur Lungs:  clear to auscultation bilaterally, no wheezing, rhonchi or rales Abd: soft, nontender, no hepatomegaly Ext: Edema none. Pulses 2+ and symmetric Skin: warm and dry Neuro:  CNs 2-12 intact, no focal abnormalities noted  EKG:  Normal sinus rhythm       Signed, Darci Needle III, MD 09/11/2013 10:06 AM

## 2013-09-27 ENCOUNTER — Other Ambulatory Visit: Payer: Self-pay

## 2013-09-28 ENCOUNTER — Telehealth: Payer: Self-pay | Admitting: Endocrinology

## 2013-09-28 NOTE — Telephone Encounter (Signed)
Pt has follow up appt 10/03/13 w/ Dr. Lucianne Muss, pt has called and cancelled her labs scheduled for 10/01/13. She currently does not have a lab appt on the schedule / Sherri

## 2013-10-01 ENCOUNTER — Other Ambulatory Visit: Payer: Medicare Other

## 2013-10-03 ENCOUNTER — Ambulatory Visit (INDEPENDENT_AMBULATORY_CARE_PROVIDER_SITE_OTHER): Payer: Medicare Other | Admitting: Endocrinology

## 2013-10-03 ENCOUNTER — Encounter: Payer: Self-pay | Admitting: Endocrinology

## 2013-10-03 VITALS — BP 118/76 | HR 72 | Temp 98.4°F | Resp 12 | Ht 66.0 in | Wt 182.6 lb

## 2013-10-03 DIAGNOSIS — R7301 Impaired fasting glucose: Secondary | ICD-10-CM

## 2013-10-03 DIAGNOSIS — I10 Essential (primary) hypertension: Secondary | ICD-10-CM

## 2013-10-03 DIAGNOSIS — E785 Hyperlipidemia, unspecified: Secondary | ICD-10-CM

## 2013-10-03 DIAGNOSIS — E042 Nontoxic multinodular goiter: Secondary | ICD-10-CM

## 2013-10-03 LAB — HEMOGLOBIN A1C: Hgb A1c MFr Bld: 6.1 % (ref 4.6–6.5)

## 2013-10-03 LAB — T4, FREE: Free T4: 1.11 ng/dL (ref 0.60–1.60)

## 2013-10-03 LAB — TSH: TSH: 1.45 u[IU]/mL (ref 0.35–5.50)

## 2013-10-03 NOTE — Patient Instructions (Signed)
Watch fats and Carbs in diet

## 2013-10-03 NOTE — Progress Notes (Signed)
Patient ID: Lindsey Erickson, female   DOB: 1932-03-02, 79 y.o.   MRN: [id number]  Reason for Appointment:  Thyroid problems, followup visit    History of Present Illness:   She initially had a multinodular goiter in 1982 She has had 3 biopsies of her thyroid nodules subsequently with the last one in 2003 which were benign Her biopsies had shown a few follicular cells and lymphocytes, not clear which nodule was biopsied She was last seen in 5/14 in her neck circumference at that time was 42.5 cm Her thyroid status has been unchanged for some time  ? Hypothyroidism: Most likely she has been on thyroid suppression for the goiter rather than true hypothyroidism although previous records do indicate Hashimoto thyroiditis  Complaints are reported by the patient now are none and she has no unusual fatigue or hair loss        The treatments that the patient has taken include Synthroid88 mcg for several years .          Compliance with the medical regimen has been as prescribed with taking the tablet in the morning before breakfast.  PREDIABETES:   Her last fasting glucose was 108. She had an A1c of 5.7 about 6 months ago Has not had a glucose tolerance test She thinks she is watching her diet with regard to fats and sweets but is unable to lose weight Has gained about 2 pounds in the last 6 months Is not able to exercise because of knee pain and is going to get ? A steroid injection today      No visits with results within 1 Week(s) from this visit. Latest known visit with results is:  Appointment on 07/09/2013  Component Date Value Range Status  . TSH 07/09/2013 0.62  0.35 - 5.50 uIU/mL Final  . Cholesterol 07/09/2013 162  0 - 200 mg/dL Final   ATP III Classification       Desirable:  < 200 mg/dL               Borderline High:  200 - 239 mg/dL          High:  > = 962 mg/dL  . Triglycerides 07/09/2013 202.0* 0.0 - 149.0 mg/dL Final   Normal:  <952 mg/dLBorderline High:  150 - 199 mg/dL  .  HDL 07/09/2013 53.80  >39.00 mg/dL Final  . VLDL 84/13/2440 40.4* 0.0 - 40.0 mg/dL Final  . Total CHOL/HDL Ratio 07/09/2013 3   Final                  Men          Women1/2 Average Risk     3.4          3.3Average Risk          5.0          4.42X Average Risk          9.6          7.13X Average Risk          15.0          11.0                      . Total Bilirubin 07/09/2013 0.8  0.3 - 1.2 mg/dL Final  . Bilirubin, Direct 07/09/2013 0.1  0.0 - 0.3 mg/dL Final  . Alkaline Phosphatase 07/09/2013 47  39 - 117 U/L Final  . AST 07/09/2013 21  0 -  37 U/L Final  . ALT 07/09/2013 18  0 - 35 U/L Final  . Total Protein 07/09/2013 7.1  6.0 - 8.3 g/dL Final  . Albumin 16/08/9603 3.9  3.5 - 5.2 g/dL Final  . Sodium 54/07/8118 138  135 - 145 mEq/L Final  . Potassium 07/09/2013 5.1  3.5 - 5.1 mEq/L Final  . Chloride 07/09/2013 105  96 - 112 mEq/L Final  . CO2 07/09/2013 28  19 - 32 mEq/L Final  . Glucose, Bld 07/09/2013 108* 70 - 99 mg/dL Final  . BUN 14/78/2956 19  6 - 23 mg/dL Final  . Creatinine, Ser 07/09/2013 0.7  0.4 - 1.2 mg/dL Final  . Calcium 21/30/8657 9.5  8.4 - 10.5 mg/dL Final  . GFR 84/69/6295 87.25  >60.00 mL/min Final  . Direct LDL 07/09/2013 84.1   Final   Optimal:  <100 mg/dLNear or Above Optimal:  100-129 mg/dLBorderline High:  130-159 mg/dLHigh:  160-189 mg/dLVery High:  >190 mg/dL      Medication List       This list is accurate as of: 10/03/13  9:04 AM.  Always use your most recent med list.               acetaminophen 500 MG tablet  Commonly known as:  TYLENOL  Take 1 tablet (500 mg total) by mouth 2 (two) times daily. And every 6hours as needed (max 8 tab in 24hours)     aspirin 81 MG tablet  Take 81 mg by mouth daily.     calcium-vitamin D 500-200 MG-UNIT per tablet  Take 1 tablet by mouth 2 (two) times daily with a meal.     carvedilol 40 MG 24 hr capsule  Commonly known as:  COREG CR  TAKE 1 CAPSULE EVERY DAY     clopidogrel 75 MG tablet  Commonly known  as:  PLAVIX  Take 1 tablet (75 mg total) by mouth daily.     CRESTOR 20 MG tablet  Generic drug:  rosuvastatin  TAKE 1 TABLET DAILY     hydrochlorothiazide 12.5 MG tablet  Commonly known as:  HYDRODIURIL  Take 12.5 mg by mouth 3 (three) times a week. Monday Wednesday Friday     isosorbide mononitrate 60 MG 24 hr tablet  Commonly known as:  IMDUR  Take 1 tablet (60 mg total) by mouth daily.     levothyroxine 88 MCG tablet  Commonly known as:  SYNTHROID, LEVOTHROID  Take 1 tablet (88 mcg total) by mouth daily.     lisinopril 40 MG tablet  Commonly known as:  PRINIVIL,ZESTRIL  Take 1 tablet (40 mg total) by mouth daily.     Methylsulfonylmethane 1000 MG Caps  Take 1,000 mg by mouth.     multivitamin with minerals Tabs tablet  Take 1 tablet by mouth daily.     nitroGLYCERIN 0.4 MG SL tablet  Commonly known as:  NITROSTAT  Place 1 tablet (0.4 mg total) under the tongue every 5 (five) minutes as needed. For chest pain     risedronate 35 MG tablet  Commonly known as:  ACTONEL  Take 1 tablet (35 mg total) by mouth every 7 (seven) days. with water on empty stomach, nothing by mouth or lie down for next 30 minutes.     VITAMIN B-3 PO  Take by mouth every morning.     vitamin C 500 MG tablet  Commonly known as:  ASCORBIC ACID  Take 500 mg by mouth daily.     zolpidem  5 MG tablet  Commonly known as:  AMBIEN  Take 5 mg by mouth at bedtime as needed for sleep.        Allergies:  Allergies  Allergen Reactions  . Other     Frozen Blood Plasma/ shaking, increase temp  . Codeine Nausea And Vomiting    Pt states  . [facility] Hc     All narcotics    Past Medical History  Diagnosis Date  . Hypothyroidism   . Cholecystitis 02/2010  . Kidney stones   . Osteoporosis     both knees  . Hyperlipidemia   . Arthritis   . HYPERTENSION   . CAD (coronary artery disease)     mod 3v dz cath 05/21/12 - med mgmt  . CAD (coronary artery disease)     Non -STEMI 2004, thrombus in Cfx  and 50 % RCA. Nonischemia Cardiolite 2008  . Recurrent urinary tract infection   . H/O Hashimoto thyroiditis     Past Surgical History  Procedure Laterality Date  . Appendectomy  1999  . Knee repalcement  2011  . Cholecystectomy  02/2010  . Bilateral arthroscopies of knees    . Joint replacement  01/2010    L TKR - allusio  . Cardiac catheterization      Moderate 3 vessel CAD by cath 05/2012    Family History  Problem Relation Age of Onset  . Allergies Father   . Cancer Father   . Coronary artery disease Mother     Social History:  reports that she has never smoked. She does not have any smokeless tobacco history on file. She reports that she does not drink alcohol or use illicit drugs.  REVIEW Of SYSTEMS:   She apparently has osteoporosis, treated by PCP with Actonel  Currently symptomatic from her CAD, followed regularly by cardiologist  Hypertension: Is well-controlled on a 2 drug regimen   Examination:   BP 118/76  Pulse 72  Temp(Src) 98.4 F (36.9 C)  Resp 12  Ht 5\' 6"  (1.676 m)  Wt 182 lb 9.6 oz (82.827 kg)  BMI 29.49 kg/m2  SpO2 96%   GENERAL APPEARANCE: Alert And looks well.    FACE: No puffiness of face, puffiness of hands or feet     NECK:  circumference is 42 cm     She has a large multinodular goiter with a 3-4 times enlarged right lobe, about 3 times enlarged left lobe and midline. Her left lobe shows a firmer nodule and right lobe is smooth No local lymphadenopathy     NEUROLOGIC EXAM: DTRs 2+ bilaterally at biceps with normal relaxation. No pedal edema    Assessments   Multinodular goiter, clinically stable with no local pressure symptoms ? Coexisting hypothyroidism and Hashimoto thyroiditis. Her TSH level has been relatively low and this year and probably needs to reduce her dosage especially with her history of CAD Will check her TSH again today and decide on dosage  IFG/prediabetes: Her fasting glucose was 108. She is unable to exercise  much but is generally watching her diet, discussed need to moderate amounts of carbohydrates and fats in the diet Will check her A1c again since it has not been checked for 6 months and consider metformin if high  Hyperlipidemia: Followed by cardiologist/PCP, LDL target should be 70  Osteoporosis: Followed by PCP  [facility] 10/03/2013, 9:04 AM

## 2013-10-03 NOTE — Progress Notes (Signed)
Quick Note:  Please let patient know that the thyroid result is normal, continue same dose Blood sugar is slightly higher but still prediabetic, needs more efforts to lose weight ______

## 2013-10-04 ENCOUNTER — Telehealth: Payer: Self-pay

## 2013-10-04 NOTE — Telephone Encounter (Signed)
Results given to patient

## 2013-10-04 NOTE — Telephone Encounter (Signed)
The patient called and is hoping to get a call back regarding her lab results.  

## 2013-10-25 ENCOUNTER — Other Ambulatory Visit: Payer: Self-pay | Admitting: Internal Medicine

## 2013-10-25 DIAGNOSIS — Z1231 Encounter for screening mammogram for malignant neoplasm of breast: Secondary | ICD-10-CM

## 2013-11-26 ENCOUNTER — Ambulatory Visit (HOSPITAL_COMMUNITY)
Admission: RE | Admit: 2013-11-26 | Discharge: 2013-11-26 | Disposition: A | Discharge status: Home / Self Care | Payer: Medicare Other | Source: Ambulatory Visit | Attending: Internal Medicine | Admitting: Internal Medicine

## 2013-11-26 DIAGNOSIS — Z1231 Encounter for screening mammogram for malignant neoplasm of breast: Secondary | ICD-10-CM

## 2013-11-28 ENCOUNTER — Other Ambulatory Visit: Payer: Self-pay | Admitting: Dermatology

## 2013-12-19 ENCOUNTER — Encounter: Payer: Self-pay | Admitting: Internal Medicine

## 2013-12-19 ENCOUNTER — Ambulatory Visit (INDEPENDENT_AMBULATORY_CARE_PROVIDER_SITE_OTHER): Payer: Medicare Other | Admitting: Internal Medicine

## 2013-12-19 VITALS — BP 118/82 | HR 78 | Temp 98.9°F | Wt 179.2 lb

## 2013-12-19 DIAGNOSIS — R109 Unspecified abdominal pain: Secondary | ICD-10-CM

## 2013-12-19 DIAGNOSIS — N390 Urinary tract infection, site not specified: Secondary | ICD-10-CM

## 2013-12-19 DIAGNOSIS — R300 Dysuria: Secondary | ICD-10-CM

## 2013-12-19 DIAGNOSIS — N760 Acute vaginitis: Secondary | ICD-10-CM

## 2013-12-19 LAB — POCT URINALYSIS DIPSTICK
Bilirubin, UA: NEGATIVE
Blood, UA: NEGATIVE
Glucose, UA: NEGATIVE
KETONES UA: NEGATIVE
NITRITE UA: NEGATIVE
Protein, UA: NEGATIVE
Spec Grav, UA: <=1.005
Urobilinogen, UA: 0.2
pH, UA: 5

## 2013-12-19 MED ORDER — FLUCONAZOLE 150 MG PO TABS: 150.0000 mg | ORAL_TABLET | Freq: Once | ORAL | Status: DC

## 2013-12-19 MED ORDER — CIPROFLOXACIN HCL 250 MG PO TABS: 250.0000 mg | ORAL_TABLET | Freq: Two times a day (BID) | ORAL | Status: DC

## 2013-12-19 NOTE — Progress Notes (Signed)
Pre-visit discussion using our clinic review tool. No additional management support is needed unless otherwise documented below in the visit note.  

## 2013-12-19 NOTE — Progress Notes (Signed)
HPI: complains of ?UTI or kidney stone symptoms Onset 2 days ago, progressively worse associated with dysuria, suprapubic pressure, R flank pain and small volume voiding with increased frequency; also vaginal irritation but no itching or discharge denies gross hematuria or fever The patient has a history of prior UTI with similar symptoms  PMH: reviewed  ROS:  Gen.: No unexpected weight change, no night sweats Lungs: No cough or shortness of breath Cardiovascular: No palpitations or chest pain  PE: BP 118/82  Pulse 78  Temp(Src) 98.9 F (37.2 C) (Oral)  Wt 179 lb 3.2 oz (81.285 kg)  SpO2 95% General: No acute distress Lungs: Clear to auscultation Cardiovascular: Regular rate rhythm, no edema Abdomen: Mild to moderate discomfort of her suprapubic region, min R flank tenderness to palpation GU: defer  Lab Results  Component Value Date   WBC 6.1 05/21/2012   HGB 13.0 05/21/2012   HCT 37.4 05/21/2012   PLT 232 05/21/2012   GLUCOSE 111* 10/03/2013   CHOL 162 07/09/2013   TRIG 202.0* 07/09/2013   HDL 53.80 07/09/2013   LDLDIRECT 84.1 07/09/2013   LDLCALC 85 03/27/2013   ALT 18 07/09/2013   AST 21 07/09/2013   NA 138 07/09/2013   K 5.1 07/09/2013   CL 105 07/09/2013   CREATININE 0.7 07/09/2013   BUN 19 07/09/2013   CO2 28 07/09/2013   TSH 1.45 10/03/2013   INR 1.27 05/22/2012   HGBA1C 6.1 10/03/2013   Udip POC - +LE  Assessment/Plan: UTI, classic symptoms with history of same Empiric antibiotic x7 days Hydration recommended and education provided rx oral antifungal also recomeneded  If recurrent R flank pain, pt will call for refer to CT to look for kidney stone, but as pain symptoms have much improved in past 12h and no blood in urine, will not pursue CT at this time

## 2013-12-19 NOTE — Patient Instructions (Signed)
It was good to see you today.  Cipro antibiotics 2 times a day for one week to treat bladder infection - Also diflucan - take once for yeast symptoms   Your prescription(s) have been submitted to your pharmacy. Please take as directed and contact our office if you believe you are having problem(s) with the medication(s).  Also use over-the-counter Monistat cream externally as needed No other medication changes recommended  Keep hydrated, use cranberry juice or cranberry products to help acidify urine as discussed  If recurrent or worsening right flank pain, call for CT test to look for kidney stones  Also call if continued problems or unimproved in next one week

## 2013-12-26 ENCOUNTER — Other Ambulatory Visit: Payer: Self-pay | Admitting: Interventional Cardiology

## 2014-01-14 ENCOUNTER — Ambulatory Visit (INDEPENDENT_AMBULATORY_CARE_PROVIDER_SITE_OTHER): Payer: Medicare Other | Admitting: Internal Medicine

## 2014-01-14 ENCOUNTER — Encounter: Payer: Self-pay | Admitting: Internal Medicine

## 2014-01-14 VITALS — BP 120/82 | HR 67 | Temp 97.5°F | Wt 182.0 lb

## 2014-01-14 DIAGNOSIS — M1710 Unilateral primary osteoarthritis, unspecified knee: Secondary | ICD-10-CM

## 2014-01-14 DIAGNOSIS — M1711 Unilateral primary osteoarthritis, right knee: Secondary | ICD-10-CM

## 2014-01-14 DIAGNOSIS — E039 Hypothyroidism, unspecified: Secondary | ICD-10-CM

## 2014-01-14 DIAGNOSIS — M8440XA Pathological fracture, unspecified site, initial encounter for fracture: Secondary | ICD-10-CM

## 2014-01-14 DIAGNOSIS — IMO0002 Reserved for concepts with insufficient information to code with codable children: Secondary | ICD-10-CM

## 2014-01-14 DIAGNOSIS — Z23 Encounter for immunization: Secondary | ICD-10-CM

## 2014-01-14 DIAGNOSIS — M8080XA Other osteoporosis with current pathological fracture, unspecified site, initial encounter for fracture: Secondary | ICD-10-CM

## 2014-01-14 DIAGNOSIS — I2510 Atherosclerotic heart disease of native coronary artery without angina pectoris: Secondary | ICD-10-CM

## 2014-01-14 DIAGNOSIS — M810 Age-related osteoporosis without current pathological fracture: Secondary | ICD-10-CM

## 2014-01-14 MED ORDER — CLOPIDOGREL BISULFATE 75 MG PO TABS: 75.0000 mg | ORAL_TABLET | Freq: Every day | ORAL | Status: DC

## 2014-01-14 MED ORDER — CARVEDILOL PHOSPHATE ER 40 MG PO CP24: ORAL_CAPSULE | ORAL | Status: DC

## 2014-01-14 MED ORDER — NITROGLYCERIN 0.4 MG SL SUBL: 0.4000 mg | SUBLINGUAL_TABLET | SUBLINGUAL | Status: DC | PRN

## 2014-01-14 MED ORDER — RISEDRONATE SODIUM 35 MG PO TABS
35.0000 mg | ORAL_TABLET | ORAL | Status: DC
Start: 2014-01-14 — End: 2014-02-19

## 2014-01-14 MED ORDER — LISINOPRIL 40 MG PO TABS: 40.0000 mg | ORAL_TABLET | Freq: Every day | ORAL | Status: DC

## 2014-01-14 MED ORDER — ISOSORBIDE MONONITRATE ER 60 MG PO TB24: 60.0000 mg | ORAL_TABLET | Freq: Every day | ORAL | Status: DC

## 2014-01-14 NOTE — Assessment & Plan Note (Signed)
Continue bisphos and Calcium + D fx hx accidental fall in 12/2000 (elbow R) and 05/2008 (foot L) DEXA 11/2011: -1.3 R fem, unchanged Check DEXA now to monitor tx (actonel for years)

## 2014-01-14 NOTE — Patient Instructions (Addendum)
It was good to see you today.  We have reviewed your interval records including labs and tests today  Prevnar immunization updated today  Medications reviewed, no changes at this time. Refill on medication(s) as discussed today.  we'll make referral for bone density. Our office will contact you regarding appointment(s) once made. Your results will be released to [place] (or called to you) after review, usually within 72hours after test completion. If any changes need to be made, you will be notified at that same time.  Continue working with your other specialists as discused today.  Please schedule followup in 6 months, call sooner if problems.    Osteoporosis Osteoporosis happens when your bones become weak because of bone loss. Weak bones can break (fracture) more easily with slips or falls. You are more likely to develop osteoporosis if:  You are a woman.  You are older than 50 years.  You are white or Asian.  You are very thin.  Someone in your family has had osteoporosis.  You smoke or use nicotine. CAUSES   Smoking.  Too much drinking.  Being a weight below normal.  Not being active.  Not going outside in the sun enough.  Certain medical conditions, such as diabetes or Crohn disease.  Certain medicines, such as steroids or antiseizure medicines. TREATMENT  The goal of treatment is to strengthen bones. There are different types of medicines that help your bones. Some medicines make your bones more solid. Some medicines help to slow down how much bone you lose. Your doctor may check to see if you are getting enough calcium and vitamin D in your diet. PREVENTION   Make sure you get enough calcium and vitamin D.  Make sure you exercise often.  If you smoke, quit. MAKE SURE YOU:  Understand these instructions.  Will watch your condition.  Will get help right away if you are not doing well or get worse. Document Released: 01/31/2012 Document Reviewed:  01/31/2012 [facility] Patient Information 2014 [place].

## 2014-01-14 NOTE — Assessment & Plan Note (Signed)
Reviewed stress test 01/2012 and hosp 04/2012 for angina/cath Follows with [facility] - [name] - med mgmt ongoing No angina since DC 05/24/12 The current medical regimen is effective;  continue present plan and medications.

## 2014-01-14 NOTE — Assessment & Plan Note (Signed)
Hashimotos with goiter - will continue to follow with endo for same (kumar) The current medical regimen is effective;  continue present plan and medications.   Lab Results  Component Value Date   TSH 1.45 10/03/2013   

## 2014-01-14 NOTE — Progress Notes (Signed)
Subjective:    Patient ID: Lindsey Erickson, female    DOB: 1932/03/28, 80 y.o.   MRN: [id number]  HPI here for follow up - reviewed interval history and chronic medical issues today:  CAD - angina without MI 04/2012 - s/p hospitalization and cath 05/21/12>mod 3v CAD, not amenable to stent so med mgmt selected - the patient reports compliance with medication(s) as prescribed. Denies adverse side effects.   Hypothyroid  -follows with endo for same. hx hashimotos and goiter - s/p bx x 3: benign - last in 07/2002- the patient reports compliance with medication(s) as prescribed. Denies adverse side effects. No weight or skin changes.  Dyslipidemia - on statin for same since MI 2004 - the patient reports compliance with medication(s) as prescribed. Denies adverse side effects.  hypertention - the patient reports compliance with medication(s) as prescribed. Denies adverse side effects.  Osteoporosis - hx fx following accidental fall x2 - takes actonel with vit d + calcium as prescribed. Denies adverse side effects from same.  R knee DJD - indefinitely delaying R TKR with ortho due to CAD (cath 04/2012) until cleared by cards - improved with synvisc injections for pain control, no swelling, no nocturnal pain  Past Medical History  Diagnosis Date  . Hypothyroidism   . Cholecystitis 02/2010  . Kidney stones   . Osteoporosis     both knees  . Hyperlipidemia   . Arthritis   . HYPERTENSION   . CAD (coronary artery disease)     mod 3v dz cath 05/21/12 - med mgmt  . CAD (coronary artery disease)     Non -STEMI 2004, thrombus in Cfx and 50 % RCA. Nonischemia Cardiolite 2008  . Recurrent urinary tract infection   . H/O Hashimoto thyroiditis    Review of Systems  Constitutional: Negative for fatigue and unexpected weight change.  Respiratory: Negative for cough, shortness of breath and wheezing.   Cardiovascular: Negative for chest pain, palpitations and leg swelling.  Gastrointestinal: Negative for  nausea, abdominal pain and diarrhea.  Musculoskeletal: Positive for arthralgias (chronic R knee).  Neurological: Negative for dizziness, weakness, light-headedness and headaches.  Psychiatric/Behavioral: Negative for dysphoric mood. The patient is not nervous/anxious.        Objective:   Physical Exam BP 120/82  Pulse 67  Temp(Src) 97.5 F (36.4 C) (Oral)  Wt 182 lb (82.555 kg)  SpO2 98% Wt Readings from Last 3 Encounters:  01/14/14 182 lb (82.555 kg)  12/19/13 179 lb 3.2 oz (81.285 kg)  10/03/13 182 lb 9.6 oz (82.827 kg)   Constitutional: She appears well-developed and well-nourished. No distress.  Neck: Normal range of motion. Neck supple. No JVD present. Mild goiter, nontender.  Cardiovascular: Normal rate, regular rhythm and normal heart sounds.  No murmur heard. Pulmonary/Chest: Effort normal and breath sounds normal. No respiratory distress. She has no wheezes. Psychiatric: She has a normal mood and affect. Her behavior is normal. Judgment and thought content normal.      Lab Results  Component Value Date   WBC 6.1 05/21/2012   HGB 13.0 05/21/2012   HCT 37.4 05/21/2012   PLT 232 05/21/2012   CHOL 162 07/09/2013   TRIG 202.0* 07/09/2013   HDL 53.80 07/09/2013   LDLDIRECT 84.1 07/09/2013   ALT 18 07/09/2013   AST 21 07/09/2013   NA 138 07/09/2013   K 5.1 07/09/2013   CL 105 07/09/2013   CREATININE 0.7 07/09/2013   BUN 19 07/09/2013   CO2 28 07/09/2013   TSH  1.45 10/03/2013   INR 1.27 05/22/2012   HGBA1C 6.1 10/03/2013     Assessment & Plan:   Problem List Items Addressed This Visit   CAD (coronary artery disease) - Primary     Reviewed stress test 01/2012 and hosp 04/2012 for angina/cath Follows with [facility] - [name] - med mgmt ongoing No angina since DC 05/24/12 The current medical regimen is effective;  continue present plan and medications.     Relevant Medications      carvedilol (COREG CR) 24 hr capsule      clopidogrel (PLAVIX) tablet      isosorbide mononitrate  (IMDUR) 24 hr tablet      lisinopril (PRINIVIL,ZESTRIL) tablet      nitroGLYCERIN (NITROSTAT) SL tablet   Hypothyroid (Chronic)      Hashimotos with goiter - will continue to follow with endo for same Dwyane Dee) The current medical regimen is effective;  continue present plan and medications.   Lab Results  Component Value Date   TSH 1.45 10/03/2013      Osteoarthritis of right knee     Previously planned R TKA 07/2012 cancelled due to CAD/anginal 04/2012 Continue to follow with ortho as ongoing, synvisc/steroid injections as needed Also advised scheduled Tylenol twice a day and as needed -reassured regarding safety of same    Osteoporosis with fracture hx (Chronic)     Continue bisphos and Calcium + D fx hx accidental fall in 12/2000 (elbow R) and 05/2008 (foot L) DEXA 11/2011: -1.3 R fem, unchanged Check DEXA now to monitor tx (actonel for years)    Relevant Orders      DG Bone Density

## 2014-01-14 NOTE — Assessment & Plan Note (Signed)
Previously planned R TKA 07/2012 cancelled due to CAD/anginal 04/2012 Continue to follow with ortho as ongoing, synvisc/steroid injections as needed Also advised scheduled Tylenol twice a day and as needed -reassured regarding safety of same

## 2014-01-14 NOTE — Progress Notes (Signed)
Pre-visit discussion using our clinic review tool. No additional management support is needed unless otherwise documented below in the visit note.  

## 2014-02-19 ENCOUNTER — Encounter (HOSPITAL_COMMUNITY): Payer: Self-pay | Admitting: Emergency Medicine

## 2014-02-19 ENCOUNTER — Observation Stay (HOSPITAL_COMMUNITY)
Admission: EM | Admit: 2014-02-19 | Discharge: 2014-02-21 | Disposition: A | Discharge status: Home / Self Care | Payer: Medicare Other | Attending: Interventional Cardiology | Admitting: Interventional Cardiology

## 2014-02-19 ENCOUNTER — Telehealth: Payer: Self-pay | Admitting: Internal Medicine

## 2014-02-19 DIAGNOSIS — Z96659 Presence of unspecified artificial knee joint: Secondary | ICD-10-CM | POA: Insufficient documentation

## 2014-02-19 DIAGNOSIS — R9431 Abnormal electrocardiogram [ECG] [EKG]: Secondary | ICD-10-CM | POA: Diagnosis present

## 2014-02-19 DIAGNOSIS — M810 Age-related osteoporosis without current pathological fracture: Secondary | ICD-10-CM | POA: Insufficient documentation

## 2014-02-19 DIAGNOSIS — R079 Chest pain, unspecified: Secondary | ICD-10-CM | POA: Diagnosis present

## 2014-02-19 DIAGNOSIS — I2510 Atherosclerotic heart disease of native coronary artery without angina pectoris: Secondary | ICD-10-CM | POA: Diagnosis present

## 2014-02-19 DIAGNOSIS — I219 Acute myocardial infarction, unspecified: Secondary | ICD-10-CM

## 2014-02-19 DIAGNOSIS — I10 Essential (primary) hypertension: Secondary | ICD-10-CM | POA: Diagnosis present

## 2014-02-19 DIAGNOSIS — M542 Cervicalgia: Secondary | ICD-10-CM

## 2014-02-19 DIAGNOSIS — R072 Precordial pain: Secondary | ICD-10-CM | POA: Diagnosis present

## 2014-02-19 DIAGNOSIS — E785 Hyperlipidemia, unspecified: Secondary | ICD-10-CM | POA: Diagnosis present

## 2014-02-19 DIAGNOSIS — Z7982 Long term (current) use of aspirin: Secondary | ICD-10-CM | POA: Insufficient documentation

## 2014-02-19 DIAGNOSIS — M129 Arthropathy, unspecified: Secondary | ICD-10-CM | POA: Insufficient documentation

## 2014-02-19 DIAGNOSIS — Z7902 Long term (current) use of antithrombotics/antiplatelets: Secondary | ICD-10-CM | POA: Insufficient documentation

## 2014-02-19 DIAGNOSIS — E039 Hypothyroidism, unspecified: Secondary | ICD-10-CM | POA: Insufficient documentation

## 2014-02-19 DIAGNOSIS — I214 Non-ST elevation (NSTEMI) myocardial infarction: Principal | ICD-10-CM | POA: Diagnosis present

## 2014-02-19 HISTORY — DX: Abnormal electrocardiogram (ECG) (EKG): R94.31

## 2014-02-19 LAB — CBC
HCT: 41.4 % (ref 36.0–46.0)
Hemoglobin: 14.5 g/dL (ref 12.0–15.0)
MCH: 32.5 pg (ref 26.0–34.0)
MCHC: 35 g/dL (ref 30.0–36.0)
MCV: 92.8 fL (ref 78.0–100.0)
PLATELETS: 230 10*3/uL (ref 150–400)
RBC: 4.46 MIL/uL (ref 3.87–5.11)
RDW: 12.6 % (ref 11.5–15.5)
WBC: 6.5 10*3/uL (ref 4.0–10.5)

## 2014-02-19 LAB — COMPREHENSIVE METABOLIC PANEL
ALBUMIN: 4.1 g/dL (ref 3.5–5.2)
ALK PHOS: 67 U/L (ref 39–117)
ALT: 17 U/L (ref 0–35)
AST: 22 U/L (ref 0–37)
BILIRUBIN TOTAL: 0.6 mg/dL (ref 0.3–1.2)
BUN: 20 mg/dL (ref 6–23)
CHLORIDE: 101 meq/L (ref 96–112)
CO2: 25 mEq/L (ref 19–32)
Calcium: 10.1 mg/dL (ref 8.4–10.5)
Creatinine, Ser: 0.65 mg/dL (ref 0.50–1.10)
GFR calc Af Amer: >90 mL/min (ref >90)
GFR calc non Af Amer: 82 mL/min — ABNORMAL LOW (ref >90)
Glucose, Bld: 112 mg/dL — ABNORMAL HIGH (ref 70–99)
Potassium: 4.3 mEq/L (ref 3.7–5.3)
Sodium: 140 mEq/L (ref 137–147)
Total Protein: 7.8 g/dL (ref 6.0–8.3)

## 2014-02-19 LAB — URINE MICROSCOPIC-ADD ON

## 2014-02-19 LAB — URINALYSIS, ROUTINE W REFLEX MICROSCOPIC
Bilirubin Urine: NEGATIVE
GLUCOSE, UA: NEGATIVE mg/dL
Hgb urine dipstick: NEGATIVE
KETONES UR: 40 mg/dL — AB
Nitrite: NEGATIVE
PH: 6 (ref 5.0–8.0)
PROTEIN: NEGATIVE mg/dL
Specific Gravity, Urine: 1.017 (ref 1.005–1.030)
Urobilinogen, UA: 1 mg/dL (ref 0.0–1.0)

## 2014-02-19 LAB — TROPONIN I: Troponin I: 0.42 ng/mL (ref <0.30)

## 2014-02-19 LAB — I-STAT TROPONIN, ED: Troponin i, poc: 0.04 ng/mL (ref 0.00–0.08)

## 2014-02-19 MED ORDER — LISINOPRIL 40 MG PO TABS
40.0000 mg | ORAL_TABLET | Freq: Every day | ORAL | Status: DC
  Administered 2014-02-20 – 2014-02-21 (×2): 40 mg via ORAL
  Filled 2014-02-19 (×2): qty 1

## 2014-02-19 MED ORDER — ASPIRIN EC 81 MG PO TBEC
81.0000 mg | DELAYED_RELEASE_TABLET | Freq: Every day | ORAL | Status: DC
  Administered 2014-02-19: 81 mg via ORAL
  Filled 2014-02-19 (×2): qty 1

## 2014-02-19 MED ORDER — HEPARIN BOLUS VIA INFUSION
4000.0000 [IU] | Freq: Once | INTRAVENOUS | Status: AC
  Administered 2014-02-20: 4000 [IU] via INTRAVENOUS
  Filled 2014-02-19: qty 4000

## 2014-02-19 MED ORDER — HYDROCHLOROTHIAZIDE 12.5 MG PO CAPS
12.5000 mg | ORAL_CAPSULE | Freq: Every day | ORAL | Status: DC
  Administered 2014-02-20: 12.5 mg via ORAL
  Filled 2014-02-19: qty 1

## 2014-02-19 MED ORDER — CARVEDILOL PHOSPHATE ER 40 MG PO CP24
40.0000 mg | ORAL_CAPSULE | Freq: Every day | ORAL | Status: DC
  Administered 2014-02-19 – 2014-02-20 (×2): 40 mg via ORAL
  Filled 2014-02-19 (×4): qty 1

## 2014-02-19 MED ORDER — ASPIRIN 81 MG PO TABS: 81.0000 mg | ORAL_TABLET | Freq: Every day | ORAL | Status: DC

## 2014-02-19 MED ORDER — LEVOTHYROXINE SODIUM 88 MCG PO TABS
88.0000 ug | ORAL_TABLET | Freq: Every day | ORAL | Status: DC
  Administered 2014-02-20 – 2014-02-21 (×2): 88 ug via ORAL
  Filled 2014-02-19 (×3): qty 1

## 2014-02-19 MED ORDER — HEPARIN (PORCINE) IN NACL 100-0.45 UNIT/ML-% IJ SOLN
900.0000 [IU]/h | INTRAMUSCULAR | Status: DC
Start: 2014-02-19 — End: 2014-02-20
  Administered 2014-02-20: 900 [IU]/h via INTRAVENOUS
  Filled 2014-02-19 (×2): qty 250

## 2014-02-19 MED ORDER — ISOSORBIDE MONONITRATE ER 60 MG PO TB24
60.0000 mg | ORAL_TABLET | Freq: Every day | ORAL | Status: DC
  Administered 2014-02-20 – 2014-02-21 (×2): 60 mg via ORAL
  Filled 2014-02-19 (×2): qty 1

## 2014-02-19 MED ORDER — SODIUM CHLORIDE 0.9 % IV SOLN: INTRAVENOUS | Status: DC

## 2014-02-19 MED ORDER — CLOPIDOGREL BISULFATE 75 MG PO TABS
75.0000 mg | ORAL_TABLET | Freq: Every day | ORAL | Status: DC
  Administered 2014-02-20 – 2014-02-21 (×2): 75 mg via ORAL
  Filled 2014-02-19 (×2): qty 1

## 2014-02-19 MED ORDER — ATORVASTATIN CALCIUM 40 MG PO TABS
40.0000 mg | ORAL_TABLET | Freq: Every day | ORAL | Status: DC
  Filled 2014-02-19 (×3): qty 1

## 2014-02-19 NOTE — H&P (Signed)
CARDIOLOGY ADMISSION NOTE  Patient ID: Lindsey Erickson MRN: [id number] DOB/AGE: 12-09-1931 80 y.o.  Admit date: 02/19/2014 Primary Physician   Gwendolyn Grant, MD Primary Cardiologist   Dr. Daneen Schick Chief Complaint    Fullness in the head  HPI:  He patient has a history of CAD as described below.  Her last cath was 2013.  She didn't feel well over the weekend. She was having some nausea vomiting and loose stools. Today after putting on some makeup she started feeling some discomfort in the back of her neck. She might also have had a little discomfort up in her cheeks. This she said has happened before when her blood pressure has fallen. However, her blood pressure actually went up. The discomfort in the back of her neck was intense and 9/10. She said her blood pressure peaked at 193/114. She was nauseated but didn't vomit today. Her daughter said when she got there she was cold and clammy. The patient didn't describe at that time any chest discomfort although she had a little bit of focal soreness on the left side in the emergency room. She did not have any shortness of breath and wasn't describing palpitations, presyncope or syncope. She took her regular daily medications. She was advised by her primary provider to present to the emergency room. Here her EKG has demonstrated no acute abnormalities. Enzymes x1 were negative. She's not had any further discomfort in the back of her neck.   Past Medical History  Diagnosis Date  . Hypothyroidism   . Cholecystitis 02/2010  . Kidney stones   . Osteoporosis     no fractures  . Hyperlipidemia   . Arthritis   . HYPERTENSION   . CAD (coronary artery disease)     mod 3v dz cath 05/21/12 - med mgmt  . CAD (coronary artery disease)     Non -STEMI 2004, thrombus in Cfx and 50 % RCA. Nonischemia Cardiolite 2008  . Recurrent urinary tract infection   . H/O Hashimoto thyroiditis     Past Surgical History  Procedure Laterality Date  .  Appendectomy  1999  . Cholecystectomy  02/2010  . Bilateral arthroscopies of knees    . Joint replacement  01/2010    L TKR - allusio  . Cardiac catheterization      Moderate 3 vessel CAD by cath 05/2012    Allergies  Allergen Reactions  . Other     Frozen Blood Plasma/ shaking, increase temp  . Codeine Nausea And Vomiting    Pt states  . Monte-G Hc     All narcotics   No current facility-administered medications on file prior to encounter.   Current Outpatient Prescriptions on File Prior to Encounter  Medication Sig Dispense Refill  . acetaminophen (TYLENOL) 500 MG tablet Take 1 tablet (500 mg total) by mouth 2 (two) times daily. And every 6hours as needed (max 8 tab in 24hours)  30 tablet  0  . Ascorbic Acid (VITAMIN C) 500 MG tablet Take 500 mg by mouth daily.        Marland Kitchen aspirin 81 MG tablet Take 81 mg by mouth daily.        . Calcium Carbonate-Vitamin D (CALCIUM-VITAMIN D) 500-200 MG-UNIT per tablet Take 1 tablet by mouth 2 (two) times daily with a meal.        . carvedilol (COREG CR) 40 MG 24 hr capsule TAKE 1 CAPSULE EVERY DAY  90 capsule  3  . clopidogrel (PLAVIX) 75 MG  tablet Take 1 tablet (75 mg total) by mouth daily.  90 tablet  3  . CRESTOR 20 MG tablet TAKE 1 TABLET DAILY  90 tablet  3  . hydrochlorothiazide (MICROZIDE) 12.5 MG capsule TAKE 1 CAPSULE DAILY  90 capsule  2  . isosorbide mononitrate (IMDUR) 60 MG 24 hr tablet Take 1 tablet (60 mg total) by mouth daily.  90 tablet  3  . levothyroxine (SYNTHROID, LEVOTHROID) 88 MCG tablet Take 1 tablet (88 mcg total) by mouth daily.  90 tablet  3  . lisinopril (PRINIVIL,ZESTRIL) 40 MG tablet Take 1 tablet (40 mg total) by mouth daily.  90 tablet  3  . Methylsulfonylmethane 1000 MG CAPS Take 1,000 mg by mouth.      . Multiple Vitamin (MULTIVITAMIN WITH MINERALS) TABS Take 1 tablet by mouth daily.      . Niacin (VITAMIN B-3 PO) Take by mouth every morning.      . nitroGLYCERIN (NITROSTAT) 0.4 MG SL tablet Place 1 tablet (0.4 mg  total) under the tongue every 5 (five) minutes as needed. For chest pain  90 tablet  0  . risedronate (ACTONEL) 35 MG tablet Take 1 tablet (35 mg total) by mouth every 7 (seven) days. with water on empty stomach, nothing by mouth or lie down for next 30 minutes.  12 tablet  3   History   Social History  . Marital Status: Married    Spouse Name: N/A    Number of Children: N/A  . Years of Education: N/A   Occupational History  . Not on file.   Social History Main Topics  . Smoking status: Never Smoker   . Smokeless tobacco: Not on file     Comment: Married, 2 children. Retired from [employer]  . Alcohol Use: No  . Drug Use: No  . Sexual Activity:    Other Topics Concern  . Not on file   Social History Narrative  . No narrative on file    Family History  Problem Relation Age of Onset  . Allergies Father   . Cancer Father   . Coronary artery disease Mother 49  . CAD Sister 68     ROS:  As stated in the HPI and negative for all other systems.  Physical Exam: Blood pressure 138/82, pulse 85, temperature 98.2 F (36.8 C), temperature source Oral, resp. rate 19, height 5\' 7"  (1.702 m), weight 179 lb (81.194 kg), SpO2 97.00%.  GENERAL:  Well appearing HEENT:  Pupils equal round and reactive, fundi not visualized, oral mucosa unremarkable NECK:  No jugular venous distention, waveform within normal limits, carotid upstroke brisk and symmetric, no bruits, no thyromegaly LYMPHATICS:  No cervical, inguinal adenopathy LUNGS:  Clear to auscultation bilaterally BACK:  No CVA tenderness CHEST:  Unremarkable HEART:  PMI not displaced or sustained,S1 and S2 within normal limits, no S3, no S4, no clicks, no rubs, no murmurs ABD:  Flat, positive bowel sounds normal in frequency in pitch, no bruits, no rebound, no guarding, no midline pulsatile mass, no hepatomegaly, no splenomegaly EXT:  2 plus pulses throughout, no edema, no cyanosis no clubbing SKIN:  No rashes no nodules NEURO:   Cranial nerves II through XII grossly intact, motor grossly intact throughout PSYCH:  Cognitively intact, oriented to person place and time   Labs: Lab Results  Component Value Date   BUN 20 02/19/2014   Lab Results  Component Value Date   CREATININE 0.65 02/19/2014   Lab Results  Component Value Date   NA 140 02/19/2014   K 4.3 02/19/2014   CL 101 02/19/2014   CO2 25 02/19/2014    Lab Results  Component Value Date   WBC 6.5 02/19/2014   HGB 14.5 02/19/2014   HCT 41.4 02/19/2014   MCV 92.8 02/19/2014   PLT 230 02/19/2014    Lab Results  Component Value Date   ALT 17 02/19/2014   AST 22 02/19/2014   ALKPHOS 67 02/19/2014   BILITOT 0.6 02/19/2014    EKG:  Sinus rhythm, rate 83, axis within normal limits, intervals within normal limits, premature ventricular contractions, no acute ST-T wave changes.  02/19/2014   ASSESSMENT AND PLAN:    NECK PAIN:  This is atypical. However, given her past history she will be observed. I will cycle cardiac enzymes. If she has no further discomfort and enzymes were negative she could be discharged tomorrow for outpatient stress testing. Because of knee problems she would need a  [employer].  HTN:  Tonight I will continue her previous medications. However, she may need med titration of her blood pressure persists.   SignedMinus Breeding 02/19/2014, 6:21 PM

## 2014-02-19 NOTE — Telephone Encounter (Signed)
Patient Information:  Caller Name: Lindsey Erickson  Phone: [phone]  Patient: Lindsey Erickson, Lindsey Erickson  Gender: Female  DOB: 1932-03-28  Age: 80 Years  PCP: Lindsey Erickson (Adults only)  Office Follow Up:  Does the office need to follow up with this patient?: No  Instructions For The Office: N/A   Symptoms  Reason For Call & Symptoms: Lindsey Erickson calling on emergent line and states Mom/Lindsey Erickson states "she does not feel well "onset at 12:00. BP 144/86 @ 12:25. BP 193/114@ 12:44. Dizziness. C/o neck pain - rates 8 or 9  on 1/10 pt scale. States throat feels tight and full. "Head does not feel well." BP at 13:15 is 183/108 with Pulse on 80. Unsteady on feet. Short of breath. Per high blood pressure protocol has go to ED now or to office with PCP approval due to BP > 160/100 amd  any cardiac or neurologic symptoms. Advised to be seen in ED due to throat tightness and fullness, shortness of breath ,neck pain, unsteady gait with BP > 160/100.  Reviewed Health History In EMR: Yes  Reviewed Medications In EMR: Yes  Reviewed Allergies In EMR: Yes  Reviewed Surgeries / Procedures: Yes  Date of Onset of Symptoms: 02/19/2014  Guideline(s) Used:  High Blood Pressure  Disposition Per Guideline:   Go to ED Now (or to Office with PCP Approval)  Reason For Disposition Reached:   BP > 160 / 100 and any cardiac or neurologic symptoms (e.g., chest pain, difficulty breathing, unsteady gait, blurred vision)  Advice Given:  N/A  Patient Will Follow Care Advice:  YES

## 2014-02-19 NOTE — ED Notes (Signed)
Pt reports new onset of "very mild" left sided chest pressure. Shanon Brow, NP made aware is going to see pt now.

## 2014-02-19 NOTE — ED Notes (Addendum)
Pt sts this morning she was having neck pain, throat tightness and "just not feeling like herself", reports normally when she feels this way her BP is low, so she checked it and it was reading higher than normal so she took her BP medication, rechecked BP later on and it was still elevated so pt called her daughter and PCP. PCP suggested she come into the ED for further evaluation. Pt sts that the last time her BP was elevated she had an MI. Pt is on plavix and has hx of 3 blockages, no stents. Denies sob/cp. Pt speaking in full sentences. Nad, skin warm and dry, resp e/u. Pt still having neck pain.

## 2014-02-19 NOTE — ED Notes (Addendum)
Pt reports onset at 12:00 of headache and pain to back of neck. "has a fullness feeling in her throat" and nausea. Report at that time, bp had began to rise and became as high as 196/114. bp has decreased and is now 158/87, reports some decrease in pain but is still nauseated. No neuro deficits noted at triage. Reports recent GI virus with n/v.

## 2014-02-19 NOTE — Progress Notes (Signed)
ANTICOAGULATION CONSULT NOTE - Initial Consult  Pharmacy Consult for Heparin  Indication: chest pain/ACS, +trop  Allergies  Allergen Reactions  . 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   ALL NARCOTICS- NAUSEA AND VOMITING   . Adhesive [Tape] Itching, Swelling, Rash and Other (See Comments)    Also pain at location  . Codeine Nausea And Vomiting    Patient Measurements: Height: 5\' 7"  (170.2 cm) Weight: 173 lb 14.4 oz (78.881 kg) IBW/kg (Calculated) : 61.6  Vital Signs: Temp: 98.8 F (37.1 C) (03/31 2015) Temp src: Oral (03/31 2015) BP: 130/70 mmHg (03/31 2300) Pulse Rate: 81 (03/31 2015)  Labs:  Recent Labs  02/19/14 1419 02/19/14 2145  HGB 14.5  --   HCT 41.4  --   PLT 230  --   CREATININE 0.65  --   TROPONINI  --  0.42*    Estimated Creatinine Clearance: 61.7 ml/min (by C-G formula based on Cr of 0.65).   Medical History: Past Medical History  Diagnosis Date  . Hypothyroidism   . Cholecystitis 02/2010  . Kidney stones   . Osteoporosis     no fractures  . Hyperlipidemia   . Arthritis   . HYPERTENSION   . CAD (coronary artery disease)     mod 3v dz cath 05/21/12 - med mgmt  . CAD (coronary artery disease)     Non -STEMI 2004, thrombus in Cfx and 50 % RCA. Nonischemia Cardiolite 2008  . Recurrent urinary tract infection   . H/O Hashimoto thyroiditis     Assessment: 80 y/o F here with neck pain/tightness, troponin came back at 0.42, CBC good, renal function good, to start heparin per pharmacy.   Goal of Therapy:  Heparin level 0.3-0.7 units/ml Monitor platelets by anticoagulation protocol: Yes   Plan:  -Heparin 4000 units BOLUS x 1, then start heparin drip at 900 units/hr -0800 HL -Daily CBC/HL -Monitor for bleeding  Narda Bonds 02/19/2014,11:35 PM

## 2014-02-19 NOTE — ED Notes (Signed)
Cardiologist at bedside.  

## 2014-02-19 NOTE — Progress Notes (Signed)
CRITICAL VALUE ALERT  Critical value received: Troponin 0.42  Date of notification:  03/31-2015  Time of notification:  2330  Critical value read back: Yes  Nurse who received alert:  Jamal Collin RN  MD notified (1st page):  Dr. Izetta Dakin Time of first page:  2330  MD notified (2nd page):  Time of second page:  Responding MD:  Izetta Dakin  Time MD responded:  2332

## 2014-02-19 NOTE — ED Provider Notes (Signed)
CSN: [id number]     Arrival date & time 02/19/14  1343 History   First MD Initiated Contact with Patient 02/19/14 1550     Chief Complaint  Patient presents with  . Hypertension     (Consider location/radiation/quality/duration/timing/severity/associated sxs/prior Treatment) Patient is a 80 y.o. female presenting with hypertension. The history is provided by the patient and medical records. No language interpreter was used.  Hypertension This is a recurrent problem. The current episode started today. The problem has been waxing and waning. Associated symptoms include fatigue, nausea and neck pain. Pertinent negatives include no chest pain, headaches or vomiting. She has tried rest for the symptoms. The treatment provided mild relief.  Patient states she was applying her makeup this morning when she developed jaw and neck pain, mild nausea, a feeling of "fullness" in her head and throat, mild shortness of breath and generally did not "feel well".  She checked her blood pressure and found it to be elevated.  Denies visual change, gait disturbance, headache. Has CAD history, and presented with similar symptoms (elevated blood pressure, jaw pain) with prior cardiac event.     Past Medical History  Diagnosis Date  . Hypothyroidism   . Cholecystitis 02/2010  . Kidney stones   . Osteoporosis     no fractures  . Hyperlipidemia   . Arthritis   . HYPERTENSION   . CAD (coronary artery disease)     mod 3v dz cath 05/21/12 - med mgmt  . CAD (coronary artery disease)     Non -STEMI 2004, thrombus in Cfx and 50 % RCA. Nonischemia Cardiolite 2008  . Recurrent urinary tract infection   . H/O Hashimoto thyroiditis    Past Surgical History  Procedure Laterality Date  . Appendectomy  1999  . Knee repalcement  2011  . Cholecystectomy  02/2010  . Bilateral arthroscopies of knees    . Joint replacement  01/2010    L TKR - allusio  . Cardiac catheterization      Moderate 3 vessel CAD by cath 05/2012    Family History  Problem Relation Age of Onset  . Allergies Father   . Cancer Father   . Coronary artery disease Mother    History  Substance Use Topics  . Smoking status: Never Smoker   . Smokeless tobacco: Not on file     Comment: Married, 2 children. Retired from [employer]  . Alcohol Use: No   OB History   Grav Para Term Preterm Abortions TAB SAB Ect Mult Living                 Review of Systems  Constitutional: Positive for fatigue.  HENT: Positive for trouble swallowing.   Respiratory: Positive for shortness of breath.   Cardiovascular: Negative for chest pain.  Gastrointestinal: Positive for nausea. Negative for vomiting.  Musculoskeletal: Positive for neck pain.  Neurological: Negative for headaches.  All other systems reviewed and are negative.      Allergies  Other; Codeine; and Monte-g hc  Home Medications   Current Outpatient Rx  Name  Route  Sig  Dispense  Refill  . acetaminophen (TYLENOL) 500 MG tablet   Oral   Take 1 tablet (500 mg total) by mouth 2 (two) times daily. And every 6hours as needed (max 8 tab in 24hours)   30 tablet   0   . Ascorbic Acid (VITAMIN C) 500 MG tablet   Oral   Take 500 mg by mouth daily.           Marland Kitchen  aspirin 81 MG tablet   Oral   Take 81 mg by mouth daily.           . Calcium Carbonate-Vitamin D (CALCIUM-VITAMIN D) 500-200 MG-UNIT per tablet   Oral   Take 1 tablet by mouth 2 (two) times daily with a meal.           . carvedilol (COREG CR) 40 MG 24 hr capsule      TAKE 1 CAPSULE EVERY DAY   90 capsule   3   . clopidogrel (PLAVIX) 75 MG tablet   Oral   Take 1 tablet (75 mg total) by mouth daily.   90 tablet   3   . CRESTOR 20 MG tablet      TAKE 1 TABLET DAILY   90 tablet   3   . hydrochlorothiazide (MICROZIDE) 12.5 MG capsule      TAKE 1 CAPSULE DAILY   90 capsule   2   . isosorbide mononitrate (IMDUR) 60 MG 24 hr tablet   Oral   Take 1 tablet (60 mg total) by mouth daily.   90  tablet   3   . levothyroxine (SYNTHROID, LEVOTHROID) 88 MCG tablet   Oral   Take 1 tablet (88 mcg total) by mouth daily.   90 tablet   3     Dispense as written.   Marland Kitchen lisinopril (PRINIVIL,ZESTRIL) 40 MG tablet   Oral   Take 1 tablet (40 mg total) by mouth daily.   90 tablet   3   . Methylsulfonylmethane 1000 MG CAPS   Oral   Take 1,000 mg by mouth.         . Multiple Vitamin (MULTIVITAMIN WITH MINERALS) TABS   Oral   Take 1 tablet by mouth daily.         . Niacin (VITAMIN B-3 PO)   Oral   Take by mouth every morning.         . nitroGLYCERIN (NITROSTAT) 0.4 MG SL tablet   Sublingual   Place 1 tablet (0.4 mg total) under the tongue every 5 (five) minutes as needed. For chest pain   90 tablet   0   . risedronate (ACTONEL) 35 MG tablet   Oral   Take 1 tablet (35 mg total) by mouth every 7 (seven) days. with water on empty stomach, nothing by mouth or lie down for next 30 minutes.   12 tablet   3    BP 154/90  Pulse 85  Temp(Src) 98.2 F (36.8 C) (Oral)  Resp 16  Ht 5\' 7"  (1.702 m)  Wt 179 lb (81.194 kg)  BMI 28.03 kg/m2  SpO2 97% Physical Exam  Nursing note and vitals reviewed. Constitutional: She is oriented to person, place, and time. She appears well-developed and well-nourished.  HENT:  Head: Normocephalic.  Neck: Normal range of motion. Neck supple. No JVD present. Carotid bruit is not present.  Cardiovascular: Normal rate.   Pulmonary/Chest: Effort normal and breath sounds normal. No respiratory distress.  Abdominal: Soft. Bowel sounds are normal.  Musculoskeletal: Normal range of motion. She exhibits no edema and no tenderness.  Lymphadenopathy:    She has no cervical adenopathy.  Neurological: She is alert and oriented to person, place, and time. She has normal strength. No cranial nerve deficit or sensory deficit. GCS eye subscore is 4. GCS verbal subscore is 5. GCS motor subscore is 6.  Skin: Skin is warm and dry.  Psychiatric: She has a  normal mood  and affect. Her behavior is normal. Judgment and thought content normal.    ED Course  Procedures (including critical care time) Labs Review Labs Reviewed  COMPREHENSIVE METABOLIC PANEL - Abnormal; Notable for the following:    Glucose, Bld 112 (*)    GFR calc non Af Amer 82 (*)    All other components within normal limits  CBC  URINALYSIS, ROUTINE W REFLEX MICROSCOPIC  CBG MONITORING, ED  I-STAT TROPOININ, ED   Imaging Review No results found.   EKG Interpretation None     Atypical ACS presentation, cardiology consult requested.  Patient seen by cardiology, will admit. MDM   Final diagnoses:  None   Neck pain. Hypertension.     Norman Herrlich, NP 02/19/14 1905

## 2014-02-19 NOTE — ED Notes (Signed)
Attempted to call report

## 2014-02-20 ENCOUNTER — Encounter (HOSPITAL_COMMUNITY)
Admission: EM | Disposition: A | Discharge status: Home / Self Care | Payer: Medicare Other | Source: Home / Self Care | Attending: Emergency Medicine

## 2014-02-20 DIAGNOSIS — I219 Acute myocardial infarction, unspecified: Secondary | ICD-10-CM

## 2014-02-20 DIAGNOSIS — I4581 Long QT syndrome: Secondary | ICD-10-CM

## 2014-02-20 DIAGNOSIS — I2510 Atherosclerotic heart disease of native coronary artery without angina pectoris: Secondary | ICD-10-CM

## 2014-02-20 HISTORY — PX: LEFT HEART CATHETERIZATION WITH CORONARY ANGIOGRAM: SHX5451

## 2014-02-20 LAB — BASIC METABOLIC PANEL
BUN: 20 mg/dL (ref 6–23)
CO2: 25 mEq/L (ref 19–32)
Calcium: 9.3 mg/dL (ref 8.4–10.5)
Chloride: 102 mEq/L (ref 96–112)
Creatinine, Ser: 0.75 mg/dL (ref 0.50–1.10)
GFR calc Af Amer: >90 mL/min (ref >90)
GFR calc non Af Amer: 78 mL/min — ABNORMAL LOW (ref >90)
Glucose, Bld: 101 mg/dL — ABNORMAL HIGH (ref 70–99)
Potassium: 4.1 mEq/L (ref 3.7–5.3)
Sodium: 141 mEq/L (ref 137–147)

## 2014-02-20 LAB — CBC
HCT: 39.8 % (ref 36.0–46.0)
Hemoglobin: 13.8 g/dL (ref 12.0–15.0)
MCH: 32.5 pg (ref 26.0–34.0)
MCHC: 34.7 g/dL (ref 30.0–36.0)
MCV: 93.6 fL (ref 78.0–100.0)
PLATELETS: 218 10*3/uL (ref 150–400)
RBC: 4.25 MIL/uL (ref 3.87–5.11)
RDW: 12.9 % (ref 11.5–15.5)
WBC: 6 10*3/uL (ref 4.0–10.5)

## 2014-02-20 LAB — PROTIME-INR
INR: 1.01 (ref 0.00–1.49)
Prothrombin Time: 13.1 seconds (ref 11.6–15.2)

## 2014-02-20 LAB — HEPARIN LEVEL (UNFRACTIONATED): Heparin Unfractionated: 0.49 IU/mL (ref 0.30–0.70)

## 2014-02-20 LAB — POCT ACTIVATED CLOTTING TIME: ACTIVATED CLOTTING TIME: 293 s

## 2014-02-20 LAB — TROPONIN I
Troponin I: <0.3 ng/mL (ref <0.30)
Troponin I: <0.3 ng/mL (ref <0.30)

## 2014-02-20 SURGERY — LEFT HEART CATHETERIZATION WITH CORONARY ANGIOGRAM
Anesthesia: LOCAL

## 2014-02-20 MED ORDER — ADENOSINE 12 MG/4ML IV SOLN
16.0000 mL | Freq: Once | INTRAVENOUS | Status: DC
  Filled 2014-02-20: qty 16

## 2014-02-20 MED ORDER — MIDAZOLAM HCL 2 MG/2ML IJ SOLN
INTRAMUSCULAR | Status: AC
  Filled 2014-02-20: qty 2

## 2014-02-20 MED ORDER — HEPARIN SODIUM (PORCINE) 1000 UNIT/ML IJ SOLN
INTRAMUSCULAR | Status: AC
  Filled 2014-02-20: qty 1

## 2014-02-20 MED ORDER — ACETAMINOPHEN 500 MG PO TABS: 500.0000 mg | ORAL_TABLET | Freq: Four times a day (QID) | ORAL | Status: DC | PRN

## 2014-02-20 MED ORDER — HEPARIN (PORCINE) IN NACL 2-0.9 UNIT/ML-% IJ SOLN
INTRAMUSCULAR | Status: AC
  Filled 2014-02-20: qty 1000

## 2014-02-20 MED ORDER — LIDOCAINE HCL (PF) 1 % IJ SOLN
INTRAMUSCULAR | Status: AC
  Filled 2014-02-20: qty 30

## 2014-02-20 MED ORDER — CARVEDILOL PHOSPHATE ER 40 MG PO CP24: 40.0000 mg | ORAL_CAPSULE | Freq: Every day | ORAL | Status: DC

## 2014-02-20 MED ORDER — HEPARIN (PORCINE) IN NACL 2-0.9 UNIT/ML-% IJ SOLN
INTRAMUSCULAR | Status: AC
  Filled 2014-02-20: qty 500

## 2014-02-20 MED ORDER — SODIUM CHLORIDE 0.9 % IV SOLN: 250.0000 mL | INTRAVENOUS | Status: DC | PRN

## 2014-02-20 MED ORDER — DIAZEPAM 5 MG PO TABS
5.0000 mg | ORAL_TABLET | ORAL | Status: AC
  Administered 2014-02-20: 5 mg via ORAL
  Filled 2014-02-20: qty 1

## 2014-02-20 MED ORDER — NITROGLYCERIN 0.2 MG/ML ON CALL CATH LAB
INTRAVENOUS | Status: AC
  Filled 2014-02-20: qty 1

## 2014-02-20 MED ORDER — VERAPAMIL HCL 2.5 MG/ML IV SOLN
INTRAVENOUS | Status: AC
  Filled 2014-02-20: qty 2

## 2014-02-20 MED ORDER — SODIUM CHLORIDE 0.9 % IJ SOLN: 3.0000 mL | Freq: Two times a day (BID) | INTRAMUSCULAR | Status: DC

## 2014-02-20 MED ORDER — FENTANYL CITRATE 0.05 MG/ML IJ SOLN
INTRAMUSCULAR | Status: AC
  Filled 2014-02-20: qty 2

## 2014-02-20 MED ORDER — SODIUM CHLORIDE 0.9 % IJ SOLN: 3.0000 mL | INTRAMUSCULAR | Status: DC | PRN

## 2014-02-20 MED ORDER — SODIUM CHLORIDE 0.9 % IV SOLN: 1.0000 mL/kg/h | INTRAVENOUS | Status: DC

## 2014-02-20 NOTE — Interval H&P Note (Signed)
Cath Lab Visit (complete for each Cath Lab visit)  Clinical Evaluation Leading to the Procedure:   ACS: yes  Non-ACS:    Anginal Classification: CCS IV  Anti-ischemic medical therapy: Minimal Therapy (1 class of medications)  Non-Invasive Test Results: No non-invasive testing performed  Prior CABG: No previous CABG      History and Physical Interval Note:  02/20/2014 4:03 PM  Lindsey Erickson  has presented today for surgery, with the diagnosis of cp  The various methods of treatment have been discussed with the patient and family. After consideration of risks, benefits and other options for treatment, the patient has consented to  Procedure(s): LEFT HEART CATHETERIZATION WITH CORONARY ANGIOGRAM (N/A) as a surgical intervention .  The patient's history has been reviewed, patient examined, no change in status, stable for surgery.  I have reviewed the patient's chart and labs.  Questions were answered to the patient's satisfaction.     Javen Hinderliter S.

## 2014-02-20 NOTE — H&P (View-Only) (Signed)
SUBJECTIVE:  No neck pain this AM.  No SOB.     PHYSICAL EXAM Filed Vitals:   02/19/14 2300 02/20/14 0000 02/20/14 0400 02/20/14 0758  BP: 130/70 100/59 119/62 124/69  Pulse:  77 79 81  Temp:  98.9 F (37.2 C) 98.4 F (36.9 C)   TempSrc:  Oral Oral Oral  Resp:  20 20 20   Height:      Weight:  173 lb 14.4 oz (78.881 kg)    SpO2:  92% 96% 96%   General:  No distress Lungs:  Clear Heart:  RRR Abdomen:  Positive bowel sounds, no rebound no guarding Extremities:  No edema Neuro:  Nonfocal  LABS: Lab Results  Component Value Date   TROPONINI <0.30 02/20/2014   Results for orders placed during the hospital encounter of 02/19/14 (from the past 24 hour(s))  CBC     Status: None   Collection Time    02/19/14  2:19 PM      Result Value Ref Range   WBC 6.5  4.0 - 10.5 K/uL   RBC 4.46  3.87 - 5.11 MIL/uL   Hemoglobin 14.5  12.0 - 15.0 g/dL   HCT 41.4  36.0 - 46.0 %   MCV 92.8  78.0 - 100.0 fL   MCH 32.5  26.0 - 34.0 pg   MCHC 35.0  30.0 - 36.0 g/dL   RDW 12.6  11.5 - 15.5 %   Platelets 230  150 - 400 K/uL  COMPREHENSIVE METABOLIC PANEL     Status: Abnormal   Collection Time    02/19/14  2:19 PM      Result Value Ref Range   Sodium 140  137 - 147 mEq/L   Potassium 4.3  3.7 - 5.3 mEq/L   Chloride 101  96 - 112 mEq/L   CO2 25  19 - 32 mEq/L   Glucose, Bld 112 (*) 70 - 99 mg/dL   BUN 20  6 - 23 mg/dL   Creatinine, Ser 0.65  0.50 - 1.10 mg/dL   Calcium 10.1  8.4 - 10.5 mg/dL   Total Protein 7.8  6.0 - 8.3 g/dL   Albumin 4.1  3.5 - 5.2 g/dL   AST 22  0 - 37 U/L   ALT 17  0 - 35 U/L   Alkaline Phosphatase 67  39 - 117 U/L   Total Bilirubin 0.6  0.3 - 1.2 mg/dL   GFR calc non Af Amer 82 (*) >90 mL/min   GFR calc Af Amer >90  >90 mL/min  I-STAT TROPOININ, ED     Status: None   Collection Time    02/19/14  4:43 PM      Result Value Ref Range   Troponin i, poc 0.04  0.00 - 0.08 ng/mL   Comment 3           TROPONIN I     Status: Abnormal   Collection Time    02/19/14   9:45 PM      Result Value Ref Range   Troponin I 0.42 (*) <0.30 ng/mL  URINALYSIS, ROUTINE W REFLEX MICROSCOPIC     Status: Abnormal   Collection Time    02/19/14 10:35 PM      Result Value Ref Range   Color, Urine YELLOW  YELLOW   APPearance CLEAR  CLEAR   Specific Gravity, Urine 1.017  1.005 - 1.030   pH 6.0  5.0 - 8.0   Glucose, UA NEGATIVE  NEGATIVE mg/dL  Hgb urine dipstick NEGATIVE  NEGATIVE   Bilirubin Urine NEGATIVE  NEGATIVE   Ketones, ur 40 (*) NEGATIVE mg/dL   Protein, ur NEGATIVE  NEGATIVE mg/dL   Urobilinogen, UA 1.0  0.0 - 1.0 mg/dL   Nitrite NEGATIVE  NEGATIVE   Leukocytes, UA SMALL (*) NEGATIVE  URINE MICROSCOPIC-ADD ON     Status: None   Collection Time    02/19/14 10:35 PM      Result Value Ref Range   Squamous Epithelial / LPF RARE  RARE   WBC, UA 3-6  <3 WBC/hpf   RBC / HPF 0-2  <3 RBC/hpf   Bacteria, UA RARE  RARE  TROPONIN I     Status: None   Collection Time    02/20/14  5:10 AM      Result Value Ref Range   Troponin I <0.30  <0.30 ng/mL  CBC     Status: None   Collection Time    02/20/14  7:48 AM      Result Value Ref Range   WBC 6.0  4.0 - 10.5 K/uL   RBC 4.25  3.87 - 5.11 MIL/uL   Hemoglobin 13.8  12.0 - 15.0 g/dL   HCT 39.8  36.0 - 46.0 %   MCV 93.6  78.0 - 100.0 fL   MCH 32.5  26.0 - 34.0 pg   MCHC 34.7  30.0 - 36.0 g/dL   RDW 12.9  11.5 - 15.5 %   Platelets 218  150 - 400 K/uL   No intake or output data in the 24 hours ending 02/20/14 0832  EKG:  NSR, rate 76, QT prolonged, no acute ST T wave changes.  02/20/2014   ASSESSMENT AND PLAN:  CHEST PAIN:  Positive enzymes.  Needs cath.  The patient understands that risks included but are not limited to stroke (1 in 1000), death (1 in 8), kidney failure [usually temporary] (1 in 500), bleeding (1 in 200), allergic reaction [possibly serious] (1 in 200).  The patient understands and agrees to proceed.   HTN:  BP is back to baseline.    PROLONGED QT:  This has not been a part of her  history previously.  Follow.  No history of syncope or arrhythmias.    Jeneen Rinks [facility] 02/20/2014 8:32 AM

## 2014-02-20 NOTE — CV Procedure (Signed)
       PROCEDURE:  Left heart catheterization with selective coronary angiography, left ventriculogram.  FFR RCA  INDICATIONS:  NSTEMI  The risks, benefits, and details of the procedure were explained to the patient.  The patient verbalized understanding and wanted to proceed.  Informed written consent was obtained.  PROCEDURE TECHNIQUE:  After Xylocaine anesthesia a 45F slender sheath was placed in the right radial artery with a single anterior needle wall stick.   Right coronary angiography was done using a Judkins R4 guide catheter.  Left coronary angiography was done using a Judkins L3.5 guide catheter.  Left ventriculography was done using a pigtail catheter.  A TR band was used for hemostasis.   CONTRAST:  Total of 110 cc.  COMPLICATIONS:  None.    HEMODYNAMICS:  Aortic pressure was 103/60; LV pressure was 100/9; LVEDP 14.  There was no gradient between the left ventricle and aorta.    ANGIOGRAPHIC DATA:   The left main coronary artery is widely patent. There is mild ostial disease.  The left anterior descending artery is a large vessel which reaches the apex. There is an ostial 40-50% stenosis. There several medium-size diagonals which are patent.  The left circumflex artery is a large vessel proximally. In the mid vessel, there is a 50% stenosis. There is a large ramus vessel which is also widely patent. There several small to medium-sized obtuse marginals which appear patent.  The right coronary artery is a large dominant vessel. In the mid vessel, there is a 50-60% stenosis. There is an early bifurcation of the posterior lateral artery and the posterior descending artery.Marland Kitchen  LEFT VENTRICULOGRAM:  Left ventricular angiogram was done in the 30 RAO projection and revealed normal left ventricular wall motion and systolic function with an estimated ejection fraction of 60 %.  LVEDP was 14 mmHg.  PCI NARRATIVE: A Williams right guiding catheter was used to engage the RCA. A pressure  wire was placed across the area disease in the mid right coronary artery. The resting FFR was 0.98. After IV adenosine, the FFR was 0.90.  IMPRESSIONS:  1. Widely patent left main coronary artery. 2. Unchanged, moderate left anterior descending artery disease with patent branches. 3. Widely patent left circumflex artery and its branches with mild disease. 4. Moderate mid right coronary artery disease which was found to not be significant by FFR. 5. Normal left ventricular systolic function.  LVEDP 14 mmHg.  Ejection fraction 60%.  RECOMMENDATION:  Continue medical therapy. Likely discharge in a.m.  Results were explained to the family and the patient and all questions were answered. Followup with Dr. [name].

## 2014-02-20 NOTE — Progress Notes (Signed)
SUBJECTIVE:  No neck pain this AM.  No SOB.     PHYSICAL EXAM Filed Vitals:   02/19/14 2300 02/20/14 0000 02/20/14 0400 02/20/14 0758  BP: 130/70 100/59 119/62 124/69  Pulse:  77 79 81  Temp:  98.9 F (37.2 C) 98.4 F (36.9 C)   TempSrc:  Oral Oral Oral  Resp:  20 20 20   Height:      Weight:  173 lb 14.4 oz (78.881 kg)    SpO2:  92% 96% 96%   General:  No distress Lungs:  Clear Heart:  RRR Abdomen:  Positive bowel sounds, no rebound no guarding Extremities:  No edema Neuro:  Nonfocal  LABS: Lab Results  Component Value Date   TROPONINI <0.30 02/20/2014   Results for orders placed during the hospital encounter of 02/19/14 (from the past 24 hour(s))  CBC     Status: None   Collection Time    02/19/14  2:19 PM      Result Value Ref Range   WBC 6.5  4.0 - 10.5 K/uL   RBC 4.46  3.87 - 5.11 MIL/uL   Hemoglobin 14.5  12.0 - 15.0 g/dL   HCT 41.4  36.0 - 46.0 %   MCV 92.8  78.0 - 100.0 fL   MCH 32.5  26.0 - 34.0 pg   MCHC 35.0  30.0 - 36.0 g/dL   RDW 12.6  11.5 - 15.5 %   Platelets 230  150 - 400 K/uL  COMPREHENSIVE METABOLIC PANEL     Status: Abnormal   Collection Time    02/19/14  2:19 PM      Result Value Ref Range   Sodium 140  137 - 147 mEq/L   Potassium 4.3  3.7 - 5.3 mEq/L   Chloride 101  96 - 112 mEq/L   CO2 25  19 - 32 mEq/L   Glucose, Bld 112 (*) 70 - 99 mg/dL   BUN 20  6 - 23 mg/dL   Creatinine, Ser 0.65  0.50 - 1.10 mg/dL   Calcium 10.1  8.4 - 10.5 mg/dL   Total Protein 7.8  6.0 - 8.3 g/dL   Albumin 4.1  3.5 - 5.2 g/dL   AST 22  0 - 37 U/L   ALT 17  0 - 35 U/L   Alkaline Phosphatase 67  39 - 117 U/L   Total Bilirubin 0.6  0.3 - 1.2 mg/dL   GFR calc non Af Amer 82 (*) >90 mL/min   GFR calc Af Amer >90  >90 mL/min  I-STAT TROPOININ, ED     Status: None   Collection Time    02/19/14  4:43 PM      Result Value Ref Range   Troponin i, poc 0.04  0.00 - 0.08 ng/mL   Comment 3           TROPONIN I     Status: Abnormal   Collection Time    02/19/14   9:45 PM      Result Value Ref Range   Troponin I 0.42 (*) <0.30 ng/mL  URINALYSIS, ROUTINE W REFLEX MICROSCOPIC     Status: Abnormal   Collection Time    02/19/14 10:35 PM      Result Value Ref Range   Color, Urine YELLOW  YELLOW   APPearance CLEAR  CLEAR   Specific Gravity, Urine 1.017  1.005 - 1.030   pH 6.0  5.0 - 8.0   Glucose, UA NEGATIVE  NEGATIVE mg/dL  Hgb urine dipstick NEGATIVE  NEGATIVE   Bilirubin Urine NEGATIVE  NEGATIVE   Ketones, ur 40 (*) NEGATIVE mg/dL   Protein, ur NEGATIVE  NEGATIVE mg/dL   Urobilinogen, UA 1.0  0.0 - 1.0 mg/dL   Nitrite NEGATIVE  NEGATIVE   Leukocytes, UA SMALL (*) NEGATIVE  URINE MICROSCOPIC-ADD ON     Status: None   Collection Time    02/19/14 10:35 PM      Result Value Ref Range   Squamous Epithelial / LPF RARE  RARE   WBC, UA 3-6  <3 WBC/hpf   RBC / HPF 0-2  <3 RBC/hpf   Bacteria, UA RARE  RARE  TROPONIN I     Status: None   Collection Time    02/20/14  5:10 AM      Result Value Ref Range   Troponin I <0.30  <0.30 ng/mL  CBC     Status: None   Collection Time    02/20/14  7:48 AM      Result Value Ref Range   WBC 6.0  4.0 - 10.5 K/uL   RBC 4.25  3.87 - 5.11 MIL/uL   Hemoglobin 13.8  12.0 - 15.0 g/dL   HCT 39.8  36.0 - 46.0 %   MCV 93.6  78.0 - 100.0 fL   MCH 32.5  26.0 - 34.0 pg   MCHC 34.7  30.0 - 36.0 g/dL   RDW 12.9  11.5 - 15.5 %   Platelets 218  150 - 400 K/uL   No intake or output data in the 24 hours ending 02/20/14 0832  EKG:  NSR, rate 76, QT prolonged, no acute ST T wave changes.  02/20/2014   ASSESSMENT AND PLAN:  CHEST PAIN:  Positive enzymes.  Needs cath.  The patient understands that risks included but are not limited to stroke (1 in 1000), death (1 in 8), kidney failure [usually temporary] (1 in 500), bleeding (1 in 200), allergic reaction [possibly serious] (1 in 200).  The patient understands and agrees to proceed.   HTN:  BP is back to baseline.    PROLONGED QT:  This has not been a part of her  history previously.  Follow.  No history of syncope or arrhythmias.    Lindsey Erickson [facility] 02/20/2014 8:32 AM

## 2014-02-20 NOTE — ED Provider Notes (Signed)
Medical screening examination/treatment/procedure(s) were conducted as a shared visit with non-physician practitioner(s) and myself.  I personally evaluated the patient during the encounter.   EKG Interpretation   Date/Time:  Tuesday February 19 2014 14:35:10 EDT Ventricular Rate:  83 PR Interval:  152 QRS Duration: 80 QT Interval:  372 QTC Calculation: 437 R Axis:   20 Text Interpretation:  Normal sinus rhythm Nonspecific ST and T wave  abnormality Abnormal ECG Confirmed by Rosmary Dionisio  MD, Kriti Katayama ([id number]) on  02/19/2014 4:19:00 PM      Patient with atypical ACS symptoms (neck pain, dyspnea) in setting of HTN. Similar symptoms last time she had an MI. Will need admission. Symptoms free in ED.  Ephraim Hamburger, MD 02/20/14 [phone]

## 2014-02-20 NOTE — Progress Notes (Signed)
ANTICOAGULATION CONSULT NOTE - Follow Up Consult  Pharmacy Consult for Heparin Indication: chest pain/ACS  Allergies  Allergen Reactions  . 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   ALL NARCOTICS- NAUSEA AND VOMITING   . Adhesive [Tape] Itching, Swelling, Rash and Other (See Comments)    Also pain at location  . Codeine Nausea And Vomiting    Patient Measurements: Height: 5\' 7"  (170.2 cm) Weight: 173 lb 14.4 oz (78.881 kg) IBW/kg (Calculated) : 61.6 Heparin Dosing Weight:78.8kg  Vital Signs: Temp: 98.4 F (36.9 C) (04/01 0400) Temp src: Oral (04/01 0758) BP: 124/69 mmHg (04/01 0758) Pulse Rate: 81 (04/01 0758)  Labs:  Recent Labs  02/19/14 1419 02/19/14 2145 02/20/14 0510 02/20/14 0748  HGB 14.5  --   --  13.8  HCT 41.4  --   --  39.8  PLT 230  --   --  218  HEPARINUNFRC  --   --   --  0.49  CREATININE 0.65  --   --   --   TROPONINI  --  0.42* <0.30  --     Estimated Creatinine Clearance: 61.7 ml/min (by C-G formula based on Cr of 0.65).  Medications:  Heparin @ 900 units/hr  Assessment: 81yof continues on heparin for chest pain. Only one troponin has been positive. Initial heparin level is therapeutic. CBC stable. No bleeding reported.  Goal of Therapy:  Heparin level 0.3-0.7 units/ml Monitor platelets by anticoagulation protocol: Yes   Plan:  1) Continue heparin at 900 units/hr 2) Will order confirmatory level is patient is not discharged   Deboraha Sprang 02/20/2014,8:46 AM

## 2014-02-20 NOTE — Progress Notes (Signed)
TR BAND REMOVAL  LOCATION:    right radial  DEFLATED PER PROTOCOL:    yes  TIME BAND OFF / DRESSING APPLIED:    19:35   SITE UPON ARRIVAL:    Level 0  SITE AFTER BAND REMOVAL:    Level 0  REVERSE ALLEN'S TEST:     positive  CIRCULATION SENSATION AND MOVEMENT:    Within Normal Limits   yes  COMMENTS:

## 2014-02-21 ENCOUNTER — Encounter (HOSPITAL_COMMUNITY): Payer: Self-pay | Admitting: Physician Assistant

## 2014-02-21 DIAGNOSIS — I214 Non-ST elevation (NSTEMI) myocardial infarction: Secondary | ICD-10-CM

## 2014-02-21 DIAGNOSIS — R9431 Abnormal electrocardiogram [ECG] [EKG]: Secondary | ICD-10-CM | POA: Diagnosis present

## 2014-02-21 DIAGNOSIS — I2510 Atherosclerotic heart disease of native coronary artery without angina pectoris: Secondary | ICD-10-CM | POA: Diagnosis present

## 2014-02-21 DIAGNOSIS — R072 Precordial pain: Secondary | ICD-10-CM | POA: Diagnosis present

## 2014-02-21 DIAGNOSIS — E785 Hyperlipidemia, unspecified: Secondary | ICD-10-CM | POA: Diagnosis present

## 2014-02-21 LAB — CBC
HCT: 35.2 % — ABNORMAL LOW (ref 36.0–46.0)
Hemoglobin: 12.2 g/dL (ref 12.0–15.0)
MCH: 32.6 pg (ref 26.0–34.0)
MCHC: 34.7 g/dL (ref 30.0–36.0)
MCV: 94.1 fL (ref 78.0–100.0)
Platelets: 187 10*3/uL (ref 150–400)
RBC: 3.74 MIL/uL — ABNORMAL LOW (ref 3.87–5.11)
RDW: 12.9 % (ref 11.5–15.5)
WBC: 5.6 10*3/uL (ref 4.0–10.5)

## 2014-02-21 MED ORDER — ASPIRIN 81 MG PO CHEW: 81.0000 mg | CHEWABLE_TABLET | Freq: Every day | ORAL | Status: DC

## 2014-02-21 NOTE — Discharge Summary (Signed)
Discharge Summary   Patient ID: Lindsey Erickson MRN: [id number], DOB/AGE: Nov 17, 1932 80 y.o. Admit date: 02/19/2014 D/C date:     02/21/2014  Primary Care Provider: Gwendolyn Grant, MD Primary Cardiologist: [name]  Primary Discharge Diagnoses:  1. CAD/NSTEMI - cath 02/20/14: patent LM & Cx, moderate LAD - unchanged, moderate mRCA - not significant by FFR - history of NSTEMI 2004 with thrombus in Cx and mod RCA, nonischemic nuc 2008 2. HTN with softer BPs - HCTZ discontinued 3. Hyperlipidemia 4. QT prolongation  Secondary Discharge Diagnoses:  1. Hypothyroidism 2. H/o kidney stones 3. Osteoporosis 4. Arthritis 5. History of hashimoto thyroiditis  Hospital Course: Lindsey Erickson is a 80 y/o F with history of moderate CAD in the past who presented to [facility] with multiple complaints. She didn't feel well over the weekend. She was having some nausea vomiting and loose stools. On day of admission after putting on some makeup she started feeling some discomfort in the back of her neck, rated 9/10 associated with markedly elevated BP of 193/114. She was nauseated. She initially did not have any chest discomfort but in the ER had a little bit of focal L sided chest soreness. She denied SOB, palpitations, presyncope or syncope. She reported compliance with meds. In the ER EKG was nonacute and initial troponin was negative. At time of cardiology evaluation, she had no further discomfort in the back of her neck. This was initially felt atypical. However, 2nd troponin was 0.42 (ref <0.30) thus she was deemed to have had an NSTEMI. Followup troponins were negative. She was started on IV heparin and cardiac cath was recommended. This demonstrated:  Widely patent left main coronary artery.  Unchanged, moderate left anterior descending artery disease with patent branches.  Widely patent left circumflex artery and its branches with mild disease.  Moderate mid right coronary artery disease which  was found to not be significant by FFR.  Normal left ventricular systolic function.  LVEDP 14 mmHg. Ejection fraction 60%.  Continued medical therapy was recommended. Although presenting BP was high, her BP actually tended to run on the low side for the remainder of her her hospitalization. HCTZ was not given during hospitalization and will be held at discharge. EKG on 4/1 did show QT prolongation to 524 (prior QTcs in the 470-490 range). Repeat EKG showed similar findings. She has no prior history of arrhythmia and did not demonstrate any during this admission. Dr. [name] would like to follow this for now. She is not on any agents that would cause this. Potassium was WNL. Dr. [name] has seen and examined the patient today and feels she is stable for discharge. I have left a message on our office's scheduling voicemail requesting a follow-up appointment, and our office will call the patient with this appointment.   Discharge Vitals: Blood pressure 110/57, pulse 76, temperature 97.7 F (36.5 C), temperature source Oral, resp. rate 18, height 5\' 7"  (1.702 m), weight 186 lb 1.1 oz (84.4 kg), SpO2 100.00%.  Labs: Lab Results  Component Value Date   WBC 5.6 02/21/2014   HGB 12.2 02/21/2014   HCT 35.2* 02/21/2014   MCV 94.1 02/21/2014   PLT 187 02/21/2014     Recent Labs Lab 02/19/14 1419 02/20/14 1035  NA 140 141  K 4.3 4.1  CL 101 102  CO2 25 25  BUN 20 20  CREATININE 0.65 0.75  CALCIUM 10.1 9.3  PROT 7.8  --   BILITOT 0.6  --   [facility]  67  --   ALT 17  --   AST 22  --   GLUCOSE 112* 101*    Recent Labs  02/19/14 2145 02/20/14 0510 02/20/14 1146  TROPONINI 0.42* <0.30 <0.30   Lab Results  Component Value Date   CHOL 162 07/09/2013   HDL 53.80 07/09/2013   LDLCALC 85 03/27/2013   TRIG 202.0* 07/09/2013    Diagnostic Studies/Procedures   Cardiac Cath 02/20/14 PROCEDURE: Left heart catheterization with selective coronary angiography, left ventriculogram. FFR RCA  INDICATIONS: NSTEMI    The risks, benefits, and details of the procedure were explained to the patient. The patient verbalized understanding and wanted to proceed. Informed written consent was obtained.  PROCEDURE TECHNIQUE: After Xylocaine anesthesia a 26F slender sheath was placed in the right radial artery with a single anterior needle wall stick. Right coronary angiography was done using a Judkins R4 guide catheter. Left coronary angiography was done using a Judkins L3.5 guide catheter. Left ventriculography was done using a pigtail catheter. A TR band was used for hemostasis.  CONTRAST: Total of 110 cc.  COMPLICATIONS: None.  HEMODYNAMICS: Aortic pressure was 103/60; LV pressure was 100/9; LVEDP 14. There was no gradient between the left ventricle and aorta.  ANGIOGRAPHIC DATA: The left main coronary artery is widely patent. There is mild ostial disease.  The left anterior descending artery is a large vessel which reaches the apex. There is an ostial 40-50% stenosis. There several medium-size diagonals which are patent.  The left circumflex artery is a large vessel proximally. In the mid vessel, there is a 50% stenosis. There is a large ramus vessel which is also widely patent. There several small to medium-sized obtuse marginals which appear patent.  The right coronary artery is a large dominant vessel. In the mid vessel, there is a 50-60% stenosis. There is an early bifurcation of the posterior lateral artery and the posterior descending artery.Marland Kitchen  LEFT VENTRICULOGRAM: Left ventricular angiogram was done in the 30 RAO projection and revealed normal left ventricular wall motion and systolic function with an estimated ejection fraction of 60 %. LVEDP was 14 mmHg.  PCI NARRATIVE: A Williams right guiding catheter was used to engage the RCA. A pressure wire was placed across the area disease in the mid right coronary artery. The resting FFR was 0.98. After IV adenosine, the FFR was 0.90.  IMPRESSIONS:  1. Widely patent  left main coronary artery. 2. Unchanged, moderate left anterior descending artery disease with patent branches. 3. Widely patent left circumflex artery and its branches with mild disease. 4. Moderate mid right coronary artery disease which was found to not be significant by FFR. 5. Normal left ventricular systolic function. LVEDP 14 mmHg. Ejection fraction 60%. RECOMMENDATION: Continue medical therapy. Likely discharge in a.m. Results were explained to the family and the patient and all questions were answered. Followup with Dr. [name].   Discharge Medications   Current Discharge Medication List    CONTINUE these medications which have NOT CHANGED   Details  acetaminophen (TYLENOL) 500 MG tablet Take 500 mg by mouth every 6 (six) hours as needed for moderate pain.    Ascorbic Acid (VITAMIN C) 500 MG tablet Take 1,000 mg by mouth 2 (two) times daily.     aspirin 81 MG tablet Take 81 mg by mouth daily.      Calcium Carbonate-Vitamin D (CALCIUM-VITAMIN D) 500-200 MG-UNIT per tablet Take 1 tablet by mouth 2 (two) times daily with a meal.      carvedilol (  COREG CR) 40 MG 24 hr capsule Take 40 mg by mouth daily.    clopidogrel (PLAVIX) 75 MG tablet Take 1 tablet (75 mg total) by mouth daily.    Associated Diagnoses: CAD (coronary artery disease)    isosorbide mononitrate (IMDUR) 60 MG 24 hr tablet Take 1 tablet (60 mg total) by mouth daily.     levothyroxine (SYNTHROID, LEVOTHROID) 88 MCG tablet Take 1 tablet (88 mcg total) by mouth daily.     lisinopril (PRINIVIL,ZESTRIL) 40 MG tablet Take 1 tablet (40 mg total) by mouth daily.     Methylsulfonylmethane 1000 MG CAPS Take 1,000 mg by mouth 2 (two) times daily.     Multiple Vitamin (MULTIVITAMIN WITH MINERALS) TABS Take 1 tablet by mouth daily.    Niacin (VITAMIN B-3 PO) Take by mouth every morning.    nitroGLYCERIN (NITROSTAT) 0.4 MG SL tablet Place 1 tablet (0.4 mg total) under the tongue every 5 (five) minutes as needed. For  chest pain     risedronate (ACTONEL) 35 MG tablet Take 35 mg by mouth every Monday. with water on empty stomach, nothing by mouth or lie down for next 30 minutes.    rosuvastatin (CRESTOR) 20 MG tablet Take 20 mg by mouth at bedtime.      STOP taking these medications     hydrochlorothiazide (MICROZIDE) 12.5 MG capsule         Disposition   The patient will be discharged in stable condition to home. Discharge Orders   Future Appointments Provider Department Dept Phone   07/08/2014 8:00 AM Rowe Clack, MD [facility] [phone]   07/08/2014 9:30 AM [facility] [address] [place] [phone]   Please arrive 15 minutes prior to your appointment time. Any medications can be taken as usual.   Future Orders Complete By Expires   Diet - low sodium heart healthy  As directed    Increase activity slowly  As directed    Scheduling Instructions:     No driving for 1 week. No lifting over 10 lbs for 2 weeks. No sexual activity for 2 weeks. Keep procedure site clean & dry. If you notice increased pain, swelling, bleeding or pus, call/return!  You may shower, but no soaking baths/hot tubs/pools for 1 week.     Follow-up Information   Follow up with Sinclair Grooms, MD. (Our office will call you for a follow-up appointment. Please call the office if you have not heard from [place] within 3 days.)    Specialty:  Cardiology   Contact information:   [address] [address] [phone]         Duration of Discharge Encounter: Greater than 30 minutes including physician and PA time.  Signed, Lisbeth Renshaw Velvia Mehrer PA-C 02/21/2014, 8:08 AM

## 2014-02-21 NOTE — Discharge Summary (Signed)
The clinical data was reviewed. The patient was interviewed and examined. Cardiac catheterization images were reviewed. She is ambulated without difficulty. Of note, her QTC is prolonged for unexplained reasons. Her laboratory data is unremarkable. We'll plan to discharge the patient today with followup in the next 7-10 days as an outpatient. This no and the plans as described is clinically accurate.

## 2014-02-21 NOTE — Progress Notes (Signed)
Reviewed the data and angiograms. She is stable this AM and if ambulates without difficulty, can be discharged this morning.

## 2014-02-26 ENCOUNTER — Encounter: Payer: Self-pay | Admitting: Interventional Cardiology

## 2014-02-26 ENCOUNTER — Ambulatory Visit (INDEPENDENT_AMBULATORY_CARE_PROVIDER_SITE_OTHER): Payer: Medicare Other | Admitting: Interventional Cardiology

## 2014-02-26 VITALS — BP 162/82 | HR 75 | Ht 67.0 in | Wt 180.0 lb

## 2014-02-26 DIAGNOSIS — I10 Essential (primary) hypertension: Secondary | ICD-10-CM

## 2014-02-26 DIAGNOSIS — R9431 Abnormal electrocardiogram [ECG] [EKG]: Secondary | ICD-10-CM

## 2014-02-26 DIAGNOSIS — R0600 Dyspnea, unspecified: Secondary | ICD-10-CM

## 2014-02-26 DIAGNOSIS — I2510 Atherosclerotic heart disease of native coronary artery without angina pectoris: Secondary | ICD-10-CM

## 2014-02-26 DIAGNOSIS — R0989 Other specified symptoms and signs involving the circulatory and respiratory systems: Secondary | ICD-10-CM

## 2014-02-26 DIAGNOSIS — R0609 Other forms of dyspnea: Secondary | ICD-10-CM

## 2014-02-26 DIAGNOSIS — E785 Hyperlipidemia, unspecified: Secondary | ICD-10-CM

## 2014-02-26 DIAGNOSIS — I214 Non-ST elevation (NSTEMI) myocardial infarction: Secondary | ICD-10-CM

## 2014-02-26 NOTE — Patient Instructions (Signed)
Your physician recommends that you continue on your current medications as directed. Please refer to the Current Medication list given to you today.  Your physician wants you to follow-up in: 6 months. You will receive a reminder letter in the mail two months in advance. If you don't receive a letter, please call our office to schedule the follow-up appointment.  

## 2014-02-26 NOTE — Progress Notes (Signed)
Patient ID: Lindsey Erickson, female   DOB: September 16, 1932, 80 y.o.   MRN: [id number]    [address], [address] [place], [place]  [address] Phone: [phone] Fax:  [phone]  Date:  02/26/2014   ID:  Lindsey Erickson, DOB December 09, 1931, MRN [id number]  PCP:  Gwendolyn Grant, MD   ASSESSMENT:  1. Neck and occipital discomfort radiating into the shoulders was trace elevated troponin x1. Cause is uncertain. At worst this could represent an episode of coronary spasm. 2. Coronary artery disease stable by recent catheterization performed in April 2014 3. Hypertension 4. Exertional dyspnea  PLAN:  1. Resume hydrochlorothiazide 12.5 mg 3 times per week 2. Prolonged visit with significant time spent hypophysitis in about the cause of her most recent episode. She had many questions that we cannot answer. All questions were answered slowly I have her that time spent last greater than 40 minutes.   SUBJECTIVE: Lindsey Erickson is a 80 y.o. female who had a recent episode of occipital and neck discomfort which associated bilateral shoulder discomfort and elevated blood pressure. She ended up in the hospital where she at one troponin that was 0.43. All other troponin levels were normal. She ended up having coronary angiography which demonstrated no change in anatomy. No significant obstructive disease is identified. Her medications were adjusted. She was discharged home after 36 hours in the hospital. Since being home she has felt well. She denies anginal quality chest pain. She has not needed to use nitroglycerin. She is pressing for a specific diagnosis and triggers for the neck and shoulder discomfort. She queried and this concerned in multiple ways.   Wt Readings from Last 3 Encounters:  02/26/14 180 lb (81.647 kg)  02/21/14 186 lb 1.1 oz (84.4 kg)  02/21/14 186 lb 1.1 oz (84.4 kg)     Past Medical History  Diagnosis Date  . Hypothyroidism   . Kidney stones   . Osteoporosis     no  fractures  . Hyperlipidemia   . Arthritis   . HYPERTENSION     a. Also has h/o intermittentl low BP.  Marland Kitchen CAD (coronary artery disease)     a. NSTEMI 2004 with thrombus in Cx and mod RCA. b. Nonischemic nuc 2008. c. mild NSTEMI 01/2013 (trop 0.42) -> cath with unchanged mod LAD & RCA disease, for continued medical therapy.  . Recurrent urinary tract infection   . H/O Hashimoto thyroiditis   . QT prolongation     a. Prior EKGs QTc 474, but on 4/1 was 524 for unclear reason.    Current Outpatient Prescriptions  Medication Sig Dispense Refill  . acetaminophen (TYLENOL) 500 MG tablet Take 500 mg by mouth every 6 (six) hours as needed for moderate pain.      . Ascorbic Acid (VITAMIN C) 500 MG tablet Take 1,000 mg by mouth 2 (two) times daily.       Marland Kitchen aspirin 81 MG tablet Take 81 mg by mouth daily.        . Calcium Carbonate-Vitamin D (CALCIUM-VITAMIN D) 500-200 MG-UNIT per tablet Take 1 tablet by mouth 2 (two) times daily with a meal.        . carvedilol (COREG CR) 40 MG 24 hr capsule Take 40 mg by mouth daily.      . clopidogrel (PLAVIX) 75 MG tablet Take 1 tablet (75 mg total) by mouth daily.  90 tablet  3  . isosorbide mononitrate (IMDUR) 60 MG 24 hr tablet Take 1  tablet (60 mg total) by mouth daily.  90 tablet  3  . levothyroxine (SYNTHROID, LEVOTHROID) 88 MCG tablet Take 1 tablet (88 mcg total) by mouth daily.  90 tablet  3  . lisinopril (PRINIVIL,ZESTRIL) 40 MG tablet Take 1 tablet (40 mg total) by mouth daily.  90 tablet  3  . Methylsulfonylmethane 1000 MG CAPS Take 1,000 mg by mouth 2 (two) times daily.       . Multiple Vitamin (MULTIVITAMIN WITH MINERALS) TABS Take 1 tablet by mouth daily.      . Niacin (VITAMIN B-3 PO) Take 1 capsule by mouth every morning.       . nitroGLYCERIN (NITROSTAT) 0.4 MG SL tablet Place 1 tablet (0.4 mg total) under the tongue every 5 (five) minutes as needed. For chest pain  90 tablet  0  . risedronate (ACTONEL) 35 MG tablet Take 35 mg by mouth every Monday.  Take with water on empty stomach and for the next 30 minutes, do not take anything by mouth or lie down.      . rosuvastatin (CRESTOR) 20 MG tablet Take 20 mg by mouth at bedtime.       No current facility-administered medications for this visit.    Allergies:    Allergies  Allergen Reactions  . 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   ALL NARCOTICS- NAUSEA AND VOMITING   . Adhesive [Tape] Itching, Swelling, Rash and Other (See Comments)    Also pain at location  . Codeine Nausea And Vomiting    Social History:  The patient  reports that she has never smoked. She does not have any smokeless tobacco history on file. She reports that she does not drink alcohol or use illicit drugs.   ROS:  Please see the history of present illness.   No syncope or medication side effect   All other systems reviewed and negative.   OBJECTIVE: VS:  BP 162/82  Pulse 75  Ht 5\' 7"  (1.702 m)  Wt 180 lb (81.647 kg)  BMI 28.19 kg/m2 Well nourished, well developed, in no acute distress, elderly HEENT: normal Neck: JVD flat. Carotid bruit absent  Cardiac:  normal S1, S2; RRR; no murmur Lungs:  clear to auscultation bilaterally, no wheezing, rhonchi or rales Abd: soft, nontender, no hepatomegaly Ext: Edema absent. Pulses 2+ Skin: warm and dry Neuro:  CNs 2-12 intact, no focal abnormalities noted  EKG:  Not repeated       Signed, Illene Labrador III, MD 02/26/2014 4:15 PM

## 2014-03-25 ENCOUNTER — Other Ambulatory Visit: Payer: Self-pay | Admitting: *Deleted

## 2014-03-25 MED ORDER — RISEDRONATE SODIUM 35 MG PO TABS: 35.0000 mg | ORAL_TABLET | ORAL | Status: DC

## 2014-05-28 ENCOUNTER — Other Ambulatory Visit: Payer: Self-pay | Admitting: Internal Medicine

## 2014-06-06 ENCOUNTER — Telehealth: Payer: Self-pay | Admitting: *Deleted

## 2014-06-06 MED ORDER — SYNTHROID 88 MCG PO TABS: ORAL_TABLET | ORAL | Status: DC

## 2014-06-06 NOTE — Telephone Encounter (Signed)
Left msg on triage stating md sent 30 day to [facility] she is needing 90 day supply. Notified pt will resend to [facility] for 90 day...Lindsey Erickson

## 2014-07-08 ENCOUNTER — Ambulatory Visit (INDEPENDENT_AMBULATORY_CARE_PROVIDER_SITE_OTHER): Payer: Medicare Other | Admitting: Internal Medicine

## 2014-07-08 ENCOUNTER — Ambulatory Visit (INDEPENDENT_AMBULATORY_CARE_PROVIDER_SITE_OTHER): Payer: Medicare Other

## 2014-07-08 ENCOUNTER — Ambulatory Visit (INDEPENDENT_AMBULATORY_CARE_PROVIDER_SITE_OTHER)
Admission: RE | Admit: 2014-07-08 | Discharge: 2014-07-08 | Disposition: A | Discharge status: Home / Self Care | Payer: Medicare Other | Source: Ambulatory Visit | Attending: Internal Medicine | Admitting: Internal Medicine

## 2014-07-08 ENCOUNTER — Encounter: Payer: Self-pay | Admitting: Internal Medicine

## 2014-07-08 VITALS — BP 138/82 | HR 73 | Temp 98.2°F | Ht 67.0 in | Wt 184.2 lb

## 2014-07-08 DIAGNOSIS — E785 Hyperlipidemia, unspecified: Secondary | ICD-10-CM

## 2014-07-08 DIAGNOSIS — M8080XS Other osteoporosis with current pathological fracture, unspecified site, sequela: Secondary | ICD-10-CM

## 2014-07-08 DIAGNOSIS — R739 Hyperglycemia, unspecified: Secondary | ICD-10-CM

## 2014-07-08 DIAGNOSIS — I2510 Atherosclerotic heart disease of native coronary artery without angina pectoris: Secondary | ICD-10-CM

## 2014-07-08 DIAGNOSIS — E039 Hypothyroidism, unspecified: Secondary | ICD-10-CM

## 2014-07-08 DIAGNOSIS — M8440XA Pathological fracture, unspecified site, initial encounter for fracture: Secondary | ICD-10-CM

## 2014-07-08 DIAGNOSIS — R7309 Other abnormal glucose: Secondary | ICD-10-CM

## 2014-07-08 DIAGNOSIS — M810 Age-related osteoporosis without current pathological fracture: Secondary | ICD-10-CM

## 2014-07-08 DIAGNOSIS — Z0000 Encounter for general adult medical examination without abnormal findings: Secondary | ICD-10-CM

## 2014-07-08 DIAGNOSIS — IMO0002 Reserved for concepts with insufficient information to code with codable children: Secondary | ICD-10-CM

## 2014-07-08 DIAGNOSIS — M8080XA Other osteoporosis with current pathological fracture, unspecified site, initial encounter for fracture: Secondary | ICD-10-CM

## 2014-07-08 LAB — BASIC METABOLIC PANEL
BUN: 28 mg/dL — ABNORMAL HIGH (ref 6–23)
CO2: 24 meq/L (ref 19–32)
Calcium: 9.4 mg/dL (ref 8.4–10.5)
Chloride: 106 mEq/L (ref 96–112)
Creatinine, Ser: 0.7 mg/dL (ref 0.4–1.2)
GFR: 93.23 mL/min (ref >60.00)
Glucose, Bld: 99 mg/dL (ref 70–99)
Potassium: 4.5 mEq/L (ref 3.5–5.1)
Sodium: 141 mEq/L (ref 135–145)

## 2014-07-08 LAB — LIPID PANEL
Cholesterol: 170 mg/dL (ref 0–200)
HDL: 54.5 mg/dL (ref >39.00)
NonHDL: 115.5
Total CHOL/HDL Ratio: 3
Triglycerides: 218 mg/dL — ABNORMAL HIGH (ref 0.0–149.0)
VLDL: 43.6 mg/dL — ABNORMAL HIGH (ref 0.0–40.0)

## 2014-07-08 LAB — HEMOGLOBIN A1C: HEMOGLOBIN A1C: 6.1 % (ref 4.6–6.5)

## 2014-07-08 LAB — TSH: TSH: 0.45 u[IU]/mL (ref 0.35–4.50)

## 2014-07-08 MED ORDER — ISOSORBIDE MONONITRATE ER 60 MG PO TB24: 60.0000 mg | ORAL_TABLET | Freq: Every day | ORAL | Status: DC

## 2014-07-08 MED ORDER — CARVEDILOL PHOSPHATE ER 40 MG PO CP24: 40.0000 mg | ORAL_CAPSULE | Freq: Every day | ORAL | Status: DC

## 2014-07-08 MED ORDER — HYDROCHLOROTHIAZIDE 12.5 MG PO CAPS: 12.5000 mg | ORAL_CAPSULE | Freq: Every day | ORAL | Status: DC

## 2014-07-08 MED ORDER — NITROGLYCERIN 0.4 MG SL SUBL: 0.4000 mg | SUBLINGUAL_TABLET | SUBLINGUAL | Status: DC | PRN

## 2014-07-08 MED ORDER — ROSUVASTATIN CALCIUM 20 MG PO TABS: 20.0000 mg | ORAL_TABLET | Freq: Every day | ORAL | Status: DC

## 2014-07-08 MED ORDER — RISEDRONATE SODIUM 35 MG PO TABS: 35.0000 mg | ORAL_TABLET | ORAL | Status: DC

## 2014-07-08 NOTE — Assessment & Plan Note (Signed)
Continue bisphos and Calcium + D fx hx accidental fall in 12/2000 (elbow R) and 05/2008 (foot L) DEXA 11/2011: -1.3 R fem, unchanged Check DEXA now to monitor tx (actonel for years when prior fosamax ineffective -prior PCP [facility])

## 2014-07-08 NOTE — Progress Notes (Signed)
Pre visit review using our clinic review tool, if applicable. No additional management support is needed unless otherwise documented below in the visit note. 

## 2014-07-08 NOTE — Progress Notes (Signed)
Subjective:    Patient ID: Lindsey Erickson, female    DOB: 03/09/1932, 80 y.o.   MRN: [id number]  HPI   Here for medicare wellness  Diet: heart healthy Physical activity: sedentary Depression/mood screen: negative Hearing: intact to whispered voice Visual acuity: grossly normal, performs annual eye exam  ADLs: capable Fall risk: none Home safety: good Cognitive evaluation: intact to orientation, naming, recall and repetition EOL planning: adv directives, full code/ I agree  I have personally reviewed and have noted 1. The patient's medical and social history 2. Their use of alcohol, tobacco or illicit drugs 3. Their current medications and supplements 4. The patient's functional ability including ADL's, fall risks, home safety risks and hearing or visual impairment. 5. Diet and physical activities 6. Evidence for depression or mood disorders  Also reviewed chronic medical issues and interval medical events  Past Medical History  Diagnosis Date  . Hypothyroidism   . Kidney stones   . Osteoporosis     no fractures  . Hyperlipidemia   . Arthritis   . HYPERTENSION     a. Also has h/o intermittentl low BP.  Marland Kitchen CAD (coronary artery disease)     a. NSTEMI 2004 with thrombus in Cx and mod RCA. b. Nonischemic nuc 2008. c. mild NSTEMI 01/2013 (trop 0.42) -> cath with unchanged mod LAD & RCA disease, for continued medical therapy.  . Recurrent urinary tract infection   . H/O Hashimoto thyroiditis   . QT prolongation     a. Prior EKGs QTc 474, but on 4/1 was 524 for unclear reason.   Family History  Problem Relation Age of Onset  . Allergies Father   . Cancer Father   . Coronary artery disease Mother 62  . CAD Sister 14   History  Substance Use Topics  . Smoking status: Never Smoker   . Smokeless tobacco: Not on file  . Alcohol Use: No    Review of Systems  Constitutional: Negative for fatigue and unexpected weight change.  Respiratory: Negative for cough,  shortness of breath and wheezing.   Cardiovascular: Negative for chest pain, palpitations and leg swelling.  Gastrointestinal: Negative for nausea, abdominal pain and diarrhea.  Neurological: Negative for dizziness, weakness, light-headedness and headaches.  Psychiatric/Behavioral: Negative for dysphoric mood. The patient is not nervous/anxious.   All other systems reviewed and are negative.      Objective:   Physical Exam  BP 138/82  Pulse 73  Temp(Src) 98.2 F (36.8 C) (Oral)  Ht 5\' 7"  (1.702 m)  Wt 184 lb 4 oz (83.575 kg)  BMI 28.85 kg/m2  SpO2 97% Wt Readings from Last 3 Encounters:  07/08/14 184 lb 4 oz (83.575 kg)  02/26/14 180 lb (81.647 kg)  02/21/14 186 lb 1.1 oz (84.4 kg)   Constitutional: She is overweight, and appears well-developed and well-nourished. No distress.  HENT: Head: Normocephalic and atraumatic. Ears: B TMs ok, no erythema or effusion; Nose: Nose normal. Mouth/Throat: Oropharynx is clear and moist. No oropharyngeal exudate.  Eyes: Conjunctivae and EOM are normal. Pupils are equal, round, and reactive to light. No scleral icterus.  Neck: Normal range of motion. Neck supple. No JVD present. No thyromegaly present.  Cardiovascular: Normal rate, regular rhythm and normal heart sounds.  No murmur heard. No BLE edema. Pulmonary/Chest: Effort normal and breath sounds normal. No respiratory distress. She has no wheezes.  GU/breast: defer to gyn Musculoskeletal: Normal range of motion, no joint effusions. No gross deformities Neurological: She is alert and  oriented to person, place, and time. No cranial nerve deficit. Coordination, balance, strength, speech and gait are normal.  Psychiatric: She has a normal mood and affect. Her behavior is normal. Judgment and thought content normal.     Lab Results  Component Value Date   WBC 5.6 02/21/2014   HGB 12.2 02/21/2014   HCT 35.2* 02/21/2014   PLT 187 02/21/2014   GLUCOSE 101* 02/20/2014   CHOL 162 07/09/2013   TRIG 202.0*  07/09/2013   HDL 53.80 07/09/2013   LDLDIRECT 84.1 07/09/2013   LDLCALC 85 03/27/2013   ALT 17 02/19/2014   AST 22 02/19/2014   NA 141 02/20/2014   K 4.1 02/20/2014   CL 102 02/20/2014   CREATININE 0.75 02/20/2014   BUN 20 02/20/2014   CO2 25 02/20/2014   TSH 1.45 10/03/2013   INR 1.01 02/20/2014   HGBA1C 6.1 10/03/2013    No results found.     Assessment & Plan:   AWV/v70.0 - Today patient counseled on age appropriate routine health concerns for screening and prevention, each reviewed and up to date or declined. Immunizations reviewed and up to date or declined. Labs ordered and reviewed. Risk factors for depression reviewed and negative. Hearing function and visual acuity are intact. ADLs screened and addressed as needed. Functional ability and level of safety reviewed and appropriate. Education, counseling and referrals performed based on assessed risks today. Patient provided with a copy of personalized plan for preventive services.  Hyperglycemia, mild and random. Check A1c -advised on diet and exercise to minimize progression to diabetes   Problem List Items Addressed This Visit   CAD (coronary artery disease)     Hx reviewed - spasm with mild NSTEMI 01/2014 (peak Trop I 0.43) Continue medical therapy - denies current angina symptoms     Relevant Medications      MICROZIDE 12.5 MG PO CAPS      nitroGLYCERIN (NITROSTAT) SL tablet      carvedilol (COREG CR) 24 hr capsule      isosorbide mononitrate (IMDUR) 24 hr tablet      rosuvastatin (CRESTOR) tablet   Other Relevant Orders      Lipid panel      Basic metabolic panel   HYPERLIPIDEMIA      occasional symptoms hypoperfusion when SBP <100 - (lightheaded) Previously tried reduce dose ACEI 01/2011 (lisinopril 40 to 20qd) which helped symptoms but BP uncontrolled, so remains on 40mg  qd in addition to beta-blocker, imdur and hctz mwf The current medical regimen is effective;  continue present plan and medications.   BP Readings from Last 3  Encounters:  07/08/14 138/82  02/26/14 162/82  02/21/14 110/57      Relevant Medications      MICROZIDE 12.5 MG PO CAPS      nitroGLYCERIN (NITROSTAT) SL tablet      carvedilol (COREG CR) 24 hr capsule      isosorbide mononitrate (IMDUR) 24 hr tablet      rosuvastatin (CRESTOR) tablet   Hypothyroid (Chronic)      Hashimotos with goiter - will continue to follow with endo for same Dwyane Dee) The current medical regimen is effective;  continue present plan and medications.   Lab Results  Component Value Date   TSH 1.45 10/03/2013      Relevant Medications      carvedilol (COREG CR) 24 hr capsule   Other Relevant Orders      TSH   Osteoporosis with fracture hx (Chronic)  Continue bisphos and Calcium + D fx hx accidental fall in 12/2000 (elbow R) and 05/2008 (foot L) DEXA 11/2011: -1.3 R fem, unchanged Check DEXA now to monitor tx (actonel for years when prior fosamax ineffective -prior PCP [facility])     Other Visit Diagnoses   Routine general medical examination at a health care facility    -  Primary    Hyperglycemia        Relevant Orders       Hemoglobin A1c

## 2014-07-08 NOTE — Assessment & Plan Note (Signed)
occasional symptoms hypoperfusion when SBP <100 - (lightheaded) Previously tried reduce dose ACEI 01/2011 (lisinopril 40 to 20qd) which helped symptoms but BP uncontrolled, so remains on 40mg  qd in addition to beta-blocker, imdur and hctz mwf The current medical regimen is effective;  continue present plan and medications.   BP Readings from Last 3 Encounters:  07/08/14 138/82  02/26/14 162/82  02/21/14 110/57

## 2014-07-08 NOTE — Assessment & Plan Note (Signed)
Hashimotos with goiter - will continue to follow with endo for same Dwyane Dee) The current medical regimen is effective;  continue present plan and medications.   Lab Results  Component Value Date   TSH 1.45 10/03/2013

## 2014-07-08 NOTE — Patient Instructions (Addendum)
It was good to see you today.  We have reviewed your prior records including labs and tests today  Health Maintenance reviewed - consider tetanus booster (every 10 years) - all other recommended immunizations and age-appropriate screenings are up-to-date.  Test(s) ordered today. Your results will be released to [place] (or called to you) after review, usually within 72hours after test completion. If any changes need to be made, you will be notified at that same time.  Medications reviewed and updated, no changes recommended at this time. Refill on medication(s) as discussed today.  Please schedule followup in 12 months for annual exam and labs, call sooner if problems.  Health Maintenance Adopting a healthy lifestyle and getting preventive care can go a long way to promote health and wellness. Talk with your health care provider about what schedule of regular examinations is right for you. This is a good chance for you to check in with your provider about disease prevention and staying healthy. In between checkups, there are plenty of things you can do on your own. Experts have done a lot of research about which lifestyle changes and preventive measures are most likely to keep you healthy. Ask your health care provider for more information. WEIGHT AND DIET  Eat a healthy diet  Be sure to include plenty of vegetables, fruits, low-fat dairy products, and lean protein.  Do not eat a lot of foods high in solid fats, added sugars, or salt.  Get regular exercise. This is one of the most important things you can do for your health.  Most adults should exercise for at least 150 minutes each week. The exercise should increase your heart rate and make you sweat (moderate-intensity exercise).  Most adults should also do strengthening exercises at least twice a week. This is in addition to the moderate-intensity exercise.  Maintain a healthy weight  Body mass index (BMI) is a measurement that can  be used to identify possible weight problems. It estimates body fat based on height and weight. Your health care provider can help determine your BMI and help you achieve or maintain a healthy weight.  For females 41 years of age and older:   A BMI below 18.5 is considered underweight.  A BMI of 18.5 to 24.9 is normal.  A BMI of 25 to 29.9 is considered overweight.  A BMI of 30 and above is considered obese.  Watch levels of cholesterol and blood lipids  You should start having your blood tested for lipids and cholesterol at 78 years of age, then have this test every 5 years.  You may need to have your cholesterol levels checked more often if:  Your lipid or cholesterol levels are high.  You are older than 78 years of age.  You are at high risk for heart disease.  CANCER SCREENING   Lung Cancer  Lung cancer screening is recommended for adults 59-39 years old who are at high risk for lung cancer because of a history of smoking.  A yearly low-dose CT scan of the lungs is recommended for people who:  Currently smoke.  Have quit within the past 15 years.  Have at least a 30-pack-year history of smoking. A pack year is smoking an average of one pack of cigarettes a day for 1 year.  Yearly screening should continue until it has been 15 years since you quit.  Yearly screening should stop if you develop a health problem that would prevent you from having lung cancer treatment.  Breast  Cancer  Practice breast self-awareness. This means understanding how your breasts normally appear and feel.  It also means doing regular breast self-exams. Let your health care provider know about any changes, no matter how small.  If you are in your 20s or 30s, you should have a clinical breast exam (CBE) by a health care provider every 1-3 years as part of a regular health exam.  If you are 37 or older, have a CBE every year. Also consider having a breast X-ray (mammogram) every year.  If  you have a family history of breast cancer, talk to your health care provider about genetic screening.  If you are at high risk for breast cancer, talk to your health care provider about having an MRI and a mammogram every year.  Breast cancer gene (BRCA) assessment is recommended for women who have family members with BRCA-related cancers. BRCA-related cancers include:  Breast.  Ovarian.  Tubal.  Peritoneal cancers.  Results of the assessment will determine the need for genetic counseling and BRCA1 and BRCA2 testing. Cervical Cancer Routine pelvic examinations to screen for cervical cancer are no longer recommended for nonpregnant women who are considered low risk for cancer of the pelvic organs (ovaries, uterus, and vagina) and who do not have symptoms. A pelvic examination may be necessary if you have symptoms including those associated with pelvic infections. Ask your health care provider if a screening pelvic exam is right for you.   The Pap test is the screening test for cervical cancer for women who are considered at risk.  If you had a hysterectomy for a problem that was not cancer or a condition that could lead to cancer, then you no longer need Pap tests.  If you are older than 65 years, and you have had normal Pap tests for the past 10 years, you no longer need to have Pap tests.  If you have had past treatment for cervical cancer or a condition that could lead to cancer, you need Pap tests and screening for cancer for at least 20 years after your treatment.  If you no longer get a Pap test, assess your risk factors if they change (such as having a new sexual partner). This can affect whether you should start being screened again.  Some women have medical problems that increase their chance of getting cervical cancer. If this is the case for you, your health care provider may recommend more frequent screening and Pap tests.  The human papillomavirus (HPV) test is another test  that may be used for cervical cancer screening. The HPV test looks for the virus that can cause cell changes in the cervix. The cells collected during the Pap test can be tested for HPV.  The HPV test can be used to screen women 80 years of age and older. Getting tested for HPV can extend the interval between normal Pap tests from three to five years.  An HPV test also should be used to screen women of any age who have unclear Pap test results.  After 78 years of age, women should have HPV testing as often as Pap tests.  Colorectal Cancer  This type of cancer can be detected and often prevented.  Routine colorectal cancer screening usually begins at 78 years of age and continues through 78 years of age.  Your health care provider may recommend screening at an earlier age if you have risk factors for colon cancer.  Your health care provider may also recommend using home  test kits to check for hidden blood in the stool.  A small camera at the end of a tube can be used to examine your colon directly (sigmoidoscopy or colonoscopy). This is done to check for the earliest forms of colorectal cancer.  Routine screening usually begins at age 58.  Direct examination of the colon should be repeated every 5-10 years through 78 years of age. However, you may need to be screened more often if early forms of precancerous polyps or small growths are found. Skin Cancer  Check your skin from head to toe regularly.  Tell your health care provider about any new moles or changes in moles, especially if there is a change in a mole's shape or color.  Also tell your health care provider if you have a mole that is larger than the size of a pencil eraser.  Always use sunscreen. Apply sunscreen liberally and repeatedly throughout the day.  Protect yourself by wearing long sleeves, pants, a wide-brimmed hat, and sunglasses whenever you are outside. HEART DISEASE, DIABETES, AND HIGH BLOOD PRESSURE   Have  your blood pressure checked at least every 1-2 years. High blood pressure causes heart disease and increases the risk of stroke.  If you are between 65 years and 52 years old, ask your health care provider if you should take aspirin to prevent strokes.  Have regular diabetes screenings. This involves taking a blood sample to check your fasting blood sugar level.  If you are at a normal weight and have a low risk for diabetes, have this test once every three years after 78 years of age.  If you are overweight and have a high risk for diabetes, consider being tested at a younger age or more often. PREVENTING INFECTION  Hepatitis B  If you have a higher risk for hepatitis B, you should be screened for this virus. You are considered at high risk for hepatitis B if:  You were born in a country where hepatitis B is common. Ask your health care provider which countries are considered high risk.  Your parents were born in a high-risk country, and you have not been immunized against hepatitis B (hepatitis B vaccine).  You have HIV or AIDS.  You use needles to inject street drugs.  You live with someone who has hepatitis B.  You have had sex with someone who has hepatitis B.  You get hemodialysis treatment.  You take certain medicines for conditions, including cancer, organ transplantation, and autoimmune conditions. Hepatitis C  Blood testing is recommended for:  Everyone born from [birth year] through 1963.  Anyone with known risk factors for hepatitis C. Sexually transmitted infections (STIs)  You should be screened for sexually transmitted infections (STIs) including gonorrhea and chlamydia if:  You are sexually active and are younger than 78 years of age.  You are older than 78 years of age and your health care provider tells you that you are at risk for this type of infection.  Your sexual activity has changed since you were last screened and you are at an increased risk for chlamydia  or gonorrhea. Ask your health care provider if you are at risk.  If you do not have HIV, but are at risk, it may be recommended that you take a prescription medicine daily to prevent HIV infection. This is called pre-exposure prophylaxis (PrEP). You are considered at risk if:  You are sexually active and do not regularly use condoms or know the HIV status of your partner(s).  You take drugs by injection.  You are sexually active with a partner who has HIV. Talk with your health care provider about whether you are at high risk of being infected with HIV. If you choose to begin PrEP, you should first be tested for HIV. You should then be tested every 3 months for as long as you are taking PrEP.  PREGNANCY   If you are premenopausal and you may become pregnant, ask your health care provider about preconception counseling.  If you may become pregnant, take 400 to 800 micrograms (mcg) of folic acid every day.  If you want to prevent pregnancy, talk to your health care provider about birth control (contraception). OSTEOPOROSIS AND MENOPAUSE   Osteoporosis is a disease in which the bones lose minerals and strength with aging. This can result in serious bone fractures. Your risk for osteoporosis can be identified using a bone density scan.  If you are 62 years of age or older, or if you are at risk for osteoporosis and fractures, ask your health care provider if you should be screened.  Ask your health care provider whether you should take a calcium or vitamin D supplement to lower your risk for osteoporosis.  Menopause may have certain physical symptoms and risks.  Hormone replacement therapy may reduce some of these symptoms and risks. Talk to your health care provider about whether hormone replacement therapy is right for you.  HOME CARE INSTRUCTIONS   Schedule regular health, dental, and eye exams.  Stay current with your immunizations.   Do not use any tobacco products including  cigarettes, chewing tobacco, or electronic cigarettes.  If you are pregnant, do not drink alcohol.  If you are breastfeeding, limit how much and how often you drink alcohol.  Limit alcohol intake to no more than 1 drink per day for nonpregnant women. One drink equals 12 ounces of beer, 5 ounces of wine, or 1 ounces of hard liquor.  Do not use street drugs.  Do not share needles.  Ask your health care provider for help if you need support or information about quitting drugs.  Tell your health care provider if you often feel depressed.  Tell your health care provider if you have ever been abused or do not feel safe at home. Document Released: 05/24/2011 Document Revised: 03/25/2014 Document Reviewed: 10/10/2013 [facility] Patient Information 2015 [place], [place]. This information is not intended to replace advice given to you by your health care provider. Make sure you discuss any questions you have with your health care provider.

## 2014-07-08 NOTE — Assessment & Plan Note (Signed)
Hx reviewed - spasm with mild NSTEMI 01/2014 (peak Trop I 0.43) Continue medical therapy - denies current angina symptoms

## 2014-07-09 LAB — LDL CHOLESTEROL, DIRECT: LDL DIRECT: 92.7 mg/dL

## 2014-08-01 ENCOUNTER — Encounter: Payer: Self-pay | Admitting: Internal Medicine

## 2014-08-01 ENCOUNTER — Other Ambulatory Visit (INDEPENDENT_AMBULATORY_CARE_PROVIDER_SITE_OTHER): Payer: Medicare Other

## 2014-08-01 ENCOUNTER — Other Ambulatory Visit: Payer: Self-pay | Admitting: Internal Medicine

## 2014-08-01 ENCOUNTER — Telehealth: Payer: Self-pay

## 2014-08-01 ENCOUNTER — Ambulatory Visit (INDEPENDENT_AMBULATORY_CARE_PROVIDER_SITE_OTHER): Payer: Medicare Other | Admitting: Internal Medicine

## 2014-08-01 VITALS — BP 132/82 | HR 90 | Temp 98.3°F | Wt 183.0 lb

## 2014-08-01 DIAGNOSIS — N390 Urinary tract infection, site not specified: Secondary | ICD-10-CM

## 2014-08-01 DIAGNOSIS — I10 Essential (primary) hypertension: Secondary | ICD-10-CM

## 2014-08-01 DIAGNOSIS — R300 Dysuria: Secondary | ICD-10-CM

## 2014-08-01 DIAGNOSIS — N3090 Cystitis, unspecified without hematuria: Secondary | ICD-10-CM

## 2014-08-01 DIAGNOSIS — I2510 Atherosclerotic heart disease of native coronary artery without angina pectoris: Secondary | ICD-10-CM

## 2014-08-01 LAB — URINALYSIS, ROUTINE W REFLEX MICROSCOPIC
BILIRUBIN URINE: NEGATIVE
Hgb urine dipstick: NEGATIVE
Ketones, ur: NEGATIVE
Nitrite: NEGATIVE
Specific Gravity, Urine: 1.015 (ref 1.000–1.030)
TOTAL PROTEIN, URINE-UPE24: NEGATIVE
URINE GLUCOSE: NEGATIVE
UROBILINOGEN UA: 2 — AB (ref 0.0–1.0)
pH: 7 (ref 5.0–8.0)

## 2014-08-01 MED ORDER — CEPHALEXIN 500 MG PO CAPS: 500.0000 mg | ORAL_CAPSULE | Freq: Four times a day (QID) | ORAL | Status: DC

## 2014-08-01 NOTE — Progress Notes (Signed)
Pre visit review using our clinic review tool, if applicable. No additional management support is needed unless otherwise documented below in the visit note. 

## 2014-08-01 NOTE — Patient Instructions (Signed)
Please take all new medication as prescribed - the antibiotic  Your specimen is also sent for culture, so please check your MyChart over the weekend to make sure the antibiotic is the correct one  Please continue all other medications as before, and refills have been done if requested.  Please keep your appointments with your specialists as you may have planned

## 2014-08-01 NOTE — Telephone Encounter (Signed)
Lab entered

## 2014-08-02 ENCOUNTER — Telehealth: Payer: Self-pay | Admitting: *Deleted

## 2014-08-02 NOTE — Telephone Encounter (Signed)
[facility] Triage Call Report Triage Record Num: [id number] Operator: Rosendo Gros DiMatteis Patient Name: Lindsey Erickson Call Date & Time: 08/01/2014 8:02:59PM Patient Phone: [phone] PCP: Cathlean Cower Patient Gender: Female PCP Fax : [phone] Patient DOB: [birth year] Practice Name: [facility] Reason for Call: Caller: [name]/Patient; PCP: Cathlean Cower (Adults only); CB#: [phone]; Call regarding Medication Issue; Medication(s): keflex; pt calling and states that she was seen today, 08/01/14, in the office and dx with a bladder infection; Cephalexin 500mg  [phone]; [facility]; she called [facility] and it was sent there; she can not wait for it to come in the mail; Triage RN reviewed in EPIC and noted Keflex 500mg  1 capsule 4 times daily #40 NR was sent to [facility]; pt states that she cancelled the Rx with [facility] earlier today; [facility] and [facility]; pt to pick up in 10 minutes; pt aware and will comply; Medication Questions Adult Guideline; Provide Health Info; caller has medication question that was answered by available resources Protocol(s) Used: Medication Questions - Adult Recommended Outcome per Protocol: Provided Health Information Reason for Outcome: Caller has medication question(s) that was answered with available resources Care Advice: ~ 09/

## 2014-08-03 DIAGNOSIS — N3090 Cystitis, unspecified without hematuria: Secondary | ICD-10-CM | POA: Insufficient documentation

## 2014-08-03 NOTE — Assessment & Plan Note (Signed)
Mild to mod, for empiric antibx course,  to f/u any worsening symptoms or concerns, for urine culture, f/u ID and sens

## 2014-08-03 NOTE — Progress Notes (Signed)
Subjective:    Patient ID: Lindsey Erickson, female    DOB: 01-Mar-1932, 80 y.o.   MRN: [id number]  HPI  Here wqith 2-3 days onset GU symtpoms of dysuria, freq but Denies urinary symptoms such as urgency, flank pain, hematuria or n/v, fever, chills, or incontinence.  Has had some nausea, and small loose stools last few days., no blood. Pt denies chest pain, increased sob or doe, wheezing, orthopnea, PND, increased LE swelling, palpitations, dizziness or syncope.  Symtoms not better with a diflucan she had leftover at home. Past Medical History  Diagnosis Date  . Hypothyroidism   . Kidney stones   . Osteoporosis     no fractures  . Hyperlipidemia   . Arthritis   . HYPERTENSION     a. Also has h/o intermittentl low BP.  [place] CAD (coronary artery disease)     a. NSTEMI 2004 with thrombus in Cx and mod RCA. b. Nonischemic nuc 2008. c. mild NSTEMI 01/2013 (trop 0.42) -> cath with unchanged mod LAD & RCA disease, for continued medical therapy.  . Recurrent urinary tract infection   . H/O Hashimoto thyroiditis   . QT prolongation     a. Prior EKGs QTc 474, but on 4/1 was 524 for unclear reason.   Past Surgical History  Procedure Laterality Date  . Appendectomy  1999  . Cholecystectomy  02/2010  . Bilateral arthroscopies of knees    . Joint replacement  01/2010    L TKR - allusio  . Cardiac catheterization      Moderate 3 vessel CAD by cath 05/2012    reports that she has never smoked. She does not have any smokeless tobacco history on file. She reports that she does not drink alcohol or use illicit drugs. family history includes Allergies in her father; CAD (age of onset: 26) in her sister; Cancer in her father; Coronary artery disease (age of onset: 64) in her mother. Allergies  Allergen Reactions  . 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   ALL NARCOTICS- NAUSEA AND VOMITING   . Adhesive [Tape]  Itching, Swelling, Rash and Other (See Comments)    Also pain at location  . Codeine Nausea And Vomiting   Current Outpatient Prescriptions on File Prior to Visit  Medication Sig Dispense Refill  . acetaminophen (TYLENOL) 500 MG tablet Take 500 mg by mouth every 6 (six) hours as needed for moderate pain.      . Ascorbic Acid (VITAMIN C) 500 MG tablet Take 1,000 mg by mouth 2 (two) times daily.       [place] aspirin 81 MG tablet Take 81 mg by mouth daily.        . Calcium Carbonate-Vitamin D (CALCIUM-VITAMIN D) 500-200 MG-UNIT per tablet Take 1 tablet by mouth 2 (two) times daily with a meal.        . carvedilol (COREG CR) 40 MG 24 hr capsule Take 1 capsule (40 mg total) by mouth daily.  90 capsule  3  . clopidogrel (PLAVIX) 75 MG tablet Take 1 tablet (75 mg total) by mouth daily.  90 tablet  3  . hydrochlorothiazide (MICROZIDE) 12.5 MG capsule Take 1 capsule (12.5 mg total) by mouth daily.  90 capsule  3  . isosorbide mononitrate (IMDUR) 60 MG 24 hr tablet Take 1 tablet (60 mg total) by mouth daily.  90 tablet  3  . lisinopril (PRINIVIL,ZESTRIL)  40 MG tablet Take 1 tablet (40 mg total) by mouth daily.  90 tablet  3  . Methylsulfonylmethane 1000 MG CAPS Take 1,000 mg by mouth 2 (two) times daily.       . metroNIDAZOLE (METROGEL) 1 % gel Apply 1 application topically daily. Apply thin coat daily to affected area      . Multiple Vitamin (MULTIVITAMIN WITH MINERALS) TABS Take 1 tablet by mouth daily.      . Niacin (VITAMIN B-3 PO) Take 1 capsule by mouth every morning.       . nitroGLYCERIN (NITROSTAT) 0.4 MG SL tablet Place 1 tablet (0.4 mg total) under the tongue every 5 (five) minutes as needed. For chest pain  4 tablet  1  . risedronate (ACTONEL) 35 MG tablet Take 1 tablet (35 mg total) by mouth every Monday. Take with water on empty stomach and for the next 30 minutes, do not take anything by mouth or lie down.  12 tablet  3  . rosuvastatin (CRESTOR) 20 MG tablet Take 1 tablet (20 mg total) by mouth  at bedtime.  90 tablet  3  . SYNTHROID 88 MCG tablet TAKE 1 TABLET DAILY  90 tablet  3   No current facility-administered medications on file prior to visit.   Review of Systems All otherwise neg per pt     Objective:   Physical Exam BP 132/82  Pulse 90  Temp(Src) 98.3 F (36.8 C) (Oral)  Wt 183 lb (83.008 kg)  SpO2 95% VS noted,  Constitutional: Pt appears well-developed, well-nourished.  HENT: Head: NCAT.  Right Ear: External ear normal.  Left Ear: External ear normal.  Eyes: . Pupils are equal, round, and reactive to light. Conjunctivae and EOM are normal Neck: Normal range of motion. Neck supple.  Cardiovascular: Normal rate and regular rhythm.   Pulmonary/Chest: Effort normal and breath sounds normal.  Abd:  Soft,  ND, + BS, mild tender low mid abd without guarding or rebound Neurological: Pt is alert. Not confused , motor grossly intact Skin: Skin is warm. No rash Psychiatric: Pt behavior is normal. No agitation.     Assessment & Plan:

## 2014-08-03 NOTE — Assessment & Plan Note (Signed)
stable overall by history and exam, recent data reviewed with pt, and pt to continue medical treatment as before,  to f/u any worsening symptoms or concerns BP Readings from Last 3 Encounters:  08/01/14 132/82  07/08/14 138/82  02/26/14 162/82

## 2014-08-04 LAB — URINE CULTURE

## 2014-08-06 ENCOUNTER — Telehealth: Payer: Self-pay | Admitting: Internal Medicine

## 2014-08-06 DIAGNOSIS — R300 Dysuria: Secondary | ICD-10-CM

## 2014-08-06 NOTE — Telephone Encounter (Signed)
Ok to stop all antibx for now, since urine cx was essentially negative  Would not advise any further antibx for now  Consider OV for rash, for now benadryl 50 mg every 6 hrs prn

## 2014-08-06 NOTE — Telephone Encounter (Signed)
Patient Information:  Caller Name: Cassidey  Phone: [phone]  Patient: Esha, Fincher  Gender: Female  DOB: Sep 26, 1932  Age: 80 Years  PCP: Gwendolyn Grant (Adults only)  Office Follow Up:  Does the office need to follow up with this patient?: Yes  Instructions For The Office: Please f/u with pt concerning medication chnage due to allergic reaction, thank you.  [facility] [address]   Symptoms  Reason For Call & Symptoms: Pt reports she was seen in the office on 08/01/14 and diagnosed with UTI and Rx Cephalexin 500mg  QID x 10days prescribed.  Pt reports she started medication and after 3 days presented with allergic reaction, hives and stopped medication.  Pt would like to know if new Rx can be called in to pharmacy.  Pt also reports allergic to [place].  [facility] [address]  Reviewed Health History In EMR: Yes  Reviewed Medications In EMR: Yes  Reviewed Allergies In EMR: Yes  Reviewed Surgeries / Procedures: Yes  Date of Onset of Symptoms: 08/01/2014  Guideline(s) Used:  No Protocol Available - Sick Adult  Disposition Per Guideline:   Discuss with PCP and Callback by Nurse Today  Reason For Disposition Reached:   Nursing judgment  Advice Given:  Call Back If:  New symptoms develop  You become worse.  Patient Will Follow Care Advice:  YES

## 2014-08-06 NOTE — Telephone Encounter (Signed)
Criteria for a significant Urinary Tract Infection (UTI) are: over 100,000 colonies of a single, not multiple  Bacteria.Only 10,000 colonies present. Antibiotic should not be started as allergic reaction risk high

## 2014-08-06 NOTE — Telephone Encounter (Signed)
Called the patient back to inform of Dr. Gwynn Burly response. The patient did not agree with response and due to her culture results feels she should be given another antibiotic.  She did state she stopped yesterday the antibiotic and rash is almost gone now.  The patient stated she felt the MD's had not carefully reviewed her culture results and concerned she was only on the 10 day antibiotic for only 3 days and that another one should be prescribed to clear up the bacteria.  The patient stated she is very disappointed on how this has been handled.  She spoke first to call a nurse, then was sent to triage, then Dr. Linna Darner and now Dr. Jenny Reichmann.  I did inform her I would inform Dr. Jenny Reichmann and Lebron Conners our Manager and that she would received a call in the morning.

## 2014-08-06 NOTE — Telephone Encounter (Signed)
Pt has called back for md recommendations. MD is out pls advise on request.../lmb

## 2014-08-06 NOTE — Telephone Encounter (Signed)
Notified pt with md response. She is requesting Dr. Jenny Reichmann to address msg. She has stop taking the cephalexin due to the hives requesting [facility]. Can not take Macrobid either....Johny Chess

## 2014-08-06 NOTE — Telephone Encounter (Signed)
[place] for repeat urine culture, but I do not feel comfortable with a different/second antibiotic given the known information, as antibiotic can have side effect such as the reported hives with the first antibx.   We will need definitive proof prior to my rx further antibx please  I can refer to urology if she desires

## 2014-08-07 NOTE — Telephone Encounter (Signed)
Dr Jenny Reichmann 's response follows standard of care . Treating with antibiotics with only  10,000 colonies can provide no benefit & puts her @ risk of rash progression or even worse C dif colitis violating established SOC. [employer]

## 2014-08-07 NOTE — Telephone Encounter (Signed)
Called the patient this morning informed of response from Dr. Jenny Reichmann and Dr. Linna Darner.  The patient did verbalize a better understanding of her results.  Stated she would come to the lab and do the urine culture.  She did appreciate the early morning call back on the situation.

## 2014-08-31 ENCOUNTER — Encounter: Payer: Self-pay | Admitting: Internal Medicine

## 2014-09-13 ENCOUNTER — Encounter: Payer: Self-pay | Admitting: Interventional Cardiology

## 2014-09-13 ENCOUNTER — Ambulatory Visit (INDEPENDENT_AMBULATORY_CARE_PROVIDER_SITE_OTHER): Payer: Medicare Other | Admitting: Interventional Cardiology

## 2014-09-13 VITALS — BP 138/82 | HR 77 | Ht 67.0 in | Wt 184.0 lb

## 2014-09-13 DIAGNOSIS — I214 Non-ST elevation (NSTEMI) myocardial infarction: Secondary | ICD-10-CM

## 2014-09-13 DIAGNOSIS — R9431 Abnormal electrocardiogram [ECG] [EKG]: Secondary | ICD-10-CM

## 2014-09-13 DIAGNOSIS — I4581 Long QT syndrome: Secondary | ICD-10-CM

## 2014-09-13 DIAGNOSIS — I2510 Atherosclerotic heart disease of native coronary artery without angina pectoris: Secondary | ICD-10-CM

## 2014-09-13 DIAGNOSIS — E785 Hyperlipidemia, unspecified: Secondary | ICD-10-CM

## 2014-09-13 MED ORDER — LISINOPRIL 40 MG PO TABS: 40.0000 mg | ORAL_TABLET | Freq: Every day | ORAL | Status: DC

## 2014-09-13 MED ORDER — SYNTHROID 88 MCG PO TABS: ORAL_TABLET | ORAL | Status: DC

## 2014-09-13 MED ORDER — CLOPIDOGREL BISULFATE 75 MG PO TABS: 75.0000 mg | ORAL_TABLET | Freq: Every day | ORAL | Status: DC

## 2014-09-13 NOTE — Patient Instructions (Addendum)
Your physician recommends that you continue on your current medications as directed. Please refer to the Current Medication list given to you today.  Your medications have been refilled today  Your physician wants you to follow-up in: 1 year with Dr.Smith You will receive a reminder letter in the mail two months in advance. If you don't receive a letter, please call our office to schedule the follow-up appointment.

## 2014-09-13 NOTE — Progress Notes (Signed)
Patient ID: Lindsey Erickson, female   DOB: Dec 06, 1931, 80 y.o.   MRN: [id number]    [address] [place],   [address] Phone: [phone] Fax:  [phone]  Date:  09/13/2014   ID:  Lindsey Erickson, DOB 08/10/1932, MRN [id number]  PCP:  Gwendolyn Grant, MD   ASSESSMENT:  1. Coronary atherosclerotic heart disease with prior non-ST elevation myocardial infarction x2 felt related to coronary spasm or transient thrombosis. She is currently stable on long-acting nitrates and dual antiplatelet therapy. 2. Hypertension, controlled 3. Hyperlipidemia on therapy 4. History of prolonged QT on EKG she time to time. This is made the addition of Ranexa a little more dicey.  PLAN:  1. Continue with aerobic activity 2. Clinical followup in one year. 3. Call if recurrent angina    SUBJECTIVE: Lindsey Erickson is a 80 y.o. female who is doing well. She is now as the typical situation. She denies nitroglycerin use. She has rare palpitations. She has rare episodes of mild chest discomfort lasting less than 60 seconds. There no exertional limitations. She has not had palpitations or syncope.   Wt Readings from Last 3 Encounters:  09/13/14 184 lb (83.462 kg)  08/01/14 183 lb (83.008 kg)  07/08/14 184 lb 4 oz (83.575 kg)     Past Medical History  Diagnosis Date  . Hypothyroidism   . Kidney stones   . Osteoporosis     no fractures  . Hyperlipidemia   . Arthritis   . HYPERTENSION     a. Also has h/o intermittentl low BP.  Marland Kitchen CAD (coronary artery disease)     a. NSTEMI 2004 with thrombus in Cx and mod RCA. b. Nonischemic nuc 2008. c. mild NSTEMI 01/2013 (trop 0.42) -> cath with unchanged mod LAD & RCA disease, for continued medical therapy.  . Recurrent urinary tract infection   . H/O Hashimoto thyroiditis   . QT prolongation     a. Prior EKGs QTc 474, but on 4/1 was 524 for unclear reason.    Current Outpatient Prescriptions  Medication Sig Dispense Refill  .  acetaminophen (TYLENOL) 500 MG tablet Take 500 mg by mouth every 6 (six) hours as needed for moderate pain.      . Ascorbic Acid (VITAMIN C) 500 MG tablet Take 1,000 mg by mouth 2 (two) times daily.       Marland Kitchen aspirin 81 MG tablet Take 81 mg by mouth daily.        . Calcium Carbonate-Vitamin D (CALCIUM-VITAMIN D) 500-200 MG-UNIT per tablet Take 1 tablet by mouth 2 (two) times daily with a meal.        . carvedilol (COREG CR) 40 MG 24 hr capsule Take 1 capsule (40 mg total) by mouth daily.  90 capsule  3  . clopidogrel (PLAVIX) 75 MG tablet Take 1 tablet (75 mg total) by mouth daily.  90 tablet  3  . hydrochlorothiazide (MICROZIDE) 12.5 MG capsule Take 1 capsule (12.5 mg total) by mouth daily.  90 capsule  3  . isosorbide mononitrate (IMDUR) 60 MG 24 hr tablet Take 1 tablet (60 mg total) by mouth daily.  90 tablet  3  . lisinopril (PRINIVIL,ZESTRIL) 40 MG tablet Take 1 tablet (40 mg total) by mouth daily.  90 tablet  3  . Methylsulfonylmethane 1000 MG CAPS Take 1,000 mg by mouth 2 (two) times daily.       . metroNIDAZOLE (METROGEL) 1 % gel Apply 1 application topically daily.  Apply thin coat daily to affected area      . Multiple Vitamin (MULTIVITAMIN WITH MINERALS) TABS Take 1 tablet by mouth daily.      . Niacin (VITAMIN B-3 PO) Take 1 capsule by mouth every morning.       . nitroGLYCERIN (NITROSTAT) 0.4 MG SL tablet Place 1 tablet (0.4 mg total) under the tongue every 5 (five) minutes as needed. For chest pain  4 tablet  1  . risedronate (ACTONEL) 35 MG tablet Take 1 tablet (35 mg total) by mouth every Monday. Take with water on empty stomach and for the next 30 minutes, do not take anything by mouth or lie down.  12 tablet  3  . rosuvastatin (CRESTOR) 20 MG tablet Take 1 tablet (20 mg total) by mouth at bedtime.  90 tablet  3  . SYNTHROID 88 MCG tablet TAKE 1 TABLET DAILY  90 tablet  3   No current facility-administered medications for this visit.    Allergies:    Allergies  Allergen Reactions   . 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   ALL NARCOTICS- NAUSEA AND VOMITING   . Adhesive [Tape] Itching, Swelling, Rash and Other (See Comments)    Also pain at location  . Codeine Nausea And Vomiting  . Cephalexin Hives    Social History:  The patient  reports that she has never smoked. She does not have any smokeless tobacco history on file. She reports that she does not drink alcohol or use illicit drugs.   ROS:  Please see the history of present illness.   She denies claudication, orthopnea, PND. She has a moderate sized water and states that it causes fullness when she lies on her back.   All other systems reviewed and negative.   OBJECTIVE: VS:  BP 138/82  Pulse 77  Ht 5\' 7"  (1.702 m)  Wt 184 lb (83.462 kg)  BMI 28.81 kg/m2  SpO2 97% Well nourished, well developed, in no acute distress, appears compatible with stated age  HEENT: normal Neck: JVD flat. Carotid bruit absent  Cardiac:  normal S1, S2; RRR; no murmur Lungs:  clear to auscultation bilaterally, no wheezing, rhonchi or rales Abd: soft, nontender, no hepatomegaly Ext: Edema absent. Pulses 2+ and symmetric  Skin: warm and dry Neuro:  CNs 2-12 intact, no focal abnormalities noted  EKG:  Not performed       Signed, Illene Labrador III, MD 09/13/2014 11:34 AM

## 2014-10-03 ENCOUNTER — Other Ambulatory Visit (INDEPENDENT_AMBULATORY_CARE_PROVIDER_SITE_OTHER): Payer: Medicare Other

## 2014-10-03 ENCOUNTER — Other Ambulatory Visit: Payer: Self-pay | Admitting: *Deleted

## 2014-10-03 DIAGNOSIS — E039 Hypothyroidism, unspecified: Secondary | ICD-10-CM

## 2014-10-03 LAB — T4, FREE: FREE T4: 1.1 ng/dL (ref 0.60–1.60)

## 2014-10-03 LAB — TSH: TSH: 1.39 u[IU]/mL (ref 0.35–4.50)

## 2014-10-07 ENCOUNTER — Encounter: Payer: Self-pay | Admitting: Endocrinology

## 2014-10-07 ENCOUNTER — Ambulatory Visit (INDEPENDENT_AMBULATORY_CARE_PROVIDER_SITE_OTHER): Payer: Medicare Other | Admitting: Endocrinology

## 2014-10-07 VITALS — BP 126/82 | HR 70 | Temp 97.9°F | Resp 14 | Ht 67.0 in | Wt 184.0 lb

## 2014-10-07 DIAGNOSIS — E042 Nontoxic multinodular goiter: Secondary | ICD-10-CM

## 2014-10-07 DIAGNOSIS — R7301 Impaired fasting glucose: Secondary | ICD-10-CM

## 2014-10-07 DIAGNOSIS — I2510 Atherosclerotic heart disease of native coronary artery without angina pectoris: Secondary | ICD-10-CM

## 2014-10-07 NOTE — Progress Notes (Signed)
Patient ID: Lindsey Erickson, female   DOB: 07/06/1932, 80 y.o.   MRN: [id number]  Reason for Appointment:  Thyroid problems, followup visit    History of Present Illness:   She initially had a multinodular goiter in 1982 She has had 3 biopsies of her thyroid nodules subsequently with the last one in 2003 which were benign Her biopsies had shown a few follicular cells and lymphocytes, not clear which nodule was biopsied She does not complain of any choking or difficulty swallowing  ? Hypothyroidism:  Most likely she has been on thyroid suppression for the goiter rather than true hypothyroidism although previous records do indicate Hashimoto thyroiditis  Complaints are reported by the patient now are none and she has no unusual fatigue or weight change    The treatments that the patient has taken include Synthroid 88 mcg for several years .          Compliance with the medical regimen has been as prescribed with taking the tablet in the morning before breakfast. TSH appears to be consistent in normal range  Lab Results  Component Value Date   FREET4 1.10 10/03/2014   FREET4 1.11 10/03/2013   TSH 1.39 10/03/2014   TSH 0.45 07/08/2014   TSH 1.45 10/03/2013     PREDIABETES:   Her last fasting glucose was 99. She had an A1c of 5.7 previously Has not had a glucose tolerance test She thinks she is watching her diet with limiting fats and sweets but is unable to lose weight Is not able to exercise because of knee pain     Lab Results  Component Value Date   HGBA1C 6.1 07/08/2014   HGBA1C 6.1 10/03/2013   HGBA1C 5.7 03/27/2013   Lab Results  Component Value Date   LDLCALC 85 03/27/2013   CREATININE 0.7 07/08/2014       Medication List       This list is accurate as of: 10/07/14  3:24 PM.  Always use your most recent med list.               acetaminophen 500 MG tablet  Commonly known as:  TYLENOL  Take 500 mg by mouth every 6 (six) hours as needed for moderate pain.      aspirin 81 MG tablet  Take 81 mg by mouth daily.     calcium-vitamin D 500-200 MG-UNIT per tablet  Take 1 tablet by mouth 2 (two) times daily with a meal.     carvedilol 40 MG 24 hr capsule  Commonly known as:  COREG CR  Take 1 capsule (40 mg total) by mouth daily.     clopidogrel 75 MG tablet  Commonly known as:  PLAVIX  Take 1 tablet (75 mg total) by mouth daily.     hydrochlorothiazide 12.5 MG capsule  Commonly known as:  MICROZIDE  Take 1 capsule (12.5 mg total) by mouth daily.     isosorbide mononitrate 60 MG 24 hr tablet  Commonly known as:  IMDUR  Take 1 tablet (60 mg total) by mouth daily.     lisinopril 40 MG tablet  Commonly known as:  PRINIVIL,ZESTRIL  Take 1 tablet (40 mg total) by mouth daily.     Methylsulfonylmethane 1000 MG Caps  Take 1,000 mg by mouth 2 (two) times daily.     metroNIDAZOLE 1 % gel  Commonly known as:  METROGEL  Apply 1 application topically daily. Apply thin coat daily to affected area     multivitamin  with minerals Tabs tablet  Take 1 tablet by mouth daily.     nitroGLYCERIN 0.4 MG SL tablet  Commonly known as:  NITROSTAT  Place 1 tablet (0.4 mg total) under the tongue every 5 (five) minutes as needed. For chest pain     risedronate 35 MG tablet  Commonly known as:  ACTONEL  Take 1 tablet (35 mg total) by mouth every Monday. Take with water on empty stomach and for the next 30 minutes, do not take anything by mouth or lie down.     rosuvastatin 20 MG tablet  Commonly known as:  CRESTOR  Take 1 tablet (20 mg total) by mouth at bedtime.     SYNTHROID 88 MCG tablet  Generic drug:  levothyroxine  TAKE 1 TABLET DAILY     VITAMIN B-3 PO  Take 1 capsule by mouth every morning.     vitamin C 500 MG tablet  Commonly known as:  ASCORBIC ACID  Take 1,000 mg by mouth 2 (two) times daily.        Allergies:  Allergies  Allergen Reactions  . Other Other (See Comments)    Frozen Blood Plasma-caused patient to crash / cardiac  arrest / CAN NEVER TAKE THIS AGAIN-PER MD'S.   ALL NARCOTICS- NAUSEA AND VOMITING   . Adhesive [Tape] Itching, Swelling, Rash and Other (See Comments)    Also pain at location  . Codeine Nausea And Vomiting  . Cephalexin Hives    Past Medical History  Diagnosis Date  . Hypothyroidism   . Kidney stones   . Osteoporosis     no fractures  . Hyperlipidemia   . Arthritis   . HYPERTENSION     a. Also has h/o intermittentl low BP.  Marland Kitchen CAD (coronary artery disease)     a. NSTEMI 2004 with thrombus in Cx and mod RCA. b. Nonischemic nuc 2008. c. mild NSTEMI 01/2013 (trop 0.42) -> cath with unchanged mod LAD & RCA disease, for continued medical therapy.  . Recurrent urinary tract infection   . H/O Hashimoto thyroiditis   . QT prolongation     a. Prior EKGs QTc 474, but on 4/1 was 524 for unclear reason.    Past Surgical History  Procedure Laterality Date  . Appendectomy  1999  . Cholecystectomy  02/2010  . Bilateral arthroscopies of knees    . Joint replacement  01/2010    L TKR - allusio  . Cardiac catheterization      Moderate 3 vessel CAD by cath 05/2012    Family History  Problem Relation Age of Onset  . Allergies Father   . Cancer Father   . Coronary artery disease Mother 2  . CAD Sister 18    Social History:  reports that she has never smoked. She does not have any smokeless tobacco history on file. She reports that she does not drink alcohol or use illicit drugs.  REVIEW Of SYSTEMS:   She apparently has osteoporosis, treated by PCP with Actonel  Currently symptomatic from her CAD, followed regularly by cardiologist  Hypertension: Is well-controlled on a 2 drug regimen   Examination:   BP 126/82 mmHg  Pulse 70  Temp(Src) 97.9 F (36.6 C)  Resp 14  Ht 5\' 7"  (1.702 m)  Wt 184 lb (83.462 kg)  BMI 28.81 kg/m2  SpO2 96%   GENERAL APPEARANCE: Alert And looks well.    NECK:  circumference is 41.5 cm     She has a goiter with a  3 times enlarged right lobe, about 3  times enlarged left lobe and Isthmus Her left lobe shows a firmer nodule and right lobe is smoother No local lymphadenopathy     NEUROLOGIC EXAM: DTRs 2+ bilaterally at biceps with normal relaxation.     Assessments   Multinodular goiter, clinically stable with no local pressure symptoms Appears to have some gradual decrease in the size of the thyroid over the last couple of years ? Coexisting hypothyroidism and Hashimoto thyroiditis. She has required lower doses in the last few years for supplement  Her TSH level has been stable and will continue the same Will check her TSH annually  IFG/prediabetes: Her fasting glucose is back to normal. She is unable to exercise much but is generally watching her diet Given chair exercises to do  Hyperlipidemia: Followed by cardiologist/PCP, LDL target should be 70  Osteoporosis: Followed by PCP  Sibel Khurana 10/07/2014, 3:24 PM

## 2014-10-31 ENCOUNTER — Encounter (HOSPITAL_COMMUNITY): Payer: Self-pay | Admitting: Interventional Cardiology

## 2014-11-01 ENCOUNTER — Other Ambulatory Visit: Payer: Self-pay | Admitting: Internal Medicine

## 2014-11-01 DIAGNOSIS — Z1231 Encounter for screening mammogram for malignant neoplasm of breast: Secondary | ICD-10-CM

## 2014-11-28 ENCOUNTER — Ambulatory Visit (HOSPITAL_COMMUNITY)
Admission: RE | Admit: 2014-11-28 | Discharge: 2014-11-28 | Disposition: A | Discharge status: Home / Self Care | Payer: Medicare Other | Source: Ambulatory Visit | Attending: Internal Medicine | Admitting: Internal Medicine

## 2014-11-28 DIAGNOSIS — Z1231 Encounter for screening mammogram for malignant neoplasm of breast: Secondary | ICD-10-CM

## 2014-12-17 ENCOUNTER — Telehealth: Payer: Self-pay | Admitting: *Deleted

## 2014-12-17 NOTE — Telephone Encounter (Signed)
Walk in pt. States she was seen by her dermatologist this morning, the MD felt a thrill in her left radial artery. Pt denies any symptoms. Pt states the dermatologist recommends for her to see her cardiologist or her PCP. The radial thrill comes and goes. Dr. [name] is aware; he states that he is not concern specially, because pt has no other symptoms. Pt is aware that if she feels numbness, or any discoloration in her left hand to let Dr [name] know. Pt verbalized understanding.

## 2014-12-20 ENCOUNTER — Emergency Department (HOSPITAL_COMMUNITY)
Admission: EM | Admit: 2014-12-20 | Discharge: 2014-12-21 | Disposition: A | Discharge status: Home / Self Care | Payer: Medicare Other | Attending: Emergency Medicine | Admitting: Emergency Medicine

## 2014-12-20 DIAGNOSIS — E785 Hyperlipidemia, unspecified: Secondary | ICD-10-CM | POA: Diagnosis not present

## 2014-12-20 DIAGNOSIS — Z7982 Long term (current) use of aspirin: Secondary | ICD-10-CM | POA: Diagnosis not present

## 2014-12-20 DIAGNOSIS — I10 Essential (primary) hypertension: Secondary | ICD-10-CM | POA: Diagnosis not present

## 2014-12-20 DIAGNOSIS — I2510 Atherosclerotic heart disease of native coronary artery without angina pectoris: Secondary | ICD-10-CM | POA: Diagnosis not present

## 2014-12-20 DIAGNOSIS — M810 Age-related osteoporosis without current pathological fracture: Secondary | ICD-10-CM | POA: Insufficient documentation

## 2014-12-20 DIAGNOSIS — Z87440 Personal history of urinary (tract) infections: Secondary | ICD-10-CM | POA: Diagnosis not present

## 2014-12-20 DIAGNOSIS — I999 Unspecified disorder of circulatory system: Secondary | ICD-10-CM

## 2014-12-20 DIAGNOSIS — Z87442 Personal history of urinary calculi: Secondary | ICD-10-CM | POA: Insufficient documentation

## 2014-12-20 DIAGNOSIS — I739 Peripheral vascular disease, unspecified: Secondary | ICD-10-CM | POA: Insufficient documentation

## 2014-12-20 DIAGNOSIS — Z79899 Other long term (current) drug therapy: Secondary | ICD-10-CM | POA: Diagnosis not present

## 2014-12-20 DIAGNOSIS — I252 Old myocardial infarction: Secondary | ICD-10-CM | POA: Diagnosis not present

## 2014-12-20 DIAGNOSIS — M1990 Unspecified osteoarthritis, unspecified site: Secondary | ICD-10-CM | POA: Insufficient documentation

## 2014-12-20 DIAGNOSIS — R0989 Other specified symptoms and signs involving the circulatory and respiratory systems: Secondary | ICD-10-CM | POA: Diagnosis present

## 2014-12-20 DIAGNOSIS — Z7901 Long term (current) use of anticoagulants: Secondary | ICD-10-CM | POA: Diagnosis not present

## 2014-12-20 DIAGNOSIS — Z9889 Other specified postprocedural states: Secondary | ICD-10-CM | POA: Insufficient documentation

## 2014-12-21 ENCOUNTER — Encounter (HOSPITAL_COMMUNITY): Payer: Self-pay | Admitting: Emergency Medicine

## 2014-12-21 DIAGNOSIS — I10 Essential (primary) hypertension: Secondary | ICD-10-CM | POA: Diagnosis not present

## 2014-12-21 DIAGNOSIS — I252 Old myocardial infarction: Secondary | ICD-10-CM | POA: Diagnosis not present

## 2014-12-21 DIAGNOSIS — I739 Peripheral vascular disease, unspecified: Secondary | ICD-10-CM | POA: Diagnosis not present

## 2014-12-21 DIAGNOSIS — E785 Hyperlipidemia, unspecified: Secondary | ICD-10-CM | POA: Diagnosis not present

## 2014-12-21 LAB — BASIC METABOLIC PANEL
ANION GAP: 8 (ref 5–15)
BUN: 29 mg/dL — AB (ref 6–23)
CALCIUM: 9.3 mg/dL (ref 8.4–10.5)
CO2: 26 mmol/L (ref 19–32)
Chloride: 105 mmol/L (ref 96–112)
Creatinine, Ser: 1.03 mg/dL (ref 0.50–1.10)
GFR, EST AFRICAN AMERICAN: 58 mL/min — AB (ref >90)
GFR, EST NON AFRICAN AMERICAN: 50 mL/min — AB (ref >90)
GLUCOSE: 141 mg/dL — AB (ref 70–99)
Potassium: 3.9 mmol/L (ref 3.5–5.1)
Sodium: 139 mmol/L (ref 135–145)

## 2014-12-21 LAB — CBC WITH DIFFERENTIAL/PLATELET
BASOS ABS: 0 10*3/uL (ref 0.0–0.1)
Basophils Relative: 1 % (ref 0–1)
EOS ABS: 0.2 10*3/uL (ref 0.0–0.7)
Eosinophils Relative: 3 % (ref 0–5)
HEMATOCRIT: 39.9 % (ref 36.0–46.0)
Hemoglobin: 13.3 g/dL (ref 12.0–15.0)
Lymphocytes Relative: 21 % (ref 12–46)
Lymphs Abs: 1.6 10*3/uL (ref 0.7–4.0)
MCH: 32.3 pg (ref 26.0–34.0)
MCHC: 33.3 g/dL (ref 30.0–36.0)
MCV: 96.8 fL (ref 78.0–100.0)
Monocytes Absolute: 0.6 10*3/uL (ref 0.1–1.0)
Monocytes Relative: 8 % (ref 3–12)
NEUTROS PCT: 67 % (ref 43–77)
Neutro Abs: 5.2 10*3/uL (ref 1.7–7.7)
Platelets: 234 10*3/uL (ref 150–400)
RBC: 4.12 MIL/uL (ref 3.87–5.11)
RDW: 12.8 % (ref 11.5–15.5)
WBC: 7.6 10*3/uL (ref 4.0–10.5)

## 2014-12-21 LAB — PROTIME-INR
INR: 0.93 (ref 0.00–1.49)
PROTHROMBIN TIME: 12.5 s (ref 11.6–15.2)

## 2014-12-21 NOTE — ED Notes (Signed)
Pt presents with dilated blood vessels to R wrist with thrill noted to R radial artery. Pt states this was initially found incidentally by her Dermatologist. Pt c/o "aching" to R elbow area, good cap refill, movement and color. Pt denies SHOB, denies CP

## 2014-12-21 NOTE — ED Provider Notes (Signed)
CSN: [id number]     Arrival date & time 12/20/14  2318 History   First MD Initiated Contact with Patient 12/21/14 [phone]     Chief Complaint  Patient presents with  . Thrill R radial artery     (Consider location/radiation/quality/duration/timing/severity/associated sxs/prior Treatment) HPI Comments: Patient is an 81 year old female with a history of kidney stones, hyperlipidemia, hypertension, CAD with NSTEMI, and recurrent urinary tract infection. She presents to the emergency department today for further evaluation of a palpable thrill to her distal right upper extremity at the site of her radial artery. Patient states that she underwent a cardiac cath in April of last year. She states that they used her right arm as the insertion site of the catheter rather than her R groin. She states that she was told that she had this change during a dermatology visit 3 days ago during a routine skin check. She has noticed that the veins on the volar aspect of her right wrist had seemed more tortuous lately. She reports a nonspecific, intermittent aching in her RUE. Patient denies any associated fever, pallor in her right upper extremity, change in sensation in her right upper extremity, other trauma or injury to the area, chest pain, or loss of consciousness. Patient is on daily Plavix.  The history is provided by the patient. No language interpreter was used.    Past Medical History  Diagnosis Date  . Hypothyroidism   . Kidney stones   . Osteoporosis     no fractures  . Hyperlipidemia   . Arthritis   . HYPERTENSION     a. Also has h/o intermittentl low BP.  Marland Kitchen CAD (coronary artery disease)     a. NSTEMI 2004 with thrombus in Cx and mod RCA. b. Nonischemic nuc 2008. c. mild NSTEMI 01/2013 (trop 0.42) -> cath with unchanged mod LAD & RCA disease, for continued medical therapy.  . Recurrent urinary tract infection   . H/O Hashimoto thyroiditis   . QT prolongation     a. Prior EKGs QTc 474, but on 4/1  was 524 for unclear reason.   Past Surgical History  Procedure Laterality Date  . Appendectomy  1999  . Cholecystectomy  02/2010  . Bilateral arthroscopies of knees    . Joint replacement  01/2010    L TKR - allusio  . Cardiac catheterization      Moderate 3 vessel CAD by cath 05/2012  . Left heart catheterization with coronary angiogram N/A 05/22/2012    Procedure: LEFT HEART CATHETERIZATION WITH CORONARY ANGIOGRAM;  Surgeon: Sinclair Grooms, MD;  Location[facility] CATH LAB;  Service: Cardiovascular;  Laterality: N/A;  . Left heart catheterization with coronary angiogram N/A 02/20/2014    Procedure: LEFT HEART CATHETERIZATION WITH CORONARY ANGIOGRAM;  Surgeon: Peter M Martinique, MD;  Location: [facility] CATH LAB;  Service: Cardiovascular;  Laterality: N/A;   Family History  Problem Relation Age of Onset  . Allergies Father   . Cancer Father   . Coronary artery disease Mother 61  . CAD Sister 35   History  Substance Use Topics  . Smoking status: Never Smoker   . Smokeless tobacco: Not on file  . Alcohol Use: No   OB History    No data available      Review of Systems  Hematological: Bruises/bleeds easily.       +palpable thrill  All other systems reviewed and are negative.   Allergies  Other; Adhesive; Codeine; and Cephalexin  Home Medications  Prior to Admission medications   Medication Sig Start Date End Date Taking? Authorizing Provider  acetaminophen (TYLENOL) 500 MG tablet Take 500 mg by mouth every 6 (six) hours as needed for moderate pain.   Yes Historical Provider, MD  Ascorbic Acid (VITAMIN C) 500 MG tablet Take 1,000 mg by mouth 2 (two) times daily.    Yes Historical Provider, MD  aspirin 81 MG tablet Take 81 mg by mouth daily.     Yes Historical Provider, MD  Calcium Carbonate-Vitamin D (CALCIUM-VITAMIN D) 500-200 MG-UNIT per tablet Take 1 tablet by mouth 2 (two) times daily with a meal.     Yes Historical Provider, MD  carvedilol (COREG CR) 40 MG 24 hr capsule Take 1  capsule (40 mg total) by mouth daily. Patient taking differently: Take 40 mg by mouth every evening.  07/08/14  Yes Rowe Clack, MD  clopidogrel (PLAVIX) 75 MG tablet Take 1 tablet (75 mg total) by mouth daily. 09/13/14  Yes Belva Crome III, MD  hydrochlorothiazide (MICROZIDE) 12.5 MG capsule Take 1 capsule (12.5 mg total) by mouth daily. Patient taking differently: Take 12.5 mg by mouth every Monday, Wednesday, and Friday.  07/08/14  Yes Rowe Clack, MD  isosorbide mononitrate (IMDUR) 60 MG 24 hr tablet Take 1 tablet (60 mg total) by mouth daily. 07/08/14 08/24/15 Yes Rowe Clack, MD  lisinopril (PRINIVIL,ZESTRIL) 40 MG tablet Take 1 tablet (40 mg total) by mouth daily. 09/13/14  Yes Belva Crome III, MD  Methylsulfonylmethane 1000 MG CAPS Take 1,000 mg by mouth 2 (two) times daily.    Yes Historical Provider, MD  Multiple Vitamin (MULTIVITAMIN WITH MINERALS) TABS Take 1 tablet by mouth daily.   Yes Historical Provider, MD  Niacin (VITAMIN B-3 PO) Take 1 capsule by mouth every morning.    Yes Historical Provider, MD  rosuvastatin (CRESTOR) 20 MG tablet Take 1 tablet (20 mg total) by mouth at bedtime. 07/08/14  Yes Rowe Clack, MD  SYNTHROID 88 MCG tablet TAKE 1 TABLET DAILY 09/13/14  Yes Belva Crome III, MD  metroNIDAZOLE (METROGEL) 1 % gel Apply 1 application topically daily. Apply thin coat daily to affected area    Historical Provider, MD  nitroGLYCERIN (NITROSTAT) 0.4 MG SL tablet Place 1 tablet (0.4 mg total) under the tongue every 5 (five) minutes as needed. For chest pain 07/08/14   Rowe Clack, MD  risedronate (ACTONEL) 35 MG tablet Take 1 tablet (35 mg total) by mouth every Monday. Take with water on empty stomach and for the next 30 minutes, do not take anything by mouth or lie down. 07/08/14   Rowe Clack, MD   BP 168/79 mmHg  Pulse 96  Temp(Src) 98.3 F (36.8 C) (Oral)  Resp 20  Ht 5\' 6"  (1.676 m)  Wt 184 lb (83.462 kg)  BMI 29.71 kg/m2   SpO2 98%   Physical Exam  Constitutional: She is oriented to person, place, and time. She appears well-developed and well-nourished. No distress.  Nontoxic/nonseptic appearing  HENT:  Head: Normocephalic and atraumatic.  Eyes: Conjunctivae and EOM are normal. No scleral icterus.  Neck: Normal range of motion.  Cardiovascular: Normal rate, regular rhythm and intact distal pulses.   Distal radial pulse 2+ in RUE. Palpable thrill appreciated with tortuous superficial veins. Capillary refill brisk in all digits of R hand.  Pulmonary/Chest: Effort normal. No respiratory distress.  Respirations even and unlabored  Musculoskeletal: Normal range of motion.  Normal range of motion of  right upper extremity including right wrist and right hand.  Neurological: She is alert and oriented to person, place, and time. She exhibits normal muscle tone. Coordination normal.  Sensation to light touch intact in bilateral upper extremities. Patient moving extremities without ataxia.  Skin: Skin is warm and dry. No rash noted. She is not diaphoretic. No erythema. No pallor.  Psychiatric: She has a normal mood and affect. Her behavior is normal.  Nursing note and vitals reviewed.   ED Course  Procedures (including critical care time) Labs Review Labs Reviewed  BASIC METABOLIC PANEL - Abnormal; Notable for the following:    Glucose, Bld 141 (*)    BUN 29 (*)    GFR calc non Af Amer 50 (*)    GFR calc Af Amer 58 (*)    All other components within normal limits  CBC WITH DIFFERENTIAL/PLATELET  PROTIME-INR    Imaging Review No results found.   EKG Interpretation None      MDM   Final diagnoses:  Vascular abnormality    81 year old female returns to the emergency department for further evaluation of a palpable thrill at her right wrist at the location of her right radial artery. This was used as an access point for a cardiac cath back in April 2015. Patient is neurovascularly intact in her right  upper extremity. No pallor or sensory changes. No pain associated with site. Patient hemodynamically stable. No indication for further emergent workup at this time; however, do believe that patient warrants follow-up with a vascular surgeon. Patient to be referred to vascular surgeon on-call. Return precautions discussed at bedside. Patient agreeable to plan with no unaddressed concerns. Patient seen also by my attending, Dr. Sharol Given, prior to discharge who is agreeable with plan. Patient discharged in good condition.   Filed Vitals:   12/20/14 2349  BP: 168/79  Pulse: 96  Temp: 98.3 F (36.8 C)  TempSrc: Oral  Resp: 20  Height: 5\' 6"  (1.676 m)  Weight: 184 lb (83.462 kg)  SpO2: 98%       Antonietta Breach, PA-C 12/21/14 0355  Kalman Drape, MD 12/21/14 0530  Kalman Drape, MD 12/21/14 [phone]

## 2014-12-23 ENCOUNTER — Telehealth: Payer: Self-pay | Admitting: *Deleted

## 2014-12-23 NOTE — Telephone Encounter (Signed)
[place] [facility] - Client [name] Gender: Female DOB: [birth year] Age: 81 Y 59 M 14 D Return Phone Number: [phone] (Primary) Address: [place]/State/Zip: [place] [address] Client [facility] - Client Client Site [place] - Night Physician Gwendolyn Grant Contact Type Call Call Type Triage / Clinical Relationship To Patient Self Return Phone Number [phone] (Primary) Chief Complaint Arm Pain Initial Comment Caller states mothers vessels are enlarged. Arm is hot to the touch. Swelling at her elbow. (RIGHT ARM) PreDisposition InappropriateToAsk Nurse Assessment Nurse: Glennon Mac, RN, Amber Date/Time (Eastern Time): 12/20/2014 10:32:56 PM Confirm and document reason for call. If symptomatic, describe symptoms. ---Caller states her vessels are enlarged in arm and it is hot to the touch. Swelling at her elbow. Her hand is red. Soreness noted in her arm and rated 4 on scale 0-10. She was seen by her dermatologist and they reported turbulent blood flow in that arm and referred her to see her PCP. Has the patient traveled out of the country within the last 30 days? ---No Does the patient require triage? ---Yes Related visit to physician within the last 2 weeks? ---Yes Does the PT have any chronic conditions? (i.e. diabetes, asthma, etc.) ---Yes List chronic conditions. ---MI HTN Hypothyroidism Guidelines Guideline Title Affirmed Question Affirmed Notes Nurse Date/Time (Eastern Time) Arm Pain [1] Age > 40 AND [2] no obvious cause AND [3] pain even when not moving the arm (Exception: pain is clearly made worse by moving arm or bending neck) Glennon Mac, RN, [employer] 12/20/2014 10:40:03 PM Disp. Time Eilene Ghazi Time) Disposition Final User 12/20/2014 10:42:09 PM Go to ED Now Yes Glennon Mac, RN, Amber PLEASE NOTE: All timestamps contained  within this report are represented as [place] Standard Time. CONFIDENTIALTY NOTICE: This fax transmission is intended only for the addressee. It contains information that is legally privileged, confidential or otherwise protected from use or disclosure. If you are not the intended recipient, you are strictly prohibited from reviewing, disclosing, copying using or disseminating any of this information or taking any action in reliance on or regarding this information. If you have received this fax in error, please notify us immediately by telephone so that we can arrange for its return to [place]. Phone: [phone], Toll-Free: [phone], Fax: [phone] Page: 2 of 2 Call Id: [id number] Caller Understands: Yes Disagree/Comply: Comply Care Advice Given Per Guideline GO TO ED NOW: You need to be seen in the Emergency Department. Go to the ER at ___________ [place] now. Drive carefully. NOTE TO TRIAGER - DRIVING: * Another adult should drive. * If immediate transportation is not available via car or taxi, then the patient should be instructed to call [id number]. BRING MEDICINES: * Please bring a list of your current medicines when you go to the Emergency Department (ER). * It is also a good idea to bring the pill bottles too. This will help the doctor to make certain you are taking the right medicines and the right dose. CARE ADVICE given per Arm Pain (Adult) guideline. After Care Instructions Given Call Event Type User Date / Time Description Referrals [facility] - ED

## 2014-12-24 ENCOUNTER — Other Ambulatory Visit: Payer: Self-pay | Admitting: *Deleted

## 2014-12-24 DIAGNOSIS — M79601 Pain in right arm: Secondary | ICD-10-CM

## 2014-12-30 ENCOUNTER — Encounter: Payer: Self-pay | Admitting: Vascular Surgery

## 2015-01-01 ENCOUNTER — Ambulatory Visit (INDEPENDENT_AMBULATORY_CARE_PROVIDER_SITE_OTHER): Payer: Medicare Other | Admitting: Vascular Surgery

## 2015-01-01 ENCOUNTER — Ambulatory Visit (HOSPITAL_COMMUNITY)
Admission: RE | Admit: 2015-01-01 | Discharge: 2015-01-01 | Disposition: A | Discharge status: Home / Self Care | Payer: Medicare Other | Source: Ambulatory Visit | Attending: Vascular Surgery | Admitting: Vascular Surgery

## 2015-01-01 ENCOUNTER — Encounter: Payer: Self-pay | Admitting: Vascular Surgery

## 2015-01-01 VITALS — BP 145/75 | HR 82 | Ht 66.0 in | Wt 183.0 lb

## 2015-01-01 DIAGNOSIS — M79601 Pain in right arm: Secondary | ICD-10-CM

## 2015-01-01 DIAGNOSIS — I770 Arteriovenous fistula, acquired: Secondary | ICD-10-CM | POA: Diagnosis present

## 2015-01-01 NOTE — Progress Notes (Signed)
[facility]  Patient name: Lindsey Erickson MRN: [id number] DOB: [birth year] Sex: female  REASON FOR CONSULT: right radial AV fistula. Referred by Dr. Sharol Given  HPI: Lindsey Erickson is a 81 y.o. female who was found to have a thrill in her right radial artery. She has undergone previous cardiac catheterization via a right radial artery approach in June 2015. She has had no pain or paresthesias in the right upper extremity. She denies any problems with swelling in the right upper extremity. This was an incidental finding.  She is right-handed.   Past Medical History  Diagnosis Date  . Hypothyroidism   . Kidney stones   . Osteoporosis     no fractures  . Hyperlipidemia   . Arthritis   . HYPERTENSION     a. Also has h/o intermittentl low BP.  Marland Kitchen CAD (coronary artery disease)     a. NSTEMI 2004 with thrombus in Cx and mod RCA. b. Nonischemic nuc 2008. c. mild NSTEMI 01/2013 (trop 0.42) -> cath with unchanged mod LAD & RCA disease, for continued medical therapy.  . Recurrent urinary tract infection   . H/O Hashimoto thyroiditis   . QT prolongation     a. Prior EKGs QTc 474, but on 4/1 was 524 for unclear reason.  . Anemia   . Myocardial infarction   . Cancer    Family History  Problem Relation Age of Onset  . Allergies Father   . Cancer Father   . Coronary artery disease Mother 23  . Heart disease Mother   . CAD Sister 22  . Cancer Sister   . AAA (abdominal aortic aneurysm) Sister   . Hypertension Sister   . Hyperlipidemia Sister   . Heart disease Sister    SOCIAL HISTORY: History  Substance Use Topics  . Smoking status: Never Smoker   . Smokeless tobacco: Not on file  . Alcohol Use: No   Allergies  Allergen Reactions  . 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   ALL NARCOTICS- NAUSEA AND VOMITING   . Adhesive [Tape] Itching, Swelling, Rash and Other  (See Comments)    Also pain at location  . Codeine Nausea And Vomiting  . Cephalexin Hives   REVIEW OF SYSTEMS: Valu.Nieves ] denotes positive finding; [  ] denotes negative finding  CARDIOVASCULAR:  [ ]  chest pain   [ ]  chest pressure   [ ]  palpitations   [ ]  orthopnea   [ ]  dyspnea on exertion   [ ]  claudication   [ ]  rest pain   [ ]  DVT   [ ]  phlebitis PULMONARY:   [ ]  productive cough   [ ]  asthma   [ ]  wheezing NEUROLOGIC:   [ ]  weakness  [ ]  paresthesias  [ ]  aphasia  [ ]  amaurosis  [ ]  dizziness HEMATOLOGIC:   [ ]  bleeding problems   [ ]  clotting disorders MUSCULOSKELETAL:  [ ]  joint pain   [ ]  joint swelling [ ]  leg swelling GASTROINTESTINAL: [ ]   blood in stool  [ ]   hematemesis GENITOURINARY:  [ ]   dysuria  [ ]   hematuria PSYCHIATRIC:  [ ]  history of major depression INTEGUMENTARY:  [ ]  rashes  [ ]  ulcers CONSTITUTIONAL:  [ ]  fever   [ ]  chills  PHYSICAL EXAM: Filed Vitals:   01/01/15 1333  BP: 145/75  Pulse:  82  Height: 5\' 6"  (1.676 m)  Weight: 183 lb (83.008 kg)  SpO2: 100%   Body mass index is 29.55 kg/(m^2). GENERAL: The patient is a well-nourished female, in no acute distress. The vital signs are documented above. CARDIOVASCULAR: There is a regular rate and rhythm. Do not detect carotid bruits. She does have a bruit and thrill in the right radial artery. There is a prominent vein at the right wrist. PULMONARY: There is good air exchange bilaterally without wheezing or rales. ABDOMEN: Soft and non-tender with normal pitched bowel sounds.  MUSCULOSKELETAL: There are no major deformities or cyanosis. NEUROLOGIC: No focal weakness or paresthesias are detected. SKIN: There are no ulcers or rashes noted. PSYCHIATRIC: The patient has a normal affect.  DATA:  I have independently interpreted her duplex scan today which shows an AV fistula between the right radial artery and the adjacent vein. There is biphasic flow in the brachial and radial artery on the right.  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 This is asymptomatic. I would not recommend surgical repair of this unless it became symptomatic. If she developed pain, paresthesias, or significant swelling in the right arm then we would consider repairing this. She knows to keep an eye on the prominent veins in her wrist that these continue to enlarge the wound could consider addressing this. I will see her back as needed.  [facility] Beeper: [phone]

## 2015-01-02 ENCOUNTER — Ambulatory Visit: Payer: Medicare Other | Admitting: Internal Medicine

## 2015-01-03 ENCOUNTER — Telehealth: Payer: Self-pay | Admitting: Interventional Cardiology

## 2015-01-03 NOTE — Telephone Encounter (Signed)
Patient explains that on 1/26 she went to her dermatologist for an annual visit.  They felt a thrill on her wrist and recommended she have this examined by her PCP.  She called PCP who recommended she have cardiologist look at this.  She came by our office that day and triage RN reviewed with [name] whom stated that he is not concerned especially, because pt has no other symptoms. Pt is aware that if she feels numbness, or any discoloration in her left hand to let Dr [name] know. Patient then went to ED on 1/29, was directed to see [facility].  She was evaluated by Dr [name] on 2/10 and has an AV fistula in the right radial artery.  Dr. [name] is not doing anything at this time as she is asymptomatic.  He will see her prn. She is upset that Dr. [name] didn't look at this the day she came in. She states that if he had she would have avoided the added cost of ED and [facility]. She would like to transfer care to Dr. [name] secondary to the above mentioned. She is aware Dr. [name] and his assist will address this next week and let her know. Forwarding to him and his assist, [name].

## 2015-01-03 NOTE — Telephone Encounter (Signed)
New message       Talk to the nurse about switching to Dr [name]

## 2015-01-03 NOTE — Telephone Encounter (Signed)
I have no problem with this. Please help her to get an appointment to see Dr. [name].

## 2015-01-07 NOTE — Telephone Encounter (Signed)
Message fwd to pcc to schedule a f/u with Dr.Berry

## 2015-01-20 ENCOUNTER — Other Ambulatory Visit (HOSPITAL_COMMUNITY): Payer: Medicare Other

## 2015-01-20 ENCOUNTER — Encounter: Payer: Medicare Other | Admitting: Surgery

## 2015-01-21 ENCOUNTER — Ambulatory Visit: Payer: Medicare Other

## 2015-02-06 ENCOUNTER — Telehealth: Payer: Self-pay | Admitting: Cardiovascular Disease

## 2015-02-10 NOTE — Telephone Encounter (Signed)
Closed encounter °

## 2015-02-17 ENCOUNTER — Other Ambulatory Visit (INDEPENDENT_AMBULATORY_CARE_PROVIDER_SITE_OTHER): Payer: Medicare Other

## 2015-02-17 ENCOUNTER — Telehealth: Payer: Self-pay | Admitting: Internal Medicine

## 2015-02-17 DIAGNOSIS — R300 Dysuria: Secondary | ICD-10-CM | POA: Diagnosis not present

## 2015-02-17 LAB — URINALYSIS, ROUTINE W REFLEX MICROSCOPIC
Bilirubin Urine: NEGATIVE
Ketones, ur: NEGATIVE
NITRITE: NEGATIVE
PH: 6 (ref 5.0–8.0)
Specific Gravity, Urine: <=1.005 — AB (ref 1.000–1.030)
TOTAL PROTEIN, URINE-UPE24: NEGATIVE
Urine Glucose: NEGATIVE
Urobilinogen, UA: 0.2 (ref 0.0–1.0)

## 2015-02-17 NOTE — Telephone Encounter (Signed)
Patient called requesting order for urinalysis. She believes she has a UTI and would like to leave a sample downstairs.

## 2015-02-17 NOTE — Telephone Encounter (Signed)
Pt informed to go to the lab.

## 2015-02-17 NOTE — Telephone Encounter (Signed)
Please go to lab - will call with results after review

## 2015-02-18 ENCOUNTER — Other Ambulatory Visit: Payer: Self-pay

## 2015-02-18 ENCOUNTER — Telehealth: Payer: Self-pay | Admitting: Internal Medicine

## 2015-02-18 MED ORDER — CIPROFLOXACIN HCL 250 MG PO TABS: 250.0000 mg | ORAL_TABLET | Freq: Two times a day (BID) | ORAL | Status: AC

## 2015-02-18 NOTE — Telephone Encounter (Signed)
[facility] on [address] is the correct pharmacy

## 2015-02-18 NOTE — Telephone Encounter (Signed)
Pt called request lab result that was done yesterday. Please call her back [phone]

## 2015-02-18 NOTE — Telephone Encounter (Signed)
UA results are back and are abnormal. Pt called and is extreme pain.   Please advise if an abx rx can be sent to pharmacy.

## 2015-02-25 ENCOUNTER — Ambulatory Visit: Payer: Medicare Other | Admitting: Cardiovascular Disease

## 2015-02-26 ENCOUNTER — Other Ambulatory Visit: Payer: Self-pay | Admitting: Dermatology

## 2015-03-10 ENCOUNTER — Encounter: Payer: Self-pay | Admitting: *Deleted

## 2015-03-18 ENCOUNTER — Ambulatory Visit (INDEPENDENT_AMBULATORY_CARE_PROVIDER_SITE_OTHER): Payer: Medicare Other | Admitting: Family Medicine

## 2015-03-18 ENCOUNTER — Encounter: Payer: Self-pay | Admitting: Family Medicine

## 2015-03-18 VITALS — BP 126/74 | HR 78 | Temp 98.3°F | Resp 18 | Ht 67.0 in | Wt 184.0 lb

## 2015-03-18 DIAGNOSIS — E042 Nontoxic multinodular goiter: Secondary | ICD-10-CM | POA: Diagnosis not present

## 2015-03-18 DIAGNOSIS — I10 Essential (primary) hypertension: Secondary | ICD-10-CM | POA: Diagnosis not present

## 2015-03-18 DIAGNOSIS — Z7689 Persons encountering health services in other specified circumstances: Secondary | ICD-10-CM

## 2015-03-18 DIAGNOSIS — R739 Hyperglycemia, unspecified: Secondary | ICD-10-CM | POA: Diagnosis not present

## 2015-03-18 DIAGNOSIS — Z7189 Other specified counseling: Secondary | ICD-10-CM | POA: Diagnosis not present

## 2015-03-18 DIAGNOSIS — E785 Hyperlipidemia, unspecified: Secondary | ICD-10-CM

## 2015-03-18 DIAGNOSIS — I2510 Atherosclerotic heart disease of native coronary artery without angina pectoris: Secondary | ICD-10-CM

## 2015-03-18 NOTE — Progress Notes (Signed)
Subjective:    Patient ID: Lindsey Erickson, female    DOB: 1932/09/03, 81 y.o.   MRN: [id number]  HPI Patient is a very pleasant 81 year old white female who is here today to establish care. She has a significant past medical history of coronary artery disease. According to her report she is suffered 2 separate myocardial infarctions. However on repeat catheterizations, the patient states that she has lesions which are not amenable to stenting. Therefore she is being managed medically. She is currently on a long-acting nitroglycerin, beta blocker, and ACE inhibitor, statin medication, and dual antiplatelet therapy. She follows up regularly with her cardiologist. Her blood pressure today is well controlled at 126/74. She denies any chest pain shortness of breath or dyspnea on exertion. She denies any myalgias or right upper quadrant pain. She also has a history of a multinodular goiter. Today on examination there is a large mass in the anterior portion of her neck. It is approximately 4 cm x 5 cm. Patient states that she has had 3 separate biopsies performed by her endocrinologist all of which is been negative. She is currently on Synthroid to help suppress the growth of a quarter. She denies any recent growth. She also has a history of hyperglycemia with borderline prediabetes with hemoglobin A1c 6.2 in August. She also has a history of osteoma anemia. However the last bone density I see was obtained last August and the worst T score she had was -1.8 in the spine. She has been on Actonel for more than 3 years. She is due for a break from bisphosphonate therapy. Her mammogram is up-to-date. She requires Pap smears. She no longer requires colonoscopies due to age. Her immunizations are up-to-date. She believes she has had Pneumovax 23 since she was 49 Past Medical History  Diagnosis Date  . Hypothyroidism   . Kidney stones   . Osteoporosis     no fractures  . Hyperlipidemia   . Arthritis   .  HYPERTENSION     a. Also has h/o intermittentl low BP.  Marland Kitchen CAD (coronary artery disease)     a. NSTEMI 2004 with thrombus in Cx and mod RCA. b. Nonischemic nuc 2008. c. mild NSTEMI 01/2013 (trop 0.42) -> cath with unchanged mod LAD & RCA disease, for continued medical therapy.  . Recurrent urinary tract infection   . H/O Hashimoto thyroiditis   . QT prolongation     a. Prior EKGs QTc 474, but on 4/1 was 524 for unclear reason.  . Anemia   . Myocardial infarction   . Cancer   . Blood transfusion without reported diagnosis 1957  . Cataract    Past Surgical History  Procedure Laterality Date  . Appendectomy  1999  . Cholecystectomy  02/2010  . Bilateral arthroscopies of knees    . Joint replacement  01/2010    L TKR - allusio  . Cardiac catheterization      Moderate 3 vessel CAD by cath 05/2012  . Left heart catheterization with coronary angiogram N/A 05/22/2012    Procedure: LEFT HEART CATHETERIZATION WITH CORONARY ANGIOGRAM;  Surgeon: Sinclair Grooms, MD;  Location: [facility] C[facility] LAB;  Service: Cardiovascular;  Laterality: N/A;  . Left heart catheterization with coronary angiogram N/A 02/20/2014    Procedure: LEFT HEART CATHETERIZATION WITH CORONARY ANGIOGRAM;  Surgeon: Peter M Martinique, MD;  Location[facility] C[facility] LAB;  Service: Cardiovascular;  Laterality: N/A;  . Tubal ligation     Current Outpatient Prescriptions on File Prior to  Visit  Medication Sig Dispense Refill  . acetaminophen (TYLENOL) 500 MG tablet Take 500 mg by mouth every 6 (six) hours as needed for moderate pain.    . Ascorbic Acid (VITAMIN C) 500 MG tablet Take 1,000 mg by mouth 2 (two) times daily.     Marland Kitchen aspirin 81 MG tablet Take 81 mg by mouth daily.      . calcium carbonate (OS-CAL) 600 MG TABS tablet Take 600 mg by mouth 2 (two) times daily with a meal.    . Calcium Carbonate-Vitamin D (CALCIUM-VITAMIN D) 500-200 MG-UNIT per tablet Take 1 tablet by mouth 2 (two) times daily with a meal.      . carvedilol (COREG CR) 40 MG 24 hr  capsule Take 1 capsule (40 mg total) by mouth every evening. 90 capsule 3  . clopidogrel (PLAVIX) 75 MG tablet Take 1 tablet (75 mg total) by mouth daily. 90 tablet 3  . isosorbide mononitrate (IMDUR) 60 MG 24 hr tablet Take 1 tablet (60 mg total) by mouth daily. 90 tablet 3  . lisinopril (PRINIVIL,ZESTRIL) 40 MG tablet Take 1 tablet (40 mg total) by mouth daily. 90 tablet 3  . Methylsulfonylmethane 1000 MG CAPS Take 1,000 mg by mouth 2 (two) times daily.     . Multiple Vitamin (MULTIVITAMIN WITH MINERALS) TABS Take 1 tablet by mouth daily.    . Niacin (VITAMIN B-3 PO) Take 1 capsule by mouth every morning.     . nitroGLYCERIN (NITROSTAT) 0.4 MG SL tablet Place 1 tablet (0.4 mg total) under the tongue every 5 (five) minutes as needed. For chest pain 4 tablet 1  . risedronate (ACTONEL) 35 MG tablet Take 1 tablet (35 mg total) by mouth every Monday. Take with water on empty stomach and for the next 30 minutes, do not take anything by mouth or lie down. 12 tablet 3  . rosuvastatin (CRESTOR) 20 MG tablet Take 1 tablet (20 mg total) by mouth at bedtime. 90 tablet 3  . SYNTHROID 88 MCG tablet TAKE 1 TABLET DAILY 90 tablet 3   No current facility-administered medications on file prior to visit.   Allergies  Allergen Reactions  . 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   ALL NARCOTICS- NAUSEA AND VOMITING   . Adhesive [Tape] Itching, Swelling, Rash and Other (See Comments)    Also pain at location  . Codeine Nausea And Vomiting  . Cephalexin Hives   History   Social History  . Marital Status: Married    Spouse Name: N/A  . Number of Children: N/A  . Years of Education: N/A   Occupational History  . Not on file.   Social History Main Topics  . Smoking status: Never Smoker   . Smokeless tobacco: Never Used  . Alcohol Use: No  . Drug Use: No  . Sexual Activity: Not Currently   Other Topics Concern   . Not on file   Social History Narrative   Married, 2 children. Retired from [employer]   Family History  Problem Relation Age of Onset  . Allergies Father   . Cancer Father   . Coronary artery disease Mother 40  . Heart disease Mother   . CAD Sister 36  . Cancer Sister   . AAA (abdominal aortic aneurysm) Sister   . Hypertension Sister   . Hyperlipidemia Sister   . Heart disease Sister  Review of Systems  All other systems reviewed and are negative.      Objective:   Physical Exam  Constitutional: She is oriented to person, place, and time. She appears well-developed and well-nourished. No distress.  Neck: Neck supple. No JVD present. No tracheal deviation present. Thyromegaly present.  Cardiovascular: Normal rate, regular rhythm and normal heart sounds.   No murmur heard. Pulmonary/Chest: Effort normal and breath sounds normal. No respiratory distress. She has no wheezes. She has no rales.  Abdominal: Soft. Bowel sounds are normal. She exhibits no distension. There is no tenderness. There is no rebound and no guarding.  Musculoskeletal: She exhibits no edema.  Lymphadenopathy:    She has no cervical adenopathy.  Neurological: She is alert and oriented to person, place, and time. She has normal reflexes. She displays normal reflexes. No cranial nerve deficit. She exhibits normal muscle tone. Coordination normal.  Skin: She is not diaphoretic.  Vitals reviewed.         Assessment & Plan:  Establishing care with new doctor, encounter for  ASCVD (arteriosclerotic cardiovascular disease) - Plan: CBC with Differential/Platelet, COMPLETE METABOLIC PANEL WITH GFR, Lipid panel  Essential hypertension  HLD (hyperlipidemia)  Multinodular goiter - Plan: TSH  Hyperglycemia - Plan: Hemoglobin A1c  Blood pressures well controlled. I have asked the patient to return tomorrow fasting so that we can obtain a fasting lipid panel, TSH, hemoglobin A1c, CBC, and a  CMP. Given the chronicity of which she has been on bisphosphonate therapy, I have recommended a holiday away from the Actonel. I would recheck a bone density test in 2 years. Her T-scores begin to decline, at that time I would resume bisphosphonate therapy. I have recommended no further colonoscopy or Pap smear. I would consider her mammogram on a yearly basis depending on her health care. Otherwise her preventative care is up-to-date.

## 2015-03-19 ENCOUNTER — Encounter: Payer: Self-pay | Admitting: Cardiovascular Disease

## 2015-03-19 ENCOUNTER — Ambulatory Visit (INDEPENDENT_AMBULATORY_CARE_PROVIDER_SITE_OTHER): Payer: Medicare Other | Admitting: Cardiovascular Disease

## 2015-03-19 VITALS — BP 128/72 | HR 76 | Ht 67.0 in | Wt 183.3 lb

## 2015-03-19 DIAGNOSIS — I10 Essential (primary) hypertension: Secondary | ICD-10-CM

## 2015-03-19 DIAGNOSIS — I2510 Atherosclerotic heart disease of native coronary artery without angina pectoris: Secondary | ICD-10-CM | POA: Diagnosis not present

## 2015-03-19 DIAGNOSIS — I2583 Coronary atherosclerosis due to lipid rich plaque: Principal | ICD-10-CM

## 2015-03-19 DIAGNOSIS — E785 Hyperlipidemia, unspecified: Secondary | ICD-10-CM

## 2015-03-19 NOTE — Assessment & Plan Note (Signed)
History of hyperlipidemia on Crestor 20 mg a day followed by her PCP 

## 2015-03-19 NOTE — Progress Notes (Signed)
03/19/2015 Lindsey Erickson   Oct 01, 1932  [id number]  Primary Physician Lindsey Fraction, MD Primary Cardiologist: Lindsey Harp MD Lindsey Erickson   HPI:  Lindsey Erickson is an 81 year old mildly overweight married Caucasian female mother of 2 children (her daughter Lindsey Erickson is accompanying her today), grandmother of 4 grandchildren who is self-referred to be established in my practice. She was previously a cardiology patient of Dr. Pernell Erickson. Her new primary care physician is Dr. Margaretmary Erickson at [facility]. Cardiovascular risk factor profile is notable for treated hypertension and hyperlipidemia. She does have a family history his sister who's had stents. Her cardiac history dates back to 2004 when she had a non-STEMI related to thrombus in the circumflex with moderate RCA disease. She had a negative nuclear stress test in 2008 and underwent cardiac catheterization one year ago on 02/20/14 by Dr. Irish Erickson after being admitted with chest pain and evolving positive enzymes. She gets infrequent nitroglycerin responsive chest pain. Her anatomy at the time of her last cath was notable for a 70% mid RCA lesion, 50-60% mid-circumflex lesion.  The RCA underwent FFR interrogation which measured 0.9 suggesting that it was not physiologically significant   Current Outpatient Prescriptions  Medication Sig Dispense Refill  . acetaminophen (TYLENOL) 500 MG tablet Take 500 mg by mouth every 6 (six) hours as needed for moderate pain.    . Ascorbic Acid (VITAMIN C) 500 MG tablet Take 1,000 mg by mouth 2 (two) times daily.     Marland Kitchen aspirin 81 MG tablet Take 81 mg by mouth daily.      . carvedilol (COREG CR) 40 MG 24 hr capsule Take 1 capsule (40 mg total) by mouth every evening. 90 capsule 3  . clopidogrel (PLAVIX) 75 MG tablet Take 1 tablet (75 mg total) by mouth daily. 90 tablet 3  . isosorbide mononitrate (IMDUR) 60 MG 24 hr tablet Take 1 tablet (60 mg total) by mouth daily. 90 tablet 3   . lisinopril (PRINIVIL,ZESTRIL) 40 MG tablet Take 1 tablet (40 mg total) by mouth daily. 90 tablet 3  . Methylsulfonylmethane 1000 MG CAPS Take 1,000 mg by mouth 2 (two) times daily.     . Multiple Vitamin (MULTIVITAMIN WITH MINERALS) TABS Take 1 tablet by mouth daily.    . Multiple Vitamins-Minerals (CENTRUM SILVER ADULT 50+ PO) Take 1 tablet by mouth daily.    . Niacin (VITAMIN B-3 PO) Take 1 capsule by mouth every morning.     . nitroGLYCERIN (NITROSTAT) 0.4 MG SL tablet Place 1 tablet (0.4 mg total) under the tongue every 5 (five) minutes as needed. For chest pain 4 tablet 1  . rosuvastatin (CRESTOR) 20 MG tablet Take 1 tablet (20 mg total) by mouth at bedtime. 90 tablet 3  . SYNTHROID 88 MCG tablet TAKE 1 TABLET DAILY 90 tablet 3   No current facility-administered medications for this visit.    Allergies  Allergen Reactions  . 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   ALL NARCOTICS- NAUSEA AND VOMITING   . Adhesive [Tape] Itching, Swelling, Rash and Other (See Comments)    Also pain at location  . Codeine Nausea And Vomiting  . Cephalexin Hives  . Macrobid [Nitrofurantoin Monohyd Macro] Nausea And Vomiting    History   Social History  . Marital Status: Married    Spouse Name: N/A  . Number of Children: N/A  .  Years of Education: N/A   Occupational History  . Not on file.   Social History Main Topics  . Smoking status: Never Smoker   . Smokeless tobacco: Never Used  . Alcohol Use: No  . Drug Use: No  . Sexual Activity: Not Currently   Other Topics Concern  . Not on file   Social History Narrative   Married, 2 children. Retired from [employer]: General: negative for chills, fever, night sweats or weight changes.  Cardiovascular: negative for chest pain, dyspnea on exertion, edema, orthopnea, palpitations, paroxysmal nocturnal dyspnea or shortness  of breath Dermatological: negative for rash Respiratory: negative for cough or wheezing Urologic: negative for hematuria Abdominal: negative for nausea, vomiting, diarrhea, bright red blood per rectum, melena, or hematemesis Neurologic: negative for visual changes, syncope, or dizziness All other systems reviewed and are otherwise negative except as noted above.    Blood pressure 128/72, pulse 76, height 5\' 7"  (1.702 m), weight 183 lb 4.8 oz (83.144 kg).  General appearance: alert and no distress Neck: no adenopathy, no carotid bruit, no JVD, supple, symmetrical, trachea midline and thyroid not enlarged, symmetric, no tenderness/mass/nodules Lungs: clear to auscultation bilaterally Heart: regular rate and rhythm, S1, S2 normal, no murmur, click, rub or gallop Extremities: extremities normal, atraumatic, no cyanosis or edema and there was a palpable thrill over the right radial artery from prior radial cath site  EKG not performed today  ASSESSMENT AND PLAN:   CAD (coronary artery disease) History of CAD status post initial cardiac catheterization back in 2004 but she had a non-STEMI to the circumflex thrombus and moderate RCA disease. She had a nonischemic Myoview in 2008. She was catheterized again 02/20/14 by Dr. Irish Erickson in the setting of a non-STEMI. He did FFR of the mid RCA lesion which was measured at 0.9 suggesting that this was not physiologically significant. She has normal LV function. She has rare nitroglycerin responsive chest pain and is on appropriate antianginal medications.   Essential hypertension History of hypertension blood pressure measured today at 128/72. She is on carvedilol all and lisinopril. Continue current meds at current dosing   Hyperlipidemia History of hyperlipidemia on Crestor 20 mg a day followed by her PCP       Lindsey Harp MD [facility], [facility] 03/19/2015 9:52 AM

## 2015-03-19 NOTE — Patient Instructions (Signed)
Dr.Berry wants you to follow-up in: ONE YEAR. You will receive a reminder letter in the mail two months in advance. If you don't receive a letter, please call our office to schedule the follow-up appointment.

## 2015-03-19 NOTE — Assessment & Plan Note (Signed)
History of CAD status post initial cardiac catheterization back in 2004 but she had a non-STEMI to the circumflex thrombus and moderate RCA disease. She had a nonischemic Myoview in 2008. She was catheterized again 02/20/14 by Dr. Irish Lack in the setting of a non-STEMI. He did FFR of the mid RCA lesion which was measured at 0.9 suggesting that this was not physiologically significant. She has normal LV function. She has rare nitroglycerin responsive chest pain and is on appropriate antianginal medications.

## 2015-03-19 NOTE — Assessment & Plan Note (Signed)
History of hypertension blood pressure measured today at 128/72. She is on carvedilol all and lisinopril. Continue current meds at current dosing

## 2015-03-24 ENCOUNTER — Other Ambulatory Visit: Payer: Medicare Other

## 2015-03-24 LAB — CBC WITH DIFFERENTIAL/PLATELET
Basophils Absolute: 0.1 10*3/uL (ref 0.0–0.1)
Basophils Relative: 1 % (ref 0–1)
EOS ABS: 0.3 10*3/uL (ref 0.0–0.7)
EOS PCT: 5 % (ref 0–5)
HEMATOCRIT: 40.9 % (ref 36.0–46.0)
Hemoglobin: 13.5 g/dL (ref 12.0–15.0)
LYMPHS ABS: 1.5 10*3/uL (ref 0.7–4.0)
Lymphocytes Relative: 25 % (ref 12–46)
MCH: 31.9 pg (ref 26.0–34.0)
MCHC: 33 g/dL (ref 30.0–36.0)
MCV: 96.7 fL (ref 78.0–100.0)
MPV: 9.6 fL (ref 8.6–12.4)
Monocytes Absolute: 0.5 10*3/uL (ref 0.1–1.0)
Monocytes Relative: 8 % (ref 3–12)
Neutro Abs: 3.5 10*3/uL (ref 1.7–7.7)
Neutrophils Relative %: 61 % (ref 43–77)
PLATELETS: 235 10*3/uL (ref 150–400)
RBC: 4.23 MIL/uL (ref 3.87–5.11)
RDW: 13.3 % (ref 11.5–15.5)
WBC: 5.8 10*3/uL (ref 4.0–10.5)

## 2015-03-24 LAB — LIPID PANEL
CHOL/HDL RATIO: 3.1 ratio
Cholesterol: 174 mg/dL (ref 0–200)
HDL: 56 mg/dL (ref >=46)
LDL Cholesterol: 70 mg/dL (ref 0–99)
TRIGLYCERIDES: 238 mg/dL — AB (ref <150)
VLDL: 48 mg/dL — ABNORMAL HIGH (ref 0–40)

## 2015-03-24 LAB — COMPLETE METABOLIC PANEL WITH GFR
ALBUMIN: 4 g/dL (ref 3.5–5.2)
ALT: 14 U/L (ref 0–35)
AST: 18 U/L (ref 0–37)
Alkaline Phosphatase: 61 U/L (ref 39–117)
BUN: 19 mg/dL (ref 6–23)
CALCIUM: 9.4 mg/dL (ref 8.4–10.5)
CO2: 28 mEq/L (ref 19–32)
CREATININE: 0.72 mg/dL (ref 0.50–1.10)
Chloride: 103 mEq/L (ref 96–112)
GFR, EST NON AFRICAN AMERICAN: 79 mL/min
Glucose, Bld: 104 mg/dL — ABNORMAL HIGH (ref 70–99)
POTASSIUM: 4.9 meq/L (ref 3.5–5.3)
Sodium: 141 mEq/L (ref 135–145)
Total Bilirubin: 0.4 mg/dL (ref 0.2–1.2)
Total Protein: 6.9 g/dL (ref 6.0–8.3)

## 2015-03-24 LAB — HEMOGLOBIN A1C
Hgb A1c MFr Bld: 5.9 % — ABNORMAL HIGH (ref <5.7)
Mean Plasma Glucose: 123 mg/dL — ABNORMAL HIGH (ref <117)

## 2015-03-24 LAB — TSH: TSH: 0.531 u[IU]/mL (ref 0.350–4.500)

## 2015-03-26 ENCOUNTER — Encounter: Payer: Self-pay | Admitting: Family Medicine

## 2015-04-16 ENCOUNTER — Encounter: Payer: Self-pay | Admitting: Family Medicine

## 2015-04-17 ENCOUNTER — Encounter: Payer: Self-pay | Admitting: Internal Medicine

## 2015-04-17 DIAGNOSIS — C4492 Squamous cell carcinoma of skin, unspecified: Secondary | ICD-10-CM

## 2015-04-17 HISTORY — DX: Squamous cell carcinoma of skin, unspecified: C44.92

## 2015-05-03 ENCOUNTER — Other Ambulatory Visit: Payer: Self-pay | Admitting: Internal Medicine

## 2015-05-29 ENCOUNTER — Encounter: Payer: Self-pay | Admitting: Family Medicine

## 2015-05-29 ENCOUNTER — Ambulatory Visit (INDEPENDENT_AMBULATORY_CARE_PROVIDER_SITE_OTHER): Payer: Medicare Other | Admitting: Family Medicine

## 2015-05-29 VITALS — BP 106/64 | HR 78 | Temp 98.4°F | Resp 18 | Wt 185.0 lb

## 2015-05-29 DIAGNOSIS — J019 Acute sinusitis, unspecified: Secondary | ICD-10-CM

## 2015-05-29 DIAGNOSIS — I2510 Atherosclerotic heart disease of native coronary artery without angina pectoris: Secondary | ICD-10-CM

## 2015-05-29 MED ORDER — FLUCONAZOLE 150 MG PO TABS: 150.0000 mg | ORAL_TABLET | Freq: Once | ORAL | Status: DC

## 2015-05-29 MED ORDER — AMOXICILLIN-POT CLAVULANATE 875-125 MG PO TABS: 1.0000 | ORAL_TABLET | Freq: Two times a day (BID) | ORAL | Status: DC

## 2015-05-29 NOTE — Progress Notes (Signed)
Subjective:    Patient ID: Lindsey Erickson, female    DOB: 1931-12-17, 81 y.o.   MRN: [id number]  HPI  Has been battling allergies this summer.  However 2 days ago this turned into severe pain in her right frontal and right maxillary sinus. She is having pain in her teeth and her jaw on the side as well. She is having copious rhinorrhea and postnasal drip causing a sore scratchy throat. She is having subjective fevers and headaches Past Medical History  Diagnosis Date  . Hypothyroidism   . Kidney stones   . Osteoporosis     no fractures  . Hyperlipidemia   . Arthritis   . HYPERTENSION     a. Also has h/o intermittentl low BP.  Marland Kitchen CAD (coronary artery disease)     a. NSTEMI 2004 with thrombus in Cx and mod RCA. b. Nonischemic nuc 2008. c. mild NSTEMI 01/2013 (trop 0.42) -> cath with unchanged mod LAD & RCA disease, for continued medical therapy.  . Recurrent urinary tract infection   . H/O Hashimoto thyroiditis   . QT prolongation     a. Prior EKGs QTc 474, but on 4/1 was 524 for unclear reason.  . Anemia   . Myocardial infarction   . Cancer   . Blood transfusion without reported diagnosis 1957  . Cataract   . Squamous cell carcinoma of skin 04/17/2015   Past Surgical History  Procedure Laterality Date  . Appendectomy  1999  . Cholecystectomy  02/2010  . Bilateral arthroscopies of knees    . Joint replacement  01/2010    L TKR - allusio  . Cardiac catheterization      Moderate 3 vessel CAD by cath 05/2012  . Left heart catheterization with coronary angiogram N/A 05/22/2012    Procedure: LEFT HEART CATHETERIZATION WITH CORONARY ANGIOGRAM;  Surgeon: Sinclair Grooms, MD;  Location: [facility] CATH LAB;  Service: Cardiovascular;  Laterality: N/A;  . Left heart catheterization with coronary angiogram N/A 02/20/2014    Procedure: LEFT HEART CATHETERIZATION WITH CORONARY ANGIOGRAM;  Surgeon: Peter M Martinique, MD;  Location: [facility] CATH LAB;  Service: Cardiovascular;  Laterality: N/A;  . Tubal ligation      Current Outpatient Prescriptions on File Prior to Visit  Medication Sig Dispense Refill  . acetaminophen (TYLENOL) 500 MG tablet Take 500 mg by mouth every 6 (six) hours as needed for moderate pain.    . Ascorbic Acid (VITAMIN C) 500 MG tablet Take 1,000 mg by mouth 2 (two) times daily.     Marland Kitchen aspirin 81 MG tablet Take 81 mg by mouth daily.      . carvedilol (COREG CR) 40 MG 24 hr capsule Take 1 capsule (40 mg total) by mouth every evening. 90 capsule 3  . clopidogrel (PLAVIX) 75 MG tablet Take 1 tablet (75 mg total) by mouth daily. 90 tablet 3  . isosorbide mononitrate (IMDUR) 60 MG 24 hr tablet Take 1 tablet (60 mg total) by mouth daily. 90 tablet 3  . lisinopril (PRINIVIL,ZESTRIL) 40 MG tablet Take 1 tablet (40 mg total) by mouth daily. 90 tablet 3  . Methylsulfonylmethane 1000 MG CAPS Take 1,000 mg by mouth 2 (two) times daily.     . Multiple Vitamin (MULTIVITAMIN WITH MINERALS) TABS Take 1 tablet by mouth daily.    . Multiple Vitamins-Minerals (CENTRUM SILVER ADULT 50+ PO) Take 1 tablet by mouth daily.    . Niacin (VITAMIN B-3 PO) Take 1 capsule by mouth every morning.     Marland Kitchen  nitroGLYCERIN (NITROSTAT) 0.4 MG SL tablet Place 1 tablet (0.4 mg total) under the tongue every 5 (five) minutes as needed. For chest pain 4 tablet 1  . risedronate (ACTONEL) 35 MG tablet Take 1 tablet every Monday with water on an empty stomach and for the next 30 minutes do not take anything by mouth or lie down. 12 tablet 2  . rosuvastatin (CRESTOR) 20 MG tablet Take 1 tablet (20 mg total) by mouth at bedtime. 90 tablet 3  . SYNTHROID 88 MCG tablet TAKE 1 TABLET DAILY 90 tablet 3   No current facility-administered medications on file prior to visit.   Allergies  Allergen Reactions  . 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   ALL NARCOTICS- NAUSEA AND VOMITING   . Adhesive [Tape] Itching, Swelling, Rash and Other (See  Comments)    Also pain at location  . Codeine Nausea And Vomiting  . Cephalexin Hives  . Macrobid [Nitrofurantoin Monohyd Macro] Nausea And Vomiting   History   Social History  . Marital Status: Married    Spouse Name: N/A  . Number of Children: N/A  . Years of Education: N/A   Occupational History  . Not on file.   Social History Main Topics  . Smoking status: Never Smoker   . Smokeless tobacco: Never Used  . Alcohol Use: No  . Drug Use: No  . Sexual Activity: Not Currently   Other Topics Concern  . Not on file   Social History Narrative   Married, 2 children. Retired from [employer] other systems reviewed and are negative.      Objective:   Physical Exam  Constitutional: She appears well-developed and well-nourished. No distress.  HENT:  Right Ear: Tympanic membrane, external ear and ear canal normal.  Left Ear: External ear and ear canal normal. A middle ear effusion is present.  Nose: Mucosal edema and rhinorrhea present. Right sinus exhibits maxillary sinus tenderness and frontal sinus tenderness.  Mouth/Throat: Oropharynx is clear and moist. No oropharyngeal exudate.  Eyes: Conjunctivae are normal. Pupils are equal, round, and reactive to light.  Cardiovascular: Normal rate, regular rhythm and normal heart sounds.   No murmur heard. Pulmonary/Chest: Effort normal and breath sounds normal. No respiratory distress. She has no wheezes. She has no rales.  Abdominal: Soft. Bowel sounds are normal. She exhibits no distension. There is no tenderness. There is no rebound and no guarding.  Lymphadenopathy:    She has no cervical adenopathy.  Skin: She is not diaphoretic.  Vitals reviewed.         Assessment & Plan:  Acute rhinosinusitis - Plan: amoxicillin-clavulanate (AUGMENTIN) 875-125 MG per tablet  Patient has developed secondary sinusitis from her allergies or possibly acute rhinosinusitis. I'll treat her with Augmentin  875 mg by mouth twice a day for 10 days. Also gave her a prescription for Diflucan in case she develops a secondary yeast infection.

## 2015-06-05 ENCOUNTER — Telehealth: Payer: Self-pay | Admitting: *Deleted

## 2015-06-05 NOTE — Telephone Encounter (Signed)
Cleared for cataract removal

## 2015-06-05 NOTE — Telephone Encounter (Signed)
Requesting surgical clearance:   1. Type of surgery: cataract extraction with mild sedation  2. Surgeon: [facility]  3. Surgical date: 06/09/15  4. Medications that need to be help: n/a  5. CAD: Yes      Expand All Collapse All   History of CAD status post initial cardiac catheterization back in 2004 but she had a non-STEMI to the circumflex thrombus and moderate RCA disease. She had a nonischemic Myoview in 2008. She was catheterized again 02/20/14 by Dr. Irish Lack in the setting of a non-STEMI. He did FFR of the mid RCA lesion which was measured at 0.9 suggesting that this was not physiologically significant. She has normal LV function. She has rare nitroglycerin responsive chest pain and is on appropriate antianginal medications.        6. I will defer to: Dr Quay Burow

## 2015-06-06 NOTE — Telephone Encounter (Signed)
Encounter routed to [facility] Pre-Anesthesia Department [phone]

## 2015-07-07 ENCOUNTER — Ambulatory Visit (INDEPENDENT_AMBULATORY_CARE_PROVIDER_SITE_OTHER): Payer: Medicare Other | Admitting: Family Medicine

## 2015-07-07 ENCOUNTER — Encounter: Payer: Self-pay | Admitting: Family Medicine

## 2015-07-07 VITALS — BP 128/74 | HR 78 | Temp 98.9°F | Resp 76 | Wt 187.0 lb

## 2015-07-07 DIAGNOSIS — R300 Dysuria: Secondary | ICD-10-CM | POA: Diagnosis not present

## 2015-07-07 DIAGNOSIS — I2510 Atherosclerotic heart disease of native coronary artery without angina pectoris: Secondary | ICD-10-CM | POA: Diagnosis not present

## 2015-07-07 LAB — URINALYSIS, ROUTINE W REFLEX MICROSCOPIC
BILIRUBIN URINE: NEGATIVE
Glucose, UA: NEGATIVE
Hgb urine dipstick: NEGATIVE
Nitrite: NEGATIVE
PH: 6.5 (ref 5.0–8.0)
SPECIFIC GRAVITY, URINE: 1.02 (ref 1.001–1.035)

## 2015-07-07 LAB — URINALYSIS, MICROSCOPIC ONLY
CRYSTALS: NONE SEEN [HPF]
Casts: NONE SEEN [LPF]
Yeast: NONE SEEN [HPF]

## 2015-07-07 MED ORDER — CIPROFLOXACIN HCL 500 MG PO TABS: 500.0000 mg | ORAL_TABLET | Freq: Two times a day (BID) | ORAL | Status: DC

## 2015-07-07 NOTE — Addendum Note (Signed)
Addended by: Haskel Khan A on: 07/07/2015 11:42 AM   Modules accepted: Orders

## 2015-07-07 NOTE — Progress Notes (Signed)
Subjective:    Patient ID: Lindsey Erickson, female    DOB: Oct 21, 1932, 81 y.o.   MRN: [id number]  HPI Patient reports 3-4 days of dysuria, urgency, frequency, and hesitancy. Urinalysis today is significant for 3+ leukocyte esterase. Past Medical History  Diagnosis Date  . Hypothyroidism   . Kidney stones   . Osteoporosis     no fractures  . Hyperlipidemia   . Arthritis   . HYPERTENSION     a. Also has h/o intermittentl low BP.  Marland Kitchen CAD (coronary artery disease)     a. NSTEMI 2004 with thrombus in Cx and mod RCA. b. Nonischemic nuc 2008. c. mild NSTEMI 01/2013 (trop 0.42) -> cath with unchanged mod LAD & RCA disease, for continued medical therapy.  . Recurrent urinary tract infection   . H/O Hashimoto thyroiditis   . QT prolongation     a. Prior EKGs QTc 474, but on 4/1 was 524 for unclear reason.  . Anemia   . Myocardial infarction   . Cancer   . Blood transfusion without reported diagnosis 1957  . Cataract   . Squamous cell carcinoma of skin 04/17/2015   Past Surgical History  Procedure Laterality Date  . Appendectomy  1999  . Cholecystectomy  02/2010  . Bilateral arthroscopies of knees    . Joint replacement  01/2010    L TKR - allusio  . Cardiac catheterization      Moderate 3 vessel CAD by cath 05/2012  . Left heart catheterization with coronary angiogram N/A 05/22/2012    Procedure: LEFT HEART CATHETERIZATION WITH CORONARY ANGIOGRAM;  Surgeon: Sinclair Grooms, MD;  Location: [facility] CATH LAB;  Service: Cardiovascular;  Laterality: N/A;  . Left heart catheterization with coronary angiogram N/A 02/20/2014    Procedure: LEFT HEART CATHETERIZATION WITH CORONARY ANGIOGRAM;  Surgeon: Peter M Martinique, MD;  Location: [facility] CATH LAB;  Service: Cardiovascular;  Laterality: N/A;  . Tubal ligation     Current Outpatient Prescriptions on File Prior to Visit  Medication Sig Dispense Refill  . acetaminophen (TYLENOL) 500 MG tablet Take 500 mg by mouth every 6 (six) hours as needed for moderate  pain.    . Ascorbic Acid (VITAMIN C) 500 MG tablet Take 1,000 mg by mouth 2 (two) times daily.     Marland Kitchen aspirin 81 MG tablet Take 81 mg by mouth daily.      . carvedilol (COREG CR) 40 MG 24 hr capsule Take 1 capsule (40 mg total) by mouth every evening. 90 capsule 3  . clindamycin (CLINDAGEL) 1 % gel Apply 1 application topically daily.    . clopidogrel (PLAVIX) 75 MG tablet Take 1 tablet (75 mg total) by mouth daily. 90 tablet 3  . isosorbide mononitrate (IMDUR) 60 MG 24 hr tablet Take 1 tablet (60 mg total) by mouth daily. 90 tablet 3  . lisinopril (PRINIVIL,ZESTRIL) 40 MG tablet Take 1 tablet (40 mg total) by mouth daily. 90 tablet 3  . Methylsulfonylmethane 1000 MG CAPS Take 1,000 mg by mouth 2 (two) times daily.     . Multiple Vitamins-Minerals (CENTRUM SILVER ADULT 50+ PO) Take 1 tablet by mouth daily.    . Niacin (VITAMIN B-3 PO) Take 1 capsule by mouth every morning.     . nitroGLYCERIN (NITROSTAT) 0.4 MG SL tablet Place 1 tablet (0.4 mg total) under the tongue every 5 (five) minutes as needed. For chest pain 4 tablet 1  . rosuvastatin (CRESTOR) 20 MG tablet Take 1 tablet (20 mg  total) by mouth at bedtime. 90 tablet 3  . SYNTHROID 88 MCG tablet TAKE 1 TABLET DAILY 90 tablet 3   No current facility-administered medications on file prior to visit.   Allergies  Allergen Reactions  . 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   ALL NARCOTICS- NAUSEA AND VOMITING   . Adhesive [Tape] Itching, Swelling, Rash and Other (See Comments)    Also pain at location  . Codeine Nausea And Vomiting  . Cephalexin Hives  . Macrobid [Nitrofurantoin Monohyd Macro] Nausea And Vomiting   Social History   Social History  . Marital Status: Married    Spouse Name: N/A  . Number of Children: N/A  . Years of Education: N/A   Occupational History  . Not on file.   Social History Main Topics  . Smoking status: Never  Smoker   . Smokeless tobacco: Never Used  . Alcohol Use: No  . Drug Use: No  . Sexual Activity: Not Currently   Other Topics Concern  . Not on file   Social History Narrative   Married, 2 children. Retired from [employer] other systems reviewed and are negative.      Objective:   Physical Exam  Cardiovascular: Normal rate, regular rhythm and normal heart sounds.   Pulmonary/Chest: Effort normal and breath sounds normal.  Abdominal: Soft. Bowel sounds are normal. She exhibits no distension. There is no tenderness.  Vitals reviewed.         Assessment & Plan:  Burning with urination - Plan: Urinalysis, Routine w reflex microscopic (not at [facility]), ciprofloxacin (CIPRO) 500 MG tablet  Patient appears to have urinary tract infection. I will treat this with Cipro 500 mg by mouth twice a day for 5 days. I will also send a urine culture

## 2015-07-09 LAB — URINE CULTURE: Colony Count: >=100000

## 2015-07-10 ENCOUNTER — Encounter: Payer: Self-pay | Admitting: Family Medicine

## 2015-07-14 ENCOUNTER — Encounter: Payer: Medicare Other | Admitting: Internal Medicine

## 2015-07-23 ENCOUNTER — Other Ambulatory Visit: Payer: Self-pay | Admitting: Family Medicine

## 2015-07-23 MED ORDER — CARVEDILOL PHOSPHATE ER 40 MG PO CP24: 40.0000 mg | ORAL_CAPSULE | Freq: Every evening | ORAL | Status: DC

## 2015-07-23 MED ORDER — ROSUVASTATIN CALCIUM 20 MG PO TABS: 20.0000 mg | ORAL_TABLET | Freq: Every day | ORAL | Status: DC

## 2015-07-23 MED ORDER — CLOPIDOGREL BISULFATE 75 MG PO TABS
75.0000 mg | ORAL_TABLET | Freq: Every day | ORAL | Status: DC
Start: 2015-07-23 — End: 2016-04-05

## 2015-07-23 MED ORDER — HYDROCHLOROTHIAZIDE 12.5 MG PO CAPS: 12.5000 mg | ORAL_CAPSULE | Freq: Every day | ORAL | Status: DC

## 2015-07-23 MED ORDER — ISOSORBIDE MONONITRATE ER 60 MG PO TB24: 60.0000 mg | ORAL_TABLET | Freq: Every day | ORAL | Status: DC

## 2015-07-23 MED ORDER — LISINOPRIL 40 MG PO TABS: 40.0000 mg | ORAL_TABLET | Freq: Every day | ORAL | Status: DC

## 2015-07-23 MED ORDER — SYNTHROID 88 MCG PO TABS: ORAL_TABLET | ORAL | Status: DC

## 2015-07-23 NOTE — Telephone Encounter (Signed)
All requested meds refilled [facility]

## 2015-07-25 ENCOUNTER — Other Ambulatory Visit: Payer: Medicare Other

## 2015-07-25 ENCOUNTER — Telehealth: Payer: Self-pay | Admitting: Family Medicine

## 2015-07-25 DIAGNOSIS — R300 Dysuria: Secondary | ICD-10-CM

## 2015-07-25 LAB — URINALYSIS, MICROSCOPIC ONLY
Casts: NONE SEEN [LPF]
Crystals: NONE SEEN [HPF]
RBC / HPF: NONE SEEN RBC/HPF (ref <=2)
Yeast: NONE SEEN [HPF]

## 2015-07-25 LAB — URINALYSIS, ROUTINE W REFLEX MICROSCOPIC
Bilirubin Urine: NEGATIVE
Glucose, UA: NEGATIVE
Hgb urine dipstick: NEGATIVE
Ketones, ur: NEGATIVE
Nitrite: NEGATIVE
PH: 6 (ref 5.0–8.0)
PROTEIN: NEGATIVE
Specific Gravity, Urine: 1.01 (ref 1.001–1.035)

## 2015-07-25 MED ORDER — SULFAMETHOXAZOLE-TRIMETHOPRIM 800-160 MG PO TABS: 1.0000 | ORAL_TABLET | Freq: Two times a day (BID) | ORAL | Status: DC

## 2015-07-25 NOTE — Telephone Encounter (Signed)
Cx last time showed klebsiella sensitive to cipro.  Cipro should have treated.  Try Bactrim DS pobid for 10 days

## 2015-07-25 NOTE — Telephone Encounter (Signed)
Pt called and aware of Rx

## 2015-07-25 NOTE — Telephone Encounter (Signed)
Patient states she is still having UTI symptoms would like to know if something else could be called in. She states that she would come in and leave another specimen if needed. She was just seen on the 15th of August. She states she is still burning and urinating fregently.  Please advise.

## 2015-07-25 NOTE — Telephone Encounter (Signed)
Pt was very upset, had left voice message yesterday?? No one ever called her back.  I apoligized to her, not sure what happened to that message.  Pt still with same UTI symptoms she had when seen two weeks ago.  What do you advise?  Is going to come to office this afternoon and leave a follow up urine, she wants another culture sent.

## 2015-07-28 LAB — URINE CULTURE

## 2015-07-29 ENCOUNTER — Telehealth: Payer: Self-pay | Admitting: Family Medicine

## 2015-07-29 NOTE — Telephone Encounter (Signed)
Patient called the after hours line. She is being treated for urinary tract infection. She was on Cipro improved but then still had some residual symptoms. On Friday Bactrim was sent over by primary care provider however she didn't having upset stomach and muscle aches therefore she discontinued the medication. She does not tolerate keflex, Macrobid. She states that she had Klebsiella in the urine before. I sent over 3 more days of Cipro 500 mg twice a day over for this information to her PCP her cultures currently pending

## 2015-07-30 ENCOUNTER — Telehealth: Payer: Self-pay | Admitting: Family Medicine

## 2015-07-30 NOTE — Telephone Encounter (Signed)
Was not able to take Bactrim.  Dr Buelah Manis had called in another 3 days of Cipro over the weekend.  Better but still has a burning sensation down there and not necessarily when urinating.  What can we do?

## 2015-07-31 MED ORDER — LEVOFLOXACIN 500 MG PO TABS: 500.0000 mg | ORAL_TABLET | Freq: Every day | ORAL | Status: DC

## 2015-07-31 NOTE — Telephone Encounter (Signed)
Because of her allergies, her only other option would be levaquin 500 poqday for 7 days and if not 100% better NTBS.

## 2015-07-31 NOTE — Telephone Encounter (Signed)
Pt called and made aware of new Rx.  Told will need to be seen if not better after this treatment.

## 2015-09-22 ENCOUNTER — Ambulatory Visit (INDEPENDENT_AMBULATORY_CARE_PROVIDER_SITE_OTHER): Payer: Medicare Other | Admitting: Family Medicine

## 2015-09-22 ENCOUNTER — Encounter: Payer: Self-pay | Admitting: Family Medicine

## 2015-09-22 VITALS — BP 118/70 | HR 78 | Temp 98.4°F | Resp 16 | Ht 67.0 in | Wt 184.0 lb

## 2015-09-22 DIAGNOSIS — Z23 Encounter for immunization: Secondary | ICD-10-CM | POA: Diagnosis not present

## 2015-09-22 DIAGNOSIS — I2510 Atherosclerotic heart disease of native coronary artery without angina pectoris: Secondary | ICD-10-CM | POA: Diagnosis not present

## 2015-09-22 DIAGNOSIS — N390 Urinary tract infection, site not specified: Secondary | ICD-10-CM

## 2015-09-22 LAB — COMPLETE METABOLIC PANEL WITH GFR
ALT: 15 U/L (ref 6–29)
AST: 19 U/L (ref 10–35)
Albumin: 4 g/dL (ref 3.6–5.1)
Alkaline Phosphatase: 60 U/L (ref 33–130)
BUN: 17 mg/dL (ref 7–25)
CHLORIDE: 104 mmol/L (ref 98–110)
CO2: 29 mmol/L (ref 20–31)
Calcium: 9.1 mg/dL (ref 8.6–10.4)
Creat: 0.69 mg/dL (ref 0.60–0.88)
GFR, EST NON AFRICAN AMERICAN: 82 mL/min (ref >=60)
GFR, Est African American: >89 mL/min (ref >=60)
Glucose, Bld: 112 mg/dL — ABNORMAL HIGH (ref 70–99)
POTASSIUM: 4.5 mmol/L (ref 3.5–5.3)
Sodium: 140 mmol/L (ref 135–146)
Total Bilirubin: 0.6 mg/dL (ref 0.2–1.2)
Total Protein: 6.7 g/dL (ref 6.1–8.1)

## 2015-09-22 LAB — CBC WITH DIFFERENTIAL/PLATELET
Basophils Absolute: 0.1 10*3/uL (ref 0.0–0.1)
Basophils Relative: 1 % (ref 0–1)
EOS ABS: 0.2 10*3/uL (ref 0.0–0.7)
EOS PCT: 4 % (ref 0–5)
HCT: 40.6 % (ref 36.0–46.0)
Hemoglobin: 13.5 g/dL (ref 12.0–15.0)
LYMPHS ABS: 1.5 10*3/uL (ref 0.7–4.0)
Lymphocytes Relative: 26 % (ref 12–46)
MCH: 31.7 pg (ref 26.0–34.0)
MCHC: 33.3 g/dL (ref 30.0–36.0)
MCV: 95.3 fL (ref 78.0–100.0)
MONOS PCT: 8 % (ref 3–12)
MPV: 9.8 fL (ref 8.6–12.4)
Monocytes Absolute: 0.5 10*3/uL (ref 0.1–1.0)
Neutro Abs: 3.5 10*3/uL (ref 1.7–7.7)
Neutrophils Relative %: 61 % (ref 43–77)
PLATELETS: 232 10*3/uL (ref 150–400)
RBC: 4.26 MIL/uL (ref 3.87–5.11)
RDW: 13.6 % (ref 11.5–15.5)
WBC: 5.7 10*3/uL (ref 4.0–10.5)

## 2015-09-22 LAB — LIPID PANEL
Cholesterol: 156 mg/dL (ref 125–200)
HDL: 61 mg/dL (ref >=46)
LDL CALC: 71 mg/dL (ref <130)
TRIGLYCERIDES: 120 mg/dL (ref <150)
Total CHOL/HDL Ratio: 2.6 Ratio (ref <=5.0)
VLDL: 24 mg/dL (ref <30)

## 2015-09-22 NOTE — Progress Notes (Signed)
Subjective:    Patient ID: Lindsey Erickson, female    DOB: 06-22-1932, 81 y.o.   MRN: [id number]  HPI 03/18/15 Patient is a very pleasant 81 year old white female who is here today to establish care. She has a significant past medical history of coronary artery disease. According to her report she is suffered 2 separate myocardial infarctions. However on repeat catheterizations, the patient states that she has lesions which are not amenable to stenting. Therefore she is being managed medically. She is currently on a long-acting nitroglycerin, beta blocker, and ACE inhibitor, statin medication, and dual antiplatelet therapy. She follows up regularly with her cardiologist. Her blood pressure today is well controlled at 126/74. She denies any chest pain shortness of breath or dyspnea on exertion. She denies any myalgias or right upper quadrant pain. She also has a history of a multinodular goiter. Today on examination there is a large mass in the anterior portion of her neck. It is approximately 4 cm x 5 cm. Patient states that she has had 3 separate biopsies performed by her endocrinologist all of which is been negative. She is currently on Synthroid to help suppress the growth of a quarter. She denies any recent growth. She also has a history of hyperglycemia with borderline prediabetes with hemoglobin A1c 6.2 in August. She also has a history of osteoma anemia. However the last bone density I see was obtained last August and the worst T score she had was -1.8 in the spine. She has been on Actonel for more than 3 years. She is due for a break from bisphosphonate therapy. Her mammogram is up-to-date. She requires Pap smears. She no longer requires colonoscopies due to age. Her immunizations are up-to-date. She believes she has had Pneumovax 23 since she was 39.  At that time, my plan was: Blood pressures well controlled. I have asked the patient to return tomorrow fasting so that we can obtain a fasting lipid  panel, TSH, hemoglobin A1c, CBC, and a CMP. Given the chronicity of which she has been on bisphosphonate therapy, I have recommended a holiday away from the Actonel. I would recheck a bone density test in 2 years. Her T-scores begin to decline, at that time I would resume bisphosphonate therapy. I have recommended no further colonoscopy or Pap smear. I would consider her mammogram on a yearly basis depending on her health care. Otherwise her preventative care is up-to-date. 09/22/15 Overall the patient has been doing very well. She denies any chest pain or shortness of breath or dyspnea on exertion beyond her baseline. She denies any unstable angina. Her thyroid gland does appear slightly larger than it was earlier this year. There are no palpable abnormalities other than the generalized thyromegaly.. She denies any stridor. She denies any dysphasia. She is scheduled to see her endocrinologist later this year. She received her flu shot this morning. She is due for fasting lab work.    Past Medical History  Diagnosis Date  . Hypothyroidism   . Kidney stones   . Osteoporosis     no fractures  . Hyperlipidemia   . Arthritis   . HYPERTENSION     a. Also has h/o intermittentl low BP.  Marland Kitchen CAD (coronary artery disease)     a. NSTEMI 2004 with thrombus in Cx and mod RCA. b. Nonischemic nuc 2008. c. mild NSTEMI 01/2013 (trop 0.42) -> cath with unchanged mod LAD & RCA disease, for continued medical therapy.  . Recurrent urinary tract infection   .  H/O Hashimoto thyroiditis   . QT prolongation     a. Prior EKGs QTc 474, but on 4/1 was 524 for unclear reason.  . Anemia   . Myocardial infarction   . Cancer   . Blood transfusion without reported diagnosis 1957  . Cataract   . Squamous cell carcinoma of skin 04/17/2015   Past Surgical History  Procedure Laterality Date  . Appendectomy  1999  . Cholecystectomy  02/2010  . Bilateral arthroscopies of knees    . Joint replacement  01/2010    L TKR -  allusio  . Cardiac catheterization      Moderate 3 vessel CAD by cath 05/2012  . Left heart catheterization with coronary angiogram N/A 05/22/2012    Procedure: LEFT HEART CATHETERIZATION WITH CORONARY ANGIOGRAM;  Surgeon: Sinclair Grooms, MD;  Location: [facility] CATH LAB;  Service: Cardiovascular;  Laterality: N/A;  . Left heart catheterization with coronary angiogram N/A 02/20/2014    Procedure: LEFT HEART CATHETERIZATION WITH CORONARY ANGIOGRAM;  Surgeon: Peter M Martinique, MD;  Location: [facility] CATH LAB;  Service: Cardiovascular;  Laterality: N/A;  . Tubal ligation     Current Outpatient Prescriptions on File Prior to Visit  Medication Sig Dispense Refill  . acetaminophen (TYLENOL) 500 MG tablet Take 500 mg by mouth every 6 (six) hours as needed for moderate pain.    . Ascorbic Acid (VITAMIN C) 500 MG tablet Take 1,000 mg by mouth 2 (two) times daily.     Marland Kitchen aspirin 81 MG tablet Take 81 mg by mouth daily.      . carvedilol (COREG CR) 40 MG 24 hr capsule Take 1 capsule (40 mg total) by mouth every evening. 90 capsule 3  . clindamycin (CLINDAGEL) 1 % gel Apply 1 application topically daily.    . clopidogrel (PLAVIX) 75 MG tablet Take 1 tablet (75 mg total) by mouth daily. 90 tablet 3  . hydrochlorothiazide (MICROZIDE) 12.5 MG capsule Take 1 capsule (12.5 mg total) by mouth daily. 90 capsule 3  . isosorbide mononitrate (IMDUR) 60 MG 24 hr tablet Take 1 tablet (60 mg total) by mouth daily. 90 tablet 3  . levofloxacin (LEVAQUIN) 500 MG tablet Take 1 tablet (500 mg total) by mouth daily. 7 tablet 0  . lisinopril (PRINIVIL,ZESTRIL) 40 MG tablet Take 1 tablet (40 mg total) by mouth daily. 90 tablet 3  . Methylsulfonylmethane 1000 MG CAPS Take 1,000 mg by mouth 2 (two) times daily.     . Multiple Vitamins-Minerals (CENTRUM SILVER ADULT 50+ PO) Take 1 tablet by mouth daily.    . Niacin (VITAMIN B-3 PO) Take 1 capsule by mouth every morning.     . nitroGLYCERIN (NITROSTAT) 0.4 MG SL tablet Place 1 tablet (0.4 mg  total) under the tongue every 5 (five) minutes as needed. For chest pain 4 tablet 1  . rosuvastatin (CRESTOR) 20 MG tablet Take 1 tablet (20 mg total) by mouth at bedtime. 90 tablet 3  . SYNTHROID 88 MCG tablet TAKE 1 TABLET DAILY 90 tablet 3   No current facility-administered medications on file prior to visit.   Allergies  Allergen Reactions  . 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   ALL NARCOTICS- NAUSEA AND VOMITING   . Adhesive [Tape] Itching, Swelling, Rash and Other (See Comments)    Also pain at location  . Codeine Nausea And Vomiting  . Bactrim [Sulfamethoxazole-Trimethoprim]  Muscle aches  . Cephalexin Hives  . Macrobid [Nitrofurantoin Monohyd Macro] Nausea And Vomiting   Social History   Social History  . Marital Status: Married    Spouse Name: N/A  . Number of Children: N/A  . Years of Education: N/A   Occupational History  . Not on file.   Social History Main Topics  . Smoking status: Never Smoker   . Smokeless tobacco: Never Used  . Alcohol Use: No  . Drug Use: No  . Sexual Activity: Not Currently   Other Topics Concern  . Not on file   Social History Narrative   Married, 2 children. Retired from [employer]   Family History  Problem Relation Age of Onset  . Allergies Father   . Cancer Father   . Coronary artery disease Mother 42  . Heart disease Mother   . CAD Sister 34  . Cancer Sister   . AAA (abdominal aortic aneurysm) Sister   . Hypertension Sister   . Hyperlipidemia Sister   . Heart disease Sister       Review of Systems  All other systems reviewed and are negative.      Objective:   Physical Exam  Constitutional: She is oriented to person, place, and time. She appears well-developed and well-nourished. No distress.  Neck: Neck supple. No JVD present. No tracheal deviation present. Thyromegaly present.  Cardiovascular: Normal rate,  regular rhythm and normal heart sounds.   No murmur heard. Pulmonary/Chest: Effort normal and breath sounds normal. No respiratory distress. She has no wheezes. She has no rales.  Abdominal: Soft. Bowel sounds are normal. She exhibits no distension. There is no tenderness. There is no rebound and no guarding.  Musculoskeletal: She exhibits no edema.  Lymphadenopathy:    She has no cervical adenopathy.  Neurological: She is alert and oriented to person, place, and time. She has normal reflexes. No cranial nerve deficit. She exhibits normal muscle tone. Coordination normal.  Skin: She is not diaphoretic.  Vitals reviewed.         Assessment & Plan:  Need for prophylactic vaccination and inoculation against influenza - Plan: Flu Vaccine QUAD 36+ mos IM  ASCVD (arteriosclerotic cardiovascular disease) - Plan: CBC with Differential/Platelet, COMPLETE METABOLIC PANEL WITH GFR, Lipid panel  Urinary tract infection without hematuria, site unspecified - Plan: Urine culture  Patient had a recurrent urinary tract infection that persisted for over 2 months in August and September. I will check a urine culture to ensure complete resolution of this. Patient got a flu shot this morning. Her blood pressures well controlled. I will check fasting lab work to monitor fasting lipid panel to ensure that her LDL cholesterol is less than 70.

## 2015-09-23 LAB — URINE CULTURE: Colony Count: >=100000

## 2015-09-25 ENCOUNTER — Encounter: Payer: Self-pay | Admitting: *Deleted

## 2015-10-02 ENCOUNTER — Other Ambulatory Visit (INDEPENDENT_AMBULATORY_CARE_PROVIDER_SITE_OTHER): Payer: Medicare Other

## 2015-10-02 DIAGNOSIS — E042 Nontoxic multinodular goiter: Secondary | ICD-10-CM | POA: Diagnosis not present

## 2015-10-02 LAB — T4, FREE: Free T4: 0.98 ng/dL (ref 0.60–1.60)

## 2015-10-02 LAB — TSH: TSH: 0.36 u[IU]/mL (ref 0.35–4.50)

## 2015-10-08 ENCOUNTER — Encounter: Payer: Self-pay | Admitting: Endocrinology

## 2015-10-08 ENCOUNTER — Other Ambulatory Visit: Payer: Self-pay | Admitting: *Deleted

## 2015-10-08 ENCOUNTER — Ambulatory Visit (INDEPENDENT_AMBULATORY_CARE_PROVIDER_SITE_OTHER): Payer: Medicare Other | Admitting: Endocrinology

## 2015-10-08 VITALS — BP 126/86 | HR 72 | Temp 98.5°F | Resp 14 | Ht 67.0 in | Wt 184.4 lb

## 2015-10-08 DIAGNOSIS — E042 Nontoxic multinodular goiter: Secondary | ICD-10-CM | POA: Diagnosis not present

## 2015-10-08 DIAGNOSIS — R7301 Impaired fasting glucose: Secondary | ICD-10-CM

## 2015-10-08 DIAGNOSIS — I2510 Atherosclerotic heart disease of native coronary artery without angina pectoris: Secondary | ICD-10-CM | POA: Diagnosis not present

## 2015-10-08 MED ORDER — SYNTHROID 88 MCG PO TABS
ORAL_TABLET | ORAL | Status: DC
Start: 2015-10-08 — End: 2015-10-09

## 2015-10-08 NOTE — Progress Notes (Signed)
Patient ID: Lindsey Erickson, female   DOB: 1931-12-07, 81 y.o.   MRN: [id number]  Reason for Appointment:  Thyroid problems, followup visit    History of Present Illness:   She initially had a multinodular goiter in 1982 She has had 3 biopsies of her thyroid nodules subsequently with the last one in 2003 which were benign Her biopsies had shown a few follicular cells and lymphocytes, not clear which nodule was biopsied She does not complain of any choking or difficulty swallowing  ? Hypothyroidism:  Most likely she has been on thyroid suppression for the goiter rather than true hypothyroidism although previous records do indicate Hashimoto thyroiditis  Complaints are reported by the patient now are none and she has no unusual fatigue or weight change    The treatments that the patient has taken include Synthroid 88 mcg for several years .          Compliance with the medical regimen has been as prescribed with taking the tablet in the morning before breakfast.  TSH appears to be consistent in normal range although it is relatively lower now  Lab Results  Component Value Date   TSH 0.36 10/02/2015   TSH 0.531 03/24/2015   TSH 1.39 10/03/2014   FREET4 0.98 10/02/2015   FREET4 1.10 10/03/2014   FREET4 1.11 10/03/2013      PREDIABETES:   Her last fasting glucose is higher at 112 Has not had a glucose tolerance test However A1c has been consistently in the normal range  She thinks she is watching her diet with limiting fats and sweets but is unable to lose weight Is not able to exercise because of knee pain     Wt Readings from Last 3 Encounters:  09/22/15 184 lb (83.462 kg)  07/07/15 187 lb (84.823 kg)  05/29/15 185 lb (83.915 kg)    Lab Results  Component Value Date   HGBA1C 5.9* 03/24/2015   HGBA1C 6.1 07/08/2014   HGBA1C 6.1 10/03/2013   Lab Results  Component Value Date   LDLCALC 71 09/22/2015   CREATININE 0.69 09/22/2015       Medication List         This list is accurate as of: 10/08/15  3:36 PM.  Always use your most recent med list.               acetaminophen 500 MG tablet  Commonly known as:  TYLENOL  Take 500 mg by mouth every 6 (six) hours as needed for moderate pain.     aspirin 81 MG tablet  Take 81 mg by mouth daily.     carvedilol 40 MG 24 hr capsule  Commonly known as:  COREG CR  Take 1 capsule (40 mg total) by mouth every evening.     CENTRUM SILVER ADULT 50+ PO  Take 1 tablet by mouth daily.     clindamycin 1 % gel  Commonly known as:  CLINDAGEL  Apply 1 application topically daily.     clopidogrel 75 MG tablet  Commonly known as:  PLAVIX  Take 1 tablet (75 mg total) by mouth daily.     hydrochlorothiazide 12.5 MG capsule  Commonly known as:  MICROZIDE  Take 1 capsule (12.5 mg total) by mouth daily.     isosorbide mononitrate 60 MG 24 hr tablet  Commonly known as:  IMDUR  Take 1 tablet (60 mg total) by mouth daily.     lisinopril 40 MG tablet  Commonly known as:  PRINIVIL,ZESTRIL  Take 1 tablet (40 mg total) by mouth daily.     Methylsulfonylmethane 1000 MG Caps  Take 1,000 mg by mouth 2 (two) times daily.     nitroGLYCERIN 0.4 MG SL tablet  Commonly known as:  NITROSTAT  Place 1 tablet (0.4 mg total) under the tongue every 5 (five) minutes as needed. For chest pain     rosuvastatin 20 MG tablet  Commonly known as:  CRESTOR  Take 1 tablet (20 mg total) by mouth at bedtime.     SYNTHROID 88 MCG tablet  Generic drug:  levothyroxine  TAKE 1 TABLET DAILY     VITAMIN B-3 PO  Take 1 capsule by mouth every morning.     vitamin C 500 MG tablet  Commonly known as:  ASCORBIC ACID  Take 1,000 mg by mouth 2 (two) times daily.        Allergies:  Allergies  Allergen Reactions  . 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   ALL NARCOTICS- NAUSEA AND VOMITING   . Adhesive [Tape] Itching, Swelling,  Rash and Other (See Comments)    Also pain at location  . Codeine Nausea And Vomiting  . Bactrim [Sulfamethoxazole-Trimethoprim]     Muscle aches  . Cephalexin Hives  . Macrobid [Nitrofurantoin Monohyd Macro] Nausea And Vomiting    Past Medical History  Diagnosis Date  . Hypothyroidism   . Kidney stones   . Osteoporosis     no fractures  . Hyperlipidemia   . Arthritis   . HYPERTENSION     a. Also has h/o intermittentl low BP.  Marland Kitchen CAD (coronary artery disease)     a. NSTEMI 2004 with thrombus in Cx and mod RCA. b. Nonischemic nuc 2008. c. mild NSTEMI 01/2013 (trop 0.42) -> cath with unchanged mod LAD & RCA disease, for continued medical therapy.  . Recurrent urinary tract infection   . H/O Hashimoto thyroiditis   . QT prolongation     a. Prior EKGs QTc 474, but on 4/1 was 524 for unclear reason.  . Anemia   . Myocardial infarction ([place])   . Cancer ([place])   . Blood transfusion without reported diagnosis 1957  . Cataract   . Squamous cell carcinoma of skin 04/17/2015    Past Surgical History  Procedure Laterality Date  . Appendectomy  1999  . Cholecystectomy  02/2010  . Bilateral arthroscopies of knees    . Joint replacement  01/2010    L TKR - allusio  . Cardiac catheterization      Moderate 3 vessel CAD by cath 05/2012  . Left heart catheterization with coronary angiogram N/A 05/22/2012    Procedure: LEFT HEART CATHETERIZATION WITH CORONARY ANGIOGRAM;  Surgeon: Sinclair Grooms, MD;  Location: [facility] CATH LAB;  Service: Cardiovascular;  Laterality: N/A;  . Left heart catheterization with coronary angiogram N/A 02/20/2014    Procedure: LEFT HEART CATHETERIZATION WITH CORONARY ANGIOGRAM;  Surgeon: Peter M Martinique, MD;  Location: [facility] CATH LAB;  Service: Cardiovascular;  Laterality: N/A;  . Tubal ligation      Family History  Problem Relation Age of Onset  . Allergies Father   . Cancer Father   . Coronary artery disease Mother 52  . Heart disease Mother   . CAD Sister 40  . Cancer  Sister   . AAA (abdominal aortic aneurysm) Sister   . Hypertension Sister   .  Hyperlipidemia Sister   . Heart disease Sister     Social History:  reports that she has never smoked. She has never used smokeless tobacco. She reports that she does not drink alcohol or use illicit drugs.  REVIEW Of SYSTEMS:   She apparently has osteoporosis, treated by PCP    Currently symptomatic from her CAD, followed regularly by cardiologist  Hypertension: Is well-controlled on a 2 drug regimen   Examination:   There were no vitals taken for this visit.   NECK:  circumference is 41.0 cm     She has a goiter with a 3 times enlarged right lobe, about 3 times enlarged left lobe and Isthmus Her left lobe his more firm without any distant nodule; right lobe is smoother   NEUROLOGIC EXAM: DTRs 2+ bilaterally at biceps with normal relaxation.     Assessments   Multinodular goiter, clinically stable with no local pressure symptoms The nodules appear to be similar in size and overall there is no growth but Conference about the same as before  ? Coexisting hypothyroidism and Hashimoto thyroiditis. She has required lower doses in the last few years for supplement  Her TSH level has been trending lower now and is only 0.36 Discussed that she may not need thyroid supplementation and will first try to reduce her dose of 50 g We will have her follow-up in 2 months with repeat level  IFG/prediabetes: Her fasting glucose is higher than normal at 112 and probably higher than before She agrees to see the dietitian for meal planning She is unable to exercise much but is generally watching her diet   Kelly Ranieri 10/08/2015, 3:36 PM

## 2015-10-09 ENCOUNTER — Telehealth: Payer: Self-pay | Admitting: *Deleted

## 2015-10-09 ENCOUNTER — Other Ambulatory Visit: Payer: Self-pay | Admitting: *Deleted

## 2015-10-09 MED ORDER — SYNTHROID 50 MCG PO TABS: 50.0000 ug | ORAL_TABLET | Freq: Every day | ORAL | Status: DC

## 2015-10-09 NOTE — Telephone Encounter (Signed)
50ug

## 2015-10-09 NOTE — Telephone Encounter (Signed)
Patient called, she said you were going to reduce her Synthroid dose, but I sent in the 88 mcg. What is the correct dose suppose to be?

## 2015-10-09 NOTE — Telephone Encounter (Signed)
Noted rx sent patient is aware.

## 2015-11-18 ENCOUNTER — Telehealth: Payer: Self-pay | Admitting: Family Medicine

## 2015-11-18 MED ORDER — CIPROFLOXACIN HCL 500 MG PO TABS: 500.0000 mg | ORAL_TABLET | Freq: Two times a day (BID) | ORAL | Status: DC

## 2015-11-18 NOTE — Telephone Encounter (Signed)
Pt called and states that she has a UTI- pt was advised that Dr. Dennard Schaumann can not see her until Thursday, so she would like to know if she can just come in to leave a specimen. Please advise [phone]

## 2015-11-18 NOTE — Telephone Encounter (Signed)
Blakely with that or we could call out cipro 500 bid for 5 days.

## 2015-11-18 NOTE — Telephone Encounter (Signed)
Medication called/sent to requested pharmacy and pt aware 

## 2015-12-08 ENCOUNTER — Other Ambulatory Visit: Payer: Self-pay | Admitting: *Deleted

## 2015-12-08 MED ORDER — NITROGLYCERIN 0.4 MG SL SUBL: 0.4000 mg | SUBLINGUAL_TABLET | SUBLINGUAL | Status: DC | PRN

## 2015-12-08 NOTE — Telephone Encounter (Signed)
Received fax requesting refill on Nitrostat.   Refill appropriate and filled per protocol. 

## 2015-12-22 ENCOUNTER — Other Ambulatory Visit: Payer: Self-pay

## 2015-12-22 ENCOUNTER — Other Ambulatory Visit (INDEPENDENT_AMBULATORY_CARE_PROVIDER_SITE_OTHER): Payer: Medicare Other

## 2015-12-22 DIAGNOSIS — E042 Nontoxic multinodular goiter: Secondary | ICD-10-CM | POA: Diagnosis not present

## 2015-12-22 DIAGNOSIS — Z1231 Encounter for screening mammogram for malignant neoplasm of breast: Secondary | ICD-10-CM

## 2015-12-22 LAB — T4, FREE: Free T4: 0.91 ng/dL (ref 0.60–1.60)

## 2015-12-22 LAB — TSH: TSH: 1.65 u[IU]/mL (ref 0.35–4.50)

## 2015-12-24 ENCOUNTER — Ambulatory Visit: Payer: Medicare Other | Admitting: Endocrinology

## 2015-12-25 ENCOUNTER — Encounter: Payer: Self-pay | Admitting: Endocrinology

## 2015-12-25 ENCOUNTER — Ambulatory Visit (INDEPENDENT_AMBULATORY_CARE_PROVIDER_SITE_OTHER): Payer: Medicare Other | Admitting: Endocrinology

## 2015-12-25 VITALS — BP 116/72 | HR 73 | Temp 98.0°F | Resp 16 | Ht 67.0 in | Wt 184.4 lb

## 2015-12-25 DIAGNOSIS — R7301 Impaired fasting glucose: Secondary | ICD-10-CM | POA: Diagnosis not present

## 2015-12-25 DIAGNOSIS — E042 Nontoxic multinodular goiter: Secondary | ICD-10-CM

## 2015-12-25 NOTE — Progress Notes (Signed)
Patient ID: Lindsey Erickson, female   DOB: [birth year], 82 y.o.   MRN: [id number]  Reason for Appointment:  Thyroid  condition, followup visit    History of Present Illness:   She initially had a multinodular goiter in 1982 She has had 3 biopsies of her thyroid nodules subsequently with the last one in 2003 which were benign Her biopsies had shown a few follicular cells and lymphocytes, not clear which nodule was biopsied She does not complain of any choking or difficulty swallowing  ? Hypothyroidism:  Most likely she has been on thyroid suppression for the goiter rather than true hypothyroidism although previous records do indicate Hashimoto thyroiditis  The treatments that the patient has taken include Synthroid 88 mcg for several years    This was reduced to 50 g in 11/16 because of low normal TSH of 0.36.             However now she thinks she gets tired at times compared to before Also is more cold intolerant   Compliance with the medical regimen has been as prescribed with taking the tablet in the morning before breakfast.  TSH  Is back to normal , no change in the free T4   Lab Results  Component Value Date   TSH 1.65 12/22/2015   TSH 0.36 10/02/2015   TSH 0.531 03/24/2015   FREET4 0.91 12/22/2015   FREET4 0.98 10/02/2015   FREET4 1.10 10/03/2014    PREDIABETES:   Her last fasting glucose was higher at 112 in 11/16 Has not had a glucose tolerance test However A1c has been consistently in the normal range previously  She thinks she is watching her diet with limiting fats and sweets but is unable to lose weight Is not able to exercise because of knee pain , currently doing only some stretching exercises    Wt Readings from Last 3 Encounters:  12/25/15 184 lb 6.4 oz (83.643 kg)  10/08/15 184 lb 6.4 oz (83.643 kg)  09/22/15 184 lb (83.462 kg)    Lab Results  Component Value Date   HGBA1C 5.9* 03/24/2015   HGBA1C 6.1 07/08/2014   HGBA1C 6.1  10/03/2013   Lab Results  Component Value Date   LDLCALC 71 09/22/2015   CREATININE 0.69 09/22/2015       Medication List       This list is accurate as of: 12/25/15 12:47 PM.  Always use your most recent med list.               acetaminophen 500 MG tablet  Commonly known as:  TYLENOL  Take 500 mg by mouth every 6 (six) hours as needed for moderate pain.     aspirin 81 MG tablet  Take 81 mg by mouth daily.     carvedilol 40 MG 24 hr capsule  Commonly known as:  COREG CR  Take 1 capsule (40 mg total) by mouth every evening.     CENTRUM SILVER ADULT 50+ PO  Take 1 tablet by mouth daily.     ciprofloxacin 500 MG tablet  Commonly known as:  CIPRO  Take 1 tablet (500 mg total) by mouth 2 (two) times daily.     clindamycin 1 % gel  Commonly known as:  CLINDAGEL  Apply 1 application topically daily.     clopidogrel 75 MG tablet  Commonly known as:  PLAVIX  Take 1 tablet (75 mg total) by mouth daily.     hydrochlorothiazide 12.5 MG capsule  Commonly known as:  MICROZIDE  Take 1 capsule (12.5 mg total) by mouth daily.     isosorbide mononitrate 60 MG 24 hr tablet  Commonly known as:  IMDUR  Take 1 tablet (60 mg total) by mouth daily.     lisinopril 40 MG tablet  Commonly known as:  PRINIVIL,ZESTRIL  Take 1 tablet (40 mg total) by mouth daily.     Methylsulfonylmethane 1000 MG Caps  Take 1,000 mg by mouth 2 (two) times daily.     nitroGLYCERIN 0.4 MG SL tablet  Commonly known as:  NITROSTAT  Place 1 tablet (0.4 mg total) under the tongue every 5 (five) minutes as needed. For chest pain     rosuvastatin 20 MG tablet  Commonly known as:  CRESTOR  Take 1 tablet (20 mg total) by mouth at bedtime.     SYNTHROID 50 MCG tablet  Generic drug:  levothyroxine  Take 1 tablet (50 mcg total) by mouth daily before breakfast. DAW     VITAMIN B-3 PO  Take 1 capsule by mouth every morning.     vitamin C 500 MG tablet  Commonly known as:  ASCORBIC ACID  Take 1,000 mg by  mouth 2 (two) times daily.        Allergies:  Allergies  Allergen Reactions  . 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   ALL NARCOTICS- NAUSEA AND VOMITING   . Adhesive [Tape] Itching, Swelling, Rash and Other (See Comments)    Also pain at location  . Codeine Nausea And Vomiting  . Bactrim [Sulfamethoxazole-Trimethoprim]     Muscle aches  . Cephalexin Hives  . Macrobid [Nitrofurantoin Monohyd Macro] Nausea And Vomiting    Past Medical History  Diagnosis Date  . Hypothyroidism   . Kidney stones   . Osteoporosis     no fractures  . Hyperlipidemia   . Arthritis   . HYPERTENSION     a. Also has h/o intermittentl low BP.  Marland Kitchen CAD (coronary artery disease)     a. NSTEMI 2004 with thrombus in Cx and mod RCA. b. Nonischemic nuc 2008. c. mild NSTEMI 01/2013 (trop 0.42) -> cath with unchanged mod LAD & RCA disease, for continued medical therapy.  . Recurrent urinary tract infection   . H/O Hashimoto thyroiditis   . QT prolongation     a. Prior EKGs QTc 474, but on 4/1 was 524 for unclear reason.  . Anemia   . Myocardial infarction ([place])   . Cancer ([place])   . Blood transfusion without reported diagnosis 1957  . Cataract   . Squamous cell carcinoma of skin 04/17/2015    Past Surgical History  Procedure Laterality Date  . Appendectomy  1999  . Cholecystectomy  02/2010  . Bilateral arthroscopies of knees    . Joint replacement  01/2010    L TKR - allusio  . Cardiac catheterization      Moderate 3 vessel CAD by cath 05/2012  . Left heart catheterization with coronary angiogram N/A 05/22/2012    Procedure: LEFT HEART CATHETERIZATION WITH CORONARY ANGIOGRAM;  Surgeon: Sinclair Grooms, MD;  Location: [facility] CATH LAB;  Service: Cardiovascular;  Laterality: N/A;  . Left heart catheterization with coronary angiogram N/A 02/20/2014    Procedure: LEFT HEART CATHETERIZATION WITH CORONARY ANGIOGRAM;  Surgeon:  Peter M Martinique, MD;  Location: [facility] CATH LAB;  Service: Cardiovascular;  Laterality: N/A;  .  Tubal ligation      Family History  Problem Relation Age of Onset  . Allergies Father   . Cancer Father   . Coronary artery disease Mother 22  . Heart disease Mother   . CAD Sister 56  . Cancer Sister   . AAA (abdominal aortic aneurysm) Sister   . Hypertension Sister   . Hyperlipidemia Sister   . Diabetes Sister   . Heart disease Sister   . Diabetes Maternal Aunt     Social History:  reports that she has never smoked. She has never used smokeless tobacco. She reports that she does not drink alcohol or use illicit drugs.  REVIEW Of SYSTEMS:   She apparently has osteoporosis, treated by PCP    Currently symptomatic from her CAD, followed regularly by cardiologist  Hypertension: Is well-controlled on a 2 drug regimen   Examination:   BP 116/72 mmHg  Pulse 73  Temp(Src) 98 F (36.7 C)  Resp 16  Ht 5\' 7"  (1.702 m)  Wt 184 lb 6.4 oz (83.643 kg)  BMI 28.87 kg/m2  SpO2 97%       Assessments   Multinodular goiter, clinically stable with no local pressure symptoms  ? Coexisting hypothyroidism and Hashimoto thyroiditis.  Normal TSH even with reducing the dose to 50 g and.if she has any significant hypothyroidism However patient feels that she is experiencing cold intolerance and some fatigue  Since her TSH is fairly normal and less than 2 will not increase her dose significantly For now she can try adding half a tablet extra 3 times a week to give her the equivalent of 60 g daily on an average Follow-up in 4 months  Impaired fasting glucose: Unable to get nutritional consultation scheduled because of Medicare not covering the condition Given chair exercises to do  [facility] 12/25/2015, 12:47 PM

## 2015-12-25 NOTE — Patient Instructions (Signed)
  Extra 1/2 thyroid pill on Mon Wed Fridays

## 2015-12-26 ENCOUNTER — Telehealth: Payer: Self-pay | Admitting: Endocrinology

## 2015-12-26 NOTE — Telephone Encounter (Signed)
Patient Name: Lindsey Erickson Gender: Female DOB: Mar 27, 1932 Age: 82 Y 68 M 17 D Return Phone Number: [phone] (Primary) Address: City/State/Zip: [place] Client [facility] Night - Client Client Site [facility] Contact Type Call Caller Name Regeana Nissen Phone Number [phone] Relationship To Patient Self Is this call to report lab results? No Call Type General Information Initial Comment Please call Mawa Pitzer at [phone]. She was waiting to hear from her MD's nurse, and was trying to catch them before they left office. General Information Type Message Only Nurse Assessment Guidelines Guideline Title Affirmed Question Affirmed Notes Nurse Date/Time (Eastern Time) Disp. Time Eilene Ghazi Time) Disposition Final User 12/25/2015 5:09:19 PM General Information Provided Yes Noelle Penner After Care Instructions Given Call Event Type User

## 2015-12-26 NOTE — Telephone Encounter (Signed)
Please verify.

## 2016-01-08 ENCOUNTER — Ambulatory Visit: Payer: Medicare Other

## 2016-03-09 ENCOUNTER — Ambulatory Visit (INDEPENDENT_AMBULATORY_CARE_PROVIDER_SITE_OTHER): Payer: Medicare Other | Admitting: Cardiovascular Disease

## 2016-03-09 ENCOUNTER — Encounter: Payer: Self-pay | Admitting: Cardiovascular Disease

## 2016-03-09 VITALS — BP 140/80 | HR 78 | Ht 67.0 in | Wt 183.0 lb

## 2016-03-09 DIAGNOSIS — I10 Essential (primary) hypertension: Secondary | ICD-10-CM | POA: Diagnosis not present

## 2016-03-09 DIAGNOSIS — I2510 Atherosclerotic heart disease of native coronary artery without angina pectoris: Secondary | ICD-10-CM

## 2016-03-09 DIAGNOSIS — E785 Hyperlipidemia, unspecified: Secondary | ICD-10-CM

## 2016-03-09 DIAGNOSIS — I2583 Coronary atherosclerosis due to lipid rich plaque: Secondary | ICD-10-CM

## 2016-03-09 NOTE — Assessment & Plan Note (Signed)
History of CAD with history of non-STEMI in 2400 thrombus or circumflex and moderate RCA disease. She had negative stress test in 2008 and underwent cardiac catheterization 02/20/14 by Dr. Irish Lack revealing a separate 70% mid RCA lesion, 50-60% mid circumflex lesion and the RCA underwent FFR interrogation was measured 0.9 suggesting that this was not physiologically significant. She does get occasional nitroglycerin responsive angina which has not changed in frequency or severity.

## 2016-03-09 NOTE — Progress Notes (Signed)
03/09/2016 Lindsey Erickson   12/23/1931  [id number]  Primary Physician Odette Fraction, MD Primary Cardiologist: Lorretta Harp MD Renae Gloss   HPI:  Lindsey Erickson is an 82 year old mildly overweight married Caucasian female mother of 2 children , grandmother of 4 grandchildren who is self-referred to be established in my practice. She was previously a cardiology patient of Dr. Pernell Dupre. Her new primary care physician is Dr. Margaretmary Eddy at [facility] family practice. I last saw her in the office 03/19/15. Cardiovascular risk factor profile is notable for treated hypertension and hyperlipidemia. She does have a family history his sister who's had stents. Her cardiac history dates back to 2004 when she had a non-STEMI related to thrombus in the circumflex with moderate RCA disease. She had a negative nuclear stress test in 2008 and underwent cardiac catheterization one year ago on 02/20/14 by Dr. Irish Lack after being admitted with chest pain and evolving positive enzymes. She gets infrequent nitroglycerin responsive chest pain. Her anatomy at the time of her last cath was notable for a 70% mid RCA lesion, 50-60% mid-circumflex lesion. The RCA underwent FFR interrogation which measured 0.9 suggesting that it was not physiologically significant. Since I saw her a year ago she's remained clinically stable with occasional nitroglycerin responsive angina.   Current Outpatient Prescriptions  Medication Sig Dispense Refill  . acetaminophen (TYLENOL) 500 MG tablet Take 500 mg by mouth every 6 (six) hours as needed for moderate pain.    . Ascorbic Acid (VITAMIN C) 500 MG tablet Take 1,000 mg by mouth 2 (two) times daily.     Marland Kitchen aspirin 81 MG tablet Take 81 mg by mouth daily.      . carvedilol (COREG CR) 40 MG 24 hr capsule Take 1 capsule (40 mg total) by mouth every evening. 90 capsule 3  . Cholecalciferol (VITAMIN D3 PO) Take 1 tablet by mouth daily.    . clindamycin (CLINDAGEL) 1 %  gel Apply 1 application topically daily.    . clopidogrel (PLAVIX) 75 MG tablet Take 1 tablet (75 mg total) by mouth daily. 90 tablet 3  . hydrochlorothiazide (MICROZIDE) 12.5 MG capsule Take 12.5 mg by mouth daily. Take 1 tab Monday, Wednesday and Friday None on other days    . isosorbide mononitrate (IMDUR) 60 MG 24 hr tablet Take 1 tablet (60 mg total) by mouth daily. 90 tablet 3  . lisinopril (PRINIVIL,ZESTRIL) 40 MG tablet Take 1 tablet (40 mg total) by mouth daily. 90 tablet 3  . Methylsulfonylmethane 1000 MG CAPS Take 1,000 mg by mouth 2 (two) times daily.     . Multiple Vitamins-Minerals (CENTRUM SILVER ADULT 50+ PO) Take 1 tablet by mouth daily.    . nitroGLYCERIN (NITROSTAT) 0.4 MG SL tablet Place 1 tablet (0.4 mg total) under the tongue every 5 (five) minutes as needed. For chest pain 20 tablet 1  . rosuvastatin (CRESTOR) 20 MG tablet Take 1 tablet (20 mg total) by mouth at bedtime. 90 tablet 3  . SYNTHROID 50 MCG tablet Take 1 tablet (50 mcg total) by mouth daily before breakfast. DAW (Patient taking differently: Take 50 mcg by mouth daily before breakfast. DAW; take 1.5 tabs(79mcg) on Monday, Wednesday and Friday and 1 tab on Tuesday, Thursday, Saturday and Sunday) 90 tablet 3   No current facility-administered medications for this visit.    Allergies  Allergen Reactions  . Other Other (See Comments)    Frozen Blood Plasma-caused patient to crash / cardiac arrest /  CAN NEVER TAKE THIS AGAIN-PER MD'S.   ALL NARCOTICS- NAUSEA AND VOMITING   . Adhesive [Tape] Itching, Swelling, Rash and Other (See Comments)    Also pain at location  . Codeine Nausea And Vomiting  . Bactrim [Sulfamethoxazole-Trimethoprim]     Muscle aches  . Cephalexin Hives  . Macrobid [Nitrofurantoin Monohyd Macro] Nausea And Vomiting    Social History   Social History  . Marital Status: Married    Spouse Name: N/A  . Number of Children: N/A  . Years of Education: N/A   Occupational History  . Not on  file.   Social History Main Topics  . Smoking status: Never Smoker   . Smokeless tobacco: Never Used  . Alcohol Use: No  . Drug Use: No  . Sexual Activity: Not Currently   Other Topics Concern  . Not on file   Social History Narrative   Married, 2 children. Retired from [employer]: General: negative for chills, fever, night sweats or weight changes.  Cardiovascular: negative for chest pain, dyspnea on exertion, edema, orthopnea, palpitations, paroxysmal nocturnal dyspnea or shortness of breath Dermatological: negative for rash Respiratory: negative for cough or wheezing Urologic: negative for hematuria Abdominal: negative for nausea, vomiting, diarrhea, bright red blood per rectum, melena, or hematemesis Neurologic: negative for visual changes, syncope, or dizziness All other systems reviewed and are otherwise negative except as noted above.    Blood pressure 140/80, pulse 78, height 5\' 7"  (1.702 m), weight 183 lb (83.008 kg).  General appearance: alert and no distress Neck: no adenopathy, no carotid bruit, no JVD, supple, symmetrical, trachea midline and thyroid not enlarged, symmetric, no tenderness/mass/nodules Lungs: clear to auscultation bilaterally Heart: regular rate and rhythm, S1, S2 normal, no murmur, click, rub or gallop Extremities: extremities normal, atraumatic, no cyanosis or edema  EKG sinus rhythm at 78 without ST or T-wave changes. I personally reviewed this EKG  ASSESSMENT AND PLAN:   Essential hypertension History of hypertension blood pressure measured 138/94. She is on carvedilol, hydrochlorothiazide and lisinopril. Continue current meds at current dosing  CAD (coronary artery disease) History of CAD with history of non-STEMI in 2400 thrombus or circumflex and moderate RCA disease. She had negative stress test in 2008 and underwent cardiac catheterization 02/20/14 by Dr. Irish Lack revealing a separate 70% mid RCA lesion, 50-60%  mid circumflex lesion and the RCA underwent FFR interrogation was measured 0.9 suggesting that this was not physiologically significant. She does get occasional nitroglycerin responsive angina which has not changed in frequency or severity.  Hyperlipidemia History of hyperlipidemia on Crestor with decreased lipid profile performed 09/22/15 revealed a total cholesterol 156, LDL 71 and HDL of Bonner MD [facility], Nohea Kras M. [facility] 03/09/2016 4:36 PM

## 2016-03-09 NOTE — Assessment & Plan Note (Addendum)
History of hypertension blood pressure measured 138/94 which fell to 140/80 by the end of our interview. She is on carvedilol, hydrochlorothiazide and lisinopril. Continue current meds at current dosing

## 2016-03-09 NOTE — Assessment & Plan Note (Signed)
History of hyperlipidemia on Crestor with decreased lipid profile performed 09/22/15 revealed a total cholesterol 156, LDL 71 and HDL of 61

## 2016-03-09 NOTE — Patient Instructions (Signed)

## 2016-03-31 ENCOUNTER — Other Ambulatory Visit: Payer: Self-pay | Admitting: Family Medicine

## 2016-03-31 DIAGNOSIS — M810 Age-related osteoporosis without current pathological fracture: Secondary | ICD-10-CM

## 2016-03-31 DIAGNOSIS — E785 Hyperlipidemia, unspecified: Secondary | ICD-10-CM

## 2016-03-31 DIAGNOSIS — I10 Essential (primary) hypertension: Secondary | ICD-10-CM

## 2016-03-31 DIAGNOSIS — Z0000 Encounter for general adult medical examination without abnormal findings: Secondary | ICD-10-CM

## 2016-03-31 DIAGNOSIS — Z79899 Other long term (current) drug therapy: Secondary | ICD-10-CM

## 2016-04-01 ENCOUNTER — Other Ambulatory Visit: Payer: Medicare Other

## 2016-04-01 DIAGNOSIS — M810 Age-related osteoporosis without current pathological fracture: Secondary | ICD-10-CM

## 2016-04-01 DIAGNOSIS — Z0000 Encounter for general adult medical examination without abnormal findings: Secondary | ICD-10-CM

## 2016-04-01 DIAGNOSIS — E785 Hyperlipidemia, unspecified: Secondary | ICD-10-CM

## 2016-04-01 DIAGNOSIS — I10 Essential (primary) hypertension: Secondary | ICD-10-CM

## 2016-04-01 DIAGNOSIS — Z79899 Other long term (current) drug therapy: Secondary | ICD-10-CM

## 2016-04-01 LAB — COMPLETE METABOLIC PANEL WITH GFR
ALBUMIN: 4 g/dL (ref 3.6–5.1)
ALK PHOS: 54 U/L (ref 33–130)
ALT: 15 U/L (ref 6–29)
AST: 19 U/L (ref 10–35)
BILIRUBIN TOTAL: 0.6 mg/dL (ref 0.2–1.2)
BUN: 19 mg/dL (ref 7–25)
CHLORIDE: 101 mmol/L (ref 98–110)
CO2: 29 mmol/L (ref 20–31)
Calcium: 9.7 mg/dL (ref 8.6–10.4)
Creat: 0.74 mg/dL (ref 0.60–0.88)
GFR, EST NON AFRICAN AMERICAN: 76 mL/min (ref >=60)
GFR, Est African American: 88 mL/min (ref >=60)
GLUCOSE: 103 mg/dL — AB (ref 70–99)
POTASSIUM: 4.3 mmol/L (ref 3.5–5.3)
SODIUM: 142 mmol/L (ref 135–146)
Total Protein: 6.9 g/dL (ref 6.1–8.1)

## 2016-04-01 LAB — CBC WITH DIFFERENTIAL/PLATELET
BASOS ABS: 58 {cells}/uL (ref 0–200)
Basophils Relative: 1 %
Eosinophils Absolute: 290 cells/uL (ref 15–500)
Eosinophils Relative: 5 %
HEMATOCRIT: 41.2 % (ref 35.0–45.0)
HEMOGLOBIN: 13.7 g/dL (ref 12.0–15.0)
LYMPHS ABS: 1566 {cells}/uL (ref 850–3900)
LYMPHS PCT: 27 %
MCH: 32.4 pg (ref 27.0–33.0)
MCHC: 33.3 g/dL (ref 32.0–36.0)
MCV: 97.4 fL (ref 80.0–100.0)
MONO ABS: 406 {cells}/uL (ref 200–950)
MPV: 9.8 fL (ref 7.5–12.5)
Monocytes Relative: 7 %
NEUTROS PCT: 60 %
Neutro Abs: 3480 cells/uL (ref 1500–7800)
Platelets: 212 10*3/uL (ref 140–400)
RBC: 4.23 MIL/uL (ref 3.80–5.10)
RDW: 13.5 % (ref 11.0–15.0)
WBC: 5.8 10*3/uL (ref 3.8–10.8)

## 2016-04-01 LAB — LIPID PANEL
CHOL/HDL RATIO: 3 ratio (ref <=5.0)
Cholesterol: 164 mg/dL (ref 125–200)
HDL: 55 mg/dL (ref >=46)
LDL Cholesterol: 76 mg/dL (ref <130)
TRIGLYCERIDES: 165 mg/dL — AB (ref <150)
VLDL: 33 mg/dL — ABNORMAL HIGH (ref <30)

## 2016-04-02 LAB — VITAMIN D 25 HYDROXY (VIT D DEFICIENCY, FRACTURES): Vit D, 25-Hydroxy: 53 ng/mL (ref 30–100)

## 2016-04-05 ENCOUNTER — Encounter: Payer: Self-pay | Admitting: Family Medicine

## 2016-04-05 ENCOUNTER — Ambulatory Visit (INDEPENDENT_AMBULATORY_CARE_PROVIDER_SITE_OTHER): Payer: Medicare Other | Admitting: Family Medicine

## 2016-04-05 VITALS — BP 118/68 | HR 68 | Temp 97.9°F | Resp 16 | Ht 67.0 in | Wt 184.0 lb

## 2016-04-05 DIAGNOSIS — Z0000 Encounter for general adult medical examination without abnormal findings: Secondary | ICD-10-CM | POA: Diagnosis not present

## 2016-04-05 DIAGNOSIS — E785 Hyperlipidemia, unspecified: Secondary | ICD-10-CM | POA: Diagnosis not present

## 2016-04-05 DIAGNOSIS — I2510 Atherosclerotic heart disease of native coronary artery without angina pectoris: Secondary | ICD-10-CM

## 2016-04-05 DIAGNOSIS — E042 Nontoxic multinodular goiter: Secondary | ICD-10-CM

## 2016-04-05 DIAGNOSIS — R739 Hyperglycemia, unspecified: Secondary | ICD-10-CM | POA: Diagnosis not present

## 2016-04-05 DIAGNOSIS — I10 Essential (primary) hypertension: Secondary | ICD-10-CM | POA: Diagnosis not present

## 2016-04-05 MED ORDER — CLOPIDOGREL BISULFATE 75 MG PO TABS: 75.0000 mg | ORAL_TABLET | Freq: Every day | ORAL | Status: DC

## 2016-04-05 MED ORDER — HYDROCODONE-ACETAMINOPHEN 5-325 MG PO TABS: 1.0000 | ORAL_TABLET | Freq: Four times a day (QID) | ORAL | Status: DC | PRN

## 2016-04-05 MED ORDER — ROSUVASTATIN CALCIUM 20 MG PO TABS: 20.0000 mg | ORAL_TABLET | Freq: Every day | ORAL | Status: DC

## 2016-04-05 MED ORDER — NITROGLYCERIN 0.4 MG SL SUBL
0.4000 mg | SUBLINGUAL_TABLET | SUBLINGUAL | Status: DC | PRN
Start: 2016-04-05 — End: 2016-05-18

## 2016-04-05 NOTE — Progress Notes (Signed)
Subjective:    Patient ID: Lindsey Erickson, female    DOB: 11/28/1931, 82 y.o.   MRN: [id number]  HPI  Patient is a very pleasant 82 year old white female who is here for CPE. She has a significant past medical history of coronary artery disease. According to her report she is suffered 2 separate myocardial infarctions. However on repeat catheterizations, the patient states that she has lesions which are not amenable to stenting. Therefore she is being managed medically. She is currently on a long-acting nitroglycerin, beta blocker, and ACE inhibitor, statin medication, and dual antiplatelet therapy. She follows up regularly with her cardiologist.  She is currently on Synthroid to help suppress the growth of a quarter. She denies any recent growth. This is followed by Dr. Dwyane Dee. Immunization History  Administered Date(s) Administered  . Influenza Whole 07/23/2009  . Influenza,inj,Quad PF,36+ Mos 09/22/2015  . Influenza-Unspecified 08/22/2013, 09/02/2014  . Pneumococcal Conjugate-13 01/14/2014  . Pneumococcal Polysaccharide-23 02/21/2012  . Zoster 09/23/2006    Past Medical History  Diagnosis Date  . Hypothyroidism   . Kidney stones   . Osteoporosis     no fractures  . Hyperlipidemia   . Arthritis   . HYPERTENSION     a. Also has h/o intermittentl low BP.  Marland Kitchen CAD (coronary artery disease)     a. NSTEMI 2004 with thrombus in Cx and mod RCA. b. Nonischemic nuc 2008. c. mild NSTEMI 01/2013 (trop 0.42) -> cath with unchanged mod LAD & RCA disease, for continued medical therapy.  . Recurrent urinary tract infection   . H/O Hashimoto thyroiditis   . QT prolongation     a. Prior EKGs QTc 474, but on 4/1 was 524 for unclear reason.  . Anemia   . Myocardial infarction ([place])   . Cancer ([place])   . Blood transfusion without reported diagnosis 1957  . Cataract   . Squamous cell carcinoma of skin 04/17/2015   Past Surgical History  Procedure Laterality Date  . Appendectomy  1999  .  Cholecystectomy  02/2010  . Bilateral arthroscopies of knees    . Joint replacement  01/2010    L TKR - allusio  . Cardiac catheterization      Moderate 3 vessel CAD by cath 05/2012  . Left heart catheterization with coronary angiogram N/A 05/22/2012    Procedure: LEFT HEART CATHETERIZATION WITH CORONARY ANGIOGRAM;  Surgeon: Sinclair Grooms, MD;  Location: [facility] CATH LAB;  Service: Cardiovascular;  Laterality: N/A;  . Left heart catheterization with coronary angiogram N/A 02/20/2014    Procedure: LEFT HEART CATHETERIZATION WITH CORONARY ANGIOGRAM;  Surgeon: Peter M Martinique, MD;  Location: [facility] CATH LAB;  Service: Cardiovascular;  Laterality: N/A;  . Tubal ligation     Current Outpatient Prescriptions on File Prior to Visit  Medication Sig Dispense Refill  . acetaminophen (TYLENOL) 500 MG tablet Take 500 mg by mouth every 6 (six) hours as needed for moderate pain.    . Ascorbic Acid (VITAMIN C) 500 MG tablet Take 1,000 mg by mouth 2 (two) times daily.     Marland Kitchen aspirin 81 MG tablet Take 81 mg by mouth daily.      . carvedilol (COREG CR) 40 MG 24 hr capsule Take 1 capsule (40 mg total) by mouth every evening. 90 capsule 3  . Cholecalciferol (VITAMIN D3 PO) Take 1 tablet by mouth daily.    . clindamycin (CLINDAGEL) 1 % gel Apply 1 application topically daily.    . hydrochlorothiazide (MICROZIDE) 12.5 MG  capsule Take 12.5 mg by mouth daily. Take 1 tab Monday, Wednesday and Friday None on other days    . isosorbide mononitrate (IMDUR) 60 MG 24 hr tablet Take 1 tablet (60 mg total) by mouth daily. 90 tablet 3  . lisinopril (PRINIVIL,ZESTRIL) 40 MG tablet Take 1 tablet (40 mg total) by mouth daily. 90 tablet 3  . Methylsulfonylmethane 1000 MG CAPS Take 1,000 mg by mouth 2 (two) times daily.     . Multiple Vitamins-Minerals (CENTRUM SILVER ADULT 50+ PO) Take 1 tablet by mouth daily.    Marland Kitchen SYNTHROID 50 MCG tablet Take 1 tablet (50 mcg total) by mouth daily before breakfast. DAW (Patient taking differently: Take 50  mcg by mouth daily before breakfast. DAW; take 1.5 tabs(5mg) on Monday, Wednesday and Friday and 1 tab on Tuesday, Thursday, Saturday and Sunday) 90 tablet 3   No current facility-administered medications on file prior to visit.   Allergies  Allergen Reactions  . 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   ALL NARCOTICS- NAUSEA AND VOMITING   . Adhesive [Tape] Itching, Swelling, Rash and Other (See Comments)    Also pain at location  . Codeine Nausea And Vomiting  . Bactrim [Sulfamethoxazole-Trimethoprim]     Muscle aches  . Cephalexin Hives  . Macrobid [Nitrofurantoin Monohyd Macro] Nausea And Vomiting   Social History   Social History  . Marital Status: Married    Spouse Name: N/A  . Number of Children: N/A  . Years of Education: N/A   Occupational History  . Not on file.   Social History Main Topics  . Smoking status: Never Smoker   . Smokeless tobacco: Never Used  . Alcohol Use: No  . Drug Use: No  . Sexual Activity: Not Currently   Other Topics Concern  . Not on file   Social History Narrative   Married, 2 children. Retired from [employer]  Family History  Problem Relation Age of Onset  . Allergies Father   . Cancer Father   . Coronary artery disease Mother 727 . Heart disease Mother   . CAD Sister 693 . Cancer Sister   . AAA (abdominal aortic aneurysm) Sister   . Hypertension Sister   . Hyperlipidemia Sister   . Diabetes Sister   . Heart disease Sister   . Diabetes Maternal Aunt       Review of Systems  All other systems reviewed and are negative.      Objective:   Physical Exam  Constitutional: She is oriented to person, place, and time. She appears well-developed and well-nourished. No distress.  Neck: Neck supple. No JVD present. No tracheal deviation present. Thyromegaly present.  Cardiovascular: Normal rate, regular rhythm and normal heart  sounds.   No murmur heard. Pulmonary/Chest: Effort normal and breath sounds normal. No respiratory distress. She has no wheezes. She has no rales.  Abdominal: Soft. Bowel sounds are normal. She exhibits no distension. There is no tenderness. There is no rebound and no guarding.  Musculoskeletal: She exhibits no edema.  Lymphadenopathy:    She has no cervical adenopathy.  Neurological: She is alert and oriented to person, place, and time. She has normal reflexes. No cranial nerve deficit. She exhibits normal muscle tone. Coordination normal.  Skin: She is not diaphoretic.  Vitals reviewed.    Appointment on 04/01/2016  Component Date Value Ref Range Status  .  Sodium 04/01/2016 142  135 - 146 mmol/L Final  . Potassium 04/01/2016 4.3  3.5 - 5.3 mmol/L Final  . Chloride 04/01/2016 101  98 - 110 mmol/L Final  . CO2 04/01/2016 29  20 - 31 mmol/L Final  . Glucose, Bld 04/01/2016 103* 70 - 99 mg/dL Final  . BUN 04/01/2016 19  7 - 25 mg/dL Final  . Creat 04/01/2016 0.74  0.60 - 0.88 mg/dL Final  . Total Bilirubin 04/01/2016 0.6  0.2 - 1.2 mg/dL Final  . Alkaline Phosphatase 04/01/2016 54  33 - 130 U/L Final  . AST 04/01/2016 19  10 - 35 U/L Final  . ALT 04/01/2016 15  6 - 29 U/L Final  . Total Protein 04/01/2016 6.9  6.1 - 8.1 g/dL Final  . Albumin 04/01/2016 4.0  3.6 - 5.1 g/dL Final  . Calcium 04/01/2016 9.7  8.6 - 10.4 mg/dL Final  . GFR, Est African American 04/01/2016 88  >=60 mL/min Final  . GFR, Est Non African American 04/01/2016 76  >=60 mL/min Final   Comment:   The estimated GFR is a calculation valid for adults (>=77 years old) that uses the CKD-EPI algorithm to adjust for age and sex. It is   not to be used for children, pregnant women, hospitalized patients,    patients on dialysis, or with rapidly changing kidney function. According to the NKDEP, eGFR >89 is normal, 60-89 shows mild impairment, 30-59 shows moderate impairment, 15-29 shows severe impairment and <15 is  ESRD.     Marland Kitchen Cholesterol 04/01/2016 164  125 - 200 mg/dL Final  . Triglycerides 04/01/2016 165* <150 mg/dL Final  . HDL 04/01/2016 55  >=46 mg/dL Final  . Total CHOL/HDL Ratio 04/01/2016 3.0  <=5.0 Ratio Final  . VLDL 04/01/2016 33* <30 mg/dL Final  . LDL Cholesterol 04/01/2016 76  <130 mg/dL Final   Comment:   Total Cholesterol/HDL Ratio:CHD Risk                        Coronary Heart Disease Risk Table                                        Men       Women          1/2 Average Risk              3.4        3.3              Average Risk              5.0        4.4           2X Average Risk              9.6        7.1           3X Average Risk             23.4       11.0 Use the calculated Patient Ratio above and the CHD Risk table  to determine the patient's CHD Risk.   . WBC 04/01/2016 5.8  3.8 - 10.8 K/uL Final  . RBC 04/01/2016 4.23  3.80 - 5.10 MIL/uL Final  . Hemoglobin 04/01/2016 13.7  12.0 - 15.0 g/dL Final  . HCT 04/01/2016 41.2  35.0 - 45.0 % Final  . MCV 04/01/2016 97.4  80.0 - 100.0 fL Final  . MCH 04/01/2016 32.4  27.0 - 33.0 pg Final  . MCHC 04/01/2016 33.3  32.0 - 36.0 g/dL Final  . RDW 04/01/2016 13.5  11.0 - 15.0 % Final  . Platelets 04/01/2016 212  140 - 400 K/uL Final  . MPV 04/01/2016 9.8  7.5 - 12.5 fL Final  . Neutro Abs 04/01/2016 3480  1500 - 7800 cells/uL Final  . Lymphs Abs 04/01/2016 1566  850 - 3900 cells/uL Final  . Monocytes Absolute 04/01/2016 406  200 - 950 cells/uL Final  . Eosinophils Absolute 04/01/2016 290  15 - 500 cells/uL Final  . Basophils Absolute 04/01/2016 58  0 - 200 cells/uL Final  . Neutrophils Relative % 04/01/2016 60   Final  . Lymphocytes Relative 04/01/2016 27   Final  . Monocytes Relative 04/01/2016 7   Final  . Eosinophils Relative 04/01/2016 5   Final  . Basophils Relative 04/01/2016 1   Final  . Smear Review 04/01/2016 Criteria for review not met   Final   ** Please note change in unit of measure and reference range(s). **   . Vit D, 25-Hydroxy 04/01/2016 53  30 - 100 ng/mL Final   Comment: Vitamin D Status           25-OH Vitamin D        Deficiency                <20 ng/mL        Insufficiency         20 - 29 ng/mL        Optimal             > or = 30 ng/mL   For 25-OH Vitamin D testing on patients on D2-supplementation and patients for whom quantitation of D2 and D3 fractions is required, the [facility] 25-OH VIT D, (D2,D3), LC/MS/MS is recommended: order code [id number] (patients > 2 yrs).         Assessment & Plan:  Gen med exam Patient's lab work is outstanding. Her LDL cholesterol is slightly high at 76 however I do not believe it is worthwhile to change medication to address a 6 point elevation. Immunizations are up-to-date. I would not recommend colonoscopy given her age and her ASCVD. She has a mammogram scheduled next week. Due to her age, she does not require a Pap smear. She sees endocrinology to monitor her goiter. Cancer screening is up-to-date. I would repeat a bone density test next year. At that point she will been off bisphosphonates for 2 years. Should her T score drop significantly, we will likely resume Actonel.

## 2016-04-07 ENCOUNTER — Ambulatory Visit
Admission: RE | Admit: 2016-04-07 | Discharge: 2016-04-07 | Disposition: A | Discharge status: Home / Self Care | Payer: Medicare Other | Source: Ambulatory Visit

## 2016-04-07 DIAGNOSIS — Z1231 Encounter for screening mammogram for malignant neoplasm of breast: Secondary | ICD-10-CM

## 2016-04-09 ENCOUNTER — Encounter: Payer: Self-pay | Admitting: Family Medicine

## 2016-04-10 ENCOUNTER — Encounter: Payer: Self-pay | Admitting: Family Medicine

## 2016-04-13 ENCOUNTER — Other Ambulatory Visit (INDEPENDENT_AMBULATORY_CARE_PROVIDER_SITE_OTHER): Payer: Medicare Other

## 2016-04-13 DIAGNOSIS — R7301 Impaired fasting glucose: Secondary | ICD-10-CM

## 2016-04-13 DIAGNOSIS — E042 Nontoxic multinodular goiter: Secondary | ICD-10-CM

## 2016-04-13 LAB — TSH: TSH: 1.7 u[IU]/mL (ref 0.35–4.50)

## 2016-04-13 LAB — BASIC METABOLIC PANEL
BUN: 19 mg/dL (ref 6–23)
CHLORIDE: 104 meq/L (ref 96–112)
CO2: 30 meq/L (ref 19–32)
CREATININE: 0.7 mg/dL (ref 0.40–1.20)
Calcium: 9.5 mg/dL (ref 8.4–10.5)
GFR: 85.21 mL/min (ref >60.00)
Glucose, Bld: 104 mg/dL — ABNORMAL HIGH (ref 70–99)
POTASSIUM: 3.8 meq/L (ref 3.5–5.1)
SODIUM: 141 meq/L (ref 135–145)

## 2016-04-13 LAB — T3, FREE: T3, Free: 2.6 pg/mL (ref 2.3–4.2)

## 2016-04-13 LAB — T4, FREE: Free T4: 0.94 ng/dL (ref 0.60–1.60)

## 2016-04-13 LAB — HEMOGLOBIN A1C: HEMOGLOBIN A1C: 6.2 % (ref 4.6–6.5)

## 2016-04-20 ENCOUNTER — Other Ambulatory Visit: Payer: Medicare Other

## 2016-04-23 ENCOUNTER — Encounter: Payer: Self-pay | Admitting: Endocrinology

## 2016-04-23 ENCOUNTER — Ambulatory Visit (INDEPENDENT_AMBULATORY_CARE_PROVIDER_SITE_OTHER): Payer: Medicare Other | Admitting: Endocrinology

## 2016-04-23 VITALS — BP 134/78 | HR 68 | Temp 97.7°F | Resp 16 | Ht 67.0 in | Wt 183.0 lb

## 2016-04-23 DIAGNOSIS — R7301 Impaired fasting glucose: Secondary | ICD-10-CM | POA: Diagnosis not present

## 2016-04-23 DIAGNOSIS — E042 Nontoxic multinodular goiter: Secondary | ICD-10-CM

## 2016-04-23 DIAGNOSIS — I2510 Atherosclerotic heart disease of native coronary artery without angina pectoris: Secondary | ICD-10-CM | POA: Diagnosis not present

## 2016-04-23 NOTE — Progress Notes (Signed)
Patient ID: Lindsey Erickson, female   DOB: 1931/12/31, 82 y.o.   MRN: [id number]   Reason for Appointment:  Thyroid followup     History of Present Illness:   She initially had a multinodular goiter in 1982 She has had 3 biopsies of her thyroid nodules subsequently with the last one in 2003 which were benign Her biopsies had shown a few follicular cells and lymphocytes, not clear which nodule was biopsied  ? Hypothyroidism:  Most likely she has been on thyroid suppression for the goiter rather than true hypothyroidism although previous records do indicate Hashimoto thyroiditis  The treatments that the patient  had taken include Synthroid 88 mcg for several years    This was reduced to 50 g in 11/16 because of low normal TSH of 0.36.           Because of her symptoms of feeling a little tired and somewhat cold she is now taking an additional half tablet 3 times a week Does not complain of any significant fatigue  Compliance with the medical regimen has been as prescribed with taking the tablet in the morning before breakfast.  TSH  Is unchanged   Lab Results  Component Value Date   TSH 1.70 04/13/2016   TSH 1.65 12/22/2015   TSH 0.36 10/02/2015   FREET4 0.94 04/13/2016   FREET4 0.91 12/22/2015   FREET4 0.98 10/02/2015    GOITER: She is not having local pressure or difficulty swallowing She was asked by her PCP if she needed another follow-up ultrasound   PREDIABETES:   Her last fasting glucose was higher at 112 in 11/16 Has not had a glucose tolerance test However A1c has been consistently in the normal range and fasting glucose is 104, stable A1c is is still in the diabetic range of 6.2   Wt Readings from Last 3 Encounters:  04/23/16 183 lb (83.008 kg)  04/05/16 184 lb (83.462 kg)  03/09/16 183 lb (83.008 kg)    Lab Results  Component Value Date   HGBA1C 6.2 04/13/2016   HGBA1C 5.9* 03/24/2015   HGBA1C 6.1 07/08/2014   Lab Results  Component  Value Date   LDLCALC 76 04/01/2016   CREATININE 0.70 04/13/2016       Medication List       This list is accurate as of: 04/23/16 11:59 PM.  Always use your most recent med list.               acetaminophen 500 MG tablet  Commonly known as:  TYLENOL  Take 500 mg by mouth every 6 (six) hours as needed for moderate pain.     aspirin 81 MG tablet  Take 81 mg by mouth daily.     carvedilol 40 MG 24 hr capsule  Commonly known as:  COREG CR  Take 1 capsule (40 mg total) by mouth every evening.     CENTRUM SILVER ADULT 50+ PO  Take 1 tablet by mouth daily.     clindamycin 1 % gel  Commonly known as:  CLINDAGEL  Apply 1 application topically daily.     clopidogrel 75 MG tablet  Commonly known as:  PLAVIX  Take 1 tablet (75 mg total) by mouth daily.     hydrochlorothiazide 12.5 MG capsule  Commonly known as:  MICROZIDE  Take 12.5 mg by mouth daily. Take 1 tab Monday, Wednesday and Friday None on other days     HYDROcodone-acetaminophen 5-325 MG tablet  Commonly known as:  NORCO  Take 1-2 tablets by mouth every 6 (six) hours as needed for moderate pain.     isosorbide mononitrate 60 MG 24 hr tablet  Commonly known as:  IMDUR  Take 1 tablet (60 mg total) by mouth daily.     lisinopril 40 MG tablet  Commonly known as:  PRINIVIL,ZESTRIL  Take 1 tablet (40 mg total) by mouth daily.     Methylsulfonylmethane 1000 MG Caps  Take 1,000 mg by mouth 2 (two) times daily.     nitroGLYCERIN 0.4 MG SL tablet  Commonly known as:  NITROSTAT  Place 1 tablet (0.4 mg total) under the tongue every 5 (five) minutes as needed. For chest pain     rosuvastatin 20 MG tablet  Commonly known as:  CRESTOR  Take 1 tablet (20 mg total) by mouth at bedtime.     SYNTHROID 50 MCG tablet  Generic drug:  levothyroxine  Take 1 tablet (50 mcg total) by mouth daily before breakfast. DAW     vitamin C 500 MG tablet  Commonly known as:  ASCORBIC ACID  Take 1,000 mg by mouth 2 (two) times daily.      VITAMIN D3 PO  Take 1 tablet by mouth daily.        Allergies:  Allergies  Allergen Reactions  . 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   ALL NARCOTICS- NAUSEA AND VOMITING   . Adhesive [Tape] Itching, Swelling, Rash and Other (See Comments)    Also pain at location  . Codeine Nausea And Vomiting  . Bactrim [Sulfamethoxazole-Trimethoprim]     Muscle aches  . Cephalexin Hives  . Macrobid [Nitrofurantoin Monohyd Macro] Nausea And Vomiting    Past Medical History  Diagnosis Date  . Hypothyroidism   . Kidney stones   . Osteoporosis     no fractures  . Hyperlipidemia   . Arthritis   . HYPERTENSION     a. Also has h/o intermittentl low BP.  Marland Kitchen CAD (coronary artery disease)     a. NSTEMI 2004 with thrombus in Cx and mod RCA. b. Nonischemic nuc 2008. c. mild NSTEMI 01/2013 (trop 0.42) -> cath with unchanged mod LAD & RCA disease, for continued medical therapy.  . Recurrent urinary tract infection   . H/O Hashimoto thyroiditis   . QT prolongation     a. Prior EKGs QTc 474, but on 4/1 was 524 for unclear reason.  . Anemia   . Myocardial infarction ([place])   . Cancer ([place])   . Blood transfusion without reported diagnosis 1957  . Cataract   . Squamous cell carcinoma of skin 04/17/2015    Past Surgical History  Procedure Laterality Date  . Appendectomy  1999  . Cholecystectomy  02/2010  . Bilateral arthroscopies of knees    . Joint replacement  01/2010    L TKR - allusio  . Cardiac catheterization      Moderate 3 vessel CAD by cath 05/2012  . Left heart catheterization with coronary angiogram N/A 05/22/2012    Procedure: LEFT HEART CATHETERIZATION WITH CORONARY ANGIOGRAM;  Surgeon: Sinclair Grooms, MD;  Location: [facility] CATH LAB;  Service: Cardiovascular;  Laterality: N/A;  . Left heart catheterization with coronary angiogram N/A 02/20/2014    Procedure: LEFT HEART CATHETERIZATION WITH CORONARY  ANGIOGRAM;  Surgeon: Peter M Martinique, MD;  Location: [facility] CATH LAB;  Service: Cardiovascular;  Laterality: N/A;  .  Tubal ligation      Family History  Problem Relation Age of Onset  . Allergies Father   . Cancer Father   . Coronary artery disease Mother 74  . Heart disease Mother   . CAD Sister 8  . Cancer Sister   . AAA (abdominal aortic aneurysm) Sister   . Hypertension Sister   . Hyperlipidemia Sister   . Diabetes Sister   . Heart disease Sister   . Diabetes Maternal Aunt     Social History:  reports that she has never smoked. She has never used smokeless tobacco. She reports that she does not drink alcohol or use illicit drugs.  REVIEW Of SYSTEMS:   As of 2015 bone density she has no osteoporosis  History of CAD, followed regularly by cardiologist  Hypertension: Is well-controlled on a 2 drug regimen   Examination:   BP 134/78 mmHg  Pulse 68  Temp(Src) 97.7 F (36.5 C)  Resp 16  Ht 5\' 7"  (1.702 m)  Wt 183 lb (83.008 kg)  BMI 28.66 kg/m2  SpO2 97%    The neck circumference is 41cm She has a goiter with an twice normal, slightly firm and smooth Her left lobe his more firm and not as prominent, no distinct nodules    Assessments   Multinodular goiter, clinically stable with no local pressure symptoms Examination findings are similar to previous exams except the right lobe appears somewhat smaller Reassured her that since her left lobe does not appear to be enlarged and the exam is unchanged she does not need another ultrasound, she has had a goiter likely for the last 35 years or more  ? Coexisting hypothyroidism and Hashimoto thyroiditis.   She is taking small doses of levothyroxine for supplementation and is subjectively doing well TSH is normal and will continue the same dose of 50 g, 8-1/2 tablets a week  Impaired fasting glucose:  This is stable and A1c is 6.2, adequate for her age Fasting glucose 104 and.any higher She has been able to maintain her  weight   Hoang Reich 04/25/2016, 9:11 PM

## 2016-05-18 ENCOUNTER — Other Ambulatory Visit: Payer: Self-pay | Admitting: Family Medicine

## 2016-05-26 ENCOUNTER — Telehealth: Payer: Self-pay | Admitting: Endocrinology

## 2016-05-26 ENCOUNTER — Other Ambulatory Visit: Payer: Self-pay | Admitting: Family Medicine

## 2016-05-26 MED ORDER — CLOPIDOGREL BISULFATE 75 MG PO TABS: 75.0000 mg | ORAL_TABLET | Freq: Every day | ORAL | Status: DC

## 2016-05-26 MED ORDER — NITROGLYCERIN 0.4 MG SL SUBL: SUBLINGUAL_TABLET | SUBLINGUAL | Status: DC

## 2016-05-26 MED ORDER — ISOSORBIDE MONONITRATE ER 60 MG PO TB24: 60.0000 mg | ORAL_TABLET | Freq: Every day | ORAL | Status: DC

## 2016-05-26 NOTE — Telephone Encounter (Signed)
Medication called/sent to requested pharmacy  

## 2016-05-26 NOTE — Telephone Encounter (Signed)
[facility] needs [facility] for synthyroid brand name only, the last rx was for 50 mg, 90 day supply and 3 refills  He told her to take 1 half extra pill on Monday, Wednesday, and Friday.  Please call pt when done

## 2016-05-27 MED ORDER — SYNTHROID 50 MCG PO TABS: ORAL_TABLET | ORAL | Status: DC

## 2016-05-27 NOTE — Addendum Note (Signed)
Addended by: Verlin Grills T on: 05/27/2016 08:03 AM   Modules accepted: Orders

## 2016-05-27 NOTE — Telephone Encounter (Signed)
I contacted the pt and left a vm advising medication has been submitted.

## 2016-07-02 ENCOUNTER — Ambulatory Visit (INDEPENDENT_AMBULATORY_CARE_PROVIDER_SITE_OTHER): Payer: Medicare Other | Admitting: Family Medicine

## 2016-07-02 ENCOUNTER — Encounter: Payer: Self-pay | Admitting: Family Medicine

## 2016-07-02 VITALS — BP 136/74 | HR 74 | Temp 98.3°F | Resp 16 | Ht 67.0 in | Wt 185.0 lb

## 2016-07-02 DIAGNOSIS — H6981 Other specified disorders of Eustachian tube, right ear: Secondary | ICD-10-CM

## 2016-07-02 DIAGNOSIS — I2510 Atherosclerotic heart disease of native coronary artery without angina pectoris: Secondary | ICD-10-CM | POA: Diagnosis not present

## 2016-07-02 MED ORDER — FLUTICASONE PROPIONATE 50 MCG/ACT NA SUSP: 2.0000 | Freq: Every day | NASAL | 6 refills | Status: DC

## 2016-07-02 NOTE — Progress Notes (Signed)
Subjective:    Patient ID: Lindsey Erickson, female    DOB: 04/29/1932, 82 y.o.   MRN: [id number]  HPI Patient states that her ear feels like it's full of water.  She has been trying to rinse it out with alcohol and peroxide without success. She denies any dizziness. She denies any tinnitus. She denies any hearing loss. On examination today, both auditory canals are completely clear of wax or foreign body. Eardrum itself appears healthy. There is no visible middle ear effusion. Patient has been complaining of sinus congestion. Past Medical History:  Diagnosis Date  . Anemia   . Arthritis   . Blood transfusion without reported diagnosis 1957  . CAD (coronary artery disease)    a. NSTEMI 2004 with thrombus in Cx and mod RCA. b. Nonischemic nuc 2008. c. mild NSTEMI 01/2013 (trop 0.42) -> cath with unchanged mod LAD & RCA disease, for continued medical therapy.  . Cancer ([place])   . Cataract   . H/O Hashimoto thyroiditis   . Hyperlipidemia   . HYPERTENSION    a. Also has h/o intermittentl low BP.  Marland Kitchen Hypothyroidism   . Kidney stones   . Myocardial infarction ([place])   . Osteoporosis    no fractures  . QT prolongation    a. Prior EKGs QTc 474, but on 4/1 was 524 for unclear reason.  . Recurrent urinary tract infection   . Squamous cell carcinoma of skin 04/17/2015   Past Surgical History:  Procedure Laterality Date  . APPENDECTOMY  1999  . Bilateral Arthroscopies of knees    . CARDIAC CATHETERIZATION     Moderate 3 vessel CAD by cath 05/2012  . CHOLECYSTECTOMY  02/2010  . JOINT REPLACEMENT  01/2010   L TKR - allusio  . LEFT HEART CATHETERIZATION WITH CORONARY ANGIOGRAM N/A 05/22/2012   Procedure: LEFT HEART CATHETERIZATION WITH CORONARY ANGIOGRAM;  Surgeon: Sinclair Grooms, MD;  Location: [facility] CATH LAB;  Service: Cardiovascular;  Laterality: N/A;  . LEFT HEART CATHETERIZATION WITH CORONARY ANGIOGRAM N/A 02/20/2014   Procedure: LEFT HEART CATHETERIZATION WITH CORONARY ANGIOGRAM;  Surgeon:  Peter M Martinique, MD;  Location: [facility] C[facility] LAB;  Service: Cardiovascular;  Laterality: N/A;  . TUBAL LIGATION     Current Outpatient Prescriptions on File Prior to Visit  Medication Sig Dispense Refill  . acetaminophen (TYLENOL) 500 MG tablet Take 500 mg by mouth every 6 (six) hours as needed for moderate pain.    . Ascorbic Acid (VITAMIN C) 500 MG tablet Take 1,000 mg by mouth 2 (two) times daily.     Marland Kitchen aspirin 81 MG tablet Take 81 mg by mouth daily.      . carvedilol (COREG CR) 40 MG 24 hr capsule Take 1 capsule (40 mg total) by mouth every evening. 90 capsule 3  . Cholecalciferol (VITAMIN D3 PO) Take 1 tablet by mouth daily.    . clindamycin (CLINDAGEL) 1 % gel Apply 1 application topically daily.    . clopidogrel (PLAVIX) 75 MG tablet Take 1 tablet (75 mg total) by mouth daily. 90 tablet 3  . hydrochlorothiazide (MICROZIDE) 12.5 MG capsule Take 12.5 mg by mouth daily. Take 1 tab Monday, Wednesday and Friday None on other days    . HYDROcodone-acetaminophen (NORCO) 5-325 MG tablet Take 1-2 tablets by mouth every 6 (six) hours as needed for moderate pain. 30 tablet 0  . isosorbide mononitrate (IMDUR) 60 MG 24 hr tablet Take 1 tablet (60 mg total) by mouth daily. 90 tablet 3  .  lisinopril (PRINIVIL,ZESTRIL) 40 MG tablet Take 1 tablet (40 mg total) by mouth daily. 90 tablet 3  . Methylsulfonylmethane 1000 MG CAPS Take 1,000 mg by mouth 2 (two) times daily.     . Multiple Vitamins-Minerals (CENTRUM SILVER ADULT 50+ PO) Take 1 tablet by mouth daily.    . nitroGLYCERIN (NITROSTAT) 0.4 MG SL tablet DISSOLVE 1 TABLET UNDER THE TONGUE EVERY 5 MINUTES AS NEEDED FOR CHEST PAIN 50 tablet 1  . rosuvastatin (CRESTOR) 20 MG tablet Take 1 tablet (20 mg total) by mouth at bedtime. 90 tablet 3  . SYNTHROID 50 MCG tablet DAW; take 1.5 tabs(36mcg) on Monday, Wednesday and Friday and 1 tab on Tuesday, Thursday, Saturday and Sunday 102 tablet 3   No current facility-administered medications on file prior to visit.      Allergies  Allergen Reactions  . 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   ALL NARCOTICS- NAUSEA AND VOMITING   . Adhesive [Tape] Itching, Swelling, Rash and Other (See Comments)    Also pain at location  . Codeine Nausea And Vomiting  . Bactrim [Sulfamethoxazole-Trimethoprim]     Muscle aches  . Cephalexin Hives  . Macrobid [Nitrofurantoin Monohyd Macro] Nausea And Vomiting   Social History   Social History  . Marital status: Married    Spouse name: N/A  . Number of children: N/A  . Years of education: N/A   Occupational History  . Not on file.   Social History Main Topics  . Smoking status: Never Smoker  . Smokeless tobacco: Never Used  . Alcohol use No  . Drug use: No  . Sexual activity: Not Currently   Other Topics Concern  . Not on file   Social History Narrative   Married, 2 children. Retired from [employer] other systems reviewed and are negative.      Objective:   Physical Exam  Constitutional: She appears well-developed and well-nourished. No distress.  HENT:  Right Ear: Tympanic membrane, external ear and ear canal normal.  Left Ear: Tympanic membrane, external ear and ear canal normal.  Nose: Nose normal.  Mouth/Throat: Oropharynx is clear and moist. No oropharyngeal exudate.  Eyes: Conjunctivae are normal.  Neck: Neck supple.  Cardiovascular: Normal rate, regular rhythm and normal heart sounds.   Pulmonary/Chest: Effort normal and breath sounds normal.  Skin: She is not diaphoretic.  Vitals reviewed.         Assessment & Plan:  Eustachian tube dysfunction, right - Plan: fluticasone (FLONASE) 50 MCG/ACT nasal spray  I believe this is most likely eustachian tube dysfunction. Begin Flonase 2 sprays each nostril twice daily. Consider an ENT consultation for tympanostomy tubes if symptoms persist longer than 3 weeks

## 2016-07-16 ENCOUNTER — Ambulatory Visit: Payer: Medicare Other | Admitting: Family Medicine

## 2016-07-21 ENCOUNTER — Other Ambulatory Visit: Payer: Self-pay

## 2016-08-17 ENCOUNTER — Ambulatory Visit (INDEPENDENT_AMBULATORY_CARE_PROVIDER_SITE_OTHER): Payer: Medicare Other

## 2016-08-17 DIAGNOSIS — Z23 Encounter for immunization: Secondary | ICD-10-CM | POA: Diagnosis not present

## 2016-09-02 ENCOUNTER — Telehealth: Payer: Self-pay | Admitting: Family Medicine

## 2016-09-02 MED ORDER — ROSUVASTATIN CALCIUM 20 MG PO TABS: 20.0000 mg | ORAL_TABLET | Freq: Every day | ORAL | 3 refills | Status: DC

## 2016-09-02 NOTE — Telephone Encounter (Signed)
Pt states that she can not take generic Crestor as it has caused her severe muscle and joint pain and once she stopped taking it her pain resolved. She has taken name brand Crestor in the past and has not had any problems with it at all. Will send over RX and await PA. If I get a chance I will call the [phone] number for express script.

## 2016-09-08 NOTE — Telephone Encounter (Signed)
Received paperwork for prior autho this am and filled out and faxed to ins company

## 2016-09-09 NOTE — Telephone Encounter (Signed)
Dr. Dennard Schaumann and I have called express scripts and spoke with [facility]. She took the information down for the Prior Approval that patient had tried other statins, hx of heart attacks, and where patient couldn't walk due to the generic medication. Heather came back on line and said that this PA has been denied. It was denied by a pharmacist Morton Peters.   The recommendation was that the patient try another generic by a different manufacture. I asked Nira Conn if she would call the patient since she was under then understanding that we only needed to call them and that it wouldn't need a Prior Approval. She state ts that yes she would reach out to the patient and let her know that the PA was denied.   Dr Dennard Schaumann and I also called the patient to let her know that [employer] had denied the PA and their suggestions. Dr. Dennard Schaumann has contacted [facility] on [address]. they are going to prescribe the generic for Crestor manufactured by  [employer] and will be ready for pick up.

## 2016-09-13 ENCOUNTER — Telehealth: Payer: Self-pay | Admitting: Family Medicine

## 2016-09-13 MED ORDER — ROSUVASTATIN CALCIUM 20 MG PO TABS: 20.0000 mg | ORAL_TABLET | Freq: Every day | ORAL | 3 refills | Status: DC

## 2016-09-13 NOTE — Telephone Encounter (Signed)
Follow up from Phone note on 09/02/16  Patient states that [facility] called her and gave her the information about the appeal. She states that she is willing to try the generic medication again. However she did cancel the [facility] at [facility] that Dr. Dennard Schaumann called in and would like [place] to call in another prescription to [place] not the sandoz brand. She states that she has been taken the generic that was originally called in since she knows that she needs to have medication.   CB# [phone]

## 2016-09-13 NOTE — Telephone Encounter (Signed)
Medication called/sent to requested pharmacy with note on it to do [employer] not [employer] and to contact pt if this can not be done by them at express scripts.

## 2016-10-06 ENCOUNTER — Ambulatory Visit (INDEPENDENT_AMBULATORY_CARE_PROVIDER_SITE_OTHER): Payer: Medicare Other | Admitting: Physician Assistant

## 2016-10-06 VITALS — BP 140/74 | HR 85 | Temp 98.3°F | Resp 18 | Ht 67.0 in

## 2016-10-06 DIAGNOSIS — B9689 Other specified bacterial agents as the cause of diseases classified elsewhere: Secondary | ICD-10-CM

## 2016-10-06 DIAGNOSIS — J020 Streptococcal pharyngitis: Secondary | ICD-10-CM

## 2016-10-06 DIAGNOSIS — J988 Other specified respiratory disorders: Secondary | ICD-10-CM | POA: Diagnosis not present

## 2016-10-06 DIAGNOSIS — I2510 Atherosclerotic heart disease of native coronary artery without angina pectoris: Secondary | ICD-10-CM | POA: Diagnosis not present

## 2016-10-06 LAB — STREP GROUP A AG, W/REFLEX TO CULT: STREGTOCOCCUS GROUP A AG SCREEN: NOT DETECTED

## 2016-10-06 MED ORDER — AZITHROMYCIN 250 MG PO TABS: ORAL_TABLET | ORAL | 0 refills | Status: DC

## 2016-10-06 NOTE — Progress Notes (Signed)
Patient ID: NALANIE SLOMA MRN: [id number], DOB: [birth year], 82 y.o. Date of Encounter: 10/06/2016, 10:27 AM    Chief Complaint:  Chief Complaint  Patient presents with  . Cough  . Fever  . congested  . Chills  . Sore Throat     HPI: 82 y.o. year old white female presents with above.   Says that her symptoms started Tuesday which was 8 days ago. Says that at first-- for about a week she had a lot of cough. Saturday night developed some low-grade fever ranging from 99 up to 100.8. Says now she is blowing her nose some and is coughing up thick dark phlegm. When she coughs she feels pressure and congestion up in her for head. She is taking Delsym every 12 hours and is taking Flonase. No other complaints or concerns.     Home Meds:   Outpatient Medications Prior to Visit  Medication Sig Dispense Refill  . acetaminophen (TYLENOL) 500 MG tablet Take 500 mg by mouth every 6 (six) hours as needed for moderate pain.    . Ascorbic Acid (VITAMIN C) 500 MG tablet Take 1,000 mg by mouth 2 (two) times daily.     Marland Kitchen aspirin 81 MG tablet Take 81 mg by mouth daily.      . carvedilol (COREG CR) 40 MG 24 hr capsule Take 1 capsule (40 mg total) by mouth every evening. 90 capsule 3  . Cholecalciferol (VITAMIN D3 PO) Take 1 tablet by mouth daily.    . clindamycin (CLINDAGEL) 1 % gel Apply 1 application topically daily.    . clopidogrel (PLAVIX) 75 MG tablet Take 1 tablet (75 mg total) by mouth daily. 90 tablet 3  . fluticasone (FLONASE) 50 MCG/ACT nasal spray Place 2 sprays into both nostrils daily. 16 g 6  . hydrochlorothiazide (MICROZIDE) 12.5 MG capsule Take 12.5 mg by mouth daily. Take 1 tab Monday, Wednesday and Friday None on other days    . HYDROcodone-acetaminophen (NORCO) 5-325 MG tablet Take 1-2 tablets by mouth every 6 (six) hours as needed for moderate pain. 30 tablet 0  . isosorbide mononitrate (IMDUR) 60 MG 24 hr tablet Take 1 tablet (60 mg total) by mouth daily. 90 tablet 3  .  lisinopril (PRINIVIL,ZESTRIL) 40 MG tablet Take 1 tablet (40 mg total) by mouth daily. 90 tablet 3  . Methylsulfonylmethane 1000 MG CAPS Take 1,000 mg by mouth 2 (two) times daily.     . Multiple Vitamins-Minerals (CENTRUM SILVER ADULT 50+ PO) Take 1 tablet by mouth daily.    . nitroGLYCERIN (NITROSTAT) 0.4 MG SL tablet DISSOLVE 1 TABLET UNDER THE TONGUE EVERY 5 MINUTES AS NEEDED FOR CHEST PAIN 50 tablet 1  . rosuvastatin (CRESTOR) 20 MG tablet Take 1 tablet (20 mg total) by mouth at bedtime. 90 tablet 3  . SYNTHROID 50 MCG tablet DAW; take 1.5 tabs(85mcg) on Monday, Wednesday and Friday and 1 tab on Tuesday, Thursday, Saturday and Sunday 102 tablet 3   No facility-administered medications prior to visit.     Allergies:  Allergies  Allergen Reactions  . 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   ALL NARCOTICS- NAUSEA AND VOMITING   . Adhesive [Tape] Itching, Swelling, Rash and Other (See Comments)    Also pain at location  . Codeine Nausea And Vomiting  . Bactrim [Sulfamethoxazole-Trimethoprim]     Muscle aches  . Cephalexin Hives  .  Macrobid [Nitrofurantoin Monohyd Macro] Nausea And Vomiting      Review of Systems: See HPI for pertinent ROS. All other ROS negative.    Physical Exam: Blood pressure 140/74, pulse 85, temperature 98.3 F (36.8 C), temperature source Oral, resp. rate 18, height 5\' 7"  (1.702 m), SpO2 98 %., There is no height or weight on file to calculate BMI. General: WNWD WF.  Appears in no acute distress. HEENT: Normocephalic, atraumatic, eyes without discharge, sclera non-icteric, nares are without discharge. Bilateral auditory canals clear, TM's are without perforation, pearly grey and translucent with reflective cone of light bilaterally. Oral cavity moist, posterior pharynx without exudate, erythema, peritonsillar abscess. No tenderness with percussion to frontal or maxillary  sinuses bilaterally.  Neck: Supple. No thyromegaly. No lymphadenopathy. Lungs: Clear bilaterally to auscultation without wheezes, rales, or rhonchi. Breathing is unlabored. Heart: Regular rhythm. No murmurs, rubs, or gallops. Msk:  Strength and tone normal for age. Extremities/Skin: Warm and dry. Neuro: Alert and oriented X 3. Moves all extremities spontaneously. Gait is normal. CNII-XII grossly in tact. Psych:  Responds to questions appropriately with a normal affect.   Results for orders placed or performed in visit on 10/06/16  STREP GROUP A AG, W/REFLEX TO CULT  Result Value Ref Range   SOURCE THROAT    STREGTOCOCCUS GROUP A AG SCREEN Not Detected      ASSESSMENT AND PLAN:  82 y.o. year old female with  1. Bacterial respiratory infection Reviewed that she has allergy to Bactrim Keflex and narcotics. Therefore we'll not give any prescription cough syrup that has codeine. Instead she can just continue using the Delsym as needed. She is to take the azithromycin as directed. Follow-up if symptoms do not resolve within 1 week after completion of antibiotic. - azithromycin (ZITHROMAX) 250 MG tablet; Day 1: Take 2 daily. Days 2-5: Take 1 daily.  Dispense: 6 tablet; Refill: 0  2. Streptococcal sore throat - STREP GROUP A AG, W/REFLEX TO CULT   Signed, [id number] [place], [place], [facility] 10/06/2016 10:27 AM

## 2016-10-08 LAB — CULTURE, GROUP A STREP: Organism ID, Bacteria: NORMAL

## 2016-10-19 ENCOUNTER — Ambulatory Visit (INDEPENDENT_AMBULATORY_CARE_PROVIDER_SITE_OTHER): Payer: Medicare Other | Admitting: Family Medicine

## 2016-10-19 ENCOUNTER — Encounter: Payer: Self-pay | Admitting: Family Medicine

## 2016-10-19 VITALS — BP 132/70 | HR 76 | Temp 98.4°F | Resp 16 | Ht 67.0 in | Wt 184.0 lb

## 2016-10-19 DIAGNOSIS — N3001 Acute cystitis with hematuria: Secondary | ICD-10-CM

## 2016-10-19 DIAGNOSIS — I2510 Atherosclerotic heart disease of native coronary artery without angina pectoris: Secondary | ICD-10-CM | POA: Diagnosis not present

## 2016-10-19 LAB — URINALYSIS, ROUTINE W REFLEX MICROSCOPIC
BILIRUBIN URINE: NEGATIVE
Glucose, UA: NEGATIVE
Ketones, ur: NEGATIVE
NITRITE: NEGATIVE
SPECIFIC GRAVITY, URINE: 1.02 (ref 1.001–1.035)
pH: 6 (ref 5.0–8.0)

## 2016-10-19 LAB — URINALYSIS, MICROSCOPIC ONLY
CRYSTALS: NONE SEEN [HPF]
Casts: NONE SEEN [LPF]
Yeast: NONE SEEN [HPF]

## 2016-10-19 MED ORDER — CIPROFLOXACIN HCL 500 MG PO TABS: 500.0000 mg | ORAL_TABLET | Freq: Two times a day (BID) | ORAL | 0 refills | Status: DC

## 2016-10-19 NOTE — Progress Notes (Signed)
   Subjective:    Patient ID: Lindsey Erickson, female    DOB: 05/08/1932, 82 y.o.   MRN: [id number]  Patient presents for Dysuria (x4 days- pain with urination, incomplete emptying of bladder, urinary frequency/ urgency)  Patient here with UTI symptoms For the past 4-5 days she's had pressure urgency was like she's not completely ENT in her bladder. She's not had any nausea vomiting she still has cough from her recent respiratory infection but that is improving. Her last culture was group B strep this was back in October 2016 prior to that she had Klebsiella UTI in September 2016  Review Of Systems:  GEN- denies fatigue, fever, weight loss,weakness, recent illness HEENT- denies eye drainage, change in vision, nasal discharge, CVS- denies chest pain, palpitations RESP- denies SOB, +cough, wheeze ABD- denies N/V, change in stools, abd pain GU- + dysuria, denies hematuria, dribbling, incontinence MSK- denies joint pain, muscle aches, injury Neuro- denies headache, dizziness, syncope, seizure activity       Objective:    BP 132/70 (BP Location: Left Arm, Patient Position: Sitting, Cuff Size: Large)   Pulse 76   Temp 98.4 F (36.9 C) (Oral)   Resp 16   Ht 5\' 7"  (1.702 m)   Wt 184 lb (83.5 kg) Comment: patient reported  SpO2 98%   BMI 28.82 kg/m  GEN- NAD, alert and oriented x3 HEENT- PERRL, EOMI, non injected sclera, pink conjunctiva, MMM, oropharynx clear, nasal congestion  Neck- Supple, no LAD  CVS- RRR, no murmur RESP-CTAB ABD-NABS,soft,mild suprapubic tenderness, no CVA tenderness  EXT- No edema Pulses- Radial,  2+        Assessment & Plan:      Problem List Items Addressed This Visit    None    Visit Diagnoses    Acute cystitis with hematuria    -  Primary   Treat with cipro, culture sent   Relevant Orders   Urinalysis, Routine w reflex microscopic (not at [facility]) (Completed)   Urine culture      Note: This dictation was prepared with Dragon dictation along  with smaller phrase technology. Any transcriptional errors that result from this process are unintentional.

## 2016-10-19 NOTE — Patient Instructions (Signed)
Take Cipro as prescribed F/U as needed

## 2016-10-21 LAB — URINE CULTURE: ORGANISM ID, BACTERIA: NO GROWTH

## 2016-10-22 ENCOUNTER — Other Ambulatory Visit: Payer: Self-pay | Admitting: *Deleted

## 2016-10-22 DIAGNOSIS — R300 Dysuria: Secondary | ICD-10-CM

## 2016-11-03 ENCOUNTER — Other Ambulatory Visit: Payer: Medicare Other

## 2016-11-03 ENCOUNTER — Other Ambulatory Visit (INDEPENDENT_AMBULATORY_CARE_PROVIDER_SITE_OTHER): Payer: Medicare Other

## 2016-11-03 DIAGNOSIS — R7301 Impaired fasting glucose: Secondary | ICD-10-CM

## 2016-11-03 DIAGNOSIS — R300 Dysuria: Secondary | ICD-10-CM

## 2016-11-03 DIAGNOSIS — E042 Nontoxic multinodular goiter: Secondary | ICD-10-CM | POA: Diagnosis not present

## 2016-11-03 LAB — URINALYSIS, ROUTINE W REFLEX MICROSCOPIC
Bilirubin Urine: NEGATIVE
Glucose, UA: NEGATIVE
HGB URINE DIPSTICK: NEGATIVE
KETONES UR: NEGATIVE
Leukocytes, UA: NEGATIVE
NITRITE: NEGATIVE
PROTEIN: NEGATIVE
Specific Gravity, Urine: 1.015 (ref 1.001–1.035)
pH: 7 (ref 5.0–8.0)

## 2016-11-03 LAB — T4, FREE: FREE T4: 1.02 ng/dL (ref 0.60–1.60)

## 2016-11-03 LAB — TSH: TSH: 1.42 u[IU]/mL (ref 0.35–4.50)

## 2016-11-03 LAB — GLUCOSE, RANDOM: GLUCOSE: 126 mg/dL — AB (ref 70–99)

## 2016-11-03 LAB — HEMOGLOBIN A1C: HEMOGLOBIN A1C: 6 % (ref 4.6–6.5)

## 2016-11-04 LAB — URINE CULTURE: ORGANISM ID, BACTERIA: NO GROWTH

## 2016-11-08 ENCOUNTER — Ambulatory Visit (INDEPENDENT_AMBULATORY_CARE_PROVIDER_SITE_OTHER): Payer: Medicare Other | Admitting: Endocrinology

## 2016-11-08 ENCOUNTER — Encounter: Payer: Self-pay | Admitting: Endocrinology

## 2016-11-08 ENCOUNTER — Other Ambulatory Visit: Payer: Self-pay

## 2016-11-08 VITALS — BP 124/76 | HR 67 | Ht 67.0 in

## 2016-11-08 DIAGNOSIS — I2510 Atherosclerotic heart disease of native coronary artery without angina pectoris: Secondary | ICD-10-CM | POA: Diagnosis not present

## 2016-11-08 DIAGNOSIS — E042 Nontoxic multinodular goiter: Secondary | ICD-10-CM | POA: Diagnosis not present

## 2016-11-08 DIAGNOSIS — R7301 Impaired fasting glucose: Secondary | ICD-10-CM | POA: Diagnosis not present

## 2016-11-08 MED ORDER — SYNTHROID 50 MCG PO TABS: ORAL_TABLET | ORAL | 3 refills | Status: DC

## 2016-11-08 NOTE — Progress Notes (Signed)
Patient ID: Lindsey Erickson, female   DOB: 04-06-1932, 82 y.o.   MRN: [id number]   Reason for Appointment:  Thyroid followup     History of Present Illness:   Lindsey Erickson initially had a multinodular goiter in 1982 Lindsey Erickson has had 3 biopsies of her thyroid nodules subsequently with the last one in 2003 which were benign Her biopsies had shown a few follicular cells and lymphocytes, not clear which nodule was biopsied  ? Hypothyroidism:  Most likely Lindsey Erickson has been on thyroid suppression for the goiter rather than true hypothyroidism although previous records do indicate Hashimoto thyroiditis  The treatments that the patient  had taken include Synthroid 88 mcg for several years    This was reduced to 50 g in 11/16 because of low normal TSH of 0.36.           Because of her symptoms of feeling a little tired and somewhat cold Lindsey Erickson is taking an additional half tablet 3 times a week Subsequent TSH levels have been normal  Does not complain of any recent fatigue or cold intolerance  Compliance with the medical regimen has been as prescribed with taking the tablet in the morning before breakfast.  TSH  Is unchanged   Lab Results  Component Value Date   TSH 1.42 11/03/2016   TSH 1.70 04/13/2016   TSH 1.65 12/22/2015   FREET4 1.02 11/03/2016   FREET4 0.94 04/13/2016   FREET4 0.91 12/22/2015    GOITER: Lindsey Erickson has had a stable enlargement, mostly in the midline Does not complain of any local pressure or discomfort   PREDIABETES:   Her last fasting glucose previously has been as high as 112 but now it is 126 Has not had a glucose tolerance test  However A1c has been consistently in the upper normal range and fasting glucose is 104, stable Lindsey Erickson is unable to exercise or lose weight Has been reluctant to see the dietitian  Wt Readings from Last 3 Encounters:  10/19/16 184 lb (83.5 kg)  07/02/16 185 lb (83.9 kg)  04/23/16 183 lb (83 kg)    Lab Results  Component Value Date   HGBA1C  6.0 11/03/2016   HGBA1C 6.2 04/13/2016   HGBA1C 5.9 (H) 03/24/2015   Lab Results  Component Value Date   LDLCALC 76 04/01/2016   CREATININE 0.70 04/13/2016     Allergies as of 11/08/2016      Reactions   Other Other (See Comments)   Frozen Blood Plasma-caused patient to crash / cardiac arrest / CAN NEVER TAKE THIS AGAIN-PER MD'S.  ALL NARCOTICS- NAUSEA AND VOMITING    Adhesive [tape] Itching, Swelling, Rash, Other (See Comments)   Also pain at location   Codeine Nausea And Vomiting   Bactrim [sulfamethoxazole-trimethoprim]    Muscle aches   Cephalexin Hives   Macrobid [nitrofurantoin Monohyd Macro] Nausea And Vomiting      Medication List       Accurate as of 11/08/16 10:43 AM. Always use your most recent med list.          acetaminophen 500 MG tablet Commonly known as:  TYLENOL Take 500 mg by mouth every 6 (six) hours as needed for moderate pain.   aspirin 81 MG tablet Take 81 mg by mouth daily.   carvedilol 40 MG 24 hr capsule Commonly known as:  COREG CR Take 1 capsule (40 mg total) by mouth every evening.   CENTRUM SILVER ADULT 50+ PO Take 1 tablet by mouth daily.  ciprofloxacin 500 MG tablet Commonly known as:  CIPRO Take 1 tablet (500 mg total) by mouth 2 (two) times daily.   clindamycin 1 % gel Commonly known as:  CLINDAGEL Apply 1 application topically daily.   clopidogrel 75 MG tablet Commonly known as:  PLAVIX Take 1 tablet (75 mg total) by mouth daily.   fluticasone 50 MCG/ACT nasal spray Commonly known as:  FLONASE Place 2 sprays into both nostrils daily.   hydrochlorothiazide 12.5 MG capsule Commonly known as:  MICROZIDE Take 12.5 mg by mouth daily. Take 1 tab Monday, Wednesday and Friday None on other days   HYDROcodone-acetaminophen 5-325 MG tablet Commonly known as:  NORCO Take 1-2 tablets by mouth every 6 (six) hours as needed for moderate pain.   isosorbide mononitrate 60 MG 24 hr tablet Commonly known as:  IMDUR Take 1  tablet (60 mg total) by mouth daily.   lisinopril 40 MG tablet Commonly known as:  PRINIVIL,ZESTRIL Take 1 tablet (40 mg total) by mouth daily.   Methylsulfonylmethane 1000 MG Caps Take 1,000 mg by mouth 2 (two) times daily.   nitroGLYCERIN 0.4 MG SL tablet Commonly known as:  NITROSTAT DISSOLVE 1 TABLET UNDER THE TONGUE EVERY 5 MINUTES AS NEEDED FOR CHEST PAIN   rosuvastatin 20 MG tablet Commonly known as:  CRESTOR Take 1 tablet (20 mg total) by mouth at bedtime.   SYNTHROID 50 MCG tablet Generic drug:  levothyroxine DAW; take 1.5 tabs(78mcg) on Monday, Wednesday and Friday and 1 tab on Tuesday, Thursday, Saturday and Sunday   vitamin C 500 MG tablet Commonly known as:  ASCORBIC ACID Take 1,000 mg by mouth 2 (two) times daily.   VITAMIN D3 PO Take 1 tablet by mouth daily.       Allergies:  Allergies  Allergen Reactions  . 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   ALL NARCOTICS- NAUSEA AND VOMITING   . Adhesive [Tape] Itching, Swelling, Rash and Other (See Comments)    Also pain at location  . Codeine Nausea And Vomiting  . Bactrim [Sulfamethoxazole-Trimethoprim]     Muscle aches  . Cephalexin Hives  . Macrobid [Nitrofurantoin Monohyd Macro] Nausea And Vomiting    Past Medical History:  Diagnosis Date  . Anemia   . Arthritis   . Blood transfusion without reported diagnosis 1957  . CAD (coronary artery disease)    a. NSTEMI 2004 with thrombus in Cx and mod RCA. b. Nonischemic nuc 2008. c. mild NSTEMI 01/2013 (trop 0.42) -> cath with unchanged mod LAD & RCA disease, for continued medical therapy.  . Cancer ([place])   . Cataract   . H/O Hashimoto thyroiditis   . Hyperlipidemia   . HYPERTENSION    a. Also has h/o intermittentl low BP.  Marland Kitchen Hypothyroidism   . Kidney stones   . Myocardial infarction   . Osteoporosis    no fractures  . QT prolongation    a. Prior EKGs QTc 474,  but on 4/1 was 524 for unclear reason.  . Recurrent urinary tract infection   . Squamous cell carcinoma of skin 04/17/2015    Past Surgical History:  Procedure Laterality Date  . APPENDECTOMY  1999  . Bilateral Arthroscopies of knees    . CARDIAC CATHETERIZATION     Moderate 3 vessel CAD by cath 05/2012  . CHOLECYSTECTOMY  02/2010  . JOINT REPLACEMENT  01/2010   L TKR - allusio  .  LEFT HEART CATHETERIZATION WITH CORONARY ANGIOGRAM N/A 05/22/2012   Procedure: LEFT HEART CATHETERIZATION WITH CORONARY ANGIOGRAM;  Surgeon: Sinclair Grooms, MD;  Location: [facility] CATH LAB;  Service: Cardiovascular;  Laterality: N/A;  . LEFT HEART CATHETERIZATION WITH CORONARY ANGIOGRAM N/A 02/20/2014   Procedure: LEFT HEART CATHETERIZATION WITH CORONARY ANGIOGRAM;  Surgeon: Peter M Martinique, MD;  Location[facility] LAB;  Service: Cardiovascular;  Laterality: N/A;  . TUBAL LIGATION      Family History  Problem Relation Age of Onset  . Allergies Father   . Cancer Father   . Coronary artery disease Mother 37  . Heart disease Mother   . CAD Sister 38  . Cancer Sister   . AAA (abdominal aortic aneurysm) Sister   . Hypertension Sister   . Hyperlipidemia Sister   . Diabetes Sister   . Heart disease Sister   . Diabetes Maternal Aunt     Social History:  reports that Lindsey Erickson has never smoked. Lindsey Erickson has never used smokeless tobacco. Lindsey Erickson reports that Lindsey Erickson does not drink alcohol or use drugs.  REVIEW Of SYSTEMS:   History of CAD, followed regularly by cardiologist  Hypertension: Is  on a 2 drug regimen, Followed by PCP and cardiologist   Examination:   BP 124/76   Pulse 67   Ht 5\' 7"  (1.702 m)   SpO2 97%    Thyroid is enlarged diffusely but mostly in the midline extending bilaterally It is firm but no discrete nodules felt Lower neck circumference is 41 cm    Assessments/Plan   Impaired fasting glucose:  Lindsey Erickson continues to have prediabetes but despite A1c being 6.0 or fasting glucose is now 126 Indicated to the  patient that this may  possibly be a progression of her to overt diabetes  For now will go ahead and do a glucose tolerance test, abnormal will need metformin Also probably needs to see the dietitian  Multinodular goiter, long-standing, clinically stable with no local pressure symptoms Examination findings are similar to previous exams Her neck circumference is the same  TSH is normal and will continue the same dose of 50 g, 8-1/2 tablets a week    Lindsey Erickson 11/08/2016, 10:43 AM

## 2016-11-10 ENCOUNTER — Other Ambulatory Visit: Payer: Medicare Other

## 2016-11-10 DIAGNOSIS — R7301 Impaired fasting glucose: Secondary | ICD-10-CM

## 2016-11-10 LAB — GLUCOSE TOLERANCE, 2 HOURS
GLUCOSE 1 HOUR GTT: 204 mg/dL
GLUCOSE, FASTING: 129 mg/dL — AB (ref 70–99)
Glucose, 2 hour: 149 mg/dL

## 2016-11-11 ENCOUNTER — Other Ambulatory Visit: Payer: Self-pay | Admitting: *Deleted

## 2016-11-11 ENCOUNTER — Other Ambulatory Visit: Payer: Self-pay

## 2016-11-11 DIAGNOSIS — H6981 Other specified disorders of Eustachian tube, right ear: Secondary | ICD-10-CM

## 2016-11-11 MED ORDER — SYNTHROID 50 MCG PO TABS: ORAL_TABLET | ORAL | 3 refills | Status: DC

## 2016-11-11 MED ORDER — FLUTICASONE PROPIONATE 50 MCG/ACT NA SUSP: 2.0000 | Freq: Every day | NASAL | 3 refills | Status: DC

## 2016-11-11 NOTE — Telephone Encounter (Signed)
Received fax requesting refill on Flonase.   Refill appropriate and filled per protocol.  

## 2016-11-12 ENCOUNTER — Telehealth: Payer: Self-pay | Admitting: Endocrinology

## 2016-11-12 NOTE — Telephone Encounter (Signed)
Pt called and insisted that she does not want any medication called in for her until she comes to speak with you about this.  Scheduled 12/03/16.

## 2016-11-16 NOTE — Telephone Encounter (Signed)
See message to be advised.  

## 2016-11-18 ENCOUNTER — Encounter: Payer: Self-pay | Admitting: Family Medicine

## 2016-11-18 ENCOUNTER — Ambulatory Visit (INDEPENDENT_AMBULATORY_CARE_PROVIDER_SITE_OTHER): Payer: Medicare Other | Admitting: Family Medicine

## 2016-11-18 VITALS — BP 126/72 | HR 72

## 2016-11-18 DIAGNOSIS — R7303 Prediabetes: Secondary | ICD-10-CM | POA: Diagnosis not present

## 2016-11-18 DIAGNOSIS — I2510 Atherosclerotic heart disease of native coronary artery without angina pectoris: Secondary | ICD-10-CM

## 2016-11-18 MED ORDER — LISINOPRIL 40 MG PO TABS: 40.0000 mg | ORAL_TABLET | Freq: Every day | ORAL | 3 refills | Status: DC

## 2016-11-18 NOTE — Progress Notes (Signed)
Subjective:    Patient ID: Lindsey Erickson, female    DOB: 03-Mar-1932, 82 y.o.   MRN: [id number]  HPI  A she is an 82 year old white female here today to discuss lab work. Her fasting blood sugar was elevated at 126. She's had a hemoglobin A1c that was 6.0. She had a 2 hour glucose tolerance test. The 1 hour glucose reading was greater than 200. She is here today to discuss options for management. Past medical history significant for coronary artery disease under maximum medical therapy.   Past Medical History:  Diagnosis Date  . Anemia   . Arthritis   . Blood transfusion without reported diagnosis 1957  . CAD (coronary artery disease)    a. NSTEMI 2004 with thrombus in Cx and mod RCA. b. Nonischemic nuc 2008. c. mild NSTEMI 01/2013 (trop 0.42) -> cath with unchanged mod LAD & RCA disease, for continued medical therapy.  . Cancer ([place])   . Cataract   . H/O Hashimoto thyroiditis   . Hyperlipidemia   . HYPERTENSION    a. Also has h/o intermittentl low BP.  Marland Kitchen Hypothyroidism   . Kidney stones   . Myocardial infarction   . Osteoporosis    no fractures  . QT prolongation    a. Prior EKGs QTc 474, but on 4/1 was 524 for unclear reason.  . Recurrent urinary tract infection   . Squamous cell carcinoma of skin 04/17/2015   Past Surgical History:  Procedure Laterality Date  . APPENDECTOMY  1999  . Bilateral Arthroscopies of knees    . CARDIAC CATHETERIZATION     Moderate 3 vessel CAD by cath 05/2012  . CHOLECYSTECTOMY  02/2010  . JOINT REPLACEMENT  01/2010   L TKR - allusio  . LEFT HEART CATHETERIZATION WITH CORONARY ANGIOGRAM N/A 05/22/2012   Procedure: LEFT HEART CATHETERIZATION WITH CORONARY ANGIOGRAM;  Surgeon: Sinclair Grooms, MD;  Location: [facility] CATH LAB;  Service: Cardiovascular;  Laterality: N/A;  . LEFT HEART CATHETERIZATION WITH CORONARY ANGIOGRAM N/A 02/20/2014   Procedure: LEFT HEART CATHETERIZATION WITH CORONARY ANGIOGRAM;  Surgeon: Peter M Martinique, MD;  Location: [facility] CATH LAB;   Service: Cardiovascular;  Laterality: N/A;  . TUBAL LIGATION     Current Outpatient Prescriptions on File Prior to Visit  Medication Sig Dispense Refill  . acetaminophen (TYLENOL) 500 MG tablet Take 500 mg by mouth every 6 (six) hours as needed for moderate pain.    . Ascorbic Acid (VITAMIN C) 500 MG tablet Take 1,000 mg by mouth 2 (two) times daily.     Marland Kitchen aspirin 81 MG tablet Take 81 mg by mouth daily.      . carvedilol (COREG CR) 40 MG 24 hr capsule Take 1 capsule (40 mg total) by mouth every evening. 90 capsule 3  . Cholecalciferol (VITAMIN D3 PO) Take 1 tablet by mouth daily.    . clindamycin (CLINDAGEL) 1 % gel Apply 1 application topically daily.    . clopidogrel (PLAVIX) 75 MG tablet Take 1 tablet (75 mg total) by mouth daily. 90 tablet 3  . fluticasone (FLONASE) 50 MCG/ACT nasal spray Place 2 sprays into both nostrils daily. 48 g 3  . hydrochlorothiazide (MICROZIDE) 12.5 MG capsule Take 12.5 mg by mouth daily. Take 1 tab Monday, Wednesday and Friday None on other days    . isosorbide mononitrate (IMDUR) 60 MG 24 hr tablet Take 1 tablet (60 mg total) by mouth daily. 90 tablet 3  . Methylsulfonylmethane 1000 MG CAPS Take 1,000  mg by mouth 2 (two) times daily.     . Multiple Vitamins-Minerals (CENTRUM SILVER ADULT 50+ PO) Take 1 tablet by mouth daily.    . nitroGLYCERIN (NITROSTAT) 0.4 MG SL tablet DISSOLVE 1 TABLET UNDER THE TONGUE EVERY 5 MINUTES AS NEEDED FOR CHEST PAIN 50 tablet 1  . rosuvastatin (CRESTOR) 20 MG tablet Take 1 tablet (20 mg total) by mouth at bedtime. 90 tablet 3  . SYNTHROID 50 MCG tablet DAW; take 1.5 tabs(29mcg) on Monday, Wednesday and Friday and 1 tab on Tuesday, Thursday, Saturday and Sunday 102 tablet 3  . HYDROcodone-acetaminophen (NORCO) 5-325 MG tablet Take 1-2 tablets by mouth every 6 (six) hours as needed for moderate pain. (Patient not taking: Reported on 11/18/2016) 30 tablet 0   No current facility-administered medications on file prior to visit.     Allergies  Allergen Reactions  . 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   ALL NARCOTICS- NAUSEA AND VOMITING   . Adhesive [Tape] Itching, Swelling, Rash and Other (See Comments)    Also pain at location  . Codeine Nausea And Vomiting  . Bactrim [Sulfamethoxazole-Trimethoprim]     Muscle aches  . Cephalexin Hives  . Macrobid [Nitrofurantoin Monohyd Macro] Nausea And Vomiting   Social History   Social History  . Marital status: Married    Spouse name: N/A  . Number of children: N/A  . Years of education: N/A   Occupational History  . Not on file.   Social History Main Topics  . Smoking status: Never Smoker  . Smokeless tobacco: Never Used  . Alcohol use No  . Drug use: No  . Sexual activity: Not Currently   Other Topics Concern  . Not on file   Social History Narrative   Married, 2 children. Retired from [employer] other systems reviewed and are negative.      Objective:   Physical Exam  Constitutional: She appears well-developed and well-nourished.  Cardiovascular: Normal rate and regular rhythm.   Pulmonary/Chest: Effort normal and breath sounds normal.  Vitals reviewed.         Assessment & Plan:  Prediabetes  Patient certainly prediabetic and exhibiting objective findings of early type 2 diabetes mellitus. However given her age, her medical comorbidities, and based on the results of the ACCORD study, I suggested therapeutic lifestyle changes, low carbohydrate diet, regular aerobic exercise, 10% body weight loss as a means to manage her sugars with close clinical monitoring as opposed to medication. I explained to the patient that either option is reasonable. My opinion is more of a conservative approach based on her other medical comorbidities. She is scheduled to see her endocrinologist in the next few weeks. I recommended that she  get his opinion as well

## 2016-11-21 NOTE — Telephone Encounter (Signed)
Noted  

## 2016-11-30 ENCOUNTER — Ambulatory Visit (INDEPENDENT_AMBULATORY_CARE_PROVIDER_SITE_OTHER): Payer: Medicare Other | Admitting: Family Medicine

## 2016-11-30 ENCOUNTER — Other Ambulatory Visit: Payer: Self-pay | Admitting: Family Medicine

## 2016-11-30 VITALS — BP 138/62 | HR 88 | Temp 99.4°F | Resp 16 | Ht 67.0 in | Wt 176.0 lb

## 2016-11-30 DIAGNOSIS — J101 Influenza due to other identified influenza virus with other respiratory manifestations: Secondary | ICD-10-CM

## 2016-11-30 DIAGNOSIS — R509 Fever, unspecified: Secondary | ICD-10-CM

## 2016-11-30 LAB — INFLUENZA A AND B AG, IMMUNOASSAY
Influenza A Antigen: DETECTED — AB
Influenza B Antigen: NOT DETECTED

## 2016-11-30 MED ORDER — OSELTAMIVIR PHOSPHATE 75 MG PO CAPS: 75.0000 mg | ORAL_CAPSULE | Freq: Two times a day (BID) | ORAL | 0 refills | Status: DC

## 2016-11-30 NOTE — Progress Notes (Signed)
Subjective:    Patient ID: Lindsey Erickson, female    DOB: Jan 16, 1932, 83 y.o.   MRN: [id number]  HPI Symptoms began 48 hours ago with diffuse body aches, chest congestion, rhinorrhea, sore scratchy throat, head congestion. Symptoms were sudden in onset. She reports significant fatigue and diffuse myalgias. Presentation is consistent with viral influenza  Past Medical History:  Diagnosis Date  . Anemia   . Arthritis   . Blood transfusion without reported diagnosis 1957  . CAD (coronary artery disease)    a. NSTEMI 2004 with thrombus in Cx and mod RCA. b. Nonischemic nuc 2008. c. mild NSTEMI 01/2013 (trop 0.42) -> cath with unchanged mod LAD & RCA disease, for continued medical therapy.  . Cancer ([place])   . Cataract   . H/O Hashimoto thyroiditis   . Hyperlipidemia   . HYPERTENSION    a. Also has h/o intermittentl low BP.  Marland Kitchen Hypothyroidism   . Kidney stones   . Myocardial infarction   . Osteoporosis    no fractures  . Prediabetes   . QT prolongation    a. Prior EKGs QTc 474, but on 4/1 was 524 for unclear reason.  . Recurrent urinary tract infection   . Squamous cell carcinoma of skin 04/17/2015   Past Surgical History:  Procedure Laterality Date  . APPENDECTOMY  1999  . Bilateral Arthroscopies of knees    . CARDIAC CATHETERIZATION     Moderate 3 vessel CAD by cath 05/2012  . CHOLECYSTECTOMY  02/2010  . JOINT REPLACEMENT  01/2010   L TKR - allusio  . LEFT HEART CATHETERIZATION WITH CORONARY ANGIOGRAM N/A 05/22/2012   Procedure: LEFT HEART CATHETERIZATION WITH CORONARY ANGIOGRAM;  Surgeon: Sinclair Grooms, MD;  Location: [facility] CATH LAB;  Service: Cardiovascular;  Laterality: N/A;  . LEFT HEART CATHETERIZATION WITH CORONARY ANGIOGRAM N/A 02/20/2014   Procedure: LEFT HEART CATHETERIZATION WITH CORONARY ANGIOGRAM;  Surgeon: Peter M Martinique, MD;  Location: [facility] CATH LAB;  Service: Cardiovascular;  Laterality: N/A;  . TUBAL LIGATION     Current Outpatient Prescriptions on File Prior to  Visit  Medication Sig Dispense Refill  . acetaminophen (TYLENOL) 500 MG tablet Take 500 mg by mouth every 6 (six) hours as needed for moderate pain.    . Ascorbic Acid (VITAMIN C) 500 MG tablet Take 1,000 mg by mouth 2 (two) times daily.     Marland Kitchen aspirin 81 MG tablet Take 81 mg by mouth daily.      . Cholecalciferol (VITAMIN D3 PO) Take 1 tablet by mouth daily.    . clindamycin (CLINDAGEL) 1 % gel Apply 1 application topically daily.    . clopidogrel (PLAVIX) 75 MG tablet Take 1 tablet (75 mg total) by mouth daily. 90 tablet 3  . fluticasone (FLONASE) 50 MCG/ACT nasal spray Place 2 sprays into both nostrils daily. 48 g 3  . HYDROcodone-acetaminophen (NORCO) 5-325 MG tablet Take 1-2 tablets by mouth every 6 (six) hours as needed for moderate pain. 30 tablet 0  . isosorbide mononitrate (IMDUR) 60 MG 24 hr tablet Take 1 tablet (60 mg total) by mouth daily. 90 tablet 3  . lisinopril (PRINIVIL,ZESTRIL) 40 MG tablet Take 1 tablet (40 mg total) by mouth daily. 90 tablet 3  . Methylsulfonylmethane 1000 MG CAPS Take 1,000 mg by mouth 2 (two) times daily.     . Multiple Vitamins-Minerals (CENTRUM SILVER ADULT 50+ PO) Take 1 tablet by mouth daily.    . nitroGLYCERIN (NITROSTAT) 0.4 MG SL tablet DISSOLVE  1 TABLET UNDER THE TONGUE EVERY 5 MINUTES AS NEEDED FOR CHEST PAIN 50 tablet 1  . rosuvastatin (CRESTOR) 20 MG tablet Take 1 tablet (20 mg total) by mouth at bedtime. 90 tablet 3  . SYNTHROID 50 MCG tablet DAW; take 1.5 tabs(74mcg) on Monday, Wednesday and Friday and 1 tab on Tuesday, Thursday, Saturday and Sunday 102 tablet 3   No current facility-administered medications on file prior to visit.    Allergies  Allergen Reactions  . 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   ALL NARCOTICS- NAUSEA AND VOMITING   . Adhesive [Tape] Itching, Swelling, Rash and Other (See Comments)    Also pain at location  . Codeine  Nausea And Vomiting  . Bactrim [Sulfamethoxazole-Trimethoprim]     Muscle aches  . Cephalexin Hives  . Macrobid [Nitrofurantoin Monohyd Macro] Nausea And Vomiting   Social History   Social History  . Marital status: Married    Spouse name: N/A  . Number of children: N/A  . Years of education: N/A   Occupational History  . Not on file.   Social History Main Topics  . Smoking status: Never Smoker  . Smokeless tobacco: Never Used  . Alcohol use No  . Drug use: No  . Sexual activity: Not Currently   Other Topics Concern  . Not on file   Social History Narrative   Married, 2 children. Retired from [employer] other systems reviewed and are negative.      Objective:   Physical Exam  HENT:  Right Ear: External ear normal.  Left Ear: External ear normal.  Nose: Mucosal edema and rhinorrhea present. Right sinus exhibits no maxillary sinus tenderness and no frontal sinus tenderness. Left sinus exhibits no maxillary sinus tenderness and no frontal sinus tenderness.  Mouth/Throat: Oropharynx is clear and moist. No oropharyngeal exudate.  Eyes: Conjunctivae are normal.  Neck: Neck supple.  Cardiovascular: Normal rate, regular rhythm and normal heart sounds.   Pulmonary/Chest: Effort normal and breath sounds normal. No respiratory distress. She has no wheezes. She has no rales.  Lymphadenopathy:    She has no cervical adenopathy.          Assessment & Plan:  Fever, unspecified fever cause - Plan: Influenza A and B Ag, Immunoassay, oseltamivir (TAMIFLU) 75 MG capsule  I will check a flu test today however her presentation and history is consistent with viral influenza. Given her age and medical comorbidities I would treat the patient aggressively with Tamiflu 75 mg by mouth twice a day for 5 days. Use Tylenol as needed to keep fever under control as well as for body aches. Drink plenty of fluids. Monitor patient closely for pneumonia

## 2016-12-03 ENCOUNTER — Ambulatory Visit: Payer: Medicare Other | Admitting: Endocrinology

## 2017-01-26 ENCOUNTER — Other Ambulatory Visit: Payer: Self-pay | Admitting: Family Medicine

## 2017-01-27 ENCOUNTER — Other Ambulatory Visit: Payer: Self-pay | Admitting: *Deleted

## 2017-01-27 MED ORDER — NITROGLYCERIN 0.4 MG SL SUBL: SUBLINGUAL_TABLET | SUBLINGUAL | 1 refills | Status: DC

## 2017-02-09 ENCOUNTER — Encounter: Payer: Self-pay | Admitting: Family Medicine

## 2017-02-09 ENCOUNTER — Ambulatory Visit (INDEPENDENT_AMBULATORY_CARE_PROVIDER_SITE_OTHER): Payer: Medicare Other | Admitting: Family Medicine

## 2017-02-09 VITALS — BP 138/82 | HR 88 | Temp 98.1°F | Resp 16

## 2017-02-09 DIAGNOSIS — N390 Urinary tract infection, site not specified: Secondary | ICD-10-CM

## 2017-02-09 LAB — URINALYSIS, MICROSCOPIC ONLY
Casts: NONE SEEN [LPF]
Crystals: NONE SEEN [HPF]
WBC, UA: >60 WBC/HPF — AB (ref <=5)
Yeast: NONE SEEN [HPF]

## 2017-02-09 LAB — URINALYSIS, ROUTINE W REFLEX MICROSCOPIC
Bilirubin Urine: NEGATIVE
GLUCOSE, UA: NEGATIVE
HGB URINE DIPSTICK: NEGATIVE
Ketones, ur: NEGATIVE
NITRITE: NEGATIVE
PH: 5.5 (ref 5.0–8.0)
Specific Gravity, Urine: 1.02 (ref 1.001–1.035)

## 2017-02-09 MED ORDER — CIPROFLOXACIN HCL 500 MG PO TABS: 500.0000 mg | ORAL_TABLET | Freq: Two times a day (BID) | ORAL | 0 refills | Status: DC

## 2017-02-09 NOTE — Progress Notes (Signed)
   Subjective:    Patient ID: Lindsey Erickson, female    DOB: 12/03/1931, 83 y.o.   MRN: [id number]  Patient presents for Dysuria (urinary frequency)  Sunday night started with urinary frequency, and buring with urination and pressure/fullness. Unable to take AZO due to nausea.  No fever/chills, has some right flank pain but also has OA and has pain in that same area.  Has seen UROLOGY in past. Past cultures she had klebsiella, she was treated in Nov, had sigingicant WBC, blood, yet culture was neg, we did repeat culture 2 weeks later and UA, hematuria had resolved and culture was still neg     Review Of Systems:  GEN- denies fatigue, fever, weight loss,weakness, recent illness HEENT- denies eye drainage, change in vision, nasal discharge, CVS- denies chest pain, palpitations RESP- denies SOB, cough, wheeze ABD- denies N/V, change in stools, abd pain GU- denies dysuria, hematuria, dribbling, incontinence MSK- denies joint pain, muscle aches, injury Neuro- denies headache, dizziness, syncope, seizure activity       Objective:    BP 138/82   Pulse 88   Temp 98.1 F (36.7 C) (Oral)   Resp 16   SpO2 98%  GEN- NAD, alert and oriented x3 HEENT- PERRL, EOMI, non injected sclera, pink conjunctiva, MMM, oropharynx clear CVS- RRR, no murmur RESP-CTAB ABD-NABS,soft,NT,ND, no CVA tenderness Pulses- Radial 2+        Assessment & Plan:      Problem List Items Addressed This Visit    None    Visit Diagnoses    Urinary tract infection without hematuria, site unspecified    -  Primary   Start Cipro, culture again, if this is negative possible she has more IC may need urology, can not tolerate AZO   Relevant Orders   Urinalysis, Routine w reflex microscopic (Completed)   Urine culture      Note: This dictation was prepared with Dragon dictation along with smaller phrase technology. Any transcriptional errors that result from this process are unintentional.

## 2017-02-09 NOTE — Patient Instructions (Signed)
f/u AS NEEDED

## 2017-02-11 LAB — URINE CULTURE

## 2017-03-09 ENCOUNTER — Encounter: Payer: Self-pay | Admitting: Cardiovascular Disease

## 2017-03-09 ENCOUNTER — Ambulatory Visit (INDEPENDENT_AMBULATORY_CARE_PROVIDER_SITE_OTHER): Payer: Medicare Other | Admitting: Cardiovascular Disease

## 2017-03-09 DIAGNOSIS — E7800 Pure hypercholesterolemia, unspecified: Secondary | ICD-10-CM

## 2017-03-09 DIAGNOSIS — I10 Essential (primary) hypertension: Secondary | ICD-10-CM

## 2017-03-09 DIAGNOSIS — I2510 Atherosclerotic heart disease of native coronary artery without angina pectoris: Secondary | ICD-10-CM

## 2017-03-09 NOTE — Assessment & Plan Note (Signed)
History of hypertension blood pressure measured today at 144/82. She is on hydrochlorothiazide and lisinopril as well as carvedilol. Continue current meds at current dosing. She does say that on the day she takes her heart for thiazide which is every other day her blood pressure drops to the 217 systolic range and she does get symptomatic during those episodes. She does sit down and does not perform significant activities during the times. However, she says a direct improves or peripheral edema.

## 2017-03-09 NOTE — Patient Instructions (Signed)

## 2017-03-09 NOTE — Progress Notes (Signed)
03/09/2017 Bartolo Darter   [birth year]  [id number]  Primary Physician Odette Fraction, MD Primary Cardiologist: Lorretta Harp MD Renae Gloss  HPI:  Ms Zenk is an 83 year old mildly overweight married Caucasian female mother of 2 children , grandmother of 4 grandchildren who is self-referred to be established in my practice. She was previously a cardiology patient of Dr. Pernell Dupre. Her new primary care physician is Dr. Margaretmary Eddy at [facility]. I last saw her in the office 03/09/16. Cardiovascular risk factor profile is notable for treated hypertension and hyperlipidemia. She does have a family history his sister who's had stents. Her cardiac history dates back to 2004 when she had a non-STEMI related to thrombus in the circumflex with moderate RCA disease. She had a negative nuclear stress test in 2008 and underwent cardiac catheterization one year ago on 02/20/14 by Dr. Irish Lack after being admitted with chest pain and evolving positive enzymes. She gets infrequent nitroglycerin responsive chest pain. Her anatomy at the time of her last cath was notable for a 70% mid RCA lesion, 50-60% mid-circumflex lesion. The RCA underwent FFR interrogation which measured 0.9 suggesting that it was not physiologically significant. Since I saw her a year ago she's remained clinically stable with occasional nitroglycerin responsive angina.   Current Outpatient Prescriptions  Medication Sig Dispense Refill  . acetaminophen (TYLENOL) 500 MG tablet Take 500 mg by mouth every 6 (six) hours as needed for moderate pain.    . Ascorbic Acid (VITAMIN C) 500 MG tablet Take 1,000 mg by mouth 2 (two) times daily.     Marland Kitchen aspirin 81 MG tablet Take 81 mg by mouth daily.      . clindamycin (CLINDAGEL) 1 % gel Apply 1 application topically daily.    . clopidogrel (PLAVIX) 75 MG tablet Take 1 tablet (75 mg total) by mouth daily. 90 tablet 3  . COREG CR 40 MG 24 hr capsule TAKE 1  CAPSULE EVERY EVENING 90 capsule 3  . fluticasone (FLONASE) 50 MCG/ACT nasal spray Place 2 sprays into both nostrils daily. 48 g 3  . hydrochlorothiazide (MICROZIDE) 12.5 MG capsule TAKE 1 CAPSULE DAILY (Patient taking differently: TAKE 1 CAPSULE MWF) 90 capsule 3  . isosorbide mononitrate (IMDUR) 60 MG 24 hr tablet Take 1 tablet (60 mg total) by mouth daily. 90 tablet 3  . lisinopril (PRINIVIL,ZESTRIL) 40 MG tablet Take 1 tablet (40 mg total) by mouth daily. 90 tablet 3  . Methylsulfonylmethane 1000 MG CAPS Take 1,000 mg by mouth 2 (two) times daily.     . Multiple Vitamins-Minerals (CENTRUM SILVER ADULT 50+ PO) Take 1 tablet by mouth daily.    . nitroGLYCERIN (NITROSTAT) 0.4 MG SL tablet DISSOLVE 1 TABLET UNDER THE TONGUE EVERY 5 MINUTES AS NEEDED FOR CHEST PAIN 25 tablet 1  . rosuvastatin (CRESTOR) 20 MG tablet Take 1 tablet (20 mg total) by mouth at bedtime. 90 tablet 3  . SYNTHROID 50 MCG tablet DAW; take 1.5 tabs(81mcg) on Monday, Wednesday and Friday and 1 tab on Tuesday, Thursday, Saturday and Sunday 102 tablet 3   No current facility-administered medications for this visit.     Allergies  Allergen Reactions  . 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   ALL NARCOTICS- NAUSEA AND VOMITING   . Adhesive [Tape] Itching, Swelling, Rash and Other (See Comments)    Also pain at location  .  Codeine Nausea And Vomiting  . Bactrim [Sulfamethoxazole-Trimethoprim]     Muscle aches  . Cephalexin Hives  . Macrobid [Nitrofurantoin Monohyd Macro] Nausea And Vomiting  . Tamiflu [Oseltamivir Phosphate]     Social History   Social History  . Marital status: Married    Spouse name: N/A  . Number of children: N/A  . Years of education: N/A   Occupational History  . Not on file.   Social History Main Topics  . Smoking status: Never Smoker  . Smokeless tobacco: Never Used  . Alcohol use No  . Drug  use: No  . Sexual activity: Not Currently   Other Topics Concern  . Not on file   Social History Narrative   Married, 2 children. Retired from [employer]: General: negative for chills, fever, night sweats or weight changes.  Cardiovascular: negative for chest pain, dyspnea on exertion, edema, orthopnea, palpitations, paroxysmal nocturnal dyspnea or shortness of breath Dermatological: negative for rash Respiratory: negative for cough or wheezing Urologic: negative for hematuria Abdominal: negative for nausea, vomiting, diarrhea, bright red blood per rectum, melena, or hematemesis Neurologic: negative for visual changes, syncope, or dizziness All other systems reviewed and are otherwise negative except as noted above.    Blood pressure (!) 144/82, pulse 66, height 5\' 7"  (1.702 m), weight 171 lb 3.2 oz (77.7 kg).  General appearance: alert and no distress Neck: no adenopathy, no carotid bruit, no JVD, supple, symmetrical, trachea midline and thyroid not enlarged, symmetric, no tenderness/mass/nodules Lungs: clear to auscultation bilaterally Heart: regular rate and rhythm, S1, S2 normal, no murmur, click, rub or gallop Extremities: extremities normal, atraumatic, no cyanosis or edema  EKG normal sinus rhythm at 66 with nonspecific ST and T-wave changes. I personally reviewed this EKG  ASSESSMENT AND PLAN:   Essential hypertension History of hypertension blood pressure measured today at 144/82. She is on hydrochlorothiazide and lisinopril as well as carvedilol. Continue current meds at current dosing. She does say that on the day she takes her heart for thiazide which is every other day her blood pressure drops to the 867 systolic range and she does get symptomatic during those episodes. She does sit down and does not perform significant activities during the times. However, she says a direct improves or peripheral edema.  CAD (coronary artery  disease) History of CAD status post non-STEMI back in 2004 with thrombus in her circumflex and moderate RCA disease she had a cath performed by Dr. Irish Lack 02/20/14 revealed a 70% mid RCA and 50 and 60% mid circumflex. The RCA underwent FFR interrogation was measured 0.9 suggesting this was not physiologically significant. She is taken to the subdermal electrician over the last 12 months and otherwise is stable.  Hyperlipidemia History of hyperlipidemia on statin therapy followed by her PCP      Lorretta Harp MD [facility] 03/09/2017 9:43 AM

## 2017-03-09 NOTE — Assessment & Plan Note (Signed)
History of CAD status post non-STEMI back in 2004 with thrombus in her circumflex and moderate RCA disease she had a cath performed by Dr. Irish Lack 02/20/14 revealed a 70% mid RCA and 50 and 60% mid circumflex. The RCA underwent FFR interrogation was measured 0.9 suggesting this was not physiologically significant. She is taken to the subdermal electrician over the last 12 months and otherwise is stable.

## 2017-03-09 NOTE — Assessment & Plan Note (Signed)
History of hyperlipidemia on statin therapy followed by her PCP. 

## 2017-04-06 ENCOUNTER — Other Ambulatory Visit: Payer: Self-pay | Admitting: Family Medicine

## 2017-04-11 ENCOUNTER — Encounter: Payer: Self-pay | Admitting: Family Medicine

## 2017-04-11 ENCOUNTER — Ambulatory Visit (INDEPENDENT_AMBULATORY_CARE_PROVIDER_SITE_OTHER): Payer: Medicare Other | Admitting: Family Medicine

## 2017-04-11 VITALS — BP 138/72 | HR 84 | Temp 97.9°F | Resp 14 | Ht 67.0 in | Wt 170.0 lb

## 2017-04-11 DIAGNOSIS — E042 Nontoxic multinodular goiter: Secondary | ICD-10-CM

## 2017-04-11 DIAGNOSIS — E7800 Pure hypercholesterolemia, unspecified: Secondary | ICD-10-CM

## 2017-04-11 DIAGNOSIS — I10 Essential (primary) hypertension: Secondary | ICD-10-CM | POA: Diagnosis not present

## 2017-04-11 DIAGNOSIS — R300 Dysuria: Secondary | ICD-10-CM

## 2017-04-11 DIAGNOSIS — Z87440 Personal history of urinary (tract) infections: Secondary | ICD-10-CM | POA: Diagnosis not present

## 2017-04-11 DIAGNOSIS — M810 Age-related osteoporosis without current pathological fracture: Secondary | ICD-10-CM | POA: Diagnosis not present

## 2017-04-11 DIAGNOSIS — Z0000 Encounter for general adult medical examination without abnormal findings: Secondary | ICD-10-CM | POA: Diagnosis not present

## 2017-04-11 DIAGNOSIS — I2510 Atherosclerotic heart disease of native coronary artery without angina pectoris: Secondary | ICD-10-CM | POA: Diagnosis not present

## 2017-04-11 LAB — CBC WITH DIFFERENTIAL/PLATELET
BASOS PCT: 1 %
Basophils Absolute: 51 cells/uL (ref 0–200)
EOS PCT: 5 %
Eosinophils Absolute: 255 cells/uL (ref 15–500)
HEMATOCRIT: 41.4 % (ref 35.0–45.0)
HEMOGLOBIN: 13.8 g/dL (ref 12.0–15.0)
LYMPHS ABS: 1530 {cells}/uL (ref 850–3900)
Lymphocytes Relative: 30 %
MCH: 32.7 pg (ref 27.0–33.0)
MCHC: 33.3 g/dL (ref 32.0–36.0)
MCV: 98.1 fL (ref 80.0–100.0)
MONO ABS: 408 {cells}/uL (ref 200–950)
MPV: 9.7 fL (ref 7.5–12.5)
Monocytes Relative: 8 %
NEUTROS ABS: 2856 {cells}/uL (ref 1500–7800)
NEUTROS PCT: 56 %
Platelets: 214 10*3/uL (ref 140–400)
RBC: 4.22 MIL/uL (ref 3.80–5.10)
RDW: 13.3 % (ref 11.0–15.0)
WBC: 5.1 10*3/uL (ref 3.8–10.8)

## 2017-04-11 LAB — URINALYSIS, MICROSCOPIC ONLY
Casts: NONE SEEN [LPF]
Crystals: NONE SEEN [HPF]
RBC / HPF: NONE SEEN RBC/HPF (ref <=2)
YEAST: NONE SEEN [HPF]

## 2017-04-11 LAB — URINALYSIS, ROUTINE W REFLEX MICROSCOPIC
BILIRUBIN URINE: NEGATIVE
Glucose, UA: NEGATIVE
HGB URINE DIPSTICK: NEGATIVE
Ketones, ur: NEGATIVE
NITRITE: NEGATIVE
PROTEIN: NEGATIVE
Specific Gravity, Urine: 1.025 (ref 1.001–1.035)
pH: 5.5 (ref 5.0–8.0)

## 2017-04-11 NOTE — Progress Notes (Signed)
Subjective:    Patient ID: Lindsey Erickson, female    DOB: [birth year], 83 y.o.   MRN: [id number]  HPI Patient is a very pleasant 83 year old white female who is here for CPE. She has a significant past medical history of coronary artery disease. According to her report she is suffered 2 separate myocardial infarctions. However on repeat catheterizations, the patient states that she has lesions which are not amenable to stenting. Therefore she is being managed medically. She is currently on a long-acting nitroglycerin, beta blocker, and ACE inhibitor, statin medication, and dual antiplatelet therapy. She follows up regularly with her cardiologist.  She is currently on Synthroid to help suppress the growth of a goiter. She denies any recent growth. This is followed by Dr. Dwyane Dee.  She is due for mammogram, she is due for colonoscopy. She is due for repeat bone density. She is no longer due for a Pap smear because of age. We had a long discussion today. The patient would not treat any lesion found on a colonoscopy. She desires no surgery or chemotherapy. Therefore I recommended against a colonoscopy because of age and medical comorbidities. She also would not want any kind of surgery or chemotherapy to treat any lesions seen in her breast. Therefore I agreed that performing a mammogram is not in her best interest if she would not treat the problems found. She agrees to a bone density test. She also wants to recheck a urinalysis to ensure resolution of her recent urinary tract infection that she had in March. She does have bilateral pelvic pain in the right lower quadrant and left lower quadrant. It comes and goes. There is no exacerbating or alleviating factor. She can find no relationship to movement, urination, or bowel movements. She has recently been more constipated Immunization History  Administered Date(s) Administered  . Influenza Whole 07/23/2009  . Influenza,inj,Quad PF,36+ Mos 09/22/2015,  08/17/2016  . Influenza-Unspecified 08/22/2013, 09/02/2014  . Pneumococcal Conjugate-13 01/14/2014  . Pneumococcal Polysaccharide-23 02/21/2012  . Zoster 09/23/2006    Past Medical History:  Diagnosis Date  . Anemia   . Arthritis   . Blood transfusion without reported diagnosis 1957  . CAD (coronary artery disease)    a. NSTEMI 2004 with thrombus in Cx and mod RCA. b. Nonischemic nuc 2008. c. mild NSTEMI 01/2013 (trop 0.42) -> cath with unchanged mod LAD & RCA disease, for continued medical therapy.  . Cancer ([place])   . Cataract   . H/O Hashimoto thyroiditis   . Hyperlipidemia   . HYPERTENSION    a. Also has h/o intermittentl low BP.  Marland Kitchen Hypothyroidism   . Kidney stones   . Myocardial infarction ([place])   . Osteoporosis    no fractures  . Prediabetes   . QT prolongation    a. Prior EKGs QTc 474, but on 4/1 was 524 for unclear reason.  . Recurrent urinary tract infection   . Squamous cell carcinoma of skin 04/17/2015   Past Surgical History:  Procedure Laterality Date  . APPENDECTOMY  1999  . Bilateral Arthroscopies of knees    . CARDIAC CATHETERIZATION     Moderate 3 vessel CAD by cath 05/2012  . CHOLECYSTECTOMY  02/2010  . JOINT REPLACEMENT  01/2010   L TKR - allusio  . LEFT HEART CATHETERIZATION WITH CORONARY ANGIOGRAM N/A 05/22/2012   Procedure: LEFT HEART CATHETERIZATION WITH CORONARY ANGIOGRAM;  Surgeon: Sinclair Grooms, MD;  Location: [facility] CATH LAB;  Service: Cardiovascular;  Laterality: N/A;  .  LEFT HEART CATHETERIZATION WITH CORONARY ANGIOGRAM N/A 02/20/2014   Procedure: LEFT HEART CATHETERIZATION WITH CORONARY ANGIOGRAM;  Surgeon: Peter M Martinique, MD;  Location: [facility] CATH LAB;  Service: Cardiovascular;  Laterality: N/A;  . TUBAL LIGATION     Current Outpatient Prescriptions on File Prior to Visit  Medication Sig Dispense Refill  . acetaminophen (TYLENOL) 500 MG tablet Take 500 mg by mouth every 6 (six) hours as needed for moderate pain.    . Ascorbic Acid (VITAMIN C) 500 MG  tablet Take 1,000 mg by mouth 2 (two) times daily.     Marland Kitchen aspirin 81 MG tablet Take 81 mg by mouth daily.      . clindamycin (CLINDAGEL) 1 % gel Apply 1 application topically daily.    . clopidogrel (PLAVIX) 75 MG tablet TAKE 1 TABLET DAILY 90 tablet 3  . COREG CR 40 MG 24 hr capsule TAKE 1 CAPSULE EVERY EVENING 90 capsule 3  . fluticasone (FLONASE) 50 MCG/ACT nasal spray Place 2 sprays into both nostrils daily. (Patient taking differently: Place 2 sprays into both nostrils as needed. ) 48 g 3  . hydrochlorothiazide (MICROZIDE) 12.5 MG capsule TAKE 1 CAPSULE DAILY (Patient taking differently: TAKE 1 CAPSULE MWF) 90 capsule 3  . isosorbide mononitrate (IMDUR) 60 MG 24 hr tablet Take 1 tablet (60 mg total) by mouth daily. 90 tablet 3  . lisinopril (PRINIVIL,ZESTRIL) 40 MG tablet Take 1 tablet (40 mg total) by mouth daily. 90 tablet 3  . Methylsulfonylmethane 1000 MG CAPS Take 1,000 mg by mouth 2 (two) times daily. MSM for joint health    . Multiple Vitamins-Minerals (CENTRUM SILVER ADULT 50+ PO) Take 1 tablet by mouth daily.    . nitroGLYCERIN (NITROSTAT) 0.4 MG SL tablet DISSOLVE 1 TABLET UNDER THE TONGUE EVERY 5 MINUTES AS NEEDED FOR CHEST PAIN 25 tablet 1  . rosuvastatin (CRESTOR) 20 MG tablet Take 1 tablet (20 mg total) by mouth at bedtime. 90 tablet 3  . SYNTHROID 50 MCG tablet DAW; take 1.5 tabs(56mcg) on Monday, Wednesday and Friday and 1 tab on Tuesday, Thursday, Saturday and Sunday 102 tablet 3   No current facility-administered medications on file prior to visit.    Allergies  Allergen Reactions  . 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   ALL NARCOTICS- NAUSEA AND VOMITING   . Adhesive [Tape] Itching, Swelling, Rash and Other (See Comments)    Also pain at location  . Codeine Nausea And Vomiting  . Bactrim [Sulfamethoxazole-Trimethoprim]     Muscle aches  . Cephalexin Hives  . Macrobid  [Nitrofurantoin Monohyd Macro] Nausea And Vomiting  . Tamiflu [Oseltamivir Phosphate]    Social History   Social History  . Marital status: Married    Spouse name: N/A  . Number of children: N/A  . Years of education: N/A   Occupational History  . Not on file.   Social History Main Topics  . Smoking status: Never Smoker  . Smokeless tobacco: Never Used  . Alcohol use No  . Drug use: No  . Sexual activity: Not Currently   Other Topics Concern  . Not on file   Social History Narrative   Married, 2 children. Retired from [employer]   Family History  Problem Relation Age of Onset  . Allergies Father   . Cancer Father   . Coronary artery disease Mother 38  . Heart disease Mother   .  CAD Sister 64  . Cancer Sister   . AAA (abdominal aortic aneurysm) Sister   . Hypertension Sister   . Hyperlipidemia Sister   . Diabetes Sister   . Heart disease Sister   . Diabetes Maternal Aunt       Review of Systems  All other systems reviewed and are negative.      Objective:   Physical Exam  Constitutional: She is oriented to person, place, and time. She appears well-developed and well-nourished. No distress.  Neck: Neck supple. No JVD present. No tracheal deviation present. Thyromegaly present.  Cardiovascular: Normal rate, regular rhythm and normal heart sounds.   No murmur heard. Pulmonary/Chest: Effort normal and breath sounds normal. No respiratory distress. She has no wheezes. She has no rales.  Abdominal: Soft. Bowel sounds are normal. She exhibits no distension. There is no tenderness. There is no rebound and no guarding.  Musculoskeletal: She exhibits no edema.  Lymphadenopathy:    She has no cervical adenopathy.  Neurological: She is alert and oriented to person, place, and time. She has normal reflexes. No cranial nerve deficit. She exhibits normal muscle tone. Coordination normal.  Skin: She is not diaphoretic.  Vitals reviewed.         Assessment &  Plan:  Hx: UTI (urinary tract infection) - Plan: Urinalysis, Routine w reflex microscopic  Pure hypercholesterolemia - Plan: CBC with Differential/Platelet, COMPLETE METABOLIC PANEL WITH GFR, Lipid panel  Routine general medical examination at a health care facility  Essential hypertension  Coronary artery disease involving native coronary artery of native heart without angina pectoris  Multinodular goiter  I will schedule the patient for a bone density test. If there is a substantial decline in her T score, we will resume a bisphosphonate. She declines a colonoscopy. She declines a mammogram. She declines a Pap smear. Check CBC, CMP, fasting lipid panel. If her TSH to her nephrologist. Urinalysis shows mainly contamination. I will send a urine culture. Immunizations are up-to-date. Try MiraLAX every day for possible constipation as a cause of her lower quadrant abdominal discomfort. If no better, proceed with imaging

## 2017-04-11 NOTE — Addendum Note (Signed)
Addended by: Shary Decamp B on: 04/11/2017 02:32 PM   Modules accepted: Orders

## 2017-04-12 ENCOUNTER — Encounter: Payer: Self-pay | Admitting: Family Medicine

## 2017-04-12 LAB — COMPLETE METABOLIC PANEL WITH GFR
ALBUMIN: 4.1 g/dL (ref 3.6–5.1)
ALK PHOS: 57 U/L (ref 33–130)
ALT: 14 U/L (ref 6–29)
AST: 19 U/L (ref 10–35)
BILIRUBIN TOTAL: 0.6 mg/dL (ref 0.2–1.2)
BUN: 22 mg/dL (ref 7–25)
CALCIUM: 9.7 mg/dL (ref 8.6–10.4)
CO2: 26 mmol/L (ref 20–31)
CREATININE: 0.75 mg/dL (ref 0.60–0.88)
Chloride: 103 mmol/L (ref 98–110)
GFR, Est African American: 86 mL/min (ref >=60)
GFR, Est Non African American: 74 mL/min (ref >=60)
GLUCOSE: 103 mg/dL — AB (ref 70–99)
POTASSIUM: 4.3 mmol/L (ref 3.5–5.3)
SODIUM: 141 mmol/L (ref 135–146)
TOTAL PROTEIN: 6.8 g/dL (ref 6.1–8.1)

## 2017-04-12 LAB — LIPID PANEL
CHOL/HDL RATIO: 2.8 ratio (ref <5.0)
CHOLESTEROL: 174 mg/dL (ref <200)
HDL: 62 mg/dL (ref >50)
LDL Cholesterol: 78 mg/dL (ref <100)
Triglycerides: 172 mg/dL — ABNORMAL HIGH (ref <150)
VLDL: 34 mg/dL — ABNORMAL HIGH (ref <30)

## 2017-04-13 LAB — URINE CULTURE

## 2017-04-14 ENCOUNTER — Encounter: Payer: Self-pay | Admitting: Family Medicine

## 2017-05-06 ENCOUNTER — Other Ambulatory Visit: Payer: Self-pay | Admitting: Endocrinology

## 2017-05-06 ENCOUNTER — Other Ambulatory Visit (INDEPENDENT_AMBULATORY_CARE_PROVIDER_SITE_OTHER): Payer: Medicare Other

## 2017-05-06 DIAGNOSIS — E042 Nontoxic multinodular goiter: Secondary | ICD-10-CM

## 2017-05-06 LAB — TSH: TSH: 1.09 u[IU]/mL (ref 0.35–4.50)

## 2017-05-06 LAB — T4, FREE: FREE T4: 0.98 ng/dL (ref 0.60–1.60)

## 2017-05-09 ENCOUNTER — Telehealth: Payer: Self-pay | Admitting: *Deleted

## 2017-05-09 ENCOUNTER — Ambulatory Visit (INDEPENDENT_AMBULATORY_CARE_PROVIDER_SITE_OTHER): Payer: Medicare Other | Admitting: Endocrinology

## 2017-05-09 ENCOUNTER — Encounter: Payer: Self-pay | Admitting: Endocrinology

## 2017-05-09 VITALS — BP 136/78 | HR 68 | Ht 67.0 in | Wt 171.6 lb

## 2017-05-09 DIAGNOSIS — I2510 Atherosclerotic heart disease of native coronary artery without angina pectoris: Secondary | ICD-10-CM

## 2017-05-09 DIAGNOSIS — E042 Nontoxic multinodular goiter: Secondary | ICD-10-CM

## 2017-05-09 DIAGNOSIS — R7303 Prediabetes: Secondary | ICD-10-CM

## 2017-05-09 LAB — POCT GLYCOSYLATED HEMOGLOBIN (HGB A1C): Hemoglobin A1C: 5.4

## 2017-05-09 NOTE — Telephone Encounter (Signed)
Pt seen Dr Dwyane Dee today. She stated that her PCP Dr. Dennard Schaumann would like for Dr. Dwyane Dee to order her Bone Density test. Her last test was done about 59yrs ago at [facility] on [address]. Patient would like to go back to that site to get it done. Her PCP wants to compare it to her test 3 yrs ago. Patient states you can call her if you have any issues . Her number is  [phone]  Please advise MD. Thank you

## 2017-05-09 NOTE — Progress Notes (Signed)
Patient ID: Lindsey Erickson, female   DOB: 21-Aug-1932, 83 y.o.   MRN: [id number]   Reason for Appointment:  Endocrinology followup     History of Present Illness:   She initially had a multinodular goiter in 1982 She has had 3 biopsies of her thyroid nodules subsequently with the last one in 2003 which were benign Her biopsies had shown a few follicular cells and lymphocytes, not clear which nodule was biopsied  ? Hypothyroidism:  Most likely she has been on thyroid suppression for the goiter rather than true hypothyroidism although previous records do indicate Hashimoto thyroiditis  The treatments that the patient  had taken include Synthroid 88 mcg for several years  This was reduced to 50 g in 11/16 because of low normal TSH of 0.36.           Because of her symptoms of feeling a little tired and somewhat cold she is taking an additional half tablet 3 times a week Subsequent TSH levels have been normal including in 6/18  Does not complain of any recent fatigue or cold intolerance  She is very regular with taking the tablet in the morning before breakfast.   Lab Results  Component Value Date   TSH 1.09 05/06/2017   TSH 1.42 11/03/2016   TSH 1.70 04/13/2016   FREET4 0.98 05/06/2017   FREET4 1.02 11/03/2016   FREET4 0.94 04/13/2016    GOITER: She has had a Persistent enlargement, mostly in the midline Does not complain of any local pressure or discomfort, does have a little pressure when she is lying flat but no choking No difficulty swallowing Her thyroid enlargement has been unchanged in the last few years   PREDIABETES:   Her last fasting glucose previously has been as high as 129 last December Has  had a glucose tolerance test which did not show any increase after glucose load  Previously A1c has been consistently in the upper normal range On her last visit she was recommended starting metformin because of high fasting reading and to prevent progression  of diabetes However she refuses to do this and instead was motivated to start watching her diet much better She says that she is cutting back on sweets, starchy foods and has lost about 13 pounds since last December Although she had been recommended chair exercises she does not do this, still cannot walk because of knee pain and does not like to do water aerobics   More recently fasting glucose is 103 A1c is back down to normal now  Wt Readings from Last 3 Encounters:  05/09/17 171 lb 9.6 oz (77.8 kg)  04/11/17 170 lb (77.1 kg)  03/09/17 171 lb 3.2 oz (77.7 kg)    Lab Results  Component Value Date   HGBA1C 5.4 05/09/2017   HGBA1C 6.0 11/03/2016   HGBA1C 6.2 04/13/2016   Lab Results  Component Value Date   LDLCALC 78 04/11/2017   CREATININE 0.75 04/11/2017     Allergies as of 05/09/2017      Reactions   Other Other (See Comments)   Frozen Blood Plasma-caused patient to crash / cardiac arrest / CAN NEVER TAKE THIS AGAIN-PER MD'S.  ALL NARCOTICS- NAUSEA AND VOMITING    Adhesive [tape] Itching, Swelling, Rash, Other (See Comments)   Also pain at location   Codeine Nausea And Vomiting   Bactrim [sulfamethoxazole-trimethoprim]    Muscle aches   Cephalexin Hives   Macrobid [nitrofurantoin Monohyd Macro] Nausea And Vomiting   Tamiflu [  oseltamivir Phosphate]       Medication List       Accurate as of 05/09/17 12:50 PM. Always use your most recent med list.          acetaminophen 500 MG tablet Commonly known as:  TYLENOL Take 500 mg by mouth every 6 (six) hours as needed for moderate pain.   aspirin 81 MG tablet Take 81 mg by mouth daily.   CENTRUM SILVER ADULT 50+ PO Take 1 tablet by mouth daily.   clindamycin 1 % gel Commonly known as:  CLINDAGEL Apply 1 application topically daily.   clopidogrel 75 MG tablet Commonly known as:  PLAVIX TAKE 1 TABLET DAILY   COREG CR 40 MG 24 hr capsule Generic drug:  carvedilol TAKE 1 CAPSULE EVERY EVENING   fluticasone  50 MCG/ACT nasal spray Commonly known as:  FLONASE Place 2 sprays into both nostrils daily.   hydrochlorothiazide 12.5 MG capsule Commonly known as:  MICROZIDE TAKE 1 CAPSULE DAILY   isosorbide mononitrate 60 MG 24 hr tablet Commonly known as:  IMDUR Take 1 tablet (60 mg total) by mouth daily.   lisinopril 40 MG tablet Commonly known as:  PRINIVIL,ZESTRIL Take 1 tablet (40 mg total) by mouth daily.   Methylsulfonylmethane 1000 MG Caps Take 1,000 mg by mouth 2 (two) times daily. MSM for joint health   nitroGLYCERIN 0.4 MG SL tablet Commonly known as:  NITROSTAT DISSOLVE 1 TABLET UNDER THE TONGUE EVERY 5 MINUTES AS NEEDED FOR CHEST PAIN   rosuvastatin 20 MG tablet Commonly known as:  CRESTOR Take 1 tablet (20 mg total) by mouth at bedtime.   SYNTHROID 50 MCG tablet Generic drug:  levothyroxine DAW; take 1.5 tabs(54mcg) on Monday, Wednesday and Friday and 1 tab on Tuesday, Thursday, Saturday and Sunday   vitamin C 500 MG tablet Commonly known as:  ASCORBIC ACID Take 1,000 mg by mouth 2 (two) times daily.       Allergies:  Allergies  Allergen Reactions  . 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   ALL NARCOTICS- NAUSEA AND VOMITING   . Adhesive [Tape] Itching, Swelling, Rash and Other (See Comments)    Also pain at location  . Codeine Nausea And Vomiting  . Bactrim [Sulfamethoxazole-Trimethoprim]     Muscle aches  . Cephalexin Hives  . Macrobid [Nitrofurantoin Monohyd Macro] Nausea And Vomiting  . Tamiflu [Oseltamivir Phosphate]     Past Medical History:  Diagnosis Date  . Anemia   . Arthritis   . Blood transfusion without reported diagnosis 1957  . CAD (coronary artery disease)    a. NSTEMI 2004 with thrombus in Cx and mod RCA. b. Nonischemic nuc 2008. c. mild NSTEMI 01/2013 (trop 0.42) -> cath with unchanged mod LAD & RCA disease, for continued medical therapy.  . Cancer  ([place])   . Cataract   . H/O Hashimoto thyroiditis   . Hyperlipidemia   . HYPERTENSION    a. Also has h/o intermittentl low BP.  Marland Kitchen Hypothyroidism   . Kidney stones   . Myocardial infarction ([place])   . Osteoporosis    no fractures  . Prediabetes   . QT prolongation    a. Prior EKGs QTc 474, but on 4/1 was 524 for unclear reason.  . Recurrent urinary tract infection   . Squamous cell carcinoma of skin 04/17/2015    Past Surgical History:  Procedure Laterality Date  .  APPENDECTOMY  1999  . Bilateral Arthroscopies of knees    . CARDIAC CATHETERIZATION     Moderate 3 vessel CAD by cath 05/2012  . CHOLECYSTECTOMY  02/2010  . JOINT REPLACEMENT  01/2010   L TKR - allusio  . LEFT HEART CATHETERIZATION WITH CORONARY ANGIOGRAM N/A 05/22/2012   Procedure: LEFT HEART CATHETERIZATION WITH CORONARY ANGIOGRAM;  Surgeon: Sinclair Grooms, MD;  Location: [facility] CATH LAB;  Service: Cardiovascular;  Laterality: N/A;  . LEFT HEART CATHETERIZATION WITH CORONARY ANGIOGRAM N/A 02/20/2014   Procedure: LEFT HEART CATHETERIZATION WITH CORONARY ANGIOGRAM;  Surgeon: Peter M Martinique, MD;  Location: [facility] CATH LAB;  Service: Cardiovascular;  Laterality: N/A;  . TUBAL LIGATION      Family History  Problem Relation Age of Onset  . Allergies Father   . Cancer Father   . Coronary artery disease Mother 84  . Heart disease Mother   . CAD Sister 84  . Cancer Sister   . AAA (abdominal aortic aneurysm) Sister   . Hypertension Sister   . Hyperlipidemia Sister   . Diabetes Sister   . Heart disease Sister   . Diabetes Maternal Aunt     Social History:  reports that she has never smoked. She has never used smokeless tobacco. She reports that she does not drink alcohol or use drugs.  REVIEW Of SYSTEMS:  History of CAD, followed regularly by cardiologist  Hypertension: Is on a 2 drug regimen, Followed by PCP and cardiologist   Examination:   BP 136/78   Pulse 68   Ht 5\' 7"  (1.702 m)   Wt 171 lb 9.6 oz (77.8 kg)    SpO2 98%   BMI 26.88 kg/m    Thyroid is enlarged diffusely but mostly in the midline extending bilaterally It is firm but no nodules felt Lower neck circumference is 41 cm, Unchanged from before No lymphadenopathy in the neck    Assessments/Plan   Impaired fasting glucose:  She has done dramatically better with losing weight from changing her diet A1c is 5.4 which is normal and fasting glucose is almost normal at 103 Again discussed need for exercise and at least she can do chair exercises  She will come back in about 6 months for follow-up  Multinodular goiter, long-standing, clinically stable with no significant local pressure symptoms Examination shows a goiter to be the same in size  ?  Hypothyroidism: TSH is normal and will continue the same dose of 50 g, 8-1/2 tablets a week, she likely has mild hypothyroidism as she is symptomatic with reducing the dose in the past    Kajuan Guyton 05/09/2017, 12:50 PM

## 2017-05-09 NOTE — Telephone Encounter (Signed)
Please advise 

## 2017-05-10 ENCOUNTER — Other Ambulatory Visit: Payer: Self-pay | Admitting: Endocrinology

## 2017-05-14 ENCOUNTER — Other Ambulatory Visit: Payer: Self-pay | Admitting: Endocrinology

## 2017-05-14 NOTE — Telephone Encounter (Signed)
Order is in from Dr Dennard Schaumann; please call to schedule

## 2017-05-16 NOTE — Telephone Encounter (Signed)
Please see previous note and schedule patient. Thank you!

## 2017-05-17 NOTE — Telephone Encounter (Signed)
[facility] on [address] and scheduled bone density test for July 5th, 2018 at 10:30am. Called patient and left a voice message with appointment time and location and to please call us back if she needs to reschedule.

## 2017-05-26 ENCOUNTER — Ambulatory Visit (INDEPENDENT_AMBULATORY_CARE_PROVIDER_SITE_OTHER)
Admission: RE | Admit: 2017-05-26 | Discharge: 2017-05-26 | Disposition: A | Discharge status: Home / Self Care | Payer: Medicare Other | Source: Ambulatory Visit | Attending: Family Medicine | Admitting: Family Medicine

## 2017-05-26 ENCOUNTER — Other Ambulatory Visit: Payer: Self-pay | Admitting: Endocrinology

## 2017-05-26 ENCOUNTER — Telehealth: Payer: Self-pay | Admitting: Endocrinology

## 2017-05-26 DIAGNOSIS — Z780 Asymptomatic menopausal state: Secondary | ICD-10-CM

## 2017-05-26 DIAGNOSIS — M810 Age-related osteoporosis without current pathological fracture: Secondary | ICD-10-CM

## 2017-05-26 NOTE — Telephone Encounter (Signed)
Xray-Bone density calling again asking for the order to be put in the system by Dr. Dwyane Dee.

## 2017-05-26 NOTE — Telephone Encounter (Signed)
Xray- Bone density called [phone]- please return call

## 2017-05-26 NOTE — Telephone Encounter (Signed)
This is the Skype I sent to you. I spoke with the [facility] lab dept and she stated they are not able to accept orders from Dr. Dennard Schaumann since not in the [facility] system. Please advise.

## 2017-05-27 ENCOUNTER — Other Ambulatory Visit: Payer: Self-pay | Admitting: Endocrinology

## 2017-05-27 DIAGNOSIS — Z780 Asymptomatic menopausal state: Secondary | ICD-10-CM

## 2017-05-27 NOTE — Telephone Encounter (Signed)
Please enter Bone Density order requested. Thanks!

## 2017-05-27 NOTE — Telephone Encounter (Signed)
Xray is calling again for an order for the bone density please place the order, the test has already been done

## 2017-06-09 NOTE — Progress Notes (Signed)
Please call to let patient know that the bone density shows only borderline abnormal level at the right hip.  No follow-up bone density will be needed, continue calcium and vitamin D

## 2017-07-05 ENCOUNTER — Encounter: Payer: Self-pay | Admitting: Family Medicine

## 2017-07-05 ENCOUNTER — Ambulatory Visit (INDEPENDENT_AMBULATORY_CARE_PROVIDER_SITE_OTHER): Payer: Medicare Other | Admitting: Family Medicine

## 2017-07-05 ENCOUNTER — Other Ambulatory Visit: Payer: Self-pay | Admitting: Family Medicine

## 2017-07-05 VITALS — BP 120/70 | HR 72 | Temp 97.9°F | Resp 14 | Ht 67.0 in | Wt 170.0 lb

## 2017-07-05 DIAGNOSIS — N3000 Acute cystitis without hematuria: Secondary | ICD-10-CM

## 2017-07-05 DIAGNOSIS — I2510 Atherosclerotic heart disease of native coronary artery without angina pectoris: Secondary | ICD-10-CM

## 2017-07-05 DIAGNOSIS — R300 Dysuria: Secondary | ICD-10-CM | POA: Diagnosis not present

## 2017-07-05 LAB — URINALYSIS, MICROSCOPIC ONLY
CASTS: NONE SEEN [LPF]
CRYSTALS: NONE SEEN [HPF]
Squamous Epithelial / LPF: NONE SEEN [HPF] (ref <=5)
YEAST: NONE SEEN [HPF]

## 2017-07-05 LAB — URINALYSIS, ROUTINE W REFLEX MICROSCOPIC
Bilirubin Urine: NEGATIVE
GLUCOSE, UA: NEGATIVE
Ketones, ur: NEGATIVE
Nitrite: NEGATIVE
Protein, ur: NEGATIVE
SPECIFIC GRAVITY, URINE: 1.015 (ref 1.001–1.035)
pH: 6 (ref 5.0–8.0)

## 2017-07-05 MED ORDER — CIPROFLOXACIN HCL 500 MG PO TABS: 500.0000 mg | ORAL_TABLET | Freq: Two times a day (BID) | ORAL | 0 refills | Status: DC

## 2017-07-05 MED ORDER — ROSUVASTATIN CALCIUM 20 MG PO TABS: 20.0000 mg | ORAL_TABLET | Freq: Every day | ORAL | 3 refills | Status: DC

## 2017-07-05 NOTE — Progress Notes (Signed)
Subjective:    Patient ID: Lindsey Erickson, female    DOB: 1932-04-05, 83 y.o.   MRN: [id number]  HPI Symptoms began 48 hours ago with Urgency, frequency, dysuria. She denies high-grade fever. She does report some lower abdominal pressure. She denies any CVA tenderness but she does have some mild lower back discomfort. Urinalysis today shows trace blood and 3+ leukocyte esterase consistent with a urinary tract infection. Patient is allergic to sulfa as well as Keflex. Past Medical History:  Diagnosis Date  . Anemia   . Arthritis   . Blood transfusion without reported diagnosis 1957  . CAD (coronary artery disease)    a. NSTEMI 2004 with thrombus in Cx and mod RCA. b. Nonischemic nuc 2008. c. mild NSTEMI 01/2013 (trop 0.42) -> cath with unchanged mod LAD & RCA disease, for continued medical therapy.  . Cancer ([place])   . Cataract   . H/O Hashimoto thyroiditis   . Hyperlipidemia   . HYPERTENSION    a. Also has h/o intermittentl low BP.  Marland Kitchen Hypothyroidism   . Kidney stones   . Myocardial infarction ([place])   . Osteoporosis    no fractures  . Prediabetes   . QT prolongation    a. Prior EKGs QTc 474, but on 4/1 was 524 for unclear reason.  . Recurrent urinary tract infection   . Squamous cell carcinoma of skin 04/17/2015   Past Surgical History:  Procedure Laterality Date  . APPENDECTOMY  1999  . Bilateral Arthroscopies of knees    . CARDIAC CATHETERIZATION     Moderate 3 vessel CAD by cath 05/2012  . CHOLECYSTECTOMY  02/2010  . JOINT REPLACEMENT  01/2010   L TKR - allusio  . LEFT HEART CATHETERIZATION WITH CORONARY ANGIOGRAM N/A 05/22/2012   Procedure: LEFT HEART CATHETERIZATION WITH CORONARY ANGIOGRAM;  Surgeon: Sinclair Grooms, MD;  Location: [facility] CATH LAB;  Service: Cardiovascular;  Laterality: N/A;  . LEFT HEART CATHETERIZATION WITH CORONARY ANGIOGRAM N/A 02/20/2014   Procedure: LEFT HEART CATHETERIZATION WITH CORONARY ANGIOGRAM;  Surgeon: Peter M Martinique, MD;  Location: [facility] CATH LAB;   Service: Cardiovascular;  Laterality: N/A;  . TUBAL LIGATION     Current Outpatient Prescriptions on File Prior to Visit  Medication Sig Dispense Refill  . acetaminophen (TYLENOL) 500 MG tablet Take 500 mg by mouth every 6 (six) hours as needed for moderate pain.    . Ascorbic Acid (VITAMIN C) 500 MG tablet Take 1,000 mg by mouth 2 (two) times daily.     Marland Kitchen aspirin 81 MG tablet Take 81 mg by mouth daily.      . clindamycin (CLINDAGEL) 1 % gel Apply 1 application topically daily.    . clopidogrel (PLAVIX) 75 MG tablet TAKE 1 TABLET DAILY 90 tablet 3  . COREG CR 40 MG 24 hr capsule TAKE 1 CAPSULE EVERY EVENING 90 capsule 3  . fluticasone (FLONASE) 50 MCG/ACT nasal spray Place 2 sprays into both nostrils daily. 48 g 3  . hydrochlorothiazide (MICROZIDE) 12.5 MG capsule TAKE 1 CAPSULE DAILY (Patient taking differently: TAKE 1 CAPSULE MWF) 90 capsule 3  . isosorbide mononitrate (IMDUR) 60 MG 24 hr tablet Take 1 tablet (60 mg total) by mouth daily. 90 tablet 3  . lisinopril (PRINIVIL,ZESTRIL) 40 MG tablet Take 1 tablet (40 mg total) by mouth daily. 90 tablet 3  . Methylsulfonylmethane 1000 MG CAPS Take 1,000 mg by mouth 2 (two) times daily. MSM for joint health    . Multiple Vitamins-Minerals (CENTRUM  SILVER ADULT 50+ PO) Take 1 tablet by mouth daily.    . nitroGLYCERIN (NITROSTAT) 0.4 MG SL tablet DISSOLVE 1 TABLET UNDER THE TONGUE EVERY 5 MINUTES AS NEEDED FOR CHEST PAIN 25 tablet 1  . SYNTHROID 50 MCG tablet DAW; take 1.5 tabs(8mcg) on Monday, Wednesday and Friday and 1 tab on Tuesday, Thursday, Saturday and Sunday 102 tablet 3   No current facility-administered medications on file prior to visit.    Allergies  Allergen Reactions  . 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   ALL NARCOTICS- NAUSEA AND VOMITING   . Adhesive [Tape] Itching, Swelling, Rash and Other (See Comments)    Also pain at location  .  Codeine Nausea And Vomiting  . Bactrim [Sulfamethoxazole-Trimethoprim]     Muscle aches  . Cephalexin Hives  . Macrobid [Nitrofurantoin Monohyd Macro] Nausea And Vomiting  . Tamiflu [Oseltamivir Phosphate]    Social History   Social History  . Marital status: Married    Spouse name: N/A  . Number of children: N/A  . Years of education: N/A   Occupational History  . Not on file.   Social History Main Topics  . Smoking status: Never Smoker  . Smokeless tobacco: Never Used  . Alcohol use No  . Drug use: No  . Sexual activity: Not Currently   Other Topics Concern  . Not on file   Social History Narrative   Married, 2 children. Retired from [employer]  Genitourinary: Positive for dysuria.  All other systems reviewed and are negative.      Objective:   Physical Exam  Eyes: Conjunctivae are normal.  Neck: Neck supple.  Cardiovascular: Normal rate, regular rhythm and normal heart sounds.   Pulmonary/Chest: Effort normal and breath sounds normal. No respiratory distress. She has no wheezes. She has no rales.  Abdominal: Soft. Bowel sounds are normal. She exhibits no distension. There is no tenderness. There is no rebound.          Assessment & Plan:  Dysuria - Plan: Urinalysis, Routine w reflex microscopic  Appears to have urinary tract infection. Begin Cipro 500 mg by mouth twice a day for 5 days dispense quantity sufficient.  Urine culture was sent.

## 2017-07-06 NOTE — Addendum Note (Signed)
Addended by: Sheral Flow on: 07/06/2017 07:59 AM   Modules accepted: Orders

## 2017-07-08 ENCOUNTER — Encounter: Payer: Self-pay | Admitting: Family Medicine

## 2017-07-08 LAB — URINE CULTURE

## 2017-07-17 ENCOUNTER — Other Ambulatory Visit: Payer: Self-pay | Admitting: Family Medicine

## 2017-08-24 ENCOUNTER — Encounter: Payer: Self-pay | Admitting: Family Medicine

## 2017-08-24 ENCOUNTER — Ambulatory Visit (INDEPENDENT_AMBULATORY_CARE_PROVIDER_SITE_OTHER): Payer: Medicare Other | Admitting: Family Medicine

## 2017-08-24 VITALS — BP 130/82 | HR 76 | Temp 98.4°F | Resp 16 | Ht 67.0 in | Wt 172.0 lb

## 2017-08-24 DIAGNOSIS — I959 Hypotension, unspecified: Secondary | ICD-10-CM | POA: Diagnosis not present

## 2017-08-24 DIAGNOSIS — I2510 Atherosclerotic heart disease of native coronary artery without angina pectoris: Secondary | ICD-10-CM

## 2017-08-24 DIAGNOSIS — Z23 Encounter for immunization: Secondary | ICD-10-CM | POA: Diagnosis not present

## 2017-08-24 DIAGNOSIS — N390 Urinary tract infection, site not specified: Secondary | ICD-10-CM

## 2017-08-24 LAB — URINALYSIS, ROUTINE W REFLEX MICROSCOPIC
BILIRUBIN URINE: NEGATIVE
GLUCOSE, UA: NEGATIVE
Hgb urine dipstick: NEGATIVE
KETONES UR: NEGATIVE
NITRITE: NEGATIVE
PH: 6 (ref 5.0–8.0)
Protein, ur: NEGATIVE
RBC / HPF: NONE SEEN /HPF (ref 0–2)
SPECIFIC GRAVITY, URINE: 1.015 (ref 1.001–1.03)

## 2017-08-24 LAB — MICROSCOPIC MESSAGE

## 2017-08-24 MED ORDER — CIPROFLOXACIN HCL 500 MG PO TABS: 500.0000 mg | ORAL_TABLET | Freq: Two times a day (BID) | ORAL | 0 refills | Status: DC

## 2017-08-24 MED ORDER — CIPROFLOXACIN HCL 500 MG PO TABS
500.0000 mg | ORAL_TABLET | Freq: Two times a day (BID) | ORAL | 0 refills | Status: DC
Start: 2017-08-24 — End: 2017-08-24

## 2017-08-24 MED ORDER — LISINOPRIL 40 MG PO TABS: 20.0000 mg | ORAL_TABLET | Freq: Every day | ORAL | 3 refills | Status: DC

## 2017-08-24 NOTE — Patient Instructions (Signed)
F/U as needed

## 2017-08-24 NOTE — Progress Notes (Signed)
   Subjective:    Patient ID: Lindsey Erickson, female    DOB: December 30, 1931, 83 y.o.   MRN: [id number]  Patient presents for Pain with urination and Flu Vaccine   Dysuria for the past few days, with frequency, no blood in urine, No abdominal  or back pain  THis is 3rd UTI this year, last treated for [facility] in August 2018.   Hypotension episodes- taking lisinopril 20mg  for past 2 days ,state her BP dropped down to 77/50, has had recurrent spells like this . She has told her cardiologist per report but recommend that she keep taking her current meds   Review Of Systems:  GEN- denies fatigue, fever, weight loss,weakness, recent illness HEENT- denies eye drainage, change in vision, nasal discharge, CVS- denies chest pain, palpitations RESP- denies SOB, cough, wheeze ABD- denies N/V, change in stools, abd pain GU- + dysuria, hematuria, dribbling, incontinence MSK- denies joint pain, muscle aches, injury Neuro- denies headache, +dizziness, syncope, seizure activity       Objective:    BP 130/82   Pulse 76   Temp 98.4 F (36.9 C) (Oral)   Resp 16   Ht 5\' 7"  (1.702 m)   Wt 172 lb (78 kg)   SpO2 98%   BMI 26.94 kg/m  GEN- NAD, alert and oriented x3 HEENT- PERRL, EOMI, non injected sclera, pink conjunctiva, MMM, oropharynx clear Neck- Supple, CVS- RRR, no murmur RESP-CTAB ABD-NABS,soft,NT,ND, no CVA tenderness EXT- No edema Pulses- Radial 2+        Assessment & Plan:      Problem List Items Addressed This Visit    None    Visit Diagnoses    Urinary tract infection without hematuria, site unspecified    -  Primary   Send for culture, Cipro x 7 days, based on prevous culture sensitvity we dicussed ,may need to go to urology   Relevant Orders   Urinalysis, Routine w reflex microscopic (Completed)   Urine Culture   Hypotension, unspecified hypotension type       will keep lisinopril at 20mg  and have her monitor BP, continue all other meds   Relevant Medications   lisinopril (PRINIVIL,ZESTRIL) 40 MG tablet   Needs flu shot       Relevant Orders   Flu Vaccine QUAD 36+ mos IM (Completed)      Note: This dictation was prepared with Dragon dictation along with smaller phrase technology. Any transcriptional errors that result from this process are unintentional.

## 2017-08-25 ENCOUNTER — Encounter: Payer: Self-pay | Admitting: Family Medicine

## 2017-08-26 ENCOUNTER — Other Ambulatory Visit: Payer: Self-pay | Admitting: Family Medicine

## 2017-08-26 DIAGNOSIS — N390 Urinary tract infection, site not specified: Secondary | ICD-10-CM

## 2017-08-26 LAB — URINE CULTURE
MICRO NUMBER:: 81099347
SPECIMEN QUALITY:: ADEQUATE

## 2017-09-18 ENCOUNTER — Encounter (HOSPITAL_COMMUNITY): Payer: Self-pay | Admitting: Emergency Medicine

## 2017-09-18 ENCOUNTER — Emergency Department (HOSPITAL_COMMUNITY)
Admission: EM | Admit: 2017-09-18 | Discharge: 2017-09-18 | Disposition: A | Discharge status: Home / Self Care | Payer: Medicare Other | Attending: Emergency Medicine | Admitting: Emergency Medicine

## 2017-09-18 DIAGNOSIS — Z85828 Personal history of other malignant neoplasm of skin: Secondary | ICD-10-CM | POA: Insufficient documentation

## 2017-09-18 DIAGNOSIS — Z79899 Other long term (current) drug therapy: Secondary | ICD-10-CM | POA: Insufficient documentation

## 2017-09-18 DIAGNOSIS — T783XXA Angioneurotic edema, initial encounter: Secondary | ICD-10-CM | POA: Diagnosis not present

## 2017-09-18 DIAGNOSIS — R600 Localized edema: Secondary | ICD-10-CM | POA: Diagnosis present

## 2017-09-18 DIAGNOSIS — I2510 Atherosclerotic heart disease of native coronary artery without angina pectoris: Secondary | ICD-10-CM | POA: Insufficient documentation

## 2017-09-18 DIAGNOSIS — Z96652 Presence of left artificial knee joint: Secondary | ICD-10-CM | POA: Insufficient documentation

## 2017-09-18 DIAGNOSIS — I10 Essential (primary) hypertension: Secondary | ICD-10-CM | POA: Diagnosis not present

## 2017-09-18 DIAGNOSIS — I252 Old myocardial infarction: Secondary | ICD-10-CM | POA: Diagnosis not present

## 2017-09-18 DIAGNOSIS — E039 Hypothyroidism, unspecified: Secondary | ICD-10-CM | POA: Insufficient documentation

## 2017-09-18 DIAGNOSIS — Z7902 Long term (current) use of antithrombotics/antiplatelets: Secondary | ICD-10-CM | POA: Insufficient documentation

## 2017-09-18 MED ORDER — PREDNISONE 10 MG (21) PO TBPK: ORAL_TABLET | ORAL | 0 refills | Status: DC

## 2017-09-18 MED ORDER — HYDROXYZINE HCL 25 MG PO TABS: 25.0000 mg | ORAL_TABLET | Freq: Four times a day (QID) | ORAL | 0 refills | Status: DC

## 2017-09-18 MED ORDER — FAMOTIDINE IN NACL 20-0.9 MG/50ML-% IV SOLN
20.0000 mg | Freq: Once | INTRAVENOUS | Status: AC
  Administered 2017-09-18: 20 mg via INTRAVENOUS
  Filled 2017-09-18: qty 50

## 2017-09-18 MED ORDER — SODIUM CHLORIDE 0.9 % IV SOLN
INTRAVENOUS | Status: DC
  Administered 2017-09-18: 14:00:00 via INTRAVENOUS

## 2017-09-18 MED ORDER — METHYLPREDNISOLONE SODIUM SUCC 125 MG IJ SOLR
125.0000 mg | Freq: Once | INTRAMUSCULAR | Status: AC
  Administered 2017-09-18: 125 mg via INTRAVENOUS
  Filled 2017-09-18: qty 2

## 2017-09-18 MED ORDER — SODIUM CHLORIDE 0.9 % IV BOLUS (SEPSIS)
1000.0000 mL | Freq: Once | INTRAVENOUS | Status: AC
  Administered 2017-09-18: 1000 mL via INTRAVENOUS

## 2017-09-18 NOTE — ED Triage Notes (Addendum)
Pt states she has had lower lip swelling that was first noted around 1015 this morning.  Took 50 mg of benadryl and placed ice on it for two hours.  No tongue swelling noted at this time.  Pt states she has a goiter and is unable to tell if her throat is tightening. Able to speak in full sentences. Takes lisinopril but has not had it today.

## 2017-09-18 NOTE — Discharge Instructions (Signed)
STOP LISINOPRIL.  Call your doctor in the morning to see what medication he would like to change you to for your blood pressure.

## 2017-09-18 NOTE — ED Provider Notes (Signed)
[facility] DEPT Provider Note   CSN: [id number] Arrival date & time: 09/18/17  1333     History   Chief Complaint Chief Complaint  Patient presents with  . Angioedema    HPI Lindsey Erickson is a 83 y.o. female.  Pt presents to the ED today with left lower lip swelling.  Pt noticed it around 1015 this morning while at church.  The pt took 50 mg of benadryl when she got home.  The pt applied an ice pack as well.  She does not feel like her tongue is swelling.  She does take lisinopril.      Past Medical History:  Diagnosis Date  . Anemia   . Arthritis   . Blood transfusion without reported diagnosis 1957  . CAD (coronary artery disease)    a. NSTEMI 2004 with thrombus in Cx and mod RCA. b. Nonischemic nuc 2008. c. mild NSTEMI 01/2013 (trop 0.42) -> cath with unchanged mod LAD & RCA disease, for continued medical therapy.  . Cancer ([place])   . Cataract   . H/O Hashimoto thyroiditis   . Hyperlipidemia   . HYPERTENSION    a. Also has h/o intermittentl low BP.  Marland Kitchen Hypothyroidism   . Kidney stones   . Myocardial infarction ([place])   . Osteoporosis    no fractures  . Prediabetes   . QT prolongation    a. Prior EKGs QTc 474, but on 4/1 was 524 for unclear reason.  . Recurrent urinary tract infection   . Squamous cell carcinoma of skin 04/17/2015    Patient Active Problem List   Diagnosis Date Noted  . Squamous cell carcinoma of skin 04/17/2015  . Cystitis 08/03/2014  . NSTEMI (non-ST elevated myocardial infarction) ([place]) 02/21/2014  . QT prolongation 02/21/2014  . CAD (coronary artery disease)   . Hyperlipidemia   . Multinodular goiter 10/03/2013  . Osteoarthritis of right knee   . Osteoporosis with fracture hx 02/15/2011  . Hypothyroid 02/15/2011  . Kidney stones   . Essential hypertension 06/22/2010  . Other diseases of lung, not elsewhere classified 02/25/2010    Past Surgical History:  Procedure Laterality Date  . APPENDECTOMY   1999  . Bilateral Arthroscopies of knees    . CARDIAC CATHETERIZATION     Moderate 3 vessel CAD by cath 05/2012  . CHOLECYSTECTOMY  02/2010  . JOINT REPLACEMENT  01/2010   L TKR - allusio  . LEFT HEART CATHETERIZATION WITH CORONARY ANGIOGRAM N/A 05/22/2012   Procedure: LEFT HEART CATHETERIZATION WITH CORONARY ANGIOGRAM;  Surgeon: Sinclair Grooms, MD;  Location: [facility] CATH LAB;  Service: Cardiovascular;  Laterality: N/A;  . LEFT HEART CATHETERIZATION WITH CORONARY ANGIOGRAM N/A 02/20/2014   Procedure: LEFT HEART CATHETERIZATION WITH CORONARY ANGIOGRAM;  Surgeon: Peter M Martinique, MD;  Location: [facility] CATH LAB;  Service: Cardiovascular;  Laterality: N/A;  . TUBAL LIGATION      OB History    No data available       Home Medications    Prior to Admission medications   Medication Sig Start Date End Date Taking? Authorizing Provider  acetaminophen (TYLENOL) 500 MG tablet Take 500 mg by mouth every 6 (six) hours as needed for moderate pain.   Yes [provider]  Ascorbic Acid (VITAMIN C) 500 MG tablet Take 1,000 mg by mouth 2 (two) times daily.    Yes [provider]  aspirin 81 MG tablet Take 81 mg by mouth daily.     Yes  [provider]  clindamycin (CLINDAGEL) 1 % gel Apply 1 application topically daily.   Yes [provider]  clopidogrel (PLAVIX) 75 MG tablet TAKE 1 TABLET DAILY 04/06/17  Yes Pickard, Cammie Mcgee, MD  COREG CR 40 MG 24 hr capsule TAKE 1 CAPSULE EVERY EVENING 11/30/16  Yes Susy Frizzle, MD  fluticasone (FLONASE) 50 MCG/ACT nasal spray Place 2 sprays into both nostrils daily. 11/11/16  Yes Susy Frizzle, MD  hydrochlorothiazide (MICROZIDE) 12.5 MG capsule TAKE 1 CAPSULE DAILY Patient taking differently: TAKE 1 CAPSULE MWF 11/30/16  Yes Susy Frizzle, MD  isosorbide mononitrate (IMDUR) 60 MG 24 hr tablet TAKE 1 TABLET DAILY 07/18/17  Yes Susy Frizzle, MD  lisinopril (PRINIVIL,ZESTRIL) 40 MG tablet Take 0.5 tablets (20 mg total) by mouth  daily. 08/24/17  Yes Magnolia, Modena Nunnery, MD  Methylsulfonylmethane 1000 MG CAPS Take 1,000 mg by mouth 2 (two) times daily. MSM for joint health   Yes [provider]  Multiple Vitamins-Minerals (CENTRUM SILVER ADULT 50+ PO) Take 1 tablet by mouth daily.   Yes [provider]  nitroGLYCERIN (NITROSTAT) 0.4 MG SL tablet DISSOLVE 1 TABLET UNDER THE TONGUE EVERY 5 MINUTES AS NEEDED FOR CHEST PAIN 01/27/17  Yes Susy Frizzle, MD  rosuvastatin (CRESTOR) 20 MG tablet Take 1 tablet (20 mg total) by mouth at bedtime. 07/05/17  Yes Susy Frizzle, MD  SYNTHROID 50 MCG tablet DAW; take 1.5 tabs(80mcg) on Monday, Wednesday and Friday and 1 tab on Tuesday, Thursday, Saturday and Sunday 11/11/16  Yes Elayne Snare, MD  ciprofloxacin (CIPRO) 500 MG tablet Take 1 tablet (500 mg total) by mouth 2 (two) times daily. 08/24/17   Alycia Rossetti, MD  hydrOXYzine (ATARAX/VISTARIL) 25 MG tablet Take 1 tablet (25 mg total) by mouth every 6 (six) hours. 09/18/17   Isla Pence, MD  predniSONE (STERAPRED UNI-PAK 21 TAB) 10 MG (21) TBPK tablet Take 6 tabs by mouth daily  for 2 days, then 5 tabs for 2 days, then 4 tabs for 2 days, then 3 tabs for 2 days, 2 tabs for 2 days, then 1 tab by mouth daily for 2 days 09/19/17   Isla Pence, MD    Family History Family History  Problem Relation Age of Onset  . Allergies Father   . Cancer Father   . Coronary artery disease Mother 44  . Heart disease Mother   . CAD Sister 49  . Cancer Sister   . AAA (abdominal aortic aneurysm) Sister   . Hypertension Sister   . Hyperlipidemia Sister   . Diabetes Sister   . Heart disease Sister   . Diabetes Maternal Aunt     Social History Social History  Substance Use Topics  . Smoking status: Never Smoker  . Smokeless tobacco: Never Used  . Alcohol use No     Allergies   Other; Adhesive [tape]; Codeine; Bactrim [sulfamethoxazole-trimethoprim]; Cephalexin; Macrobid [nitrofurantoin monohyd macro]; and  Tamiflu [oseltamivir phosphate]   Review of Systems Review of Systems  HENT:       Left lip swelling  All other systems reviewed and are negative.    Physical Exam Updated Vital Signs BP 125/78 (BP Location: Left Arm)   Pulse 81   Temp 98.3 F (36.8 C) (Oral)   Resp 16   Ht 5\' 7"  (1.702 m)   Wt 76.7 kg (169 lb)   SpO2 97%   BMI 26.47 kg/m   Physical Exam  Constitutional: She is oriented to person,  place, and time. She appears well-developed and well-nourished.  HENT:  Head: Normocephalic and atraumatic.  Right Ear: External ear normal.  Left Ear: External ear normal.  Nose: Nose normal.  Mouth/Throat: Oropharynx is clear and moist.  Left lower lip swelling.  No tongue or posterior pharynx involvement.  Eyes: Pupils are equal, round, and reactive to light. Conjunctivae and EOM are normal.  Neck: Normal range of motion. Neck supple.  Cardiovascular: Normal rate, regular rhythm, normal heart sounds and intact distal pulses.   Pulmonary/Chest: Effort normal and breath sounds normal.  Abdominal: Soft. Bowel sounds are normal.  Musculoskeletal: Normal range of motion.  Neurological: She is alert and oriented to person, place, and time.  Skin: Skin is warm. Capillary refill takes less than 2 seconds.  Psychiatric: She has a normal mood and affect. Her behavior is normal. Judgment and thought content normal.  Nursing note and vitals reviewed.    ED Treatments / Results  Labs (all labs ordered are listed, but only abnormal results are displayed) Labs Reviewed - No data to display  EKG  EKG Interpretation None       Radiology No results found.  Procedures Procedures (including critical care time)  Medications Ordered in ED Medications  sodium chloride 0.9 % bolus 1,000 mL (1,000 mLs Intravenous New Bag/Given 09/18/17 1405)    And  0.9 %  sodium chloride infusion ( Intravenous New Bag/Given 09/18/17 1405)  methylPREDNISolone sodium succinate (SOLU-MEDROL) 125  mg/2 mL injection 125 mg (125 mg Intravenous Given 09/18/17 1405)  famotidine (PEPCID) IVPB 20 mg premix (0 mg Intravenous Stopped 09/18/17 1524)     Initial Impression / Assessment and Plan / ED Course  I have reviewed the triage vital signs and the nursing notes.  Pertinent labs & imaging results that were available during my care of the patient were reviewed by me and considered in my medical decision making (see chart for details).    Pt still has some swelling, but it has improved.  She has no swelling to her tongue or to her posterior pharynx.  Pt knows to stop her lisinopril.  She is to call her doctor tomorrow to ask what med to take instead for her bp.  Pt is to return for worsening of sx. She is stable for d/c.  Final Clinical Impressions(s) / ED Diagnoses   Final diagnoses:  Angioedema, initial encounter    New Prescriptions New Prescriptions   HYDROXYZINE (ATARAX/VISTARIL) 25 MG TABLET    Take 1 tablet (25 mg total) by mouth every 6 (six) hours.   PREDNISONE (STERAPRED UNI-PAK 21 TAB) 10 MG (21) TBPK TABLET    Take 6 tabs by mouth daily  for 2 days, then 5 tabs for 2 days, then 4 tabs for 2 days, then 3 tabs for 2 days, 2 tabs for 2 days, then 1 tab by mouth daily for 2 days     Isla Pence, MD 09/18/17 1615

## 2017-09-23 ENCOUNTER — Ambulatory Visit (INDEPENDENT_AMBULATORY_CARE_PROVIDER_SITE_OTHER): Payer: Medicare Other | Admitting: Family Medicine

## 2017-09-23 ENCOUNTER — Encounter: Payer: Self-pay | Admitting: Family Medicine

## 2017-09-23 ENCOUNTER — Other Ambulatory Visit: Payer: Medicare Other

## 2017-09-23 VITALS — BP 132/70 | HR 76 | Temp 97.8°F | Resp 16 | Ht 67.0 in

## 2017-09-23 DIAGNOSIS — I2510 Atherosclerotic heart disease of native coronary artery without angina pectoris: Secondary | ICD-10-CM | POA: Diagnosis not present

## 2017-09-23 DIAGNOSIS — N390 Urinary tract infection, site not specified: Secondary | ICD-10-CM

## 2017-09-23 DIAGNOSIS — T783XXA Angioneurotic edema, initial encounter: Secondary | ICD-10-CM

## 2017-09-23 NOTE — Progress Notes (Signed)
Subjective:    Patient ID: Lindsey Erickson, female    DOB: 11-30-1931, 83 y.o.   MRN: [id number]  HPI  Patient went to the emergency room Sunday with sudden onset of swelling her lower lip.  There was no inciting event or food that she can recollect.  She definitely had angioedema.  Lip was swollen and tense and.  In the emergency room, the patient was treated with Benadryl as well as steroids.  The swelling has subsided back to normal in the lip this morning.  Patient has been on lisinopril for 14 years given her history of coronary artery disease.  It was suspected that this was the cause of the angioedema.  She is no longer taking that medication.  Her blood pressure today is adequately controlled even off the medication Past Medical History:  Diagnosis Date  . Anemia   . Arthritis   . Blood transfusion without reported diagnosis 1957  . CAD (coronary artery disease)    a. NSTEMI 2004 with thrombus in Cx and mod RCA. b. Nonischemic nuc 2008. c. mild NSTEMI 01/2013 (trop 0.42) -> cath with unchanged mod LAD & RCA disease, for continued medical therapy.  . Cancer ([place])   . Cataract   . H/O Hashimoto thyroiditis   . Hyperlipidemia   . HYPERTENSION    a. Also has h/o intermittentl low BP.  Marland Kitchen Hypothyroidism   . Kidney stones   . Myocardial infarction ([place])   . Osteoporosis    no fractures  . Prediabetes   . QT prolongation    a. Prior EKGs QTc 474, but on 4/1 was 524 for unclear reason.  . Recurrent urinary tract infection   . Squamous cell carcinoma of skin 04/17/2015   Past Surgical History:  Procedure Laterality Date  . APPENDECTOMY  1999  . Bilateral Arthroscopies of knees    . CARDIAC CATHETERIZATION     Moderate 3 vessel CAD by cath 05/2012  . CHOLECYSTECTOMY  02/2010  . JOINT REPLACEMENT  01/2010   L TKR - allusio  . LEFT HEART CATHETERIZATION WITH CORONARY ANGIOGRAM N/A 05/22/2012   Procedure: LEFT HEART CATHETERIZATION WITH CORONARY ANGIOGRAM;  Surgeon: Sinclair Grooms,  MD;  Location: [facility] CATH LAB;  Service: Cardiovascular;  Laterality: N/A;  . LEFT HEART CATHETERIZATION WITH CORONARY ANGIOGRAM N/A 02/20/2014   Procedure: LEFT HEART CATHETERIZATION WITH CORONARY ANGIOGRAM;  Surgeon: Peter M Martinique, MD;  Location: [facility] CATH LAB;  Service: Cardiovascular;  Laterality: N/A;  . TUBAL LIGATION     Current Outpatient Prescriptions on File Prior to Visit  Medication Sig Dispense Refill  . acetaminophen (TYLENOL) 500 MG tablet Take 500 mg by mouth every 6 (six) hours as needed for moderate pain.    . Ascorbic Acid (VITAMIN C) 500 MG tablet Take 1,000 mg by mouth 2 (two) times daily.     Marland Kitchen aspirin 81 MG tablet Take 81 mg by mouth daily.      . clindamycin (CLINDAGEL) 1 % gel Apply 1 application topically daily.    . clopidogrel (PLAVIX) 75 MG tablet TAKE 1 TABLET DAILY 90 tablet 3  . COREG CR 40 MG 24 hr capsule TAKE 1 CAPSULE EVERY EVENING 90 capsule 3  . fluticasone (FLONASE) 50 MCG/ACT nasal spray Place 2 sprays into both nostrils daily. 48 g 3  . hydrochlorothiazide (MICROZIDE) 12.5 MG capsule TAKE 1 CAPSULE DAILY (Patient taking differently: TAKE 1 CAPSULE MWF) 90 capsule 3  . isosorbide mononitrate (IMDUR) 60 MG 24 hr  tablet TAKE 1 TABLET DAILY 90 tablet 3  . Methylsulfonylmethane 1000 MG CAPS Take 1,000 mg by mouth 2 (two) times daily. MSM for joint health    . Multiple Vitamins-Minerals (CENTRUM SILVER ADULT 50+ PO) Take 1 tablet by mouth daily.    . nitroGLYCERIN (NITROSTAT) 0.4 MG SL tablet DISSOLVE 1 TABLET UNDER THE TONGUE EVERY 5 MINUTES AS NEEDED FOR CHEST PAIN 25 tablet 1  . predniSONE (STERAPRED UNI-PAK 21 TAB) 10 MG (21) TBPK tablet Take 6 tabs by mouth daily  for 2 days, then 5 tabs for 2 days, then 4 tabs for 2 days, then 3 tabs for 2 days, 2 tabs for 2 days, then 1 tab by mouth daily for 2 days 42 tablet 0  . rosuvastatin (CRESTOR) 20 MG tablet Take 1 tablet (20 mg total) by mouth at bedtime. 90 tablet 3  . SYNTHROID 50 MCG tablet DAW; take 1.5  tabs(79mcg) on Monday, Wednesday and Friday and 1 tab on Tuesday, Thursday, Saturday and Sunday 102 tablet 3  . hydrOXYzine (ATARAX/VISTARIL) 25 MG tablet Take 1 tablet (25 mg total) by mouth every 6 (six) hours. (Patient not taking: Reported on 09/23/2017) 12 tablet 0  . lisinopril (PRINIVIL,ZESTRIL) 40 MG tablet Take 0.5 tablets (20 mg total) by mouth daily. (Patient not taking: Reported on 09/23/2017) 90 tablet 3   No current facility-administered medications on file prior to visit.    Allergies  Allergen Reactions  . 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   ALL NARCOTICS- NAUSEA AND VOMITING   . Adhesive [Tape] Itching, Swelling, Rash and Other (See Comments)    Also pain at location  . Codeine Nausea And Vomiting  . Bactrim [Sulfamethoxazole-Trimethoprim]     Muscle aches  . Cephalexin Hives  . Lisinopril Swelling  . Macrobid [Nitrofurantoin Monohyd Macro] Nausea And Vomiting  . Tamiflu [Oseltamivir Phosphate]    Social History   Social History  . Marital status: Married    Spouse name: N/A  . Number of children: N/A  . Years of education: N/A   Occupational History  . Not on file.   Social History Main Topics  . Smoking status: Never Smoker  . Smokeless tobacco: Never Used  . Alcohol use No  . Drug use: No  . Sexual activity: Not Currently   Other Topics Concern  . Not on file   Social History Narrative   Married, 2 children. Retired from [employer] other systems reviewed and are negative.      Objective:   Physical Exam  Constitutional: She appears well-developed and well-nourished.  HENT:  Mouth/Throat: Oropharynx is clear and moist.  Neck: Neck supple. No tracheal deviation present. No thyromegaly present.  Cardiovascular: Normal rate, regular rhythm, normal heart sounds and intact distal pulses.  Exam reveals no gallop and no  friction rub.   No murmur heard. Pulmonary/Chest: Effort normal and breath sounds normal. No stridor. No respiratory distress. She has no wheezes. She has no rales.  Abdominal: Soft. Bowel sounds are normal.  Lymphadenopathy:    She has no cervical adenopathy.  Vitals reviewed.         Assessment & Plan:  Angioedema, initial encounter  I believe the lisinopril was most definitely the source of the angioedema.  I recommended discontinuation of that medication altogether and avoiding ACE inhibitors or ARB use in  the future.  Patient's blood pressure at the present time is acceptable.  I have asked her to check her blood pressure every day.  If her blood pressure consistently rises greater than 140/90, I would increase her use of hydrochlorothiazide to every day to compensate.  Patient is comfortable with this plan and will monitor her blood pressure daily

## 2017-09-23 NOTE — Addendum Note (Signed)
Addended by: Shary Decamp B on: 09/23/2017 01:09 PM   Modules accepted: Orders

## 2017-09-24 LAB — URINE CULTURE
MICRO NUMBER:: 81232888
Result:: NO GROWTH
SPECIMEN QUALITY: ADEQUATE

## 2017-10-15 ENCOUNTER — Other Ambulatory Visit: Payer: Self-pay | Admitting: Family Medicine

## 2017-10-17 NOTE — Telephone Encounter (Signed)
Medication refilled per protocol. 

## 2017-11-05 ENCOUNTER — Encounter (HOSPITAL_COMMUNITY): Payer: Self-pay

## 2017-11-05 ENCOUNTER — Other Ambulatory Visit: Payer: Self-pay

## 2017-11-05 ENCOUNTER — Emergency Department (HOSPITAL_COMMUNITY)
Admission: EM | Admit: 2017-11-05 | Discharge: 2017-11-06 | Disposition: A | Discharge status: Home / Self Care | Payer: Medicare Other | Attending: Emergency Medicine | Admitting: Emergency Medicine

## 2017-11-05 DIAGNOSIS — E039 Hypothyroidism, unspecified: Secondary | ICD-10-CM | POA: Insufficient documentation

## 2017-11-05 DIAGNOSIS — I10 Essential (primary) hypertension: Secondary | ICD-10-CM | POA: Diagnosis not present

## 2017-11-05 DIAGNOSIS — Z79899 Other long term (current) drug therapy: Secondary | ICD-10-CM | POA: Diagnosis not present

## 2017-11-05 DIAGNOSIS — Z7982 Long term (current) use of aspirin: Secondary | ICD-10-CM | POA: Insufficient documentation

## 2017-11-05 DIAGNOSIS — I2510 Atherosclerotic heart disease of native coronary artery without angina pectoris: Secondary | ICD-10-CM | POA: Insufficient documentation

## 2017-11-05 DIAGNOSIS — Z7901 Long term (current) use of anticoagulants: Secondary | ICD-10-CM | POA: Diagnosis not present

## 2017-11-05 DIAGNOSIS — T783XXA Angioneurotic edema, initial encounter: Secondary | ICD-10-CM | POA: Insufficient documentation

## 2017-11-05 DIAGNOSIS — R600 Localized edema: Secondary | ICD-10-CM | POA: Diagnosis present

## 2017-11-05 MED ORDER — DEXAMETHASONE SODIUM PHOSPHATE 10 MG/ML IJ SOLN
10.0000 mg | Freq: Once | INTRAMUSCULAR | Status: AC
  Administered 2017-11-05: 10 mg via INTRAMUSCULAR
  Filled 2017-11-05: qty 1

## 2017-11-05 NOTE — ED Triage Notes (Signed)
Pt started  Having lip swelling about 6pm tonight, she had angioedema about 6 weeks ago from lisinopril at which time they stopped the medication and didn't replace it with anything Pt states theres no new medication at all

## 2017-11-05 NOTE — ED Notes (Signed)
Patient states this happened 5 weeks ago and md took her off her blood pressure medications. Patient bottom right lip is swollen. Patient states she took pepcid and benadryl at home.

## 2017-11-05 NOTE — ED Triage Notes (Signed)
Pt has had benedryl and pepcid prior to arrival

## 2017-11-06 MED ORDER — EPINEPHRINE 0.3 MG/0.3ML IJ SOAJ: 0.3000 mg | Freq: Once | INTRAMUSCULAR | 1 refills | Status: AC

## 2017-11-06 MED ORDER — PREDNISONE 10 MG PO TABS: 60.0000 mg | ORAL_TABLET | Freq: Every day | ORAL | 0 refills | Status: DC

## 2017-11-06 NOTE — ED Provider Notes (Signed)
[place] DEPT Provider Note   CSN: [id number] Arrival date & time: 11/05/17  1951     History   Chief Complaint No chief complaint on file.   HPI Lindsey Erickson is a 83 y.o. female.  HPI  Patient with history of coronary artery disease and a recent visit for angioedema thought to be due to lisinopril comes in with chief complaint of lip swelling. Patient reports that she started having swelling of her lip around 6 PM.  Patient is already stopped taking lisinopril, and does not recall any other unique exposures today.  Patient denies any difficulty in breathing, difficulty swallowing, wheezing, rash.  Patient took Benadryl prior to ER arrival and notes that her swelling has improved slightly.  Past Medical History:  Diagnosis Date  . Anemia   . Arthritis   . Blood transfusion without reported diagnosis 1957  . CAD (coronary artery disease)    a. NSTEMI 2004 with thrombus in Cx and mod RCA. b. Nonischemic nuc 2008. c. mild NSTEMI 01/2013 (trop 0.42) -> cath with unchanged mod LAD & RCA disease, for continued medical therapy.  . Cancer ([place])   . Cataract   . H/O Hashimoto thyroiditis   . Hyperlipidemia   . HYPERTENSION    a. Also has h/o intermittentl low BP.  Marland Kitchen Hypothyroidism   . Kidney stones   . Myocardial infarction ([place])   . Osteoporosis    no fractures  . Prediabetes   . QT prolongation    a. Prior EKGs QTc 474, but on 4/1 was 524 for unclear reason.  . Recurrent urinary tract infection   . Squamous cell carcinoma of skin 04/17/2015    Patient Active Problem List   Diagnosis Date Noted  . Squamous cell carcinoma of skin 04/17/2015  . Cystitis 08/03/2014  . NSTEMI (non-ST elevated myocardial infarction) ([place]) 02/21/2014  . QT prolongation 02/21/2014  . CAD (coronary artery disease)   . Hyperlipidemia   . Multinodular goiter 10/03/2013  . Osteoarthritis of right knee   . Osteoporosis with fracture hx 02/15/2011  .  Hypothyroid 02/15/2011  . Kidney stones   . Essential hypertension 06/22/2010  . Other diseases of lung, not elsewhere classified 02/25/2010    Past Surgical History:  Procedure Laterality Date  . APPENDECTOMY  1999  . Bilateral Arthroscopies of knees    . CARDIAC CATHETERIZATION     Moderate 3 vessel CAD by cath 05/2012  . CHOLECYSTECTOMY  02/2010  . JOINT REPLACEMENT  01/2010   L TKR - allusio  . LEFT HEART CATHETERIZATION WITH CORONARY ANGIOGRAM N/A 05/22/2012   Procedure: LEFT HEART CATHETERIZATION WITH CORONARY ANGIOGRAM;  Surgeon: Sinclair Grooms, MD;  Location: [facility] CATH LAB;  Service: Cardiovascular;  Laterality: N/A;  . LEFT HEART CATHETERIZATION WITH CORONARY ANGIOGRAM N/A 02/20/2014   Procedure: LEFT HEART CATHETERIZATION WITH CORONARY ANGIOGRAM;  Surgeon: Peter M Martinique, MD;  Location: [facility] CATH LAB;  Service: Cardiovascular;  Laterality: N/A;  . TUBAL LIGATION      OB History    No data available       Home Medications    Prior to Admission medications   Medication Sig Start Date End Date Taking? Authorizing Provider  acetaminophen (TYLENOL) 500 MG tablet Take 500 mg by mouth every 6 (six) hours as needed for moderate pain.    [provider]  Ascorbic Acid (VITAMIN C) 500 MG tablet Take 1,000 mg by mouth 2 (two) times daily.     [provider]  aspirin 81 MG tablet Take 81 mg by mouth daily.      [provider]  carvedilol (COREG CR) 40 MG 24 hr capsule TAKE 1 CAPSULE EVERY EVENING 10/17/17   Susy Frizzle, MD  clindamycin (CLINDAGEL) 1 % gel Apply 1 application topically daily.    [provider]  clopidogrel (PLAVIX) 75 MG tablet TAKE 1 TABLET DAILY 04/06/17   Susy Frizzle, MD  EPINEPHrine (EPIPEN 2-PAK) 0.3 mg/0.3 mL IJ SOAJ injection Inject 0.3 mLs (0.3 mg total) into the muscle once for 1 dose. 11/06/17 11/06/17  Varney Biles, MD  fluticasone (FLONASE) 50 MCG/ACT nasal spray Place 2 sprays into both nostrils daily.  11/11/16   Susy Frizzle, MD  hydrochlorothiazide (MICROZIDE) 12.5 MG capsule TAKE 1 CAPSULE DAILY Patient taking differently: TAKE 1 CAPSULE MWF 11/30/16   Susy Frizzle, MD  hydrOXYzine (ATARAX/VISTARIL) 25 MG tablet Take 1 tablet (25 mg total) by mouth every 6 (six) hours. Patient not taking: Reported on 09/23/2017 09/18/17   Isla Pence, MD  isosorbide mononitrate (IMDUR) 60 MG 24 hr tablet TAKE 1 TABLET DAILY 07/18/17   Susy Frizzle, MD  lisinopril (PRINIVIL,ZESTRIL) 40 MG tablet Take 0.5 tablets (20 mg total) by mouth daily. Patient not taking: Reported on 09/23/2017 08/24/17   Alycia Rossetti, MD  Methylsulfonylmethane 1000 MG CAPS Take 1,000 mg by mouth 2 (two) times daily. MSM for joint health    [provider]  Multiple Vitamins-Minerals (CENTRUM SILVER ADULT 50+ PO) Take 1 tablet by mouth daily.    [provider]  nitroGLYCERIN (NITROSTAT) 0.4 MG SL tablet DISSOLVE 1 TABLET UNDER THE TONGUE EVERY 5 MINUTES AS NEEDED FOR CHEST PAIN 01/27/17   Susy Frizzle, MD  predniSONE (DELTASONE) 10 MG tablet Take 6 tablets (60 mg total) by mouth daily. 11/06/17   Varney Biles, MD  rosuvastatin (CRESTOR) 20 MG tablet Take 1 tablet (20 mg total) by mouth at bedtime. 07/05/17   Susy Frizzle, MD  SYNTHROID 50 MCG tablet DAW; take 1.5 tabs(85mcg) on Monday, Wednesday and Friday and 1 tab on Tuesday, Thursday, Saturday and Sunday 11/11/16   Elayne Snare, MD    Family History Family History  Problem Relation Age of Onset  . Allergies Father   . Cancer Father   . Coronary artery disease Mother 85  . Heart disease Mother   . CAD Sister 41  . Cancer Sister   . AAA (abdominal aortic aneurysm) Sister   . Hypertension Sister   . Hyperlipidemia Sister   . Diabetes Sister   . Heart disease Sister   . Diabetes Maternal Aunt     Social History Social History   Tobacco Use  . Smoking status: Never Smoker  . Smokeless tobacco: Never Used  Substance Use  Topics  . Alcohol use: No    Alcohol/week: 0.0 oz  . Drug use: No     Allergies   Other; Adhesive [tape]; Codeine; Bactrim [sulfamethoxazole-trimethoprim]; Cephalexin; Lisinopril; Macrobid [nitrofurantoin monohyd macro]; and Tamiflu [oseltamivir phosphate]   Review of Systems Review of Systems  Constitutional: Positive for activity change.  Respiratory: Negative for wheezing.   Cardiovascular: Negative for chest pain.  Skin: Negative for rash.  Allergic/Immunologic: Negative for environmental allergies and food allergies.  All other systems reviewed and are negative.    Physical Exam Updated Vital Signs BP (!) 157/83 (BP Location: Left Arm)   Pulse 74   Temp 97.9 F (36.6 C) (Oral)   Resp  18   Ht 5\' 7"  (1.702 m)   Wt 76.7 kg (169 lb)   SpO2 98%   BMI 26.47 kg/m   Physical Exam  Constitutional: She is oriented to person, place, and time. She appears well-developed.  HENT:  Head: Normocephalic and atraumatic.   Slightly edematous lip on the right side.  Based the tongue is also mildly edematous.  Eyes: EOM are normal.  Neck: Normal range of motion. Neck supple.  Cardiovascular: Normal rate.  Pulmonary/Chest: Effort normal. She has no wheezes.  Abdominal: Bowel sounds are normal.  Neurological: She is alert and oriented to person, place, and time.  Skin: Skin is warm and dry.  Nursing note and vitals reviewed.    ED Treatments / Results  Labs (all labs ordered are listed, but only abnormal results are displayed) Labs Reviewed - No data to display  EKG  EKG Interpretation None       Radiology No results found.  Procedures Procedures (including critical care time)  Medications Ordered in ED Medications  dexamethasone (DECADRON) injection 10 mg (10 mg Intramuscular Given 11/05/17 2306)     Initial Impression / Assessment and Plan / ED Course  I have reviewed the triage vital signs and the nursing notes.  Pertinent labs & imaging results that  were available during my care of the patient were reviewed by me and considered in my medical decision making (see chart for details).  Clinical Course as of Nov 06 26  Sun Nov 06, 2017  0027 Patient has been monitored for more than 4 hours in the ED now, and her symptoms have gradually improved but not resolved.  Stable for discharge.  Strict return precautions have been discussed.  [AN]    Clinical Course User Index [AN] Varney Biles, MD    Patient comes in with lip angioedema.  Etiology unknown.  This is a second episode within the last 3 months, the prior episode was thought to be due to lisinopril which has been now discontinued.  Unfortunately patient symptoms are not severe, and she certainly does not have any respiratory compromise.  We will give patient appropriate medications in the ED.  She will be discharged with EpiPen.  Strict return precautions have been discussed and patient has been advised to see her primary care doctor or an allergist for further workup.  Final Clinical Impressions(s) / ED Diagnoses   Final diagnoses:  Angioedema of lips, initial encounter    ED Discharge Orders        Ordered    predniSONE (DELTASONE) 10 MG tablet  Daily     11/06/17 0021    EPINEPHrine (EPIPEN 2-PAK) 0.3 mg/0.3 mL IJ SOAJ injection   Once     11/06/17 Jinger Neighbors, MD 11/06/17 0028

## 2017-11-06 NOTE — Discharge Instructions (Signed)
We saw you in the ER after you had the allergic reaction.  The reaction is severe, however, it appears to be in control and there is no increased swelling or any difficulty in breathing noted. We are not sure what caused the reaction, and it is important for you to follow up with an allergist. Please take the medications prescribed. PLEASE RETURN TO THE ER IMMEDIATELY IN CASE YOU START HAVING WORSENING SWELLING, DIFFICULTY IN BREATHING ETC.  

## 2017-11-07 ENCOUNTER — Telehealth: Payer: Self-pay | Admitting: Family Medicine

## 2017-11-07 NOTE — Telephone Encounter (Signed)
Pt calling to report was at ED over the weekend for her Angioedema.  Was ordered to take 60 mg Prednisone daily for 5 straight days.  This is making her way to jittery and has not slept for 2 days now.  Has follow up appt with Dr Dennard Schaumann tomorrow.  Very hesitant to continue this high dose of Prednisone.  Says she only going to take 3 tablets today and will discuss with doctor tomorrow.

## 2017-11-07 NOTE — Telephone Encounter (Signed)
ok 

## 2017-11-08 ENCOUNTER — Other Ambulatory Visit: Payer: Medicare Other

## 2017-11-08 ENCOUNTER — Encounter: Payer: Self-pay | Admitting: Family Medicine

## 2017-11-08 ENCOUNTER — Ambulatory Visit (INDEPENDENT_AMBULATORY_CARE_PROVIDER_SITE_OTHER): Payer: Medicare Other | Admitting: Family Medicine

## 2017-11-08 VITALS — BP 162/88 | HR 78 | Temp 97.7°F | Resp 16 | Ht 67.0 in | Wt 173.0 lb

## 2017-11-08 DIAGNOSIS — T783XXA Angioneurotic edema, initial encounter: Secondary | ICD-10-CM

## 2017-11-08 DIAGNOSIS — I2510 Atherosclerotic heart disease of native coronary artery without angina pectoris: Secondary | ICD-10-CM | POA: Diagnosis not present

## 2017-11-08 MED ORDER — PREDNISONE 20 MG PO TABS: ORAL_TABLET | ORAL | 0 refills | Status: DC

## 2017-11-08 NOTE — Progress Notes (Signed)
Subjective:    Patient ID: Lindsey Erickson, female    DOB: 1932/07/31, 83 y.o.   MRN: [id number]  HPI 09/23/17 Patient went to the emergency room Sunday with sudden onset of swelling her lower lip.  There was no inciting event or food that she can recollect.  She definitely had angioedema.  Lip was swollen and tense and.  In the emergency room, the patient was treated with Benadryl as well as steroids.  The swelling has subsided back to normal in the lip this morning.  Patient has been on lisinopril for 14 years given her history of coronary artery disease.  It was suspected that this was the cause of the angioedema.  She is no longer taking that medication.  Her blood pressure today is adequately controlled even off the medication.  At that time, my plan was: I believe the lisinopril was most definitely the source of the angioedema.  I recommended discontinuation of that medication altogether and avoiding ACE inhibitors or ARB use in the future.  Patient's blood pressure at the present time is acceptable.  I have asked her to check her blood pressure every day.  If her blood pressure consistently rises greater than 140/90, I would increase her use of hydrochlorothiazide to every day to compensate.  Patient is comfortable with this plan and will monitor her blood pressure daily  11/08/17 Saturday, the patient developed swelling in her lower lip on the right side.  She has been off the ACE inhibitor for more than a month.  She denies any swelling in her tongue, stridor, or shortness of breath.  She started taking prednisone at the emergency room and was also given a prescription for an EpiPen.  Swelling resolved in just a few days on prednisone.  She denies any new ingestion.  She denies any new medication.  She denies any other agent that she could be having allergic reaction to. Past Medical History:  Diagnosis Date  . Anemia   . Arthritis   . Blood transfusion without reported diagnosis 1957  .  CAD (coronary artery disease)    a. NSTEMI 2004 with thrombus in Cx and mod RCA. b. Nonischemic nuc 2008. c. mild NSTEMI 01/2013 (trop 0.42) -> cath with unchanged mod LAD & RCA disease, for continued medical therapy.  . Cancer ([place])   . Cataract   . H/O Hashimoto thyroiditis   . Hyperlipidemia   . HYPERTENSION    a. Also has h/o intermittentl low BP.  Marland Kitchen Hypothyroidism   . Kidney stones   . Myocardial infarction ([place])   . Osteoporosis    no fractures  . Prediabetes   . QT prolongation    a. Prior EKGs QTc 474, but on 4/1 was 524 for unclear reason.  . Recurrent urinary tract infection   . Squamous cell carcinoma of skin 04/17/2015   Past Surgical History:  Procedure Laterality Date  . APPENDECTOMY  1999  . Bilateral Arthroscopies of knees    . CARDIAC CATHETERIZATION     Moderate 3 vessel CAD by cath 05/2012  . CHOLECYSTECTOMY  02/2010  . JOINT REPLACEMENT  01/2010   L TKR - allusio  . LEFT HEART CATHETERIZATION WITH CORONARY ANGIOGRAM N/A 05/22/2012   Procedure: LEFT HEART CATHETERIZATION WITH CORONARY ANGIOGRAM;  Surgeon: Sinclair Grooms, MD;  Location: [facility] CATH LAB;  Service: Cardiovascular;  Laterality: N/A;  . LEFT HEART CATHETERIZATION WITH CORONARY ANGIOGRAM N/A 02/20/2014   Procedure: LEFT HEART CATHETERIZATION WITH CORONARY ANGIOGRAM;  Surgeon: Peter M Martinique, MD;  Location: [facility] CATH LAB;  Service: Cardiovascular;  Laterality: N/A;  . TUBAL LIGATION     Current Outpatient Medications on File Prior to Visit  Medication Sig Dispense Refill  . acetaminophen (TYLENOL) 500 MG tablet Take 500 mg by mouth every 6 (six) hours as needed for moderate pain.    . Ascorbic Acid (VITAMIN C) 500 MG tablet Take 3,000 mg by mouth daily.     Marland Kitchen aspirin 81 MG tablet Take 81 mg by mouth daily.      . carvedilol (COREG CR) 40 MG 24 hr capsule TAKE 1 CAPSULE EVERY EVENING 90 capsule 1  . clindamycin (CLINDAGEL) 1 % gel Apply 1 application topically daily.    . clopidogrel (PLAVIX) 75 MG tablet TAKE  1 TABLET DAILY 90 tablet 3  . fluticasone (FLONASE) 50 MCG/ACT nasal spray Place 2 sprays into both nostrils daily. 48 g 3  . hydrochlorothiazide (MICROZIDE) 12.5 MG capsule TAKE 1 CAPSULE DAILY (Patient taking differently: TAKE 1 CAPSULE QOD) 90 capsule 3  . isosorbide mononitrate (IMDUR) 60 MG 24 hr tablet TAKE 1 TABLET DAILY 90 tablet 3  . Methylsulfonylmethane 1000 MG CAPS Take 1,000 mg by mouth 2 (two) times daily. MSM for joint health    . Multiple Vitamins-Minerals (CENTRUM SILVER ADULT 50+ PO) Take 1 tablet by mouth daily.    . nitroGLYCERIN (NITROSTAT) 0.4 MG SL tablet DISSOLVE 1 TABLET UNDER THE TONGUE EVERY 5 MINUTES AS NEEDED FOR CHEST PAIN 25 tablet 1  . rosuvastatin (CRESTOR) 20 MG tablet Take 1 tablet (20 mg total) by mouth at bedtime. 90 tablet 3  . SYNTHROID 50 MCG tablet DAW; take 1.5 tabs(26mcg) on Monday, Wednesday and Friday and 1 tab on Tuesday, Thursday, Saturday and Sunday 102 tablet 3   No current facility-administered medications on file prior to visit.    Allergies  Allergen Reactions  . 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   ALL NARCOTICS- NAUSEA AND VOMITING   . Adhesive [Tape] Itching, Swelling, Rash and Other (See Comments)    Also pain at location  . Codeine Nausea And Vomiting  . Bactrim [Sulfamethoxazole-Trimethoprim]     Muscle aches  . Cephalexin Hives  . Lisinopril Swelling  . Macrobid [Nitrofurantoin Monohyd Macro] Nausea And Vomiting  . Tamiflu [Oseltamivir Phosphate]    Social History   Socioeconomic History  . Marital status: Married    Spouse name: Not on file  . Number of children: Not on file  . Years of education: Not on file  . Highest education level: Not on file  Social Needs  . Financial resource strain: Not on file  . Food insecurity - worry: Not on file  . Food insecurity - inability: Not on file  . Transportation needs - medical: Not  on file  . Transportation needs - non-medical: Not on file  Occupational History  . Not on file  Tobacco Use  . Smoking status: Never Smoker  . Smokeless tobacco: Never Used  Substance and Sexual Activity  . Alcohol use: No    Alcohol/week: 0.0 oz  . Drug use: No  . Sexual activity: Not Currently  Other Topics Concern  . Not on file  Social History Narrative   Married, 2 children. Retired from [employer] other systems reviewed and are negative.  Objective:   Physical Exam  Constitutional: She appears well-developed and well-nourished.  HENT:  Mouth/Throat: Oropharynx is clear and moist.  Neck: Neck supple. No tracheal deviation present. No thyromegaly present.  Cardiovascular: Normal rate, regular rhythm, normal heart sounds and intact distal pulses. Exam reveals no gallop and no friction rub.  No murmur heard. Pulmonary/Chest: Effort normal and breath sounds normal. No stridor. No respiratory distress. She has no wheezes. She has no rales.  Abdominal: Soft. Bowel sounds are normal.  Lymphadenopathy:    She has no cervical adenopathy.  Vitals reviewed.         Assessment & Plan:  Angioedema, initial encounter - Plan: Ambulatory referral to Allergy  I would like to consult an allergist to determine the offending agent that could be causing her recurrent angioedema.  Review of her medicine list reveals no obvious source.  Review of her dietary consumption reveals no obvious source.  I did give the patient a prescription for prednisone to take again should the symptoms recur to prevent yet another ER visit.  She also has a prescription for an EpiPen at home.  She knows to use the EpiPen if the swelling progresses from beyond her lower lip into her tongue or throat.

## 2017-11-11 ENCOUNTER — Ambulatory Visit: Payer: Medicare Other | Admitting: Endocrinology

## 2017-12-09 ENCOUNTER — Other Ambulatory Visit: Payer: Medicare Other

## 2017-12-14 ENCOUNTER — Other Ambulatory Visit: Payer: Medicare Other

## 2017-12-19 ENCOUNTER — Ambulatory Visit: Payer: Medicare Other | Admitting: Endocrinology

## 2017-12-20 ENCOUNTER — Telehealth: Payer: Self-pay | Admitting: Endocrinology

## 2017-12-20 NOTE — Telephone Encounter (Signed)
Patient stated she missed a call from this office

## 2017-12-21 NOTE — Telephone Encounter (Signed)
Spoke with husband and let him know we did not call her yesterday I suggested another office may have called her

## 2017-12-22 ENCOUNTER — Other Ambulatory Visit (INDEPENDENT_AMBULATORY_CARE_PROVIDER_SITE_OTHER): Payer: Medicare Other

## 2017-12-22 DIAGNOSIS — E042 Nontoxic multinodular goiter: Secondary | ICD-10-CM | POA: Diagnosis not present

## 2017-12-22 DIAGNOSIS — R7303 Prediabetes: Secondary | ICD-10-CM | POA: Diagnosis not present

## 2017-12-22 LAB — GLUCOSE, RANDOM: Glucose, Bld: 113 mg/dL — ABNORMAL HIGH (ref 70–99)

## 2017-12-22 LAB — T4, FREE: FREE T4: 1.01 ng/dL (ref 0.60–1.60)

## 2017-12-22 LAB — TSH: TSH: 2.02 u[IU]/mL (ref 0.35–4.50)

## 2017-12-22 LAB — HEMOGLOBIN A1C: Hgb A1c MFr Bld: 5.8 % (ref 4.6–6.5)

## 2017-12-26 ENCOUNTER — Ambulatory Visit (INDEPENDENT_AMBULATORY_CARE_PROVIDER_SITE_OTHER): Payer: Medicare Other | Admitting: Allergy

## 2017-12-26 ENCOUNTER — Encounter: Payer: Self-pay | Admitting: Allergy

## 2017-12-26 VITALS — BP 122/70 | HR 76 | Ht 67.0 in | Wt 170.0 lb

## 2017-12-26 DIAGNOSIS — T783XXD Angioneurotic edema, subsequent encounter: Secondary | ICD-10-CM | POA: Diagnosis not present

## 2017-12-26 DIAGNOSIS — J310 Chronic rhinitis: Secondary | ICD-10-CM | POA: Diagnosis not present

## 2017-12-26 NOTE — Progress Notes (Signed)
New Patient Note  RE: Lindsey Erickson MRN: [id number] DOB: Oct 08, 1932 Date of Office Visit: 12/26/2017  Referring provider: Susy Frizzle, MD Primary care provider: Susy Frizzle, MD  Chief Complaint: lip swelling  History of present illness: Lindsey Erickson is a 84 y.o. female presenting today for consultation for lip swelling.   She has had 3 episodes of lip swelling.  First episode was 09/18/17 while she was in church.  She states she felt something funny on her lower lip and when she went to look it was swollen.  She states this episode was the worst of the 3.   She states she initially treated herself with benadryl 50mg  and pepcid upon recommendation of a family member who is a physician.  Symptoms didn't improve much so she went to ED. in the ED she was noted on exam to have left lower lip swelling without tongue or posterior pharynx involvement.  She was treated in the ED with a normal saline bolus as well as IV Solu-Medrol and Pepcid.  She was on lisinopril which they recommended she stopped.  She had been on lisinopril for about 14 years.  She was discharged with prednisone x12 days as well as a prescription for Atarax.  The 2nd episode of lip swelling occurred on 12/15.  She states this time the right lower lip was swollen and again took Benadryl.  ED she was noted on exam to have a slightly edematous lip on the right side and base of tongue was also mildly edematous.  She was treated with Decadron.  She was also prescribed an EpiPen as well as a prednisone pack.    The 3rd episode of lip swelling occurred on 12/12/16.  This is the mildest episode.  She states by this point she had seen her PCP who had given her prednisone to use at home if she had another swelling episode.  She states she did take the prednisone when she noted her lip was swollen.  She also use Benadryl.  This improved her symptoms and she did not need to present to the emergency department.    Each of  these episodes she denies any tongue swelling, difficulty swallowing or breathing, GI, respiratory or CV related symptoms.  She denies any itchiness or rash with the swelling.  She has not had any swelling of any other body part outside the oral cavity.  She denies any new body products including lipsticks, no new foods and no stings.  She does states that her generic medications of hydrochlorothiazide and Coreg changed recently before the onset of swelling started where she noted that the color of the capsule was a bit different than before.  She continues to take these 2 medications on a daily basis and has not had any issues on a daily basis.  She denies any concerning foods and does not recall if there are any consistent foods that she ate on each of these episodes.  She does state that she eats red meat rather infrequently but she does enjoy a hamburger pork that she will cook herself.  She is not sure if she had red meat products on the day of the reactions.  She does report that her father had an episode of tongue swelling however this was associated to having a nut allergy.    She does report having seasonal allergy symptoms including nasal congestion/drainage with ear fullness.  Symptoms worse in winter.  She sleeps with a dehumidier.  Flonase 1 spray each nostril daily.  Review of systems: Review of Systems  Constitutional: Negative for chills, fever and malaise/fatigue.  HENT: Positive for congestion and nosebleeds. Negative for ear discharge, ear pain, sinus pain, sore throat and tinnitus.   Eyes: Negative for pain, discharge and redness.  Respiratory: Negative for cough, shortness of breath and wheezing.   Cardiovascular: Negative for chest pain.  Gastrointestinal: Negative for abdominal pain, constipation, diarrhea, heartburn, nausea and vomiting.  Musculoskeletal: Negative for joint pain.  Skin: Negative for itching and rash.  Neurological: Negative for headaches.    All other  systems negative unless noted above in HPI  Past medical history: Past Medical History:  Diagnosis Date  . Anemia   . Arthritis   . Blood transfusion without reported diagnosis 1957  . CAD (coronary artery disease)    a. NSTEMI 2004 with thrombus in Cx and mod RCA. b. Nonischemic nuc 2008. c. mild NSTEMI 01/2013 (trop 0.42) -> cath with unchanged mod LAD & RCA disease, for continued medical therapy.  . Cancer ([place])   . Cataract   . H/O Hashimoto thyroiditis   . Hyperlipidemia   . HYPERTENSION    a. Also has h/o intermittentl low BP.  Marland Kitchen Hypothyroidism   . Kidney stones   . Myocardial infarction ([place])   . Osteoporosis    no fractures  . Prediabetes   . QT prolongation    a. Prior EKGs QTc 474, but on 4/1 was 524 for unclear reason.  . Recurrent urinary tract infection   . Squamous cell carcinoma of skin 04/17/2015    Past surgical history: Past Surgical History:  Procedure Laterality Date  . APPENDECTOMY  1999  . Bilateral Arthroscopies of knees    . CARDIAC CATHETERIZATION     Moderate 3 vessel CAD by cath 05/2012  . CHOLECYSTECTOMY  02/2010  . JOINT REPLACEMENT  01/2010   L TKR - allusio  . LEFT HEART CATHETERIZATION WITH CORONARY ANGIOGRAM N/A 05/22/2012   Procedure: LEFT HEART CATHETERIZATION WITH CORONARY ANGIOGRAM;  Surgeon: Sinclair Grooms, MD;  Location: [facility] CATH LAB;  Service: Cardiovascular;  Laterality: N/A;  . LEFT HEART CATHETERIZATION WITH CORONARY ANGIOGRAM N/A 02/20/2014   Procedure: LEFT HEART CATHETERIZATION WITH CORONARY ANGIOGRAM;  Surgeon: Peter M Martinique, MD;  Location[facility] C[facility] LAB;  Service: Cardiovascular;  Laterality: N/A;  . TUBAL LIGATION      Family history:  Family History  Problem Relation Age of Onset  . Allergies Father   . Cancer Father   . Coronary artery disease Mother 71  . Heart disease Mother   . CAD Sister 42  . Cancer Sister   . AAA (abdominal aortic aneurysm) Sister   . Hypertension Sister   . Hyperlipidemia Sister   . Diabetes  Sister   . Heart disease Sister   . Diabetes Maternal Aunt     Social history: She lives in a home with carpeting in the bedroom with electric heating and central cooling.  There is a cat in the home.  There is no concern for water damage, mildew or roaches in the home.  She is a retired Music therapist.  She denies a smoking history.   Medication List: Allergies as of 12/26/2017      Reactions   Other Other (See Comments)   Frozen Blood Plasma-caused patient to crash / cardiac arrest / CAN NEVER TAKE THIS AGAIN-PER MD'S.  ALL NARCOTICS- NAUSEA AND VOMITING    Adhesive [tape] Itching, Swelling, Rash, Other (  See Comments)   Also pain at location   Codeine Nausea And Vomiting   Bactrim [sulfamethoxazole-trimethoprim]    Muscle aches   Cephalexin Hives   Lisinopril Swelling   Macrobid [nitrofurantoin Monohyd Macro] Nausea And Vomiting   Tamiflu [oseltamivir Phosphate]       Medication List        Accurate as of 12/26/17  1:27 PM. Always use your most recent med list.          acetaminophen 500 MG tablet Commonly known as:  TYLENOL Take 500 mg by mouth every 6 (six) hours as needed for moderate pain.   aspirin 81 MG tablet Take 81 mg by mouth daily.   carvedilol 40 MG 24 hr capsule Commonly known as:  COREG CR TAKE 1 CAPSULE EVERY EVENING   CENTRUM SILVER ADULT 50+ PO Take 1 tablet by mouth daily.   clindamycin 1 % gel Commonly known as:  CLINDAGEL Apply 1 application topically daily.   clopidogrel 75 MG tablet Commonly known as:  PLAVIX TAKE 1 TABLET DAILY   fluticasone 50 MCG/ACT nasal spray Commonly known as:  FLONASE Place 2 sprays into both nostrils daily.   hydrochlorothiazide 12.5 MG capsule Commonly known as:  MICROZIDE TAKE 1 CAPSULE DAILY   isosorbide mononitrate 60 MG 24 hr tablet Commonly known as:  IMDUR TAKE 1 TABLET DAILY   Methylsulfonylmethane 1000 MG Caps Take 1,000 mg by mouth 2 (two) times daily. MSM for joint health     nitroGLYCERIN 0.4 MG SL tablet Commonly known as:  NITROSTAT DISSOLVE 1 TABLET UNDER THE TONGUE EVERY 5 MINUTES AS NEEDED FOR CHEST PAIN   predniSONE 20 MG tablet Commonly known as:  DELTASONE 3 tabs poqday 1-2, 2 tabs poqday 3-4, 1 tab poqday 5-6   rosuvastatin 20 MG tablet Commonly known as:  CRESTOR Take 1 tablet (20 mg total) by mouth at bedtime.   SYNTHROID 50 MCG tablet Generic drug:  levothyroxine DAW; take 1.5 tabs(77mcg) on Monday, Wednesday and Friday and 1 tab on Tuesday, Thursday, Saturday and Sunday       Known medication allergies: Allergies  Allergen Reactions  . 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   ALL NARCOTICS- NAUSEA AND VOMITING   . Adhesive [Tape] Itching, Swelling, Rash and Other (See Comments)    Also pain at location  . Codeine Nausea And Vomiting  . Bactrim [Sulfamethoxazole-Trimethoprim]     Muscle aches  . Cephalexin Hives  . Lisinopril Swelling  . Macrobid [Nitrofurantoin Monohyd Macro] Nausea And Vomiting  . Tamiflu [Oseltamivir Phosphate]      Physical examination: Blood pressure 122/70, pulse 76, height 5\' 7"  (1.702 m), weight 170 lb (77.1 kg), SpO2 96 %.  General: Alert, interactive, in no acute distress. HEENT: PERRLA, TMs pearly gray, turbinates minimally edematous without discharge, post-pharynx non erythematous.   Neck: Supple without lymphadenopathy. Lungs: Clear to auscultation without wheezing, rhonchi or rales. {no increased work of breathing. CV: Normal S1, S2 without murmurs. Abdomen: Nondistended, nontender. Skin: Warm and dry, without lesions or rashes. Extremities:  No clubbing, cyanosis or edema. Neuro:   Grossly intact.  Diagnositics/Labs: None today  Assessment and plan:   Angioedema (lip swelling)     - episodic angioedema can be due to variety of different triggers however falls under 2 categories histaminergic vs  bradykinin mediated.  ACE-I (lisinopril) is a known medication that can cause angioedema of the  oral cavity.   The swelling can continue for months even despite stopping the medication.  This category of medications should not be taken again in the future.   Lisinopril is still the most likely culprit to your angioedema.  She has noted that each subsequent episode has been less severe and expect the episodes to stop since she has discontinued Lisinopril.  Given lack of prurtitus or urticaria will rule-out HAE which is bradykinin mediated and acquired angioedema.  She also eats mammalian meat on occasion and will rule-out alpha gal allergy.  She does reports seasonal allergy symptoms and will obtain environmental panel as well to determine if there may be an environmental trigger.       - we will obtain labs including hereditary angioedema panel, alpha gal panel (mammalian meat allergy), tryptase and environmental panel     - while we await lab results recommend taking a antihistamine regimen to help reduce/prevent swelling episodes: take Allegra 180mg  daily and an H2 blocker like Zantac 150mg   or Pepcid 20mg  twice a day.       - may use prednisone for acute onset of lip swelling if occurs again in future as well as as needed benadryl.    Nasal congestion and ear fullness   - as above will obtain environmental allergy panel   - Flonase 1-2 sprays each nostril for nasal congestion.  May use 3 days a week (M/W/F) if issues with nosebleeds.   Also recommend use of nasal saline spray prior to use of Flonase.  May use nasal saline multiple times a day if needed for moisturization.    Follow-up 3 months or sooner if needed     I appreciate the opportunity to take part in [place] care. Please do not hesitate to contact me with questions.  Sincerely,   Prudy Feeler, MD Allergy/Immunology [facility]

## 2017-12-26 NOTE — Patient Instructions (Addendum)
Angioedema (lip swelling)     - episodic angioedema can be due to variety of different triggers.  ACE-I (lisinopril) is a known medication that can cause angioedema of the oral cavity.   The swelling can continue for months even despite stopping the medication.  This category of medications should not be taken again in the future.   Lisinopril is still the most likely culprit to your angioedema.  Angioedema can also be triggered by histamine (ie. Allergic reactions) or bradykinins (Hereditary or acquired angioedema).        - we will obtain labs including hereditary angioedema panel, alpha gal panel (mammalian meat allergy), tryptase and environmental panel     - while we await lab results recommend taking a antihistamine regimen to help reduce/prevent swelling episodes: take Allegra 180mg  daily and an H2 blocker like Zantac 150mg   or Pepcid 20mg  twice a day.       - may use prednisone for acute onset of lip swelling if occurs again in future  Nasal congestion and ear fullness   - as above will obtain environmental allergy panel   - Flonase 1-2 sprays each nostril for nasal congestion.  May use 3 days a week (M/W/F) if issues with nosebleeds.   Also recommend use of nasal saline spray prior to use of Flonase.  May use nasal saline multiple times a day if needed for moisturization.    Follow-up 3 months or sooner if needed

## 2017-12-27 ENCOUNTER — Ambulatory Visit (INDEPENDENT_AMBULATORY_CARE_PROVIDER_SITE_OTHER): Payer: Medicare Other | Admitting: Endocrinology

## 2017-12-27 ENCOUNTER — Encounter: Payer: Self-pay | Admitting: Endocrinology

## 2017-12-27 VITALS — BP 158/82 | HR 82 | Ht 67.0 in | Wt 170.0 lb

## 2017-12-27 DIAGNOSIS — R7303 Prediabetes: Secondary | ICD-10-CM | POA: Diagnosis not present

## 2017-12-27 DIAGNOSIS — E042 Nontoxic multinodular goiter: Secondary | ICD-10-CM

## 2017-12-27 DIAGNOSIS — I10 Essential (primary) hypertension: Secondary | ICD-10-CM | POA: Diagnosis not present

## 2017-12-27 NOTE — Progress Notes (Signed)
Patient ID: Lindsey Erickson, female   DOB: 1932-09-17, 84 y.o.   MRN: [id number]   Reason for Appointment:  Endocrinology followup     History of Present Illness:   She initially had a multinodular goiter in 1982 She has had 3 biopsies of her thyroid nodules subsequently with the last one in 2003 which were benign Her biopsies had shown a few follicular cells and lymphocytes, not clear which nodule was biopsied  ? Hypothyroidism:  Most likely she has been on thyroid suppression for the goiter rather than true hypothyroidism although previous records do indicate Hashimoto thyroiditis  The treatments that the patient  had taken include Synthroid 88 mcg for several years  This was reduced to 50 g in 11/16 because of low normal TSH of 0.36.           Because of her symptoms of feeling a little tired and somewhat cold even with normal TSH in the past she is taking an additional half tablet 3 times a week Subsequent TSH levels have been normal including in 12/2017  Does not complain of any unusual fatigue or cold intolerance  She is very regular with taking the regimen as above levothyroxine before breakfast .   Lab Results  Component Value Date   TSH 2.02 12/22/2017   TSH 1.09 05/06/2017   TSH 1.42 11/03/2016   FREET4 1.01 12/22/2017   FREET4 0.98 05/06/2017   FREET4 1.02 11/03/2016    GOITER: She has had a long-standing enlargement, mostly in the midline Does not complain of any local pressure or discomfort, does have a little pressure when she is lying flat but no choking No difficulty swallowing again  Her thyroid enlargement has been unchanged in the last few years   PREDIABETES:   Her last fasting glucose previously has been as high as 129 2017 December Has  had a glucose tolerance test which did not show any increase after glucose load  A1c has been consistently in the upper normal range, has been as low as 5.4 It is now 5.8  However she has had periodic  doses of prednisone since October fasting glucose is relatively higher now  She generally trying to watch her diet with portions, sweets and carbohydrates her weight is almost the same   More recently fasting glucose is 113, has been as low as 103    Wt Readings from Last 3 Encounters:  12/27/17 170 lb (77.1 kg)  12/26/17 170 lb (77.1 kg)  11/08/17 173 lb (78.5 kg)    Lab Results  Component Value Date   HGBA1C 5.8 12/22/2017   HGBA1C 5.4 05/09/2017   HGBA1C 6.0 11/03/2016   Lab Results  Component Value Date   LDLCALC 78 04/11/2017   CREATININE 0.75 04/11/2017     Allergies as of 12/27/2017      Reactions   Other Other (See Comments)   Frozen Blood Plasma-caused patient to crash / cardiac arrest / CAN NEVER TAKE THIS AGAIN-PER MD'S.  ALL NARCOTICS- NAUSEA AND VOMITING    Adhesive [tape] Itching, Swelling, Rash, Other (See Comments)   Also pain at location   Codeine Nausea And Vomiting   Bactrim [sulfamethoxazole-trimethoprim]    Muscle aches   Cephalexin Hives   Lisinopril Swelling   Macrobid [nitrofurantoin Monohyd Macro] Nausea And Vomiting   Tamiflu [oseltamivir Phosphate]       Medication List        Accurate as of 12/27/17  8:19 AM. Always use your most  recent med list.          acetaminophen 500 MG tablet Commonly known as:  TYLENOL Take 500 mg by mouth every 6 (six) hours as needed for moderate pain.   aspirin 81 MG tablet Take 81 mg by mouth daily.   carvedilol 40 MG 24 hr capsule Commonly known as:  COREG CR TAKE 1 CAPSULE EVERY EVENING   CENTRUM SILVER ADULT 50+ PO Take 1 tablet by mouth daily.   clindamycin 1 % gel Commonly known as:  CLINDAGEL Apply 1 application topically daily.   clopidogrel 75 MG tablet Commonly known as:  PLAVIX TAKE 1 TABLET DAILY   fluticasone 50 MCG/ACT nasal spray Commonly known as:  FLONASE Place 2 sprays into both nostrils daily.   hydrochlorothiazide 12.5 MG capsule Commonly known as:  MICROZIDE TAKE 1  CAPSULE DAILY   isosorbide mononitrate 60 MG 24 hr tablet Commonly known as:  IMDUR TAKE 1 TABLET DAILY   Methylsulfonylmethane 1000 MG Caps Take 1,000 mg by mouth 2 (two) times daily. MSM for joint health   nitroGLYCERIN 0.4 MG SL tablet Commonly known as:  NITROSTAT DISSOLVE 1 TABLET UNDER THE TONGUE EVERY 5 MINUTES AS NEEDED FOR CHEST PAIN   predniSONE 20 MG tablet Commonly known as:  DELTASONE 3 tabs poqday 1-2, 2 tabs poqday 3-4, 1 tab poqday 5-6   rosuvastatin 20 MG tablet Commonly known as:  CRESTOR Take 1 tablet (20 mg total) by mouth at bedtime.   SYNTHROID 50 MCG tablet Generic drug:  levothyroxine DAW; take 1.5 tabs(34mcg) on Monday, Wednesday and Friday and 1 tab on Tuesday, Thursday, Saturday and Sunday       Allergies:  Allergies  Allergen Reactions  . 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   ALL NARCOTICS- NAUSEA AND VOMITING   . Adhesive [Tape] Itching, Swelling, Rash and Other (See Comments)    Also pain at location  . Codeine Nausea And Vomiting  . Bactrim [Sulfamethoxazole-Trimethoprim]     Muscle aches  . Cephalexin Hives  . Lisinopril Swelling  . Macrobid [Nitrofurantoin Monohyd Macro] Nausea And Vomiting  . Tamiflu [Oseltamivir Phosphate]     Past Medical History:  Diagnosis Date  . Anemia   . Arthritis   . Blood transfusion without reported diagnosis 1957  . CAD (coronary artery disease)    a. NSTEMI 2004 with thrombus in Cx and mod RCA. b. Nonischemic nuc 2008. c. mild NSTEMI 01/2013 (trop 0.42) -> cath with unchanged mod LAD & RCA disease, for continued medical therapy.  . Cancer ([place])   . Cataract   . H/O Hashimoto thyroiditis   . Hyperlipidemia   . HYPERTENSION    a. Also has h/o intermittentl low BP.  Marland Kitchen Hypothyroidism   . Kidney stones   . Myocardial infarction ([place])   . Osteoporosis    no fractures  . Prediabetes   . QT prolongation     a. Prior EKGs QTc 474, but on 4/1 was 524 for unclear reason.  . Recurrent urinary tract infection   . Squamous cell carcinoma of skin 04/17/2015    Past Surgical History:  Procedure Laterality Date  . APPENDECTOMY  1999  . Bilateral Arthroscopies of knees    . CARDIAC CATHETERIZATION     Moderate 3 vessel CAD by cath 05/2012  . CHOLECYSTECTOMY  02/2010  . JOINT REPLACEMENT  01/2010   L TKR - allusio  .  LEFT HEART CATHETERIZATION WITH CORONARY ANGIOGRAM N/A 05/22/2012   Procedure: LEFT HEART CATHETERIZATION WITH CORONARY ANGIOGRAM;  Surgeon: Sinclair Grooms, MD;  Location: [facility] CATH LAB;  Service: Cardiovascular;  Laterality: N/A;  . LEFT HEART CATHETERIZATION WITH CORONARY ANGIOGRAM N/A 02/20/2014   Procedure: LEFT HEART CATHETERIZATION WITH CORONARY ANGIOGRAM;  Surgeon: Peter M Martinique, MD;  Location[facility] C[facility] LAB;  Service: Cardiovascular;  Laterality: N/A;  . TUBAL LIGATION      Family History  Problem Relation Age of Onset  . Allergies Father   . Cancer Father   . Coronary artery disease Mother 93  . Heart disease Mother   . CAD Sister 81  . Cancer Sister   . AAA (abdominal aortic aneurysm) Sister   . Hypertension Sister   . Hyperlipidemia Sister   . Diabetes Sister   . Heart disease Sister   . Diabetes Maternal Aunt     Social History:  reports that  has never smoked. she has never used smokeless tobacco. She reports that she does not drink alcohol or use drugs.  REVIEW Of SYSTEMS:  History of CAD, followed regularly by cardiologist  Hypertension: Is on a 2 drug regimen, Followed by PCP and cardiologist This appears to be inconsistent in control  BP Readings from Last 3 Encounters:  12/27/17 (!) 158/82  12/26/17 122/70  11/08/17 (!) 162/88      Examination:   BP (!) 158/82 (BP Location: Left Arm, Patient Position: Sitting, Cuff Size: Large)   Pulse 82   Ht 5\' 7"  (1.702 m)   Wt 170 lb (77.1 kg) Comment: pt refused to weigh  SpO2 98%   BMI 26.63 kg/m      Thyroid is enlarged but mostly in the midline extending bilaterally It is firm without any nodule formation  Lower neck circumference is 41.5, previously 41  No lymphadenopathy in the neck    Assessments/Plan   Impaired fasting glucose:   Her blood sugars are relatively higher but still excellent with A1c only 5.8 and she is doing well with diet alone Some of her high sugars may be related to getting prednisone recently She is reminded about continuing to make efforts to lose weight  She will come back in about 6 months for follow-up  Multinodular goiter, long-standing, clinically stable with no significant local pressure symptoms Examination shows a goiter to be about the same in size  Mild hypothyroidism: TSH is normal  She is taking brand name Synthroid 50 g, 8-1/2 tablets a week Symptomatically doing well  She will continue the same regimen and follow-up this month  Encouraged her to get a general exam with her PCP and also follow-up for hypertension    Elayne Snare 12/27/2017, 8:19 AM

## 2018-01-04 LAB — IGE+ALLERGENS ZONE 2(30)
Bermuda Grass IgE: <0.1 kU/L
Cedar, Mountain IgE: <0.1 kU/L
Cockroach, American IgE: <0.1 kU/L
D Pteronyssinus IgE: <0.1 kU/L
Hickory, White IgE: <0.1 kU/L
IgE (Immunoglobulin E), Serum: 47 IU/mL (ref 0–100)
Johnson Grass IgE: <0.1 kU/L
Maple/Box Elder IgE: <0.1 kU/L
Nettle IgE: <0.1 kU/L
Oak, White IgE: <0.1 kU/L
Penicillium Chrysogen IgE: <0.1 kU/L
Plantain, English IgE: <0.1 kU/L
Sheep Sorrel IgE Qn: <0.1 kU/L
T008-IGE ELM, AMERICAN: 0.16 kU/L — AB

## 2018-01-04 LAB — TRYPTASE: Tryptase: 4.6 ug/L (ref 2.2–13.2)

## 2018-01-04 LAB — ALPHA-GAL PANEL
Alpha Gal IgE*: <0.1 kU/L (ref <0.10)
BEEF CLASS INTERPRETATION: 0
Class Interpretation: 0
LAMB CLASS INTERPRETATION: 0

## 2018-01-04 LAB — COMPLEMENT COMPONENT C1Q: Complement C1Q: 16.5 mg/dL (ref 11.8–24.4)

## 2018-01-04 LAB — C1 ESTERASE INHIBITOR, FUNC: C1 Est.Inhib.Funct.: 101 %mean normal

## 2018-01-04 LAB — HEREDITARY ANGIOEDEMA
C2 Esterase Inhibitor, Serum: 35 mg/dL (ref 21–39)
COMPLEMENT C4, SERUM: 42 mg/dL (ref 14–44)

## 2018-01-04 LAB — HAE INTERPRETATION:

## 2018-01-18 ENCOUNTER — Other Ambulatory Visit: Payer: Self-pay | Admitting: Endocrinology

## 2018-01-27 ENCOUNTER — Telehealth: Payer: Self-pay | Admitting: Allergy

## 2018-01-27 NOTE — Telephone Encounter (Signed)
**  URGENT** Patient is having swelling in her lip - started about 30 mins ago Patient has already taken her allegra and her pepcid at 9:00 this morning What can she take in addition to this?? Please call patient to answer any questions

## 2018-01-27 NOTE — Telephone Encounter (Signed)
Consulted with Dr. Nelva Bush. Patient instructed to take one additional dose of Allegre and one 20mg  tablet of Prednisone now. Will contact us if problem does not resolve.

## 2018-03-01 ENCOUNTER — Ambulatory Visit (INDEPENDENT_AMBULATORY_CARE_PROVIDER_SITE_OTHER): Payer: Medicare Other | Admitting: Cardiovascular Disease

## 2018-03-01 ENCOUNTER — Encounter: Payer: Self-pay | Admitting: Cardiovascular Disease

## 2018-03-01 VITALS — BP 152/72 | HR 70 | Ht 68.0 in | Wt 171.0 lb

## 2018-03-01 DIAGNOSIS — E7800 Pure hypercholesterolemia, unspecified: Secondary | ICD-10-CM | POA: Diagnosis not present

## 2018-03-01 DIAGNOSIS — Z0000 Encounter for general adult medical examination without abnormal findings: Secondary | ICD-10-CM

## 2018-03-01 DIAGNOSIS — I10 Essential (primary) hypertension: Secondary | ICD-10-CM | POA: Diagnosis not present

## 2018-03-01 NOTE — Assessment & Plan Note (Signed)
History of CAD status post non-STEMI back in 2004 with cath that showed moderate disease in the RCA and thrombus in the circumflex. She will coronary catheterization by Dr. Irish Lack 02/20/14 after he came in with chest pain is notable for 70% mid RCA lesion, 56% mid circumflex lesion with FFR interrogation suggesting nonsignificance. Medical therapy was recommended. She denies chest pain.

## 2018-03-01 NOTE — Progress Notes (Signed)
03/01/2018 Lindsey Erickson   [birth year]  [id number]  Primary Physician Pickard, Cammie Mcgee, MD Primary Cardiologist: Lorretta Harp MD Lindsey Erickson, [place]  HPI:  Lindsey Erickson is a 84 y.o.  mildly overweight married Caucasian female mother of 2 children , grandmother of 4 grandchildren who is self-referred to be established in my practice. She was previously a cardiology patient of Dr. Pernell Dupre. Her new primary care physician is Dr. Margaretmary Eddy at [facility]. I last saw her in the office 03/09/17. Cardiovascular risk factor profile is notable for treated hypertension and hyperlipidemia. She does have a family history his sister who's had stents. Her cardiac history dates back to 2004 when she had a non-STEMI related to thrombus in the circumflex with moderate RCA disease. She had a negative nuclear stress test in 2008 and underwent cardiac catheterization one year ago on 02/20/14 by Dr. Irish Lack after being admitted with chest pain and evolving positive enzymes. She gets infrequent nitroglycerin responsive chest pain. Her anatomy at the time of her last cath was notable for a 70% mid RCA lesion, 50-60% mid-circumflex lesion. The RCA underwent FFR interrogation which measured 0.9 suggesting that it was not physiologically significant. Since I saw her a year ago she's remained clinically stable without chest pain or shortness of breath. She was seen in the emergency room angioedema related to lisinopril which was discontinued. Blood pressure at home have remained under good control.     Current Meds  Medication Sig  . acetaminophen (TYLENOL) 500 MG tablet Take 500 mg by mouth every 6 (six) hours as needed for moderate pain.  Marland Kitchen aspirin 81 MG tablet Take 81 mg by mouth daily.    . carvedilol (COREG CR) 40 MG 24 hr capsule TAKE 1 CAPSULE EVERY EVENING  . clindamycin (CLINDAGEL) 1 % gel Apply 1 application topically daily.  . clopidogrel (PLAVIX) 75 MG tablet TAKE 1 TABLET  DAILY  . fluticasone (FLONASE) 50 MCG/ACT nasal spray Place 2 sprays into both nostrils daily.  . hydrochlorothiazide (MICROZIDE) 12.5 MG capsule TAKE 1 CAPSULE DAILY (Patient taking differently: TAKE 1 CAPSULE QOD)  . isosorbide mononitrate (IMDUR) 60 MG 24 hr tablet TAKE 1 TABLET DAILY  . Multiple Vitamins-Minerals (CENTRUM SILVER ADULT 50+ PO) Take 1 tablet by mouth daily.  . nitroGLYCERIN (NITROSTAT) 0.4 MG SL tablet DISSOLVE 1 TABLET UNDER THE TONGUE EVERY 5 MINUTES AS NEEDED FOR CHEST PAIN  . predniSONE (DELTASONE) 20 MG tablet 3 tabs poqday 1-2, 2 tabs poqday 3-4, 1 tab poqday 5-6  . rosuvastatin (CRESTOR) 20 MG tablet Take 1 tablet (20 mg total) by mouth at bedtime.  Marland Kitchen SYNTHROID 50 MCG tablet TAKE 1 TABLET ALTERNATING WITH ONE AND ONE-HALF TABLETS AS DIRECTED  . [DISCONTINUED] Methylsulfonylmethane 1000 MG CAPS Take 1,000 mg by mouth 2 (two) times daily. MSM for joint health     Allergies  Allergen Reactions  . 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   ALL NARCOTICS- NAUSEA AND VOMITING   . Adhesive [Tape] Itching, Swelling, Rash and Other (See Comments)    Also pain at location  . Codeine Nausea And Vomiting  . Bactrim [Sulfamethoxazole-Trimethoprim]     Muscle aches  . Cephalexin Hives  . Lisinopril Swelling  . Macrobid [Nitrofurantoin Monohyd Macro] Nausea And Vomiting  . Tamiflu [Oseltamivir Phosphate]     Social History   Socioeconomic History  .  Marital status: Married    Spouse name: Not on file  . Number of children: Not on file  . Years of education: Not on file  . Highest education level: Not on file  Occupational History  . Not on file  Social Needs  . Financial resource strain: Not on file  . Food insecurity:    Worry: Not on file    Inability: Not on file  . Transportation needs:    Medical: Not on file    Non-medical: Not on file  Tobacco Use  . Smoking  status: Never Smoker  . Smokeless tobacco: Never Used  Substance and Sexual Activity  . Alcohol use: No    Alcohol/week: 0.0 oz  . Drug use: No  . Sexual activity: Not Currently  Lifestyle  . Physical activity:    Days per week: Not on file    Minutes per session: Not on file  . Stress: Not on file  Relationships  . Social connections:    Talks on phone: Not on file    Gets together: Not on file    Attends religious service: Not on file    Active member of club or organization: Not on file    Attends meetings of clubs or organizations: Not on file    Relationship status: Not on file  . Intimate partner violence:    Fear of current or ex partner: Not on file    Emotionally abused: Not on file    Physically abused: Not on file    Forced sexual activity: Not on file  Other Topics Concern  . Not on file  Social History Narrative   Married, 2 children. Retired from [employer]: General: negative for chills, fever, night sweats or weight changes.  Cardiovascular: negative for chest pain, dyspnea on exertion, edema, orthopnea, palpitations, paroxysmal nocturnal dyspnea or shortness of breath Dermatological: negative for rash Respiratory: negative for cough or wheezing Urologic: negative for hematuria Abdominal: negative for nausea, vomiting, diarrhea, bright red blood per rectum, melena, or hematemesis Neurologic: negative for visual changes, syncope, or dizziness All other systems reviewed and are otherwise negative except as noted above.    Blood pressure (!) 152/72, pulse 70, height 5\' 8"  (1.727 m), weight 171 lb (77.6 kg).  General appearance: alert and no distress Neck: no adenopathy, no carotid bruit, no JVD, supple, symmetrical, trachea midline and thyroid not enlarged, symmetric, no tenderness/mass/nodules Lungs: clear to auscultation bilaterally Heart: regular rate and rhythm, S1, S2 normal, no murmur, click, rub or gallop Extremities:  extremities normal, atraumatic, no cyanosis or edema Pulses: 2+ and symmetric Skin: Skin color, texture, turgor normal. No rashes or lesions Neurologic: Alert and oriented X 3, normal strength and tone. Normal symmetric reflexes. Normal coordination and gait  EKG sinus rhythm at 70 without ST or T-wave changes. I personally reviewed this EKG.  ASSESSMENT AND PLAN:   Essential hypertension History of essential hypertension blood pressure measures 152/72. She was on lisinopril which was discontinued because of recent angioedema. She is also on carvedilol and hydrochlorothiazide. Continue current meds at current dosing. Her blood pressure was much better when she measures this.  NSTEMI (non-ST elevated myocardial infarction) History of CAD status post non-STEMI back in 2004 with cath that showed moderate disease in the RCA and thrombus in the circumflex. She will coronary catheterization by Dr. Irish Lack 02/20/14 after he came in with chest pain is notable for 70% mid RCA lesion, 56% mid circumflex  lesion with FFR interrogation suggesting nonsignificance. Medical therapy was recommended. She denies chest pain.  Hyperlipidemia History of hyperlipidemia on statin therapy with lipid profile performed 04/11/17 revealed total cholesterol 174, LDL 78 and HDL of 62.      Lorretta Harp MD FACP,FACC,FAHA, [facility] 03/01/2018 12:05 PM

## 2018-03-01 NOTE — Patient Instructions (Signed)

## 2018-03-01 NOTE — Assessment & Plan Note (Signed)
History of hyperlipidemia on statin therapy with lipid profile performed 04/11/17 revealed total cholesterol 174, LDL 78 and HDL of 62.

## 2018-03-01 NOTE — Assessment & Plan Note (Signed)
History of essential hypertension blood pressure measures 152/72. She was on lisinopril which was discontinued because of recent angioedema. She is also on carvedilol and hydrochlorothiazide. Continue current meds at current dosing. Her blood pressure was much better when she measures this.

## 2018-03-16 ENCOUNTER — Encounter: Payer: Self-pay | Admitting: Family Medicine

## 2018-03-16 ENCOUNTER — Ambulatory Visit (INDEPENDENT_AMBULATORY_CARE_PROVIDER_SITE_OTHER): Payer: Medicare Other | Admitting: Family Medicine

## 2018-03-16 VITALS — BP 116/76 | HR 72 | Temp 98.2°F | Resp 18 | Ht 67.0 in | Wt 169.0 lb

## 2018-03-16 DIAGNOSIS — J209 Acute bronchitis, unspecified: Secondary | ICD-10-CM

## 2018-03-16 MED ORDER — DOXYCYCLINE HYCLATE 100 MG PO TABS: 100.0000 mg | ORAL_TABLET | Freq: Two times a day (BID) | ORAL | 0 refills | Status: DC

## 2018-03-16 NOTE — Progress Notes (Signed)
Subjective:    Patient ID: Lindsey Erickson, female    DOB: [birth year], 84 y.o.   MRN: [id number]  HPI Symptoms began 1 week ago with a fever to 102.  She has maintained a fever to 100 every night since.  She reports cough and chest congestion.  She denies any shortness of breath.  Cough is nonproductive.  On exam today, she has rales as well as rhonchi in both lungs.  She also appears to be developing impetigo inside her left nostril Past Medical History:  Diagnosis Date  . Anemia   . Arthritis   . Blood transfusion without reported diagnosis 1957  . CAD (coronary artery disease)    a. NSTEMI 2004 with thrombus in Cx and mod RCA. b. Nonischemic nuc 2008. c. mild NSTEMI 01/2013 (trop 0.42) -> cath with unchanged mod LAD & RCA disease, for continued medical therapy.  . Cancer ([place])   . Cataract   . H/O Hashimoto thyroiditis   . Hyperlipidemia   . HYPERTENSION    a. Also has h/o intermittentl low BP.  Marland Kitchen Hypothyroidism   . Kidney stones   . Myocardial infarction ([place])   . Osteoporosis    no fractures  . Prediabetes   . QT prolongation    a. Prior EKGs QTc 474, but on 4/1 was 524 for unclear reason.  . Recurrent urinary tract infection   . Squamous cell carcinoma of skin 04/17/2015   Past Surgical History:  Procedure Laterality Date  . APPENDECTOMY  1999  . Bilateral Arthroscopies of knees    . CARDIAC CATHETERIZATION     Moderate 3 vessel CAD by cath 05/2012  . CHOLECYSTECTOMY  02/2010  . JOINT REPLACEMENT  01/2010   L TKR - allusio  . LEFT HEART CATHETERIZATION WITH CORONARY ANGIOGRAM N/A 05/22/2012   Procedure: LEFT HEART CATHETERIZATION WITH CORONARY ANGIOGRAM;  Surgeon: Sinclair Grooms, MD;  Location: [facility] CATH LAB;  Service: Cardiovascular;  Laterality: N/A;  . LEFT HEART CATHETERIZATION WITH CORONARY ANGIOGRAM N/A 02/20/2014   Procedure: LEFT HEART CATHETERIZATION WITH CORONARY ANGIOGRAM;  Surgeon: Peter M Martinique, MD;  Location[facility] C[facility] LAB;  Service: Cardiovascular;   Laterality: N/A;  . TUBAL LIGATION     Current Outpatient Medications on File Prior to Visit  Medication Sig Dispense Refill  . acetaminophen (TYLENOL) 500 MG tablet Take 500 mg by mouth every 6 (six) hours as needed for moderate pain.    Marland Kitchen aspirin 81 MG tablet Take 81 mg by mouth daily.      . carvedilol (COREG CR) 40 MG 24 hr capsule TAKE 1 CAPSULE EVERY EVENING 90 capsule 1  . clindamycin (CLINDAGEL) 1 % gel Apply 1 application topically daily.    . clopidogrel (PLAVIX) 75 MG tablet TAKE 1 TABLET DAILY 90 tablet 3  . fexofenadine (ALLEGRA) 180 MG tablet Take 180 mg by mouth daily.    . fluticasone (FLONASE) 50 MCG/ACT nasal spray Place 2 sprays into both nostrils daily. 48 g 3  . hydrochlorothiazide (MICROZIDE) 12.5 MG capsule TAKE 1 CAPSULE DAILY (Patient taking differently: TAKE 1 CAPSULE QOD) 90 capsule 3  . isosorbide mononitrate (IMDUR) 60 MG 24 hr tablet TAKE 1 TABLET DAILY 90 tablet 3  . Multiple Vitamins-Minerals (CENTRUM SILVER ADULT 50+ PO) Take 1 tablet by mouth daily.    . nitroGLYCERIN (NITROSTAT) 0.4 MG SL tablet DISSOLVE 1 TABLET UNDER THE TONGUE EVERY 5 MINUTES AS NEEDED FOR CHEST PAIN 25 tablet 1  . predniSONE (DELTASONE) 20 MG  tablet Take 20 mg by mouth once as needed (Lip swelling).    . rosuvastatin (CRESTOR) 20 MG tablet Take 1 tablet (20 mg total) by mouth at bedtime. 90 tablet 3  . SYNTHROID 50 MCG tablet TAKE 1 TABLET ALTERNATING WITH ONE AND ONE-HALF TABLETS AS DIRECTED 108 tablet 3   No current facility-administered medications on file prior to visit.    Allergies  Allergen Reactions  . 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   ALL NARCOTICS- NAUSEA AND VOMITING   . Adhesive [Tape] Itching, Swelling, Rash and Other (See Comments)    Also pain at location  . Codeine Nausea And Vomiting  . Bactrim [Sulfamethoxazole-Trimethoprim]     Muscle aches  . Cephalexin Hives  .  Lisinopril Swelling  . Macrobid [Nitrofurantoin Monohyd Macro] Nausea And Vomiting  . Tamiflu [Oseltamivir Phosphate]    Social History   Socioeconomic History  . Marital status: Married    Spouse name: Not on file  . Number of children: Not on file  . Years of education: Not on file  . Highest education level: Not on file  Occupational History  . Not on file  Social Needs  . Financial resource strain: Not on file  . Food insecurity:    Worry: Not on file    Inability: Not on file  . Transportation needs:    Medical: Not on file    Non-medical: Not on file  Tobacco Use  . Smoking status: Never Smoker  . Smokeless tobacco: Never Used  Substance and Sexual Activity  . Alcohol use: No    Alcohol/week: 0.0 oz  . Drug use: No  . Sexual activity: Not Currently  Lifestyle  . Physical activity:    Days per week: Not on file    Minutes per session: Not on file  . Stress: Not on file  Relationships  . Social connections:    Talks on phone: Not on file    Gets together: Not on file    Attends religious service: Not on file    Active member of club or organization: Not on file    Attends meetings of clubs or organizations: Not on file    Relationship status: Not on file  . Intimate partner violence:    Fear of current or ex partner: Not on file    Emotionally abused: Not on file    Physically abused: Not on file    Forced sexual activity: Not on file  Other Topics Concern  . Not on file  Social History Narrative   Married, 2 children. Retired from [employer] other systems reviewed and are negative.      Objective:   Physical Exam  Constitutional: She appears well-developed and well-nourished. No distress.  HENT:  Nose: Sinus tenderness present. No mucosal edema or rhinorrhea.    Mouth/Throat: Oropharynx is clear and moist. No oropharyngeal exudate.  Neck: Neck supple.  Cardiovascular: Normal rate, regular rhythm and normal heart  sounds.  No murmur heard. Pulmonary/Chest: Effort normal. She has rales.  Lymphadenopathy:    She has no cervical adenopathy.  Skin: She is not diaphoretic.  Vitals reviewed.         Assessment & Plan:  Acute bronchitis, unspecified organism  Given the 1 week history of fever, cough, and pulmonary exam findings, I will treat the patient for  bronchitis with doxycycline 100 mg p.o. twice daily for 7 days.  This should also cover impetigo in the left nostril

## 2018-03-21 ENCOUNTER — Ambulatory Visit: Payer: Medicare Other | Admitting: Cardiovascular Disease

## 2018-03-24 ENCOUNTER — Ambulatory Visit: Payer: Medicare Other | Admitting: Family Medicine

## 2018-03-28 ENCOUNTER — Ambulatory Visit: Payer: Medicare Other | Admitting: Family Medicine

## 2018-04-01 ENCOUNTER — Other Ambulatory Visit: Payer: Self-pay | Admitting: Family Medicine

## 2018-04-03 ENCOUNTER — Ambulatory Visit (INDEPENDENT_AMBULATORY_CARE_PROVIDER_SITE_OTHER): Payer: Medicare Other | Admitting: Allergy

## 2018-04-03 ENCOUNTER — Encounter: Payer: Self-pay | Admitting: Allergy

## 2018-04-03 VITALS — BP 130/80 | HR 72 | Resp 16

## 2018-04-03 DIAGNOSIS — J310 Chronic rhinitis: Secondary | ICD-10-CM | POA: Diagnosis not present

## 2018-04-03 DIAGNOSIS — T783XXD Angioneurotic edema, subsequent encounter: Secondary | ICD-10-CM | POA: Diagnosis not present

## 2018-04-03 MED ORDER — FAMOTIDINE 20 MG PO TABS: 20.0000 mg | ORAL_TABLET | Freq: Two times a day (BID) | ORAL | 1 refills | Status: DC

## 2018-04-03 MED ORDER — FEXOFENADINE HCL 180 MG PO TABS: 180.0000 mg | ORAL_TABLET | Freq: Two times a day (BID) | ORAL | 1 refills | Status: DC

## 2018-04-03 NOTE — Progress Notes (Signed)
Follow-up Note  RE: Lindsey Erickson MRN: [id number] DOB: 14-Jun-1932 Date of Office Visit: 04/03/2018   History of present illness: Lindsey Erickson is a 84 y.o. female presenting today for follow-up of angioedema.  She was last seen in the office on 12/26/17 by myself for initial evaluation.  She states since her last visit she has had a total of 4 additional angioedema episodes of her lips.  She does state that when she is on her antihistamines if she is to have lip swelling but it has been less severe than they were in the past.  She states she has not been taking Allegra every day but will use as needed if she is having the lip swelling.  She does states she is taking Pepcid once a day.  She also was provided a prednisone pack from her PCP previously and states that she will normally take a dose of this when she is having lip swelling.  She denies any difficulty breathing or swallowing.  She denies any swelling of any other body parts.  She has been off of her's lisinopril for almost half a year now. She otherwise denies any major health changes, surgeries or hospitalizations since her last visit.  Review of systems: Review of Systems  Constitutional: Negative for chills, fever and malaise/fatigue.  HENT: Negative for congestion, ear discharge, nosebleeds and sore throat.   Eyes: Negative for pain, discharge and redness.  Respiratory: Negative for cough, shortness of breath and wheezing.   Cardiovascular: Negative for chest pain.  Gastrointestinal: Negative for abdominal pain, constipation, diarrhea, nausea and vomiting.  Musculoskeletal: Negative for joint pain.  Skin: Negative for itching and rash.  Neurological: Negative for headaches.    All other systems negative unless noted above in HPI  Past medical/social/surgical/family history have been reviewed and are unchanged unless specifically indicated below.  No changes  Medication List: Allergies as of 04/03/2018      Reactions    Other Other (See Comments)   Frozen Blood Plasma-caused patient to crash / cardiac arrest / CAN NEVER TAKE THIS AGAIN-PER MD'S.  ALL NARCOTICS- NAUSEA AND VOMITING    Adhesive [tape] Itching, Swelling, Rash, Other (See Comments)   Also pain at location   Codeine Nausea And Vomiting   Bactrim [sulfamethoxazole-trimethoprim]    Muscle aches   Cephalexin Hives   Lisinopril Swelling   Macrobid [nitrofurantoin Monohyd Macro] Nausea And Vomiting   Tamiflu [oseltamivir Phosphate]       Medication List        Accurate as of 04/03/18  1:46 PM. Always use your most recent med list.          acetaminophen 500 MG tablet Commonly known as:  TYLENOL Take 500 mg by mouth every 6 (six) hours as needed for moderate pain.   aspirin 81 MG tablet Take 81 mg by mouth daily.   carvedilol 40 MG 24 hr capsule Commonly known as:  COREG CR TAKE 1 CAPSULE EVERY EVENING   CENTRUM SILVER ADULT 50+ PO Take 1 tablet by mouth daily.   clindamycin 1 % gel Commonly known as:  CLINDAGEL Apply 1 application topically daily.   clopidogrel 75 MG tablet Commonly known as:  PLAVIX TAKE 1 TABLET DAILY   doxycycline 100 MG tablet Commonly known as:  VIBRA-TABS Take 1 tablet (100 mg total) by mouth 2 (two) times daily.   famotidine 20 MG tablet Commonly known as:  PEPCID Take 1 tablet (20 mg total) by mouth 2 (two)  times daily.   fexofenadine 180 MG tablet Commonly known as:  ALLEGRA Take 1 tablet (180 mg total) by mouth 2 (two) times daily.   fluticasone 50 MCG/ACT nasal spray Commonly known as:  FLONASE Place 2 sprays into both nostrils daily.   hydrochlorothiazide 12.5 MG capsule Commonly known as:  MICROZIDE TAKE 1 CAPSULE DAILY   isosorbide mononitrate 60 MG 24 hr tablet Commonly known as:  IMDUR TAKE 1 TABLET DAILY   nitroGLYCERIN 0.4 MG SL tablet Commonly known as:  NITROSTAT DISSOLVE 1 TABLET UNDER THE TONGUE EVERY 5 MINUTES AS NEEDED FOR CHEST PAIN   predniSONE 20 MG  tablet Commonly known as:  DELTASONE Take 20 mg by mouth once as needed (Lip swelling).   rosuvastatin 20 MG tablet Commonly known as:  CRESTOR Take 1 tablet (20 mg total) by mouth at bedtime.   SYNTHROID 50 MCG tablet Generic drug:  levothyroxine TAKE 1 TABLET ALTERNATING WITH ONE AND ONE-HALF TABLETS AS DIRECTED       Known medication allergies: Allergies  Allergen Reactions  . 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   ALL NARCOTICS- NAUSEA AND VOMITING   . Adhesive [Tape] Itching, Swelling, Rash and Other (See Comments)    Also pain at location  . Codeine Nausea And Vomiting  . Bactrim [Sulfamethoxazole-Trimethoprim]     Muscle aches  . Cephalexin Hives  . Lisinopril Swelling  . Macrobid [Nitrofurantoin Monohyd Macro] Nausea And Vomiting  . Tamiflu [Oseltamivir Phosphate]      Physical examination: Blood pressure 130/80, pulse 72, resp. rate 16.  General: Alert, interactive, in no acute distress. HEENT: PERRLA, TMs pearly gray, turbinates non-edematous without discharge, post-pharynx non erythematous. Neck: Supple without lymphadenopathy. Lungs: Clear to auscultation without wheezing, rhonchi or rales. {no increased work of breathing. CV: Normal S1, S2 without murmurs. Abdomen: Nondistended, nontender. Skin: Warm and dry, without lesions or rashes. Extremities:  No clubbing, cyanosis or edema. Neuro:   Grossly intact.  Diagnositics/Labs: Labs:  Component     Latest Ref Rng & Units 12/26/2017  IgE (Immunoglobulin E), Serum     0 - 100 IU/mL 47  D Pteronyssinus IgE     Class 0 kU/L <0.10  D Farinae IgE     Class 0 kU/L <0.10  Cat Dander IgE     Class 0 kU/L <0.10  Dog Dander IgE     Class 0 kU/L <0.10  [place] Grass IgE     Class 0 kU/L <0.10  Timothy Grass IgE     Class 0 kU/L <0.10  Johnson Grass IgE     Class 0 kU/L <0.10  Bahia Grass IgE     Class 0 kU/L  <0.10  Cockroach, American IgE     Class 0 kU/L <0.10  Penicillium Chrysogen IgE     Class 0 kU/L <0.10  Cladosporium Herbarum IgE     Class 0 kU/L <0.10  Aspergillus Fumigatus IgE     Class 0 kU/L <0.10  Mucor Racemosus IgE     Class 0 kU/L <0.10  Alternaria Alternata IgE     Class 0 kU/L <0.10  Stemphylium Herbarum IgE     Class 0 kU/L <0.10  Common Silver Wendee Copp IgE     Class 0 kU/L <0.10  Oak, White IgE     Class 0 kU/L <0.10  Elm, American IgE     Class 0/I kU/L 0.16 (A)  Maple/Box Elder IgE     Class 0 kU/L <0.10  Hickory, White IgE     Class 0 kU/L <0.10  Amer Sycamore IgE Qn     Class 0 kU/L <0.10  White Mulberry IgE     Class 0 kU/L <0.10  Sweet gum IgE RAST Ql     Class 0 kU/L <0.10  Cedar, [place] IgE     Class 0 kU/L <0.10  Ragweed, Short IgE     Class 0 kU/L <0.10  Mugwort IgE Qn     Class 0 kU/L <0.10  Plantain, English IgE     Class 0 kU/L <0.10  Pigweed, Rough IgE     Class 0 kU/L <0.10  Sheep Sorrel IgE Qn     Class 0 kU/L <0.10  Nettle IgE     Class 0 kU/L <0.10  Beef (Bos spp) IgE     <0.35 kU/L <0.10  Class Interpretation      0  Lamb/Mutton (Ovis spp) IgE     <0.35 kU/L <0.10  Class Interpretation      0  Pork (Sus spp) IgE     <0.35 kU/L <0.10  Class Interpretation      0  Alpha Gal IgE*     <0.10 kU/L <0.10  Complement C4, Serum     14 - 44 mg/dL 42  C2 Esterase Inhibitor, Serum     21 - 39 mg/dL 35  Complement C1Q     11.8 - 24.4 mg/dL 16.5  Tryptase     2.2 - 13.2 ug/L 4.6  C1 Est.Inhib.Funct.     %mean normal 101  HAE Interpretation      Comment    Assessment and plan:   Angioedema (lip swelling)     - episodic angioedema can be due to variety of different triggers.  ACE-I (lisinopril) is a known medication that can cause angioedema of the oral cavity.   The swelling can continue for months even despite stopping the medication.  This category of medications should not be taken again in the future.   Lisinopril is  still the most likely culprit to your angioedema at the onset.  Angioedema labs were largely unremarkable except for low level sensitivity to elm tree pollen.      - recommend taking a high-dose antihistamine regimen to help reduce/prevent swelling episodes: take Allegra 180mg  twice daily and  Pepcid 20mg  twice a day.    Do this for the next 2-3 months.  If you have no swelling episodes after 2-3 months recommend weaning as follows: stop evening dose of Pepcid x 1 week then if no swelling episodes stop the evening dose of Allegra x 1 week then if no swelling episodes stop the morning dose of pepcid x 1 week then stop the allegra dose.       - may use prednisone 20mg  for acute onset of lip swelling if occurs again in future as needed  Nasal congestion    -Environmental panel did show very low level sensitivity to elm tree   - Flonase 1-2 sprays each nostril for nasal congestion.  May use 3 days a week (M/W/F) if issues with nosebleeds.   Also recommend use of nasal saline spray prior to use of Flonase.  May use nasal saline multiple times a day if needed for moisturization.    Follow-up 3 months or sooner if needed     I appreciate the opportunity to take part in [place] care. Please do not hesitate  to contact me with questions.  Sincerely,   Prudy Feeler, MD Allergy/Immunology [facility] [place] [facility] [place]

## 2018-04-03 NOTE — Patient Instructions (Addendum)
Angioedema (lip swelling)     - episodic angioedema can be due to variety of different triggers.  ACE-I (lisinopril) is a known medication that can cause angioedema of the oral cavity.   The swelling can continue for months even despite stopping the medication.  This category of medications should not be taken again in the future.   Lisinopril is still the most likely culprit to your angioedema at the onset.  Labs for swelling were largely unremarkable except for low level sensitivity to elm tree pollen.      - recommend taking a high-dose antihistamine regimen to help reduce/prevent swelling episodes: take Allegra 180mg  twice daily and  Pepcid 20mg  twice a day.    Do this for the next 2-3 months.  If you have no swelling episodes after 2-3 months recommend weaning as follows: stop evening dose of Pepcid x 1 week then if no swelling episodes stop the evening dose of Allegra x 1 week then if no swelling episodes stop the morning dose of pepcid x 1 week then stop the allegra dose.       - may use prednisone 20mg  for acute onset of lip swelling if occurs again in future as needed  Nasal congestion and ear fullness   - as above will obtain environmental allergy panel   - Flonase 1-2 sprays each nostril for nasal congestion.  May use 3 days a week (M/W/F) if issues with nosebleeds.   Also recommend use of nasal saline spray prior to use of Flonase.  May use nasal saline multiple times a day if needed for moisturization.    Follow-up 3 months or sooner if needed

## 2018-04-03 NOTE — Addendum Note (Signed)
Addended by: Herbie Drape on: 04/03/2018 04:59 PM   Modules accepted: Orders

## 2018-04-04 ENCOUNTER — Other Ambulatory Visit: Payer: Self-pay

## 2018-04-04 MED ORDER — FEXOFENADINE HCL 180 MG PO TABS: 180.0000 mg | ORAL_TABLET | Freq: Two times a day (BID) | ORAL | 1 refills | Status: DC

## 2018-04-06 ENCOUNTER — Telehealth: Payer: Self-pay | Admitting: Allergy

## 2018-04-06 NOTE — Telephone Encounter (Signed)
Patient said her prescriptions are still not right. [facility] will not send them until they are right. Patient is requesting you call her first, because she needs to talk  someone about it and she has a specific phone number to give.

## 2018-04-06 NOTE — Telephone Encounter (Signed)
Spoke to patient states she would like [place] to [facility] since [facility] are [facility] her medications until we reach out to them. Writer called pharmacy and they needed clarification on dosage for fexofenadine. Clarified with pharmacy and they are going to ship medication

## 2018-04-06 NOTE — Telephone Encounter (Signed)
Patient advised we spoke to them

## 2018-04-10 ENCOUNTER — Encounter: Payer: Self-pay | Admitting: Physician Assistant

## 2018-04-10 ENCOUNTER — Ambulatory Visit (INDEPENDENT_AMBULATORY_CARE_PROVIDER_SITE_OTHER): Payer: Medicare Other | Admitting: Physician Assistant

## 2018-04-10 VITALS — BP 126/72 | HR 76 | Temp 98.1°F | Ht 67.0 in | Wt 169.4 lb

## 2018-04-10 DIAGNOSIS — N3000 Acute cystitis without hematuria: Secondary | ICD-10-CM | POA: Diagnosis not present

## 2018-04-10 DIAGNOSIS — R309 Painful micturition, unspecified: Secondary | ICD-10-CM | POA: Diagnosis not present

## 2018-04-10 LAB — URINALYSIS, ROUTINE W REFLEX MICROSCOPIC
Bilirubin Urine: NEGATIVE
Glucose, UA: NEGATIVE
Hgb urine dipstick: NEGATIVE
Ketones, ur: NEGATIVE
Nitrite: NEGATIVE
Protein, ur: NEGATIVE
RBC / HPF: NONE SEEN /HPF (ref 0–2)
Specific Gravity, Urine: 1.01 (ref 1.001–1.03)
pH: 7 (ref 5.0–8.0)

## 2018-04-10 LAB — MICROSCOPIC MESSAGE

## 2018-04-10 MED ORDER — CIPROFLOXACIN HCL 500 MG PO TABS: 500.0000 mg | ORAL_TABLET | Freq: Two times a day (BID) | ORAL | 0 refills | Status: AC

## 2018-04-10 NOTE — Progress Notes (Signed)
Patient ID: Lindsey Erickson MRN: [id number], DOB: 11/17/1932, 84 y.o. Date of Encounter: 04/10/2018, 3:20 PM    Chief Complaint:  Chief Complaint  Patient presents with  . Urinary Tract Infection    Has c/o of painful urination, and urinary frequency     HPI: 84 y.o. year old female presents with above.   She reports that at the end of last week she noticed some burning with urination. Reports that this worsened over the weekend. Feels burning sensation in urethra area right after she urinates. Also having urinary frequency.  Has had no fever, chills. No back pain.      Home Meds:   Outpatient Medications Prior to Visit  Medication Sig Dispense Refill  . acetaminophen (TYLENOL) 500 MG tablet Take 500 mg by mouth every 6 (six) hours as needed for moderate pain.    Marland Kitchen aspirin 81 MG tablet Take 81 mg by mouth daily.      . carvedilol (COREG CR) 40 MG 24 hr capsule TAKE 1 CAPSULE EVERY EVENING 90 capsule 1  . clindamycin (CLINDAGEL) 1 % gel Apply 1 application topically daily.    . clopidogrel (PLAVIX) 75 MG tablet TAKE 1 TABLET DAILY 90 tablet 3  . doxycycline (VIBRA-TABS) 100 MG tablet Take 1 tablet (100 mg total) by mouth 2 (two) times daily. 14 tablet 0  . famotidine (PEPCID) 20 MG tablet Take 1 tablet (20 mg total) by mouth 2 (two) times daily. 180 tablet 1  . fexofenadine (ALLEGRA) 180 MG tablet Take 1 tablet (180 mg total) by mouth 2 (two) times daily. 180 tablet 1  . fluticasone (FLONASE) 50 MCG/ACT nasal spray Place 2 sprays into both nostrils daily. 48 g 3  . hydrochlorothiazide (MICROZIDE) 12.5 MG capsule TAKE 1 CAPSULE DAILY (Patient taking differently: TAKE 1 CAPSULE QOD) 90 capsule 3  . isosorbide mononitrate (IMDUR) 60 MG 24 hr tablet TAKE 1 TABLET DAILY 90 tablet 3  . Multiple Vitamins-Minerals (CENTRUM SILVER ADULT 50+ PO) Take 1 tablet by mouth daily.    . nitroGLYCERIN (NITROSTAT) 0.4 MG SL tablet DISSOLVE 1 TABLET UNDER THE TONGUE EVERY 5 MINUTES AS NEEDED FOR  CHEST PAIN 25 tablet 1  . predniSONE (DELTASONE) 20 MG tablet Take 20 mg by mouth once as needed (Lip swelling).    . rosuvastatin (CRESTOR) 20 MG tablet Take 1 tablet (20 mg total) by mouth at bedtime. 90 tablet 3  . SYNTHROID 50 MCG tablet TAKE 1 TABLET ALTERNATING WITH ONE AND ONE-HALF TABLETS AS DIRECTED 108 tablet 3   No facility-administered medications prior to visit.     Allergies:  Allergies  Allergen Reactions  . 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   ALL NARCOTICS- NAUSEA AND VOMITING   . Adhesive [Tape] Itching, Swelling, Rash and Other (See Comments)    Also pain at location  . Codeine Nausea And Vomiting  . Bactrim [Sulfamethoxazole-Trimethoprim]     Muscle aches  . Cephalexin Hives  . Lisinopril Swelling  . Macrobid [Nitrofurantoin Monohyd Macro] Nausea And Vomiting  . Tamiflu [Oseltamivir Phosphate]       Review of Systems: See HPI for pertinent ROS. All other ROS negative.    Physical Exam: Blood pressure 126/72, pulse 76, temperature 98.1 F (36.7 C), temperature source Oral, height 5\' 7"  (1.702 m), weight 76.8 kg (169 lb 6 oz), SpO2 96 %., Body mass index is 26.53 kg/m. General:  WNWD WF. Appears in no acute distress.  Neck: Supple. No thyromegaly. No lymphadenopathy. Lungs: Clear bilaterally to auscultation without wheezes, rales, or rhonchi. Breathing is unlabored. Heart: Regular rhythm. No murmurs, rubs, or gallops. Abdomen: Soft, non-tender, non-distended with normoactive bowel sounds. No hepatomegaly. No rebound/guarding. No obvious abdominal masses. Msk:  Strength and tone normal for age. No tenderness with percussion of costophrenic angles bilaterally. Extremities/Skin: Warm and dry.  Neuro: Alert and oriented X 3. Moves all extremities spontaneously. Gait is normal. CNII-XII grossly in tact. Psych:  Responds to questions appropriately with a normal affect.     Results for orders placed or performed in visit on 04/10/18  Urinalysis, Routine w reflex microscopic  Result Value Ref Range   Color, Urine YELLOW YELLOW   APPearance CLOUDY (A) CLEAR   Specific Gravity, Urine 1.010 1.001 - 1.03   pH 7.0 5.0 - 8.0   Glucose, UA NEGATIVE NEGATIVE   Bilirubin Urine NEGATIVE NEGATIVE   Ketones, ur NEGATIVE NEGATIVE   Hgb urine dipstick NEGATIVE NEGATIVE   Protein, ur NEGATIVE NEGATIVE   Nitrite NEGATIVE NEGATIVE   Leukocytes, UA 3+ (A) NEGATIVE   WBC, UA 6-10 (A) 0 - 5 /HPF   RBC / HPF NONE SEEN 0 - 2 /HPF   Squamous Epithelial / LPF 0-5 < OR = 5 /HPF   Bacteria, UA FEW (A) NONE SEEN /HPF  Microscopic Message  Result Value Ref Range   Note       ASSESSMENT AND PLAN:  84 y.o. year old female with  1. Acute cystitis without hematuria She has allergy to Bactrim, Keflex, Macrobid.  Will treat with Cipro. Will send culture and f/u culture report also. - ciprofloxacin (CIPRO) 500 MG tablet; Take 1 tablet (500 mg total) by mouth 2 (two) times daily for 7 days.  Dispense: 14 tablet; Refill: 0 - Urine Culture  2. Painful urination - Urinalysis, Routine w reflex microscopic   Signed, [address] [place], [place], [facility] 04/10/2018 3:20 PM

## 2018-04-11 ENCOUNTER — Other Ambulatory Visit: Payer: Medicare Other

## 2018-04-11 DIAGNOSIS — Z0000 Encounter for general adult medical examination without abnormal findings: Secondary | ICD-10-CM

## 2018-04-11 LAB — CBC WITH DIFFERENTIAL/PLATELET
Basophils Absolute: 59 cells/uL (ref 0–200)
Basophils Relative: 1.2 %
Eosinophils Absolute: 338 cells/uL (ref 15–500)
Eosinophils Relative: 6.9 %
HCT: 38.7 % (ref 35.0–45.0)
Hemoglobin: 13 g/dL (ref 11.7–15.5)
Lymphs Abs: 1401 cells/uL (ref 850–3900)
MCH: 31.9 pg (ref 27.0–33.0)
MCHC: 33.6 g/dL (ref 32.0–36.0)
MCV: 95.1 fL (ref 80.0–100.0)
MONOS PCT: 11.4 %
MPV: 10.3 fL (ref 7.5–12.5)
NEUTROS PCT: 51.9 %
Neutro Abs: 2543 cells/uL (ref 1500–7800)
PLATELETS: 191 10*3/uL (ref 140–400)
RBC: 4.07 10*6/uL (ref 3.80–5.10)
RDW: 12.5 % (ref 11.0–15.0)
TOTAL LYMPHOCYTE: 28.6 %
WBC: 4.9 10*3/uL (ref 3.8–10.8)
WBCMIX: 559 {cells}/uL (ref 200–950)

## 2018-04-11 LAB — COMPLETE METABOLIC PANEL WITH GFR
AG RATIO: 1.5 (calc) (ref 1.0–2.5)
ALBUMIN MSPROF: 3.9 g/dL (ref 3.6–5.1)
ALKALINE PHOSPHATASE (APISO): 54 U/L (ref 33–130)
ALT: 15 U/L (ref 6–29)
AST: 19 U/L (ref 10–35)
BILIRUBIN TOTAL: 0.6 mg/dL (ref 0.2–1.2)
BUN: 15 mg/dL (ref 7–25)
CHLORIDE: 103 mmol/L (ref 98–110)
CO2: 30 mmol/L (ref 20–32)
CREATININE: 0.82 mg/dL (ref 0.60–0.88)
Calcium: 9.7 mg/dL (ref 8.6–10.4)
GFR, Est African American: 77 mL/min/{1.73_m2} (ref >=60)
GFR, Est Non African American: 66 mL/min/{1.73_m2} (ref >=60)
GLOBULIN: 2.6 g/dL (ref 1.9–3.7)
Glucose, Bld: 106 mg/dL — ABNORMAL HIGH (ref 65–99)
Potassium: 4.5 mmol/L (ref 3.5–5.3)
SODIUM: 140 mmol/L (ref 135–146)
Total Protein: 6.5 g/dL (ref 6.1–8.1)

## 2018-04-11 LAB — LIPID PANEL
CHOL/HDL RATIO: 2.9 (calc) (ref <5.0)
Cholesterol: 160 mg/dL (ref <200)
HDL: 56 mg/dL (ref >50)
LDL CHOLESTEROL (CALC): 76 mg/dL
NON-HDL CHOLESTEROL (CALC): 104 mg/dL (ref <130)
TRIGLYCERIDES: 181 mg/dL — AB (ref <150)

## 2018-04-11 NOTE — Progress Notes (Unsigned)
[id number]

## 2018-04-12 LAB — URINE CULTURE
MICRO NUMBER:: 90610557
SPECIMEN QUALITY:: ADEQUATE

## 2018-04-13 ENCOUNTER — Ambulatory Visit (INDEPENDENT_AMBULATORY_CARE_PROVIDER_SITE_OTHER): Payer: Medicare Other | Admitting: Family Medicine

## 2018-04-13 ENCOUNTER — Encounter: Payer: Self-pay | Admitting: Family Medicine

## 2018-04-13 ENCOUNTER — Other Ambulatory Visit: Payer: Self-pay

## 2018-04-13 ENCOUNTER — Telehealth: Payer: Self-pay | Admitting: Family Medicine

## 2018-04-13 VITALS — BP 124/68 | HR 74 | Temp 97.9°F | Resp 14 | Ht 67.0 in | Wt 168.0 lb

## 2018-04-13 DIAGNOSIS — Z0000 Encounter for general adult medical examination without abnormal findings: Secondary | ICD-10-CM | POA: Diagnosis not present

## 2018-04-13 DIAGNOSIS — E7800 Pure hypercholesterolemia, unspecified: Secondary | ICD-10-CM | POA: Diagnosis not present

## 2018-04-13 DIAGNOSIS — I2510 Atherosclerotic heart disease of native coronary artery without angina pectoris: Secondary | ICD-10-CM

## 2018-04-13 DIAGNOSIS — E049 Nontoxic goiter, unspecified: Secondary | ICD-10-CM | POA: Diagnosis not present

## 2018-04-13 MED ORDER — ROSUVASTATIN CALCIUM 20 MG PO TABS: 20.0000 mg | ORAL_TABLET | Freq: Every day | ORAL | 3 refills | Status: DC

## 2018-04-13 MED ORDER — CARVEDILOL PHOSPHATE ER 40 MG PO CP24: ORAL_CAPSULE | ORAL | 1 refills | Status: DC

## 2018-04-13 MED ORDER — ISOSORBIDE MONONITRATE ER 60 MG PO TB24: 60.0000 mg | ORAL_TABLET | Freq: Every day | ORAL | 3 refills | Status: DC

## 2018-04-13 NOTE — Telephone Encounter (Signed)
Prescription sent to pharmacy.

## 2018-04-13 NOTE — Telephone Encounter (Signed)
Patient was in today to see this patient, however she forgot to tell dr pickard she needed refill on her carvedilol, isosorbide, and rosuvastatin [facility]  [phone]

## 2018-04-13 NOTE — Progress Notes (Signed)
Subjective:    Patient ID: Lindsey Erickson, female    DOB: 1932/04/26, 84 y.o.   MRN: [id number]  HPI  Patient is here today for complete physical exam.  Regarding her immunizations, her immunizations are up-to-date except for tetanus shot and Shingrix. Immunization History  Administered Date(s) Administered  . Influenza Whole 07/23/2009  . Influenza,inj,Quad PF,6+ Mos 09/22/2015, 08/17/2016, 08/24/2017  . Influenza-Unspecified 08/22/2013, 09/02/2014  . Pneumococcal Conjugate-13 01/14/2014  . Pneumococcal Polysaccharide-23 02/21/2012  . Zoster 09/23/2006   Due to her age, it is no longer recommended that she have Pap smear, colonoscopy, or mammogram.  We discussed this in the patient is in agreement.  She states that she doubts she would want treatment even if she knew she had a problem and therefore I question the utility of doing these test.  Therefore we will not discuss cancer screening.  Her last bone density test was last year and was excellent.  She is due for repeat in 2020.  Her most recent lab work as listed below.  Of note on her exam, she has a pronounced large goiter.  Seems to be enlarging compared her last visit.  She denies any trouble swallowing.  She denies any pain.  Her TSH was checked and found to be normal in January.  She is on Synthroid to suppress the growth of the goiter. Past Medical History:  Diagnosis Date  . Anemia   . Arthritis   . Blood transfusion without reported diagnosis 1957  . CAD (coronary artery disease)    a. NSTEMI 2004 with thrombus in Cx and mod RCA. b. Nonischemic nuc 2008. c. mild NSTEMI 01/2013 (trop 0.42) -> cath with unchanged mod LAD & RCA disease, for continued medical therapy.  . Cancer ([place])   . Cataract   . H/O Hashimoto thyroiditis   . Hyperlipidemia   . HYPERTENSION    a. Also has h/o intermittentl low BP.  Marland Kitchen Hypothyroidism   . Kidney stones   . Myocardial infarction ([place])   . Osteoporosis    no fractures  . Prediabetes   .  QT prolongation    a. Prior EKGs QTc 474, but on 4/1 was 524 for unclear reason.  . Recurrent urinary tract infection   . Squamous cell carcinoma of skin 04/17/2015   Past Surgical History:  Procedure Laterality Date  . APPENDECTOMY  1999  . Bilateral Arthroscopies of knees    . CARDIAC CATHETERIZATION     Moderate 3 vessel CAD by cath 05/2012  . CHOLECYSTECTOMY  02/2010  . JOINT REPLACEMENT  01/2010   L TKR - allusio  . LEFT HEART CATHETERIZATION WITH CORONARY ANGIOGRAM N/A 05/22/2012   Procedure: LEFT HEART CATHETERIZATION WITH CORONARY ANGIOGRAM;  Surgeon: Sinclair Grooms, MD;  Location[facility] C[facility] LAB;  Service: Cardiovascular;  Laterality: N/A;  . LEFT HEART CATHETERIZATION WITH CORONARY ANGIOGRAM N/A 02/20/2014   Procedure: LEFT HEART CATHETERIZATION WITH CORONARY ANGIOGRAM;  Surgeon: Peter M Martinique, MD;  Location: [facility] CATH LAB;  Service: Cardiovascular;  Laterality: N/A;  . TUBAL LIGATION     Current Outpatient Medications on File Prior to Visit  Medication Sig Dispense Refill  . acetaminophen (TYLENOL) 500 MG tablet Take 500 mg by mouth every 6 (six) hours as needed for moderate pain.    Marland Kitchen aspirin 81 MG tablet Take 81 mg by mouth daily.      . carvedilol (COREG CR) 40 MG 24 hr capsule TAKE 1 CAPSULE EVERY EVENING 90 capsule 1  .  ciprofloxacin (CIPRO) 500 MG tablet Take 1 tablet (500 mg total) by mouth 2 (two) times daily for 7 days. 14 tablet 0  . clindamycin (CLINDAGEL) 1 % gel Apply 1 application topically daily.    . clopidogrel (PLAVIX) 75 MG tablet TAKE 1 TABLET DAILY 90 tablet 3  . famotidine (PEPCID) 20 MG tablet Take 1 tablet (20 mg total) by mouth 2 (two) times daily. 180 tablet 1  . fexofenadine (ALLEGRA) 180 MG tablet Take 1 tablet (180 mg total) by mouth 2 (two) times daily. 180 tablet 1  . fluticasone (FLONASE) 50 MCG/ACT nasal spray Place 2 sprays into both nostrils daily. 48 g 3  . hydrochlorothiazide (MICROZIDE) 12.5 MG capsule TAKE 1 CAPSULE DAILY (Patient taking  differently: TAKE 1 CAPSULE QOD) 90 capsule 3  . isosorbide mononitrate (IMDUR) 60 MG 24 hr tablet TAKE 1 TABLET DAILY 90 tablet 3  . Multiple Vitamins-Minerals (CENTRUM SILVER ADULT 50+ PO) Take 1 tablet by mouth daily.    . nitroGLYCERIN (NITROSTAT) 0.4 MG SL tablet DISSOLVE 1 TABLET UNDER THE TONGUE EVERY 5 MINUTES AS NEEDED FOR CHEST PAIN 25 tablet 1  . predniSONE (DELTASONE) 20 MG tablet Take 20 mg by mouth once as needed (Lip swelling).    . rosuvastatin (CRESTOR) 20 MG tablet Take 1 tablet (20 mg total) by mouth at bedtime. 90 tablet 3  . SYNTHROID 50 MCG tablet TAKE 1 TABLET ALTERNATING WITH ONE AND ONE-HALF TABLETS AS DIRECTED 108 tablet 3   No current facility-administered medications on file prior to visit.    Allergies  Allergen Reactions  . 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   ALL NARCOTICS- NAUSEA AND VOMITING   . Adhesive [Tape] Itching, Swelling, Rash and Other (See Comments)    Also pain at location  . Codeine Nausea And Vomiting  . Bactrim [Sulfamethoxazole-Trimethoprim]     Muscle aches  . Cephalexin Hives  . Lisinopril Swelling  . Macrobid [Nitrofurantoin Monohyd Macro] Nausea And Vomiting  . Tamiflu [Oseltamivir Phosphate]    Social History   Socioeconomic History  . Marital status: Married    Spouse name: Not on file  . Number of children: Not on file  . Years of education: Not on file  . Highest education level: Not on file  Occupational History  . Not on file  Social Needs  . Financial resource strain: Not on file  . Food insecurity:    Worry: Not on file    Inability: Not on file  . Transportation needs:    Medical: Not on file    Non-medical: Not on file  Tobacco Use  . Smoking status: Never Smoker  . Smokeless tobacco: Never Used  Substance and Sexual Activity  . Alcohol use: No    Alcohol/week: 0.0 oz  . Drug use: No  . Sexual activity: Not  Currently  Lifestyle  . Physical activity:    Days per week: Not on file    Minutes per session: Not on file  . Stress: Not on file  Relationships  . Social connections:    Talks on phone: Not on file    Gets together: Not on file    Attends religious service: Not on file    Active member of club or organization: Not on file    Attends meetings of clubs or organizations: Not on file    Relationship status: Not on file  .  Intimate partner violence:    Fear of current or ex partner: Not on file    Emotionally abused: Not on file    Physically abused: Not on file    Forced sexual activity: Not on file  Other Topics Concern  . Not on file  Social History Narrative   Married, 2 children. Retired from [employer]   Family History  Problem Relation Age of Onset  . Allergies Father   . Cancer Father   . Coronary artery disease Mother 71  . Heart disease Mother   . CAD Sister 15  . Cancer Sister   . AAA (abdominal aortic aneurysm) Sister   . Hypertension Sister   . Hyperlipidemia Sister   . Diabetes Sister   . Heart disease Sister   . Diabetes Maternal Aunt      Review of Systems  All other systems reviewed and are negative.      Objective:   Physical Exam  Constitutional: She is oriented to person, place, and time. She appears well-developed and well-nourished. No distress.  HENT:  Head: Normocephalic and atraumatic.  Right Ear: External ear normal.  Left Ear: External ear normal.  Nose: Nose normal.  Mouth/Throat: Oropharynx is clear and moist. No oropharyngeal exudate.  Eyes: Pupils are equal, round, and reactive to light. Conjunctivae and EOM are normal. Right eye exhibits no discharge. Left eye exhibits no discharge. No scleral icterus.  Neck: Normal range of motion. No JVD present. No tracheal deviation present. Thyromegaly present.  Cardiovascular: Normal rate, regular rhythm and normal heart sounds. Exam reveals no gallop and no friction rub.  No murmur  heard. Pulmonary/Chest: Effort normal and breath sounds normal. No stridor. No respiratory distress. She has no wheezes. She has no rales. She exhibits no tenderness.  Abdominal: Soft. Bowel sounds are normal. She exhibits no distension and no mass. There is no tenderness. There is no rebound and no guarding. No hernia.  Musculoskeletal: Normal range of motion. She exhibits no edema, tenderness or deformity.  Lymphadenopathy:    She has no cervical adenopathy.  Neurological: She is alert and oriented to person, place, and time. She displays normal reflexes. No cranial nerve deficit or sensory deficit. She exhibits normal muscle tone. Coordination normal.  Skin: Skin is warm. No rash noted. She is not diaphoretic. No erythema. No pallor.  Psychiatric: She has a normal mood and affect. Her behavior is normal. Thought content normal.  Vitals reviewed.         Assessment & Plan:  Goiter - Plan: US Soft Tissue Head/Neck  Coronary artery disease involving native coronary artery of native heart without angina pectoris  Pure hypercholesterolemia  General medical exam  Kidney stones  We discussed her immunizations.  Patient declines a tetanus shot.  I recommended shin Grix however I recommended the patient check on the price first.  Her lab work was excellent except for fasting blood sugar of 106.  Recommended a low carbohydrate diet.  Her LDL cholesterol is slightly elevated at 76 however the patient declines to increase her Crestor to 40 mg a day.  Her exam is reassuring except for what appears to be enlarging goiter.  I would not recommend surgery to remove the goiter given the fact the patient denies any trouble breathing or swallowing.  She states that she is gotten used to it.  However I would like to repeat the ultrasound to ensure that there is not an underlying mass causing what appears to be the growth  of the goiter.  Therefore I will schedule her for an ultrasound.

## 2018-04-20 ENCOUNTER — Other Ambulatory Visit: Payer: Self-pay | Admitting: Family Medicine

## 2018-04-20 DIAGNOSIS — E049 Nontoxic goiter, unspecified: Secondary | ICD-10-CM

## 2018-04-20 NOTE — Progress Notes (Signed)
Us/

## 2018-04-25 ENCOUNTER — Ambulatory Visit
Admission: RE | Admit: 2018-04-25 | Discharge: 2018-04-25 | Disposition: A | Discharge status: Home / Self Care | Payer: Medicare Other | Source: Ambulatory Visit | Attending: Family Medicine | Admitting: Family Medicine

## 2018-04-28 ENCOUNTER — Other Ambulatory Visit: Payer: Self-pay | Admitting: Family Medicine

## 2018-04-28 ENCOUNTER — Telehealth: Payer: Self-pay | Admitting: Family Medicine

## 2018-04-28 MED ORDER — ROSUVASTATIN CALCIUM 20 MG PO TABS: 20.0000 mg | ORAL_TABLET | Freq: Every day | ORAL | 3 refills | Status: DC

## 2018-04-28 NOTE — Telephone Encounter (Signed)
Pt called and informed of recommendations

## 2018-04-28 NOTE — Telephone Encounter (Signed)
Patient lvm stating she received her results via Mychart for her [place] she states it says something about a biopsy and she would like to hear from Dr. Dennard Schaumann what needs to be done next.  CB# [phone]

## 2018-05-01 ENCOUNTER — Other Ambulatory Visit: Payer: Self-pay | Admitting: Family Medicine

## 2018-05-01 DIAGNOSIS — E049 Nontoxic goiter, unspecified: Secondary | ICD-10-CM

## 2018-05-02 ENCOUNTER — Observation Stay (HOSPITAL_COMMUNITY)
Admission: EM | Admit: 2018-05-02 | Discharge: 2018-05-03 | Disposition: A | Discharge status: Home / Self Care | Payer: Medicare Other | Attending: Internal Medicine | Admitting: Internal Medicine

## 2018-05-02 ENCOUNTER — Encounter (HOSPITAL_COMMUNITY): Payer: Self-pay | Admitting: Emergency Medicine

## 2018-05-02 ENCOUNTER — Emergency Department (HOSPITAL_COMMUNITY): Payer: Medicare Other

## 2018-05-02 ENCOUNTER — Other Ambulatory Visit: Payer: Self-pay

## 2018-05-02 DIAGNOSIS — E039 Hypothyroidism, unspecified: Secondary | ICD-10-CM | POA: Diagnosis present

## 2018-05-02 DIAGNOSIS — Z87440 Personal history of urinary (tract) infections: Secondary | ICD-10-CM | POA: Insufficient documentation

## 2018-05-02 DIAGNOSIS — M810 Age-related osteoporosis without current pathological fracture: Secondary | ICD-10-CM | POA: Insufficient documentation

## 2018-05-02 DIAGNOSIS — E063 Autoimmune thyroiditis: Secondary | ICD-10-CM | POA: Diagnosis not present

## 2018-05-02 DIAGNOSIS — Z7984 Long term (current) use of oral hypoglycemic drugs: Secondary | ICD-10-CM | POA: Insufficient documentation

## 2018-05-02 DIAGNOSIS — E785 Hyperlipidemia, unspecified: Secondary | ICD-10-CM | POA: Diagnosis present

## 2018-05-02 DIAGNOSIS — Z7902 Long term (current) use of antithrombotics/antiplatelets: Secondary | ICD-10-CM | POA: Diagnosis not present

## 2018-05-02 DIAGNOSIS — Z885 Allergy status to narcotic agent status: Secondary | ICD-10-CM | POA: Diagnosis not present

## 2018-05-02 DIAGNOSIS — I252 Old myocardial infarction: Secondary | ICD-10-CM | POA: Diagnosis not present

## 2018-05-02 DIAGNOSIS — R0789 Other chest pain: Secondary | ICD-10-CM | POA: Diagnosis present

## 2018-05-02 DIAGNOSIS — I10 Essential (primary) hypertension: Secondary | ICD-10-CM | POA: Diagnosis not present

## 2018-05-02 DIAGNOSIS — Z79899 Other long term (current) drug therapy: Secondary | ICD-10-CM | POA: Insufficient documentation

## 2018-05-02 DIAGNOSIS — Z96652 Presence of left artificial knee joint: Secondary | ICD-10-CM | POA: Diagnosis not present

## 2018-05-02 DIAGNOSIS — I2510 Atherosclerotic heart disease of native coronary artery without angina pectoris: Secondary | ICD-10-CM | POA: Diagnosis present

## 2018-05-02 DIAGNOSIS — Z85828 Personal history of other malignant neoplasm of skin: Secondary | ICD-10-CM | POA: Diagnosis not present

## 2018-05-02 DIAGNOSIS — Z9049 Acquired absence of other specified parts of digestive tract: Secondary | ICD-10-CM | POA: Insufficient documentation

## 2018-05-02 DIAGNOSIS — M1711 Unilateral primary osteoarthritis, right knee: Secondary | ICD-10-CM | POA: Insufficient documentation

## 2018-05-02 DIAGNOSIS — Z7982 Long term (current) use of aspirin: Secondary | ICD-10-CM | POA: Insufficient documentation

## 2018-05-02 DIAGNOSIS — T50905A Adverse effect of unspecified drugs, medicaments and biological substances, initial encounter: Secondary | ICD-10-CM | POA: Insufficient documentation

## 2018-05-02 DIAGNOSIS — T783XXA Angioneurotic edema, initial encounter: Secondary | ICD-10-CM | POA: Diagnosis not present

## 2018-05-02 DIAGNOSIS — R7303 Prediabetes: Secondary | ICD-10-CM | POA: Insufficient documentation

## 2018-05-02 DIAGNOSIS — Z888 Allergy status to other drugs, medicaments and biological substances status: Secondary | ICD-10-CM | POA: Diagnosis not present

## 2018-05-02 DIAGNOSIS — Z91048 Other nonmedicinal substance allergy status: Secondary | ICD-10-CM | POA: Diagnosis not present

## 2018-05-02 DIAGNOSIS — Z7951 Long term (current) use of inhaled steroids: Secondary | ICD-10-CM | POA: Insufficient documentation

## 2018-05-02 DIAGNOSIS — Z87442 Personal history of urinary calculi: Secondary | ICD-10-CM | POA: Insufficient documentation

## 2018-05-02 DIAGNOSIS — M542 Cervicalgia: Secondary | ICD-10-CM

## 2018-05-02 DIAGNOSIS — Z881 Allergy status to other antibiotic agents status: Secondary | ICD-10-CM | POA: Diagnosis not present

## 2018-05-02 LAB — BASIC METABOLIC PANEL
Anion gap: 9 (ref 5–15)
BUN: 14 mg/dL (ref 6–20)
CO2: 25 mmol/L (ref 22–32)
CREATININE: 0.86 mg/dL (ref 0.44–1.00)
Calcium: 8.9 mg/dL (ref 8.9–10.3)
Chloride: 105 mmol/L (ref 101–111)
GFR calc Af Amer: >60 mL/min (ref >60)
Glucose, Bld: 113 mg/dL — ABNORMAL HIGH (ref 65–99)
Potassium: 3.4 mmol/L — ABNORMAL LOW (ref 3.5–5.1)
SODIUM: 139 mmol/L (ref 135–145)

## 2018-05-02 LAB — CBC
HCT: 36.3 % (ref 36.0–46.0)
Hemoglobin: 12.2 g/dL (ref 12.0–15.0)
MCH: 32.9 pg (ref 26.0–34.0)
MCHC: 33.6 g/dL (ref 30.0–36.0)
MCV: 97.8 fL (ref 78.0–100.0)
PLATELETS: 195 10*3/uL (ref 150–400)
RBC: 3.71 MIL/uL — ABNORMAL LOW (ref 3.87–5.11)
RDW: 12.7 % (ref 11.5–15.5)
WBC: 6.6 10*3/uL (ref 4.0–10.5)

## 2018-05-02 LAB — I-STAT TROPONIN, ED: Troponin i, poc: 0.01 ng/mL (ref 0.00–0.08)

## 2018-05-02 NOTE — ED Provider Notes (Addendum)
[facility]ERGENCY DEPARTMENT Provider Note   CSN: [id number] Arrival date & time: 05/02/18  2226     History   Chief Complaint Chief Complaint  Patient presents with  . Chest Pain    HPI Lindsey Erickson is a 84 y.o. female.  HPI 84 year old Caucasian female past medical history significant for CAD, NSTEMI with cath in 2015 showing moderate disease, hypertension, hypothyroidism, hyperlipidemia that presents to the emergency department today for evaluation of chest pain, neck fullness, shortness of breath.  Patient states that this evening prior to arrival she had an episode of "neck fullness and head fullness".  Patient states that she also developed some left-sided chest pain that did not radiate.  She reports that the chest pain quickly resolved after several seconds.  Describes as a pressure.  She reports some shortness of breath that has since resolved.  She states that now she is just having fullness in her neck.  She states that she has a history of 2-3 MIs in the past and this is the exact same symptoms that she was having.  She did take 324 aspirin prior to arrival.  Denies taking any nitro.  Patient reports some palpitations during this episode and felt flushed but this has quickly resolved.  Patient states that the chest pain is not exertional.  Is not pleuritic in nature.  Patient is followed by cardiology with recent catheterization in 2015 that showed moderate disease.  Patient denies any cough or leg swelling.  She does report having history of angioedema and states that she had an episode of some swelling to her lower lip yesterday.  She took prednisone at home.  She does have history of angioedema seen by her dermatologist.  Patient states that this self resolved yesterday.  She states that when she was having the episode today she felt her lip was swelling however did not take any prednisone.  Patient denies any difficulties breathing or swallowing.  Denies  any abdominal pain.  Denies any numbness or tingling in extremities.  Denies any weakness in extremities.  No associated nausea, vomiting or diaphoresis.   Pt denies any fever, chill, ha, vision changes, dizziness, congestion, , cough, abd pain, n/v/d, urinary symptoms, change in bowel habits, melena, hematochezia, lower extremity paresthesias.  Past Medical History:  Diagnosis Date  . Anemia   . Arthritis   . Blood transfusion without reported diagnosis 1957  . CAD (coronary artery disease)    a. NSTEMI 2004 with thrombus in Cx and mod RCA. b. Nonischemic nuc 2008. c. mild NSTEMI 01/2013 (trop 0.42) -> cath with unchanged mod LAD & RCA disease, for continued medical therapy.  . Cancer ([place])   . Cataract   . H/O Hashimoto thyroiditis   . Hyperlipidemia   . HYPERTENSION    a. Also has h/o intermittentl low BP.  Marland Kitchen Hypothyroidism   . Kidney stones   . Myocardial infarction ([place])   . Osteoporosis    no fractures  . Prediabetes   . QT prolongation    a. Prior EKGs QTc 474, but on 4/1 was 524 for unclear reason.  . Recurrent urinary tract infection   . Squamous cell carcinoma of skin 04/17/2015    Patient Active Problem List   Diagnosis Date Noted  . Squamous cell carcinoma of skin 04/17/2015  . Cystitis 08/03/2014  . NSTEMI (non-ST elevated myocardial infarction) ([place]) 02/21/2014  . QT prolongation 02/21/2014  . CAD (coronary artery disease)   . Hyperlipidemia   .  Multinodular goiter 10/03/2013  . Osteoarthritis of right knee   . Osteoporosis with fracture hx 02/15/2011  . Hypothyroid 02/15/2011  . Kidney stones   . Essential hypertension 06/22/2010  . Other diseases of lung, not elsewhere classified 02/25/2010    Past Surgical History:  Procedure Laterality Date  . APPENDECTOMY  1999  . Bilateral Arthroscopies of knees    . CARDIAC CATHETERIZATION     Moderate 3 vessel CAD by cath 05/2012  . CHOLECYSTECTOMY  02/2010  . JOINT REPLACEMENT  01/2010   L TKR - allusio  .  LEFT HEART CATHETERIZATION WITH CORONARY ANGIOGRAM N/A 05/22/2012   Procedure: LEFT HEART CATHETERIZATION WITH CORONARY ANGIOGRAM;  Surgeon: Sinclair Grooms, MD;  Location: [facility] CATH LAB;  Service: Cardiovascular;  Laterality: N/A;  . LEFT HEART CATHETERIZATION WITH CORONARY ANGIOGRAM N/A 02/20/2014   Procedure: LEFT HEART CATHETERIZATION WITH CORONARY ANGIOGRAM;  Surgeon: Peter M Martinique, MD;  Location: [facility] CATH LAB;  Service: Cardiovascular;  Laterality: N/A;  . TUBAL LIGATION       OB History   None      Home Medications    Prior to Admission medications   Medication Sig Start Date End Date Taking? Authorizing Provider  acetaminophen (TYLENOL) 500 MG tablet Take 500 mg by mouth every 6 (six) hours as needed for moderate pain.    [provider]  aspirin 81 MG tablet Take 81 mg by mouth daily.      [provider]  carvedilol (COREG CR) 40 MG 24 hr capsule TAKE 1 CAPSULE EVERY EVENING 04/13/18   Susy Frizzle, MD  clindamycin (CLINDAGEL) 1 % gel Apply 1 application topically daily.    [provider]  clopidogrel (PLAVIX) 75 MG tablet TAKE 1 TABLET DAILY 04/03/18   Susy Frizzle, MD  famotidine (PEPCID) 20 MG tablet Take 1 tablet (20 mg total) by mouth 2 (two) times daily. 04/03/18   Kennith Gain, MD  fexofenadine (ALLEGRA) 180 MG tablet Take 1 tablet (180 mg total) by mouth 2 (two) times daily. 04/04/18   Kennith Gain, MD  fluticasone (FLONASE) 50 MCG/ACT nasal spray Place 2 sprays into both nostrils daily. 11/11/16   Susy Frizzle, MD  hydrochlorothiazide (MICROZIDE) 12.5 MG capsule TAKE 1 CAPSULE DAILY Patient taking differently: TAKE 1 CAPSULE QOD 11/30/16   Susy Frizzle, MD  isosorbide mononitrate (IMDUR) 60 MG 24 hr tablet Take 1 tablet (60 mg total) by mouth daily. 04/13/18   Susy Frizzle, MD  Multiple Vitamins-Minerals (CENTRUM SILVER ADULT 50+ PO) Take 1 tablet by mouth daily.    [provider]    nitroGLYCERIN (NITROSTAT) 0.4 MG SL tablet DISSOLVE 1 TABLET UNDER THE TONGUE EVERY 5 MINUTES AS NEEDED FOR CHEST PAIN 01/27/17   Susy Frizzle, MD  predniSONE (DELTASONE) 20 MG tablet Take 20 mg by mouth once as needed (Lip swelling).    [provider]  rosuvastatin (CRESTOR) 20 MG tablet Take 1 tablet (20 mg total) by mouth at bedtime. 04/28/18   Susy Frizzle, MD  SYNTHROID 50 MCG tablet TAKE 1 TABLET ALTERNATING WITH ONE AND ONE-HALF TABLETS AS DIRECTED 01/18/18   Elayne Snare, MD    Family History Family History  Problem Relation Age of Onset  . Allergies Father   . Cancer Father   . Coronary artery disease Mother 1  . Heart disease Mother   . CAD Sister 83  . Cancer Sister   . AAA (abdominal aortic aneurysm) Sister   .  Hypertension Sister   . Hyperlipidemia Sister   . Diabetes Sister   . Heart disease Sister   . Diabetes Maternal Aunt     Social History Social History   Tobacco Use  . Smoking status: Never Smoker  . Smokeless tobacco: Never Used  Substance Use Topics  . Alcohol use: No    Alcohol/week: 0.0 oz  . Drug use: No     Allergies   Other; Adhesive [tape]; Codeine; Bactrim [sulfamethoxazole-trimethoprim]; Cephalexin; Lisinopril; Macrobid [nitrofurantoin monohyd macro]; and Tamiflu [oseltamivir phosphate]   Review of Systems Review of Systems  All other systems reviewed and are negative.    Physical Exam Updated Vital Signs BP (!) 154/83 (BP Location: Left Arm)   Pulse 74   Temp 98.4 F (36.9 C) (Oral)   Resp 20   Ht 5\' 7"  (1.702 m)   Wt 76.2 kg (168 lb)   SpO2 98%   BMI 26.31 kg/m   Physical Exam  Constitutional: She is oriented to person, place, and time. She appears well-developed and well-nourished.  Non-toxic appearance. No distress.  HENT:  Head: Normocephalic and atraumatic.  Nose: Nose normal.  Mouth/Throat: Oropharynx is clear and moist.  Eyes: Pupils are equal, round, and reactive to light. Conjunctivae are normal.  Right eye exhibits no discharge. Left eye exhibits no discharge.  Neck: Normal range of motion. Neck supple. No JVD present. No tracheal deviation present.  Cardiovascular: Normal rate, regular rhythm, normal heart sounds and intact distal pulses. Exam reveals no gallop and no friction rub.  No murmur heard. Pulmonary/Chest: Effort normal and breath sounds normal. No stridor. No respiratory distress. She has no decreased breath sounds. She has no wheezes. She has no rhonchi. She has no rales. She exhibits no tenderness.  No hypoxia or tachypnea.  Abdominal: Soft. Bowel sounds are normal. She exhibits no distension. There is no tenderness. There is no rebound and no guarding.  Musculoskeletal: Normal range of motion.       Right lower leg: Normal.       Left lower leg: Normal.  No lower extremity edema or calf tenderness.  Lymphadenopathy:    She has no cervical adenopathy.  Neurological: She is alert and oriented to person, place, and time.  The patient is alert, attentive, and oriented x 3. Speech is clear. Cranial nerve II-VII grossly intact. Negative pronator drift. Sensation intact. Strength 5/5 in all extremities. Reflexes 2+ and symmetric at biceps, triceps, knees, and ankles. Rapid alternating movement and fine finger movements intact.    Skin: Skin is warm and dry. Capillary refill takes less than 2 seconds. She is not diaphoretic.  Psychiatric: Her behavior is normal. Judgment and thought content normal.  Nursing note and vitals reviewed.    ED Treatments / Results  Labs (all labs ordered are listed, but only abnormal results are displayed) Labs Reviewed  BASIC METABOLIC PANEL - Abnormal; Notable for the following components:      Result Value   Potassium 3.4 (*)    Glucose, Bld 113 (*)    All other components within normal limits  CBC - Abnormal; Notable for the following components:   RBC 3.71 (*)    All other components within normal limits  I-STAT TROPONIN, ED     EKG EKG Interpretation  Date/Time:  Tuesday May 02 2018 22:30:53 EDT Ventricular Rate:  75 PR Interval:    QRS Duration: 90 QT Interval:  371 QTC Calculation: 415 R Axis:   37 Text Interpretation:  Sinus rhythm  Low voltage, precordial leads Baseline wander in lead(s) III Confirmed by Veryl Speak [phone]) on 05/02/2018 11:39:13 PM   Radiology Dg Chest 2 View  Result Date: 05/02/2018 CLINICAL DATA:  Chest pressure EXAM: CHEST - 2 VIEW COMPARISON:  May 22, 2012 FINDINGS: The heart size and mediastinal contours are within normal limits. Both lungs are clear. The visualized skeletal structures are unremarkable. IMPRESSION: No active cardiopulmonary disease. Electronically Signed   By: Dorise Bullion III M.D   On: 05/02/2018 23:00    Procedures Procedures (including critical care time)  Medications Ordered in ED Medications - No data to display   Initial Impression / Assessment and Plan / ED Course  I have reviewed the triage vital signs and the nursing notes.  Pertinent labs & imaging results that were available during my care of the patient were reviewed by me and considered in my medical decision making (see chart for details).     Patient presents to the ED with a complaint of neck fullness, chest pressure, shortness of breath that feels consistent with patient's prior MIs.  Patient has known coronary disease with last catheterization in 2015.  Patient has been medically managed at this time.  Patient is overall well-appearing at this time.  Vital signs are very reassuring.  Heart regular rate and rhythm.  Lungs clear to auscultation bilaterally.  No focal neuro deficit on exam.  Neurovascularly intact in all extremities.  EKG shows normal sinus rhythm and wandering baseline but no other acute ischemic changes.  Negative initial troponin.  Labs overall reassuring.  No leukocytosis.  Normal kidney function.  No significant elect light derangement.  EKG shows no signs of  pulmonary edema or infiltrate.  Clinical presentation does not seem consistent with dissection, pneumonia, PE. Given patient's prior history of MI and with presentation of symptoms concern for possible ACS.  Will consult cardiology.  I discussed with cardiology who saw patient and they recommend hospital admission and they will see patient in consultation.  They recommend patient being n.p.o. for possible stress test or cardiac catheterization this morning.  Will trend troponin.  Clinical presentation not seem consistent with CVA, SAH, ICH.  Patient remains hemodynamically with time.  Is updated on plan of care.  Discussed my attending who is agreed with the above plan. Spoke with Dr. Earlean Polka with hospital medicine who agrees to admission and will see pt in the Ed and place admission orders.   Final Clinical Impressions(s) / ED Diagnoses   Final diagnoses:  Atypical chest pain  Neck pain    ED Discharge Orders    None       Aaron Edelman 05/03/18 0116    Doristine Devoid, PA-C 05/03/18 0123    Veryl Speak, MD 05/03/18 [phone]

## 2018-05-02 NOTE — ED Triage Notes (Signed)
Per [facility], Pt reports chest pressure that started tonight and radiated to her neck. Pt reports she also had palpitations and felt near-syncopal. Pt reports all symptoms resolved except neck pressure. Pt has hx of 2 Mis in the past with similar symptoms. Pt received 324 ASA en route.

## 2018-05-02 NOTE — ED Notes (Signed)
Patient transported to X-ray 

## 2018-05-03 ENCOUNTER — Encounter (HOSPITAL_COMMUNITY): Payer: Self-pay | Admitting: Internal Medicine

## 2018-05-03 DIAGNOSIS — I2510 Atherosclerotic heart disease of native coronary artery without angina pectoris: Secondary | ICD-10-CM

## 2018-05-03 DIAGNOSIS — I10 Essential (primary) hypertension: Secondary | ICD-10-CM

## 2018-05-03 DIAGNOSIS — R0789 Other chest pain: Secondary | ICD-10-CM

## 2018-05-03 DIAGNOSIS — I200 Unstable angina: Secondary | ICD-10-CM | POA: Diagnosis not present

## 2018-05-03 DIAGNOSIS — R079 Chest pain, unspecified: Secondary | ICD-10-CM

## 2018-05-03 DIAGNOSIS — E039 Hypothyroidism, unspecified: Secondary | ICD-10-CM

## 2018-05-03 DIAGNOSIS — T783XXA Angioneurotic edema, initial encounter: Secondary | ICD-10-CM | POA: Insufficient documentation

## 2018-05-03 LAB — BASIC METABOLIC PANEL
Anion gap: 11 (ref 5–15)
BUN: 14 mg/dL (ref 6–20)
CALCIUM: 9.1 mg/dL (ref 8.9–10.3)
CO2: 26 mmol/L (ref 22–32)
Chloride: 104 mmol/L (ref 101–111)
Creatinine, Ser: 0.77 mg/dL (ref 0.44–1.00)
GFR calc Af Amer: >60 mL/min (ref >60)
GFR calc non Af Amer: >60 mL/min (ref >60)
GLUCOSE: 111 mg/dL — AB (ref 65–99)
Potassium: 3.5 mmol/L (ref 3.5–5.1)
Sodium: 141 mmol/L (ref 135–145)

## 2018-05-03 LAB — CBC WITH DIFFERENTIAL/PLATELET
ABS IMMATURE GRANULOCYTES: 0 10*3/uL (ref 0.0–0.1)
BASOS PCT: 1 %
Basophils Absolute: 0 10*3/uL (ref 0.0–0.1)
Eosinophils Absolute: 0.3 10*3/uL (ref 0.0–0.7)
Eosinophils Relative: 4 %
HEMATOCRIT: 37 % (ref 36.0–46.0)
Hemoglobin: 12.4 g/dL (ref 12.0–15.0)
Immature Granulocytes: 0 %
Lymphocytes Relative: 36 %
Lymphs Abs: 2.3 10*3/uL (ref 0.7–4.0)
MCH: 32.2 pg (ref 26.0–34.0)
MCHC: 33.5 g/dL (ref 30.0–36.0)
MCV: 96.1 fL (ref 78.0–100.0)
MONO ABS: 0.5 10*3/uL (ref 0.1–1.0)
MONOS PCT: 7 %
NEUTROS ABS: 3.4 10*3/uL (ref 1.7–7.7)
Neutrophils Relative %: 52 %
PLATELETS: 206 10*3/uL (ref 150–400)
RBC: 3.85 MIL/uL — ABNORMAL LOW (ref 3.87–5.11)
RDW: 12.6 % (ref 11.5–15.5)
WBC: 6.6 10*3/uL (ref 4.0–10.5)

## 2018-05-03 LAB — HEPATIC FUNCTION PANEL
ALK PHOS: 49 U/L (ref 38–126)
ALT: 18 U/L (ref 14–54)
AST: 24 U/L (ref 15–41)
Albumin: 3.5 g/dL (ref 3.5–5.0)
BILIRUBIN DIRECT: 0.1 mg/dL (ref 0.1–0.5)
BILIRUBIN INDIRECT: 0.8 mg/dL (ref 0.3–0.9)
Total Bilirubin: 0.9 mg/dL (ref 0.3–1.2)
Total Protein: 6.3 g/dL — ABNORMAL LOW (ref 6.5–8.1)

## 2018-05-03 LAB — CBG MONITORING, ED: GLUCOSE-CAPILLARY: 126 mg/dL — AB (ref 65–99)

## 2018-05-03 LAB — TSH: TSH: 2.893 u[IU]/mL (ref 0.350–4.500)

## 2018-05-03 LAB — TROPONIN I
Troponin I: <0.03 ng/mL (ref <0.03)
Troponin I: <0.03 ng/mL (ref <0.03)

## 2018-05-03 LAB — MAGNESIUM: Magnesium: 1.9 mg/dL (ref 1.7–2.4)

## 2018-05-03 MED ORDER — HYDRALAZINE HCL 20 MG/ML IJ SOLN: 5.0000 mg | INTRAMUSCULAR | Status: DC | PRN

## 2018-05-03 MED ORDER — ROSUVASTATIN CALCIUM 20 MG PO TABS: 20.0000 mg | ORAL_TABLET | Freq: Every day | ORAL | Status: DC

## 2018-05-03 MED ORDER — CARVEDILOL PHOSPHATE ER 40 MG PO CP24: 40.0000 mg | ORAL_CAPSULE | Freq: Every day | ORAL | Status: DC

## 2018-05-03 MED ORDER — LEVOTHYROXINE SODIUM 50 MCG PO TABS: 50.0000 ug | ORAL_TABLET | ORAL | Status: DC

## 2018-05-03 MED ORDER — NITROGLYCERIN 0.4 MG SL SUBL: 0.4000 mg | SUBLINGUAL_TABLET | SUBLINGUAL | Status: DC | PRN

## 2018-05-03 MED ORDER — ACETAMINOPHEN 325 MG PO TABS: 650.0000 mg | ORAL_TABLET | Freq: Four times a day (QID) | ORAL | Status: DC | PRN

## 2018-05-03 MED ORDER — LEVOTHYROXINE SODIUM 75 MCG PO TABS
75.0000 ug | ORAL_TABLET | ORAL | Status: DC
  Filled 2018-05-03: qty 1

## 2018-05-03 MED ORDER — ISOSORBIDE MONONITRATE ER 30 MG PO TB24: 60.0000 mg | ORAL_TABLET | Freq: Every day | ORAL | Status: DC

## 2018-05-03 MED ORDER — METHYLPREDNISOLONE SODIUM SUCC 40 MG IJ SOLR
20.0000 mg | Freq: Once | INTRAMUSCULAR | Status: AC
  Administered 2018-05-03: 20 mg via INTRAVENOUS
  Filled 2018-05-03: qty 1

## 2018-05-03 MED ORDER — CARVEDILOL PHOSPHATE ER 40 MG PO CP24
40.0000 mg | ORAL_CAPSULE | Freq: Every day | ORAL | Status: DC
  Administered 2018-05-03: 40 mg via ORAL
  Filled 2018-05-03: qty 1

## 2018-05-03 MED ORDER — CLOPIDOGREL BISULFATE 75 MG PO TABS: 75.0000 mg | ORAL_TABLET | Freq: Every day | ORAL | Status: DC

## 2018-05-03 MED ORDER — ASPIRIN EC 81 MG PO TBEC: 81.0000 mg | DELAYED_RELEASE_TABLET | Freq: Every day | ORAL | Status: DC

## 2018-05-03 MED ORDER — LEVOTHYROXINE SODIUM 50 MCG PO TABS: 50.0000 ug | ORAL_TABLET | Freq: Every day | ORAL | Status: DC

## 2018-05-03 MED ORDER — HYDROCHLOROTHIAZIDE 12.5 MG PO CAPS: ORAL_CAPSULE | ORAL | 3 refills | Status: DC

## 2018-05-03 MED ORDER — ENOXAPARIN SODIUM 40 MG/0.4ML ~~LOC~~ SOLN
40.0000 mg | Freq: Every day | SUBCUTANEOUS | Status: DC
  Filled 2018-05-03: qty 0.4

## 2018-05-03 MED ORDER — ACETAMINOPHEN 650 MG RE SUPP: 650.0000 mg | Freq: Four times a day (QID) | RECTAL | Status: DC | PRN

## 2018-05-03 NOTE — H&P (Signed)
History and Physical    Lindsey Erickson:[id number] DOB: 01-29-1932 DOA: 05/02/2018  PCP: Susy Frizzle, MD  Patient coming from: Home.  Chief Complaint: Chest pain.  HPI: Lindsey Erickson is a 84 y.o. female with history of CAD, hypertension, hypothyroidism who has recently been experiencing multiple episodes of angioedema attributed to possibly lisinopril which was discontinued in October last 2018 presents to the ER after patient started developing pressure around her neck with some chest pain.  Patient symptoms started last night around 8 PM.  Day before yesterday patient had some swelling of the lip typical of the angioedema for which she had taken prednisone Pepcid and antiallergy medication.  Patient states she still feels that her lower lip is mildly swollen.  Denies any difficulty talking swallowing and has no tongue swelling or rash.  The patient felt neck was having pressure just like the one when she had her non-ST elevation MI a few years ago.  Briefly she also had palpitation denies any shortness of breath productive cough fever or chills.  Patient states her blood pressure was recorded very high around 440 systolic at home.  ED Course: In the ER EKG was showing normal sinus rhythm.  Troponin was negative chest x-ray unremarkable.  Cardiologist on-call was consulted and admitted for further management of chest pain concerning for angina.  Review of Systems: As per HPI, rest all negative.   Past Medical History:  Diagnosis Date  . Anemia   . Arthritis   . Blood transfusion without reported diagnosis 1957  . CAD (coronary artery disease)    a. NSTEMI 2004 with thrombus in Cx and mod RCA. b. Nonischemic nuc 2008. c. mild NSTEMI 01/2013 (trop 0.42) -> cath with unchanged mod LAD & RCA disease, for continued medical therapy.  . Cancer ([place])   . Cataract   . H/O Hashimoto thyroiditis   . Hyperlipidemia   . HYPERTENSION    a. Also has h/o intermittentl low BP.  Marland Kitchen  Hypothyroidism   . Kidney stones   . Myocardial infarction ([place])   . Osteoporosis    no fractures  . Prediabetes   . QT prolongation    a. Prior EKGs QTc 474, but on 4/1 was 524 for unclear reason.  . Recurrent urinary tract infection   . Squamous cell carcinoma of skin 04/17/2015    Past Surgical History:  Procedure Laterality Date  . APPENDECTOMY  1999  . Bilateral Arthroscopies of knees    . CARDIAC CATHETERIZATION     Moderate 3 vessel CAD by cath 05/2012  . CHOLECYSTECTOMY  02/2010  . JOINT REPLACEMENT  01/2010   L TKR - allusio  . LEFT HEART CATHETERIZATION WITH CORONARY ANGIOGRAM N/A 05/22/2012   Procedure: LEFT HEART CATHETERIZATION WITH CORONARY ANGIOGRAM;  Surgeon: Sinclair Grooms, MD;  Location: [facility] CATH LAB;  Service: Cardiovascular;  Laterality: N/A;  . LEFT HEART CATHETERIZATION WITH CORONARY ANGIOGRAM N/A 02/20/2014   Procedure: LEFT HEART CATHETERIZATION WITH CORONARY ANGIOGRAM;  Surgeon: Peter M Martinique, MD;  Location: [facility] CATH LAB;  Service: Cardiovascular;  Laterality: N/A;  . TUBAL LIGATION       reports that she has never smoked. She has never used smokeless tobacco. She reports that she does not drink alcohol or use drugs.  Allergies  Allergen Reactions  . 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   ALL NARCOTICS- NAUSEA AND  VOMITING   . Adhesive [Tape] Itching, Swelling, Rash and Other (See Comments)    Also pain at location  . Codeine Nausea And Vomiting  . Bactrim [Sulfamethoxazole-Trimethoprim]     Muscle aches  . Cephalexin Hives  . Lisinopril Swelling  . Macrobid [Nitrofurantoin Monohyd Macro] Nausea And Vomiting  . Tamiflu [Oseltamivir Phosphate]     Family History  Problem Relation Age of Onset  . Allergies Father   . Cancer Father   . Coronary artery disease Mother 42  . Heart disease Mother   . CAD Sister 48  . Cancer Sister   . AAA (abdominal aortic  aneurysm) Sister   . Hypertension Sister   . Hyperlipidemia Sister   . Diabetes Sister   . Heart disease Sister   . Diabetes Maternal Aunt     Prior to Admission medications   Medication Sig Start Date End Date Taking? Authorizing Provider  acetaminophen (TYLENOL) 500 MG tablet Take 500 mg by mouth every 6 (six) hours as needed for moderate pain.   Yes [provider]  aspirin 81 MG tablet Take 81 mg by mouth daily.     Yes [provider]  carvedilol (COREG CR) 40 MG 24 hr capsule TAKE 1 CAPSULE EVERY EVENING 04/13/18  Yes Susy Frizzle, MD  clindamycin (CLINDAGEL) 1 % gel Apply 1 application topically daily as needed (infection).    Yes [provider]  clopidogrel (PLAVIX) 75 MG tablet TAKE 1 TABLET DAILY 04/03/18  Yes Susy Frizzle, MD  famotidine (PEPCID) 20 MG tablet Take 1 tablet (20 mg total) by mouth 2 (two) times daily. 04/03/18  Yes Padgett, Rae Halsted, MD  fexofenadine (ALLEGRA) 180 MG tablet Take 1 tablet (180 mg total) by mouth 2 (two) times daily. 04/04/18  Yes Padgett, Rae Halsted, MD  fluticasone [facility]) 50 MCG/ACT nasal spray Place 2 sprays into both nostrils daily. Patient taking differently: Place 2 sprays into both nostrils daily as needed for allergies.  11/11/16  Yes Susy Frizzle, MD  hydrochlorothiazide (MICROZIDE) 12.5 MG capsule TAKE 1 CAPSULE DAILY Patient taking differently: TAKE 1 CAPSULE THREE TIMES WEEKLY ON MONDAY, [facility] AND FRIDAY 11/30/16  Yes Susy Frizzle, MD  isosorbide mononitrate (IMDUR) 60 MG 24 hr tablet Take 1 tablet (60 mg total) by mouth daily. 04/13/18  Yes Susy Frizzle, MD  Multiple Vitamins-Minerals (CENTRUM SILVER ADULT 50+ PO) Take 1 tablet by mouth daily.   Yes [provider]  nitroGLYCERIN (NITROSTAT) 0.4 MG SL tablet DISSOLVE 1 TABLET UNDER THE TONGUE EVERY 5 MINUTES AS NEEDED FOR CHEST PAIN 01/27/17  Yes Susy Frizzle, MD  predniSONE (DELTASONE) 20 MG tablet Take 20 mg  by mouth daily as needed (Lip swelling).    Yes [provider]  rosuvastatin (CRESTOR) 20 MG tablet Take 1 tablet (20 mg total) by mouth at bedtime. 04/28/18  Yes Susy Frizzle, MD  SYNTHROID 50 MCG tablet TAKE 1 TABLET ALTERNATING WITH ONE AND ONE-HALF TABLETS AS DIRECTED Patient taking differently: Take 50-75 mcg by mouth daily. Take 50 mcg everyday except Mon, wed, and Fri take 75 mcg. 01/18/18  Yes Elayne Snare, MD    Physical Exam: Vitals:   05/02/18 2227 05/02/18 2233 05/03/18 0115  BP: (!) 154/83  (!) 154/78  Pulse: 74  78  Resp: 20  17  Temp: 98.4 F (36.9 C)    TempSrc: Oral    SpO2: 98%  98%  Weight:  76.2 kg (168 lb)   Height:  5\' 7"  (1.702 m)       Constitutional: Moderately built and nourished. Vitals:   05/02/18 2227 05/02/18 2233 05/03/18 0115  BP: (!) 154/83  (!) 154/78  Pulse: 74  78  Resp: 20  17  Temp: 98.4 F (36.9 C)    TempSrc: Oral    SpO2: 98%  98%  Weight:  76.2 kg (168 lb)   Height:  5\' 7"  (1.702 m)    Eyes: Anicteric no pallor. ENMT: No discharge from the ears eyes nose or mouth. Neck: No mass felt.  No JVD appreciated. Respiratory: No rhonchi or crepitations. Cardiovascular: S1-S2 heard no murmurs appreciated. Abdomen: Soft nontender bowel sounds present. Musculoskeletal: No edema. Skin: No rash. Neurologic: Alert awake oriented to time place and person.  Moves all extremities. Psychiatric: Appears normal per normal affect.   Labs on Admission: I have personally reviewed following labs and imaging studies  CBC: Recent Labs  Lab 05/02/18 2314  WBC 6.6  HGB 12.2  HCT 36.3  MCV 97.8  PLT 397   Basic Metabolic Panel: Recent Labs  Lab 05/02/18 2314  NA 139  K 3.4*  CL 105  CO2 25  GLUCOSE 113*  BUN 14  CREATININE 0.86  CALCIUM 8.9   GFR: Estimated Creatinine Clearance: 52.7 mL/min (by C-G formula based on SCr of 0.86 mg/dL). Liver Function Tests: No results for input(s): AST, ALT, ALKPHOS, BILITOT, PROT,  ALBUMIN in the last 168 hours. No results for input(s): LIPASE, AMYLASE in the last 168 hours. No results for input(s): AMMONIA in the last 168 hours. Coagulation Profile: No results for input(s): INR, PROTIME in the last 168 hours. Cardiac Enzymes: No results for input(s): CKTOTAL, CKMB, CKMBINDEX, TROPONINI in the last 168 hours. BNP (last 3 results) No results for input(s): PROBNP in the last 8760 hours. HbA1C: No results for input(s): HGBA1C in the last 72 hours. CBG: No results for input(s): GLUCAP in the last 168 hours. Lipid Profile: No results for input(s): CHOL, HDL, LDLCALC, TRIG, CHOLHDL, LDLDIRECT in the last 72 hours. Thyroid Function Tests: No results for input(s): TSH, T4TOTAL, FREET4, T3FREE, THYROIDAB in the last 72 hours. Anemia Panel: No results for input(s): VITAMINB12, FOLATE, FERRITIN, TIBC, IRON, RETICCTPCT in the last 72 hours. Urine analysis:    Component Value Date/Time   COLORURINE YELLOW 04/10/2018 1503   APPEARANCEUR CLOUDY (A) 04/10/2018 1503   LABSPEC 1.010 04/10/2018 1503   PHURINE 7.0 04/10/2018 1503   GLUCOSEU NEGATIVE 04/10/2018 1503   GLUCOSEU NEGATIVE 02/17/2015 1216   HGBUR NEGATIVE 04/10/2018 1503   BILIRUBINUR NEGATIVE 07/05/2017 1210   BILIRUBINUR neg 12/19/2013 1628   KETONESUR NEGATIVE 04/10/2018 1503   PROTEINUR NEGATIVE 04/10/2018 1503   UROBILINOGEN 0.2 02/17/2015 1216   NITRITE NEGATIVE 04/10/2018 1503   LEUKOCYTESUR 3+ (A) 04/10/2018 1503   Sepsis Labs: @LABRCNTIP (procalcitonin:4,lacticidven:4) )No results found for this or any previous visit (from the past 240 hour(s)).   Radiological Exams on Admission: Dg Chest 2 View  Result Date: 05/02/2018 CLINICAL DATA:  Chest pressure EXAM: CHEST - 2 VIEW COMPARISON:  May 22, 2012 FINDINGS: The heart size and mediastinal contours are within normal limits. Both lungs are clear. The visualized skeletal structures are unremarkable. IMPRESSION: No active cardiopulmonary disease.  Electronically Signed   By: Dorise Bullion III M.D   On: 05/02/2018 23:00    EKG: Independently reviewed.  Normal sinus rhythm.  Assessment/Plan Principal Problem:   Chest pain Active Problems:   Essential hypertension   Hypothyroid   CAD (coronary  artery disease)   Hyperlipidemia    1. Chest pain/neck pressure -concerning for angina.  Appreciate cardiology consult.  Continue antiplatelet agents Coreg statins.  Cycle cardiac markers check 2D echo.  Patient is kept n.p.o. in anticipation of possible cardiac procedure. 2. Recurrent angioedema -has had swelling of the lips 2 days ago.  Had taken prednisone yesterday and patient states she still feels that her lip is mildly swollen.  On exam patient does not have any tongue swelling or rash.  No difficulty to breathe or swallow.  No stridor or wheezing.  I have ordered 1 dose of Solu-Medrol. 3. Uncontrolled hypertension -closely follow blood pressure trends.  Patient is on Coreg Imdur and hydrochlorothiazide.  PRN IV hydralazine. 4. Hypothyroidism on Synthroid. 5. Hyperlipidemia on statins. 6. History of prediabetes.   DVT prophylaxis: Lovenox. Code Status: Full code. Family Communication: Patient's daughter. Disposition Plan: Home. Consults called: Cardiology. Admission status: Observation.   Rise Patience MD [facility] Pager [phone].  If 7PM-7AM, please contact night-coverage [email or web address] Password [facility]  05/03/2018, 2:31 AM

## 2018-05-03 NOTE — ED Notes (Signed)
Pt.perfer the nurse to draw her blood from IV

## 2018-05-03 NOTE — Consult Note (Addendum)
Cardiology Consult    Patient ID: Lindsey Erickson MRN: [id number], DOB/AGE: October 27, 1932   Admit date: 05/02/2018 Date of Consult: 05/03/2018  Primary Physician: Susy Frizzle, MD Primary Cardiologist: No primary care provider on file. Requesting Provider: Jackalyn Lombard, MD  Patient Profile    Lindsey Erickson is a 84 y.o. female with a history of CAD, who is being seen today for the evaluation of chest pain.  Past Medical History   Past Medical History:  Diagnosis Date  . Anemia   . Arthritis   . Blood transfusion without reported diagnosis 1957  . CAD (coronary artery disease)    a. NSTEMI 2004 with thrombus in Cx and mod RCA. b. Nonischemic nuc 2008. c. mild NSTEMI 01/2013 (trop 0.42) -> cath with unchanged mod LAD & RCA disease, for continued medical therapy.  . Cancer ([employer])   . Cataract   . H/O Hashimoto thyroiditis   . Hyperlipidemia   . HYPERTENSION    a. Also has h/o intermittentl low BP.  Marland Kitchen Hypothyroidism   . Kidney stones   . Myocardial infarction ([place])   . Osteoporosis    no fractures  . Prediabetes   . QT prolongation    a. Prior EKGs QTc 474, but on 4/1 was 524 for unclear reason.  . Recurrent urinary tract infection   . Squamous cell carcinoma of skin 04/17/2015    Past Surgical History:  Procedure Laterality Date  . APPENDECTOMY  1999  . Bilateral Arthroscopies of knees    . CARDIAC CATHETERIZATION     Moderate 3 vessel CAD by cath 05/2012  . CHOLECYSTECTOMY  02/2010  . JOINT REPLACEMENT  01/2010   L TKR - allusio  . LEFT HEART CATHETERIZATION WITH CORONARY ANGIOGRAM N/A 05/22/2012   Procedure: LEFT HEART CATHETERIZATION WITH CORONARY ANGIOGRAM;  Surgeon: Sinclair Grooms, MD;  Location: [facility] CATH LAB;  Service: Cardiovascular;  Laterality: N/A;  . LEFT HEART CATHETERIZATION WITH CORONARY ANGIOGRAM N/A 02/20/2014   Procedure: LEFT HEART CATHETERIZATION WITH CORONARY ANGIOGRAM;  Surgeon: Peter M Martinique, MD;  Location: [facility] CATH LAB;  Service: Cardiovascular;   Laterality: N/A;  . TUBAL LIGATION       Allergies  Allergies  Allergen Reactions  . 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   ALL NARCOTICS- NAUSEA AND VOMITING   . Adhesive [Tape] Itching, Swelling, Rash and Other (See Comments)    Also pain at location  . Codeine Nausea And Vomiting  . Bactrim [Sulfamethoxazole-Trimethoprim]     Muscle aches  . Cephalexin Hives  . Lisinopril Swelling  . Macrobid [Nitrofurantoin Monohyd Macro] Nausea And Vomiting  . Tamiflu [Oseltamivir Phosphate]     History of Present Illness    48 y o woman with PMH of CAD. Last NSTEMI in 2006 and 2015. Repeat LHC with mutiple lesions, but not warranting a stent. coronary catheterization by Dr. Irish Lack 02/20/14 notable for 70% mid RCA lesion, 56% mid circumflex lesion with FFR interrogation suggesting nonsignificance. Medical therapy was recommended.   She now presents with recurrent CP. Started today while in a recliner. Pain radiated to neck. Also with palpitations. No presyncope, syncope, SOB or PND. NO changes in meds recently. Under some stress recently due to an upcoming skin cancer removal on her leg.   She took 4 baby asa without pain relieve. BP was 190's when EMS arrived at her home. BP usually normal.  Inpatient Medications    None    Family History    Family History  Problem Relation Age of Onset  . Allergies Father   . Cancer Father   . Coronary artery disease Mother 63  . Heart disease Mother   . CAD Sister 60  . Cancer Sister   . AAA (abdominal aortic aneurysm) Sister   . Hypertension Sister   . Hyperlipidemia Sister   . Diabetes Sister   . Heart disease Sister   . Diabetes Maternal Aunt    indicated that her mother is deceased. She indicated that her father is deceased. She indicated that only one of her two sisters is alive. She indicated that her brother is deceased. She indicated that  the status of her maternal aunt is unknown.   Social History    Social History   Socioeconomic History  . Marital status: Married    Spouse name: Not on file  . Number of children: Not on file  . Years of education: Not on file  . Highest education level: Not on file  Occupational History  . Not on file  Social Needs  . Financial resource strain: Not on file  . Food insecurity:    Worry: Not on file    Inability: Not on file  . Transportation needs:    Medical: Not on file    Non-medical: Not on file  Tobacco Use  . Smoking status: Never Smoker  . Smokeless tobacco: Never Used  Substance and Sexual Activity  . Alcohol use: No    Alcohol/week: 0.0 oz  . Drug use: No  . Sexual activity: Not Currently  Lifestyle  . Physical activity:    Days per week: Not on file    Minutes per session: Not on file  . Stress: Not on file  Relationships  . Social connections:    Talks on phone: Not on file    Gets together: Not on file    Attends religious service: Not on file    Active member of club or organization: Not on file    Attends meetings of clubs or organizations: Not on file    Relationship status: Not on file  . Intimate partner violence:    Fear of current or ex partner: Not on file    Emotionally abused: Not on file    Physically abused: Not on file    Forced sexual activity: Not on file  Other Topics Concern  . Not on file  Social History Narrative   Married, 2 children. Retired from [employer]:  No chills, fever, night sweats or weight changes.  Cardiovascular:  No chest pain, dyspnea on exertion, edema, orthopnea, palpitations, paroxysmal nocturnal dyspnea. Dermatological: No rash, lesions/masses Respiratory: No cough, dyspnea Urologic: No hematuria, dysuria Abdominal:   No nausea, vomiting, diarrhea, bright red blood per rectum, melena, or hematemesis Neurologic:  No visual changes, wkns, changes in mental status. All  other systems reviewed and are otherwise negative except as noted above.  Physical Exam    Blood pressure (!) 154/83, pulse 74, temperature 98.4 F (36.9 C), temperature source Oral, resp. rate 20, height 5\' 7"  (1.702 m), weight 76.2 kg (168 lb), SpO2 98 %.  General: Pleasant, NAD Psych: Normal affect. Neuro: Alert and oriented X 3. Moves all extremities spontaneously. HEENT: Normal  Neck: Supple without bruits or JVD. Lungs:  Resp regular and unlabored, CTA. Heart: RRR no s3, s4,  or murmurs. Abdomen: Soft, non-tender, non-distended, BS + x 4.  Extremities: No clubbing, cyanosis or edema. DP/PT/Radials 2+ and equal bilaterally.  Labs    Troponin [facility] of Care Test) Recent Labs    05/02/18 2318  TROPIPOC 0.01   No results for input(s): CKTOTAL, CKMB, TROPONINI in the last 72 hours. Lab Results  Component Value Date   WBC 6.6 05/02/2018   HGB 12.2 05/02/2018   HCT 36.3 05/02/2018   MCV 97.8 05/02/2018   PLT 195 05/02/2018    Recent Labs  Lab 05/02/18 2314  NA 139  K 3.4*  CL 105  CO2 25  BUN 14  CREATININE 0.86  CALCIUM 8.9  GLUCOSE 113*   Lab Results  Component Value Date   CHOL 160 04/11/2018   HDL 56 04/11/2018   LDLCALC 76 04/11/2018   TRIG 181 (H) 04/11/2018   No results found for: [facility]   Radiology Studies    Dg Chest 2 View  Result Date: 05/02/2018 CLINICAL DATA:  Chest pressure EXAM: CHEST - 2 VIEW COMPARISON:  May 22, 2012 FINDINGS: The heart size and mediastinal contours are within normal limits. Both lungs are clear. The visualized skeletal structures are unremarkable. IMPRESSION: No active cardiopulmonary disease. Electronically Signed   By: Dorise Bullion III M.D   On: 05/02/2018 23:00   US Thyroid  Result Date: 04/25/2018 CLINICAL DATA:  Goiter. 84 year old female with palpable right-sided thyroid gland. On chronic thyroid hormone replacement therapy. EXAM: THYROID ULTRASOUND TECHNIQUE: Ultrasound examination of the thyroid gland and  adjacent soft tissues was performed. COMPARISON:  None. FINDINGS: Parenchymal Echotexture: Mildly heterogenous Isthmus: 0.4 cm Right lobe: 7.7 x 3.3 x 3.9 cm Left lobe: 3.9 x 1.1 x 1.1 cm _________________________________________________________ Estimated total number of nodules >/= 1 cm: 1 Number of spongiform nodules >/=  2 cm not described below (TR1): 0 Number of mixed cystic and solid nodules >/= 1.5 cm not described below (TR2): 0 _________________________________________________________ Nodule # 1: Location: Right; Mid Maximum size: 5.8 cm; Other 2 dimensions: 4.0 x 5.5 cm Composition: solid/almost completely solid (2) Echogenicity: isoechoic (1) Shape: not taller-than-wide (0) Margins: ill-defined (0) Echogenic foci: macrocalcifications (1) ACR TI-RADS total points: 4. ACR TI-RADS risk category: TR4 (4-6 points). ACR TI-RADS recommendations: **Given size (>/= 1.5 cm) and appearance, fine needle aspiration of this moderately suspicious nodule should be considered based on TI-RADS criteria. _________________________________________________________ IMPRESSION: Large TI-RADS category 4 nodule measuring up to 5.8 cm in occupying the majority of the right gland meets consensus criteria for consideration of fine-needle aspiration biopsy. The above is in keeping with the ACR TI-RADS recommendations - J Am Coll Radiol 2017;14:587-595. Electronically Signed   By: Jacqulynn Cadet M.D.   On: 04/25/2018 11:47    ECG & Cardiac Imaging    NSR, no acute ischemic changes  Assessment & Plan    CP: Unstable angina. Known CAD. Suspect progression of CAD. Trop and ECG wnl. Was told she might need CABG if symptoms recur   Plan: - Admit to medicine - NPO after MN for possible cath or nuclear stress. - ASA - No need for Heparin - Control BP, continue on home meds incl BB   Signed, Cristina Gong, MD 05/03/2018, 12:02 AM  For questions or updates, please contact   Please consult [email or web address] for contact info  under Cardiology/STEMI.

## 2018-05-03 NOTE — Discharge Summary (Signed)
Physician Discharge Summary  Lindsey Erickson DOB: 1932-09-24 DOA: 05/02/2018  PCP: Susy Frizzle, MD  Admit date: 05/02/2018 Discharge date: 05/03/2018  Admitted From: home Discharge disposition: home   Recommendations for Outpatient Follow-Up:   1. Outpatient stress test   Discharge Diagnosis:   Principal Problem:   Atypical chest pain Active Problems:   Essential hypertension   Hypothyroid   CAD (coronary artery disease)   Hyperlipidemia    Discharge Condition: Improved.  Diet recommendation: Low sodium, heart healthy  Wound care: None.  Code status: Full.   History of Present Illness:   Lindsey Erickson is a 84 y.o. female with history of CAD, hypertension, hypothyroidism who has recently been experiencing multiple episodes of angioedema attributed to possibly lisinopril which was discontinued in October last 2018 presents to the ER after patient started developing pressure around her neck with some chest pain.  Patient symptoms started last night around 8 PM.  Day before yesterday patient had some swelling of the lip typical of the angioedema for which she had taken prednisone Pepcid and antiallergy medication.  Patient states she still feels that her lower lip is mildly swollen.  Denies any difficulty talking swallowing and has no tongue swelling or rash.  The patient felt neck was having pressure just like the one when she had her non-ST elevation MI a few years ago.  Briefly she also had palpitation denies any shortness of breath productive cough fever or chills.  Patient states her blood pressure was recorded very high around 784 systolic at home.     Hospital Course by Problem:   Chest pain -Symptoms are more likely due to angioedema with predominant throat tightness and lip swelling, now improved. EKG benign. Troponin negative x 2. No further inpatient cardiac work-up recommended. Ok to d/c home from a cardiac standpoint. Will arrange  follow-up with Dr. [name] or APP in the office.  Angioedema -follows with allergy -not on ACE       Medical Consultants:   cards   Discharge Exam:   Vitals:   05/03/18 0615 05/03/18 0630  BP: (!) 146/89 (!) 153/77  Pulse: 79 77  Resp: 20 20  Temp:    SpO2: 96% 97%   Vitals:   05/03/18 0500 05/03/18 0600 05/03/18 0615 05/03/18 0630  BP: (!) 143/72 (!) 158/88 (!) 146/89 (!) 153/77  Pulse: 78 77 79 77  Resp: (!) 22 17 20 20   Temp:      TempSrc:      SpO2: 96% 93% 96% 97%  Weight:      Height:        General exam: Appears calm and comfortable. Anxious to go home    The results of significant diagnostics from this hospitalization (including imaging, microbiology, ancillary and laboratory) are listed below for reference.     Procedures and Diagnostic Studies:   Dg Chest 2 View  Result Date: 05/02/2018 CLINICAL DATA:  Chest pressure EXAM: CHEST - 2 VIEW COMPARISON:  May 22, 2012 FINDINGS: The heart size and mediastinal contours are within normal limits. Both lungs are clear. The visualized skeletal structures are unremarkable. IMPRESSION: No active cardiopulmonary disease. Electronically Signed   By: Dorise Bullion III M.D   On: 05/02/2018 23:00     Labs:   Basic Metabolic Panel: Recent Labs  Lab 05/02/18 2314 05/03/18 0257  NA 139 141  K 3.4* 3.5  CL 105 104  CO2 25 26  GLUCOSE 113* 111*  BUN 14 14  CREATININE 0.86 0.77  CALCIUM 8.9 9.1  MG  --  1.9   GFR Estimated Creatinine Clearance: 56.7 mL/min (by C-G formula based on SCr of 0.77 mg/dL). Liver Function Tests: Recent Labs  Lab 05/03/18 0257  AST 24  ALT 18  ALKPHOS 49  BILITOT 0.9  PROT 6.3*  ALBUMIN 3.5   No results for input(s): LIPASE, AMYLASE in the last 168 hours. No results for input(s): AMMONIA in the last 168 hours. Coagulation profile No results for input(s): INR, PROTIME in the last 168 hours.  CBC: Recent Labs  Lab 05/02/18 2314 05/03/18 0257  WBC 6.6 6.6  NEUTROABS   --  3.4  HGB 12.2 12.4  HCT 36.3 37.0  MCV 97.8 96.1  PLT 195 206   Cardiac Enzymes: Recent Labs  Lab 05/03/18 0257  TROPONINI <0.03   BNP: Invalid input(s): POCBNP CBG: Recent Labs  Lab 05/03/18 0828  GLUCAP 126*   D-Dimer No results for input(s): DDIMER in the last 72 hours. Hgb A1c No results for input(s): HGBA1C in the last 72 hours. Lipid Profile No results for input(s): CHOL, HDL, LDLCALC, TRIG, CHOLHDL, LDLDIRECT in the last 72 hours. Thyroid function studies Recent Labs    05/03/18 0257  TSH 2.893   Anemia work up No results for input(s): VITAMINB12, FOLATE, FERRITIN, TIBC, IRON, RETICCTPCT in the last 72 hours. Microbiology No results found for this or any previous visit (from the past 240 hour(s)).   Discharge Instructions:   Discharge Instructions    Diet - low sodium heart healthy   Complete by:  As directed    Discharge instructions   Complete by:  As directed    DR. Berry's office will call with follow up appointment Keep log of foods eaten when having lip swelling--- fruit and nuts especially   Increase activity slowly   Complete by:  As directed      Allergies as of 05/03/2018      Reactions   Other Other (See Comments)   Frozen Blood Plasma-caused patient to crash / cardiac arrest / CAN NEVER TAKE THIS AGAIN-PER MD'S.  ALL NARCOTICS- NAUSEA AND VOMITING    Adhesive [tape] Itching, Swelling, Rash, Other (See Comments)   Also pain at location   Codeine Nausea And Vomiting   Bactrim [sulfamethoxazole-trimethoprim]    Muscle aches   Cephalexin Hives   Lisinopril Swelling   Macrobid [nitrofurantoin Monohyd Macro] Nausea And Vomiting   Tamiflu [oseltamivir Phosphate]       Medication List    TAKE these medications   acetaminophen 500 MG tablet Commonly known as:  TYLENOL Take 500 mg by mouth every 6 (six) hours as needed for moderate pain.   aspirin 81 MG tablet Take 81 mg by mouth daily.   carvedilol 40 MG 24 hr  capsule Commonly known as:  COREG CR TAKE 1 CAPSULE EVERY EVENING   CENTRUM SILVER ADULT 50+ PO Take 1 tablet by mouth daily.   clindamycin 1 % gel Commonly known as:  CLINDAGEL Apply 1 application topically daily as needed (infection).   clopidogrel 75 MG tablet Commonly known as:  PLAVIX TAKE 1 TABLET DAILY   famotidine 20 MG tablet Commonly known as:  PEPCID Take 1 tablet (20 mg total) by mouth 2 (two) times daily.   fexofenadine 180 MG tablet Commonly known as:  ALLEGRA Take 1 tablet (180 mg total) by mouth 2 (two) times daily.   fluticasone 50 MCG/ACT nasal spray Commonly known as:  FLONASE Place 2 sprays into both nostrils daily. What changed:    when to take this  reasons to take this   hydrochlorothiazide 12.5 MG capsule Commonly known as:  MICROZIDE TAKE 1 CAPSULE THREE TIMES WEEKLY ON MONDAY, WEDNESDAY AND FRIDAY What changed:    how much to take  how to take this  when to take this  additional instructions   isosorbide mononitrate 60 MG 24 hr tablet Commonly known as:  IMDUR Take 1 tablet (60 mg total) by mouth daily.   nitroGLYCERIN 0.4 MG SL tablet Commonly known as:  NITROSTAT DISSOLVE 1 TABLET UNDER THE TONGUE EVERY 5 MINUTES AS NEEDED FOR CHEST PAIN   predniSONE 20 MG tablet Commonly known as:  DELTASONE Take 20 mg by mouth daily as needed (Lip swelling).   rosuvastatin 20 MG tablet Commonly known as:  CRESTOR Take 1 tablet (20 mg total) by mouth at bedtime.   SYNTHROID 50 MCG tablet Generic drug:  levothyroxine TAKE 1 TABLET ALTERNATING WITH ONE AND ONE-HALF TABLETS AS DIRECTED What changed:    how much to take  how to take this  when to take this  additional instructions      Follow-up Information    Susy Frizzle, MD Follow up in 1 week(s).   Specialty:  Family Medicine Contact information: [address] [place] [address] [phone]        Lorretta Harp, MD Follow up on 05/17/2018.    Specialties:  Cardiology, Radiology Why:  at 11:00AM with his PA Rosaria Ferries.  Contact information: [address] [place] [address] [phone]            Time coordinating discharge: 25 min  Signed:  Geradine Girt  [facility] 05/03/2018, 9:34 AM

## 2018-05-03 NOTE — Progress Notes (Signed)
 DAILY PROGRESS NOTE   Patient Name: Lindsey Erickson Date of Encounter: 05/03/2018  Chief Complaint   No further chest pain  Patient Profile   85 yo female with chest pain concerning for unstable angina. History of CAD with NSTEMI in 2004 and 2014. Last cath in 2015 demonstrated moderate 3 vessel disease with FFR negative disease in the RCA (was 60% stenosed).  Subjective   No further chest pain overnight. To me, she described recent angioedema (has a history of this). Last evening had lip swelling and tingling, throat tightness and upper chest tightness and shortness of breath - took allegra, pepcid and given prednisone. Symptoms took hours to resolve. Troponin negative x 2. EKG is non-ischemic. Has had no other recent progressive symptoms concerning for unstable angina.  Objective   Vitals:   05/03/18 0500 05/03/18 0600 05/03/18 0615 05/03/18 0630  BP: (!) 143/72 (!) 158/88 (!) 146/89 (!) 153/77  Pulse: 78 77 79 77  Resp: (!) 22 17 20 20  Temp:      TempSrc:      SpO2: 96% 93% 96% 97%  Weight:      Height:       No intake or output data in the 24 hours ending 05/03/18 0824 Filed Weights   05/02/18 2233  Weight: 168 lb (76.2 kg)    Physical Exam   General appearance: alert and no distress Neck: no carotid bruit, no JVD, thyroid not enlarged, symmetric, no tenderness/mass/nodules and no lip or tongue swelling Lungs: clear to auscultation bilaterally Heart: regular rate and rhythm Abdomen: soft, non-tender; bowel sounds normal; no masses,  no organomegaly Extremities: extremities normal, atraumatic, no cyanosis or edema Pulses: 2+ and symmetric Skin: Skin color, texture, turgor normal. No rashes or lesions Neurologic: Grossly normal Psych: Pleasant  Inpatient Medications    Scheduled Meds: . aspirin EC  81 mg Oral Daily  . carvedilol  40 mg Oral QHS  . clopidogrel  75 mg Oral Daily  . enoxaparin (LOVENOX) injection  40 mg Subcutaneous Daily  . isosorbide  mononitrate  60 mg Oral Daily  . levothyroxine  75 mcg Oral Q M,W,F   And  . [START ON 05/04/2018] levothyroxine  50 mcg Oral Q T,Th,S,Su  . rosuvastatin  20 mg Oral QHS    Continuous Infusions:   PRN Meds: acetaminophen **OR** acetaminophen, hydrALAZINE, nitroGLYCERIN   Labs   Results for orders placed or performed during the hospital encounter of 05/02/18 (from the past 48 hour(s))  Basic metabolic panel     Status: Abnormal   Collection Time: 05/02/18 11:14 PM  Result Value Ref Range   Sodium 139 135 - 145 mmol/L   Potassium 3.4 (L) 3.5 - 5.1 mmol/L   Chloride 105 101 - 111 mmol/L   CO2 25 22 - 32 mmol/L   Glucose, Bld 113 (H) 65 - 99 mg/dL   BUN 14 6 - 20 mg/dL   Creatinine, Ser 0.86 0.44 - 1.00 mg/dL   Calcium 8.9 8.9 - 10.3 mg/dL   GFR calc non Af Amer >60 >60 mL/min   GFR calc Af Amer >60 >60 mL/min    Comment: (NOTE) The eGFR has been calculated using the CKD EPI equation. This calculation has not been validated in all clinical situations. eGFR's persistently <60 mL/min signify possible Chronic Kidney Disease.    Anion gap 9 5 - 15    Comment: Performed at [facility], [address] [place], [place] [address]  CBC       Status: Abnormal   Collection Time: 05/02/18 11:14 PM  Result Value Ref Range   WBC 6.6 4.0 - 10.5 K/uL   RBC 3.71 (L) 3.87 - 5.11 MIL/uL   Hemoglobin 12.2 12.0 - 15.0 g/dL   HCT 36.3 36.0 - 46.0 %   MCV 97.8 78.0 - 100.0 fL   MCH 32.9 26.0 - 34.0 pg   MCHC 33.6 30.0 - 36.0 g/dL   RDW 12.7 11.5 - 15.5 %   Platelets 195 150 - 400 K/uL    Comment: Performed at [facility], [address] [place], [place] [address]  I-stat troponin, ED     Status: None   Collection Time: 05/02/18 11:18 PM  Result Value Ref Range   Troponin i, poc 0.01 0.00 - 0.08 ng/mL   Comment 3            Comment: Due to the release kinetics of cTnI, a negative result within the first hours of the onset of symptoms does not rule out myocardial infarction  with certainty. If myocardial infarction is still suspected, repeat the test at appropriate intervals.   Hepatic function panel     Status: Abnormal   Collection Time: 05/03/18  2:57 AM  Result Value Ref Range   Total Protein 6.3 (L) 6.5 - 8.1 g/dL   Albumin 3.5 3.5 - 5.0 g/dL   AST 24 15 - 41 U/L   ALT 18 14 - 54 U/L   Alkaline Phosphatase 49 38 - 126 U/L   Total Bilirubin 0.9 0.3 - 1.2 mg/dL   Bilirubin, Direct 0.1 0.1 - 0.5 mg/dL   Indirect Bilirubin 0.8 0.3 - 0.9 mg/dL    Comment: Performed at [facility], [address] [place], [place] [address]  Magnesium     Status: None   Collection Time: 05/03/18  2:57 AM  Result Value Ref Range   Magnesium 1.9 1.7 - 2.4 mg/dL    Comment: Performed at [facility], [address] [place], [place] [address]  Basic metabolic panel     Status: Abnormal   Collection Time: 05/03/18  2:57 AM  Result Value Ref Range   Sodium 141 135 - 145 mmol/L   Potassium 3.5 3.5 - 5.1 mmol/L   Chloride 104 101 - 111 mmol/L   CO2 26 22 - 32 mmol/L   Glucose, Bld 111 (H) 65 - 99 mg/dL   BUN 14 6 - 20 mg/dL   Creatinine, Ser 0.77 0.44 - 1.00 mg/dL   Calcium 9.1 8.9 - 10.3 mg/dL   GFR calc non Af Amer >60 >60 mL/min   GFR calc Af Amer >60 >60 mL/min    Comment: (NOTE) The eGFR has been calculated using the CKD EPI equation. This calculation has not been validated in all clinical situations. eGFR's persistently <60 mL/min signify possible Chronic Kidney Disease.    Anion gap 11 5 - 15    Comment: Performed at [facility], [address] [place], [place] [address]  CBC WITH DIFFERENTIAL     Status: Abnormal   Collection Time: 05/03/18  2:57 AM  Result Value Ref Range   WBC 6.6 4.0 - 10.5 K/uL   RBC 3.85 (L) 3.87 - 5.11 MIL/uL   Hemoglobin 12.4 12.0 - 15.0 g/dL   HCT 37.0 36.0 - 46.0 %   MCV 96.1 78.0 - 100.0 fL   MCH 32.2 26.0 - 34.0 pg   MCHC 33.5 30.0 - 36.0 g/dL     RDW 12.6 11.5 - 15.5 %   Platelets 206 150 - 400 K/uL    Neutrophils Relative % 52 %   Neutro Abs 3.4 1.7 - 7.7 K/uL   Lymphocytes Relative 36 %   Lymphs Abs 2.3 0.7 - 4.0 K/uL   Monocytes Relative 7 %   Monocytes Absolute 0.5 0.1 - 1.0 K/uL   Eosinophils Relative 4 %   Eosinophils Absolute 0.3 0.0 - 0.7 K/uL   Basophils Relative 1 %   Basophils Absolute 0.0 0.0 - 0.1 K/uL   Immature Granulocytes 0 %   Abs Immature Granulocytes 0.0 0.0 - 0.1 K/uL    Comment: Performed at [facility], [address] [place], [place] [address]  TSH     Status: None   Collection Time: 05/03/18  2:57 AM  Result Value Ref Range   TSH 2.893 0.350 - 4.500 uIU/mL    Comment: Performed by a 3rd Generation assay with a functional sensitivity of <=0.01 uIU/mL. Performed at [facility], [address] [place], [place] [address]   Troponin I     Status: None   Collection Time: 05/03/18  2:57 AM  Result Value Ref Range   Troponin I <0.03 <0.03 ng/mL    Comment: Performed at [facility], [address] [place],  [address]    ECG   NSR - Personally Reviewed  Telemetry   Sinus rhythm - Personally Reviewed  Radiology    Dg Chest 2 View  Result Date: 05/02/2018 CLINICAL DATA:  Chest pressure EXAM: CHEST - 2 VIEW COMPARISON:  May 22, 2012 FINDINGS: The heart size and mediastinal contours are within normal limits. Both lungs are clear. The visualized skeletal structures are unremarkable. IMPRESSION: No active cardiopulmonary disease. Electronically Signed   By: David  Williams III M.D   On: 05/02/2018 23:00    Cardiac Studies   N/A  Assessment   1. Principal Problem: 2.   Chest pain 3. Active Problems: 4.   Essential hypertension 5.   Hypothyroid 6.   CAD (coronary artery disease) 7.   Hyperlipidemia 8.   Plan   1. Symptoms are more likely due to angioedema with predominant throat tightness and lip swelling, now improved. EKG benign. Troponin negative x 2. No further inpatient cardiac work-up recommended. Ok to d/c home  from a cardiac standpoint. Will arrange follow-up with Dr. Berry or APP in the office.  Time Spent Directly with Patient:  I have spent a total of 15 minutes with the patient reviewing hospital notes, telemetry, EKGs, labs and examining the patient as well as establishing an assessment and plan that was discussed personally with the patient. > 50% of time was spent in direct patient care.  Length of Stay:  LOS: 0 days    C. , MD, FACC, FACP  [place]  [facility]  Medical Director of the [facility] Diplomate of the American Board of Clinical Lipidology Attending Cardiologist  Direct Dial: [phone]  Fax: [phone]  Website:  [email or web address]   C  05/03/2018, 8:24 AM   

## 2018-05-03 NOTE — ED Notes (Signed)
Pt ambulatory to bathroom

## 2018-05-17 ENCOUNTER — Ambulatory Visit (INDEPENDENT_AMBULATORY_CARE_PROVIDER_SITE_OTHER): Payer: Medicare Other | Admitting: Physician Assistant

## 2018-05-17 ENCOUNTER — Encounter: Payer: Self-pay | Admitting: Physician Assistant

## 2018-05-17 VITALS — BP 118/80 | HR 72 | Ht 67.0 in | Wt 166.4 lb

## 2018-05-17 DIAGNOSIS — I209 Angina pectoris, unspecified: Secondary | ICD-10-CM | POA: Diagnosis not present

## 2018-05-17 DIAGNOSIS — I10 Essential (primary) hypertension: Secondary | ICD-10-CM

## 2018-05-17 DIAGNOSIS — E7800 Pure hypercholesterolemia, unspecified: Secondary | ICD-10-CM

## 2018-05-17 MED ORDER — HYDROCHLOROTHIAZIDE 12.5 MG PO CAPS: 12.5000 mg | ORAL_CAPSULE | Freq: Every day | ORAL | 3 refills | Status: DC

## 2018-05-17 MED ORDER — NITROGLYCERIN 0.4 MG SL SUBL: SUBLINGUAL_TABLET | SUBLINGUAL | 4 refills | Status: DC

## 2018-05-17 NOTE — Progress Notes (Signed)
Cardiology Office Note   Date:  05/17/2018   ID:  Lindsey Erickson, DOB 27-Jun-1932, MRN [id number]  PCP:  Susy Frizzle, MD  Cardiologist: Dr. [name], 03/01/2018 Rosaria Ferries, PA-C   Chief Complaint  Patient presents with  . Hospitalization Follow-up    History of Present Illness: Lindsey Erickson is a 84 y.o. female with a history of NSTEMI 2004 w/ mod RCA dz and CFX thrombus, minor NSTEMI w/ cath 2015 w/ 70% mRCA & 50-60% mCFX>>med rx, HTN, HLD, hypothyroid  Date 6/11-6/10/2018 for chest pain, symptoms felt secondary to angioedema secondary to lisinopril, follow-up in office  Lindsey Erickson presents for cardiology follow up.  She feels she did not really have chest pain, it was more like a hot feeling, then a pressure feeling in her neck. This was associated with a small amount of chest pain.   She had angioedema remotely, has had 8 attacks since then. The allergist told her the angioedema could recur. She takes prn prednisone, daily Allegra and Pepcid. Each attack is less severe, she takes the prednisone immediately.   She does not do a lot because of her knees. Has had L TKR, R is bone-bone but she does not wish to have surgery. Feels she is too old, does not wish to go through the rehab. Previously did not have surgery because Dr [name] felt her heart condition was too severe.   She had 2 squamous cell skin cancers taken off her lower L leg. She has an Haematologist on, it was applied after the surgery to keep it from swelling and promote healing.   Her thyroid is enlarged, she has a goiter, has appt to get a biopsy.    Past Medical History:  Diagnosis Date  . Anemia   . Arthritis   . Blood transfusion without reported diagnosis 1957  . CAD (coronary artery disease)    a. NSTEMI 2004 with thrombus in Cx and mod RCA. b. Nonischemic nuc 2008. c. mild NSTEMI 01/2013 (trop 0.42) -> cath with unchanged mod LAD & RCA disease, for continued medical therapy.  . Cancer ([place])     . Cataract   . H/O Hashimoto thyroiditis   . Hyperlipidemia   . HYPERTENSION    a. Also has h/o intermittentl low BP.  Marland Kitchen Hypothyroidism   . Kidney stones   . Myocardial infarction ([place])   . Osteoporosis    no fractures  . Prediabetes   . QT prolongation    a. Prior EKGs QTc 474, but on 4/1 was 524 for unclear reason.  . Recurrent urinary tract infection   . Squamous cell carcinoma of skin 04/17/2015    Past Surgical History:  Procedure Laterality Date  . APPENDECTOMY  1999  . Bilateral Arthroscopies of knees    . CARDIAC CATHETERIZATION     Moderate 3 vessel CAD by cath 05/2012  . CHOLECYSTECTOMY  02/2010  . JOINT REPLACEMENT  01/2010   L TKR - allusio  . LEFT HEART CATHETERIZATION WITH CORONARY ANGIOGRAM N/A 05/22/2012   Procedure: LEFT HEART CATHETERIZATION WITH CORONARY ANGIOGRAM;  Surgeon: Sinclair Grooms, MD;  [facility] C[facility] LAB;  Service: Cardiovascular;  Laterality: N/A;  . LEFT HEART CATHETERIZATION WITH CORONARY ANGIOGRAM N/A 02/20/2014   Procedure: LEFT HEART CATHETERIZATION WITH CORONARY ANGIOGRAM;  Surgeon: Peter M Martinique, MD;  Location: [facility] CATH LAB;  Service: Cardiovascular;  Laterality: N/A;  . TUBAL LIGATION      Current Outpatient Medications  Medication  Sig Dispense Refill  . acetaminophen (TYLENOL) 500 MG tablet Take 500 mg by mouth every 6 (six) hours as needed for moderate pain.    Marland Kitchen aspirin 81 MG tablet Take 81 mg by mouth daily.      . carvedilol (COREG CR) 40 MG 24 hr capsule TAKE 1 CAPSULE EVERY EVENING 90 capsule 1  . clindamycin (CLINDAGEL) 1 % gel Apply 1 application topically daily as needed (infection).     . clopidogrel (PLAVIX) 75 MG tablet TAKE 1 TABLET DAILY 90 tablet 3  . famotidine (PEPCID) 20 MG tablet Take 1 tablet (20 mg total) by mouth 2 (two) times daily. 180 tablet 1  . fexofenadine (ALLEGRA) 180 MG tablet Take 1 tablet (180 mg total) by mouth 2 (two) times daily. 180 tablet 1  . fluticasone (FLONASE) 50 MCG/ACT nasal spray Place 2  sprays into both nostrils daily. (Patient taking differently: Place 2 sprays into both nostrils daily as needed for allergies. ) 48 g 3  . hydrochlorothiazide (MICROZIDE) 12.5 MG capsule Take 1 capsule (12.5 mg total) by mouth daily. 90 capsule 3  . isosorbide mononitrate (IMDUR) 60 MG 24 hr tablet Take 1 tablet (60 mg total) by mouth daily. 90 tablet 3  . Multiple Vitamins-Minerals (CENTRUM SILVER ADULT 50+ PO) Take 1 tablet by mouth daily.    . nitroGLYCERIN (NITROSTAT) 0.4 MG SL tablet DISSOLVE 1 TABLET UNDER THE TONGUE EVERY 5 MINUTES AS NEEDED FOR CHEST PAIN 25 tablet 4  . predniSONE (DELTASONE) 20 MG tablet Take 20 mg by mouth daily as needed (Lip swelling).     . rosuvastatin (CRESTOR) 20 MG tablet Take 1 tablet (20 mg total) by mouth at bedtime. 90 tablet 3  . SYNTHROID 50 MCG tablet TAKE 1 TABLET ALTERNATING WITH ONE AND ONE-HALF TABLETS AS DIRECTED (Patient taking differently: Take 50-75 mcg by mouth daily. Take 50 mcg everyday except Mon, wed, and Fri take 75 mcg.) 108 tablet 3   No current facility-administered medications for this visit.     Allergies:   Other; Adhesive [tape]; Codeine; Bactrim [sulfamethoxazole-trimethoprim]; Cephalexin; Lisinopril; Macrobid [nitrofurantoin monohyd macro]; and Tamiflu [oseltamivir phosphate]    Social History:  The patient  reports that she has never smoked. She has never used smokeless tobacco. She reports that she does not drink alcohol or use drugs.   Family History:  The patient's family history includes AAA (abdominal aortic aneurysm) in her sister; Allergies in her father; CAD (age of onset: 43) in her sister; Cancer in her father and sister; Coronary artery disease (age of onset: 63) in her mother; Diabetes in her maternal aunt and sister; Heart disease in her mother and sister; Hyperlipidemia in her sister; Hypertension in her sister.    ROS:  Please see the history of present illness. All other systems are reviewed and negative.     PHYSICAL EXAM: VS:  BP 118/80   Pulse 72   Ht 5\' 7"  (1.702 m)   Wt 166 lb 6.4 oz (75.5 kg)   BMI 26.06 kg/m  , BMI Body mass index is 26.06 kg/m. GEN: Well nourished, well developed, female in no acute distress  HEENT: normal for age  Neck: no JVD, no carotid bruit, right thyroid is enlarged Cardiac: RRR; soft murmur, no rubs, or gallops Respiratory:  clear to auscultation bilaterally, normal work of breathing GI: soft, nontender, nondistended, + BS MS: no deformity or atrophy; no edema right, Unna boot present on left; distal pulses are 2+ in 3/4 extremities, unable to  palpate pulses in the left lower leg due to wrappings, capillary refill within normal limits Skin: warm and dry, no rash Neuro:  Strength and sensation are intact Psych: euthymic mood, full affect   EKG:  EKG is not ordered today.  CARDIAC CATH: 02/20/2014 ANGIOGRAPHIC DATA:   The left main coronary artery is widely patent. There is mild ostial disease.  The left anterior descending artery is a large vessel which reaches the apex. There is an ostial 40-50% stenosis. There several medium-size diagonals which are patent.  The left circumflex artery is a large vessel proximally. In the mid vessel, there is a 50% stenosis. There is a large ramus vessel which is also widely patent. There several small to medium-sized obtuse marginals which appear patent.  The right coronary artery is a large dominant vessel. In the mid vessel, there is a 50-60% stenosis. There is an early bifurcation of the posterior lateral artery and the posterior descending artery.Marland Kitchen  LEFT VENTRICULOGRAM:  Left ventricular angiogram was done in the 30 RAO projection and revealed normal left ventricular wall motion and systolic function with an estimated ejection fraction of 60 %.  LVEDP was 14 mmHg.  PCI NARRATIVE: A Williams right guiding catheter was used to engage the RCA. A pressure wire was placed across the area disease in the mid right  coronary artery. The resting FFR was 0.98. After IV adenosine, the FFR was 0.90.  IMPRESSIONS:  1. Widely patent left main coronary artery. 2. Unchanged, moderate left anterior descending artery disease with patent branches. 3. Widely patent left circumflex artery and its branches with mild disease. 4. Moderate mid right coronary artery disease which was found to not be significant by FFR. 5. Normal left ventricular systolic function.  LVEDP 14 mmHg.  Ejection fraction 60%.  RECOMMENDATION:  Continue medical therapy.   Recent Labs: 05/03/2018: ALT 18; BUN 14; Creatinine, Ser 0.77; Hemoglobin 12.4; Magnesium 1.9; Platelets 206; Potassium 3.5; Sodium 141; TSH 2.893    Lipid Panel    Component Value Date/Time   CHOL 160 04/11/2018 1240   TRIG 181 (H) 04/11/2018 1240   HDL 56 04/11/2018 1240   CHOLHDL 2.9 04/11/2018 1240   VLDL 34 (H) 04/11/2017 1055   LDLCALC 76 04/11/2018 1240   LDLDIRECT 92.7 07/08/2014 0844     Wt Readings from Last 3 Encounters:  05/17/18 166 lb 6.4 oz (75.5 kg)  05/02/18 168 lb (76.2 kg)  04/13/18 168 lb (76.2 kg)     Other studies Reviewed: Additional studies/ records that were reviewed today include: Office notes, hospital records and testing.  ASSESSMENT AND PLAN:  1. Angina: -She had chest pain in the setting of significant hypertension and known coronary artery disease. -This may well have been angina, but the situation was unusual and she has not had recurrent symptoms. -Continue current therapy with aspirin, high-dose carvedilol, Plavix, Imdur and sublingual nitroglycerin as needed.  2.  Hypertension: Her blood pressure is under good control today.  She is compliant with her medications. -She feels that she was under stress from the angioedema episode and that is why her blood pressure was so high  3.  Recurrent angioedema: She follows with an allergist for this and is compliant with the Allegra, Pepcid, and as needed prednisone. -She  reports that each episode is less severe, is hoping not to have anymore.  3.  Hyperlipidemia: She is on rosuvastatin 20 mg daily - Continue statin, her LDL was minimally above target at 76 in May  Current  medicines are reviewed at length with the patient today.  The patient does not have concerns regarding medicines.  The following changes have been made:  no change  Labs/ tests ordered today include:  No orders of the defined types were placed in this encounter.    Disposition:   FU with Dr. [name]  Signed, Rosaria Ferries, PA-C  05/17/2018 3:52 PM    [facility] Phone: [phone]; Fax: [phone]  This note was written with the assistance of speech recognition software. Please excuse any transcriptional errors.

## 2018-05-17 NOTE — Patient Instructions (Signed)
Medication Instructions: Your physician recommends that you continue on your current medications as directed. Please refer to the Current Medication list given to you today.   Follow-Up: Keep your scheduled appointment with Dr. [name].     Any Other Special Instructions are listed below:  Increase activity with water exercise after your boot is off.

## 2018-06-02 ENCOUNTER — Encounter: Payer: Self-pay | Admitting: Family

## 2018-06-02 ENCOUNTER — Ambulatory Visit: Payer: Self-pay | Admitting: Family

## 2018-06-02 VITALS — BP 148/80 | HR 72 | Temp 98.1°F | Resp 16 | Wt 168.2 lb

## 2018-06-02 DIAGNOSIS — N390 Urinary tract infection, site not specified: Secondary | ICD-10-CM

## 2018-06-02 DIAGNOSIS — R350 Frequency of micturition: Secondary | ICD-10-CM

## 2018-06-02 DIAGNOSIS — A499 Bacterial infection, unspecified: Secondary | ICD-10-CM

## 2018-06-02 MED ORDER — CIPROFLOXACIN HCL 500 MG PO TABS: 500.0000 mg | ORAL_TABLET | Freq: Two times a day (BID) | ORAL | 0 refills | Status: DC

## 2018-06-02 NOTE — Progress Notes (Signed)
Subjective:     Patient ID: Lindsey Erickson, female   DOB: 01/28/1932, 84 y.o.   MRN: [id number]  HPI 84 year old female is in today with c/o burning with urination, frequency and urgency. She has a history of UTIs in the past and reports responding well to Cipro. No fever, chills, back pain or abdominal pain.   Review of Systems  Constitutional: Negative.  Negative for chills and fever.  Respiratory: Negative.   Cardiovascular: Negative.   Gastrointestinal: Negative.  Negative for abdominal pain and nausea.  Genitourinary: Positive for dysuria and frequency.  Musculoskeletal: Negative.   Skin: Negative.        Recently had skin cancer removed from her left lower leg  Allergic/Immunologic: Negative.   Hematological: Negative.   Psychiatric/Behavioral: Negative.    Past Medical History:  Diagnosis Date  . Anemia   . Arthritis   . Blood transfusion without reported diagnosis 1957  . CAD (coronary artery disease)    a. NSTEMI 2004 with thrombus in Cx and mod RCA. b. Nonischemic nuc 2008. c. mild NSTEMI 01/2013 (trop 0.42) -> cath with unchanged mod LAD & RCA disease, for continued medical therapy.  . Cancer ([place])   . Cataract   . H/O Hashimoto thyroiditis   . Hyperlipidemia   . HYPERTENSION    a. Also has h/o intermittentl low BP.  Marland Kitchen Hypothyroidism   . Kidney stones   . Myocardial infarction ([place])   . Osteoporosis    no fractures  . Prediabetes   . QT prolongation    a. Prior EKGs QTc 474, but on 4/1 was 524 for unclear reason.  . Recurrent urinary tract infection   . Squamous cell carcinoma of skin 04/17/2015    Social History   Socioeconomic History  . Marital status: Married    Spouse name: Not on file  . Number of children: Not on file  . Years of education: Not on file  . Highest education level: Not on file  Occupational History  . Not on file  Social Needs  . Financial resource strain: Not on file  . Food insecurity:    Worry: Not on file    Inability:  Not on file  . Transportation needs:    Medical: Not on file    Non-medical: Not on file  Tobacco Use  . Smoking status: Never Smoker  . Smokeless tobacco: Never Used  Substance and Sexual Activity  . Alcohol use: No    Alcohol/week: 0.0 oz  . Drug use: No  . Sexual activity: Not Currently  Lifestyle  . Physical activity:    Days per week: Not on file    Minutes per session: Not on file  . Stress: Not on file  Relationships  . Social connections:    Talks on phone: Not on file    Gets together: Not on file    Attends religious service: Not on file    Active member of club or organization: Not on file    Attends meetings of clubs or organizations: Not on file    Relationship status: Not on file  . Intimate partner violence:    Fear of current or ex partner: Not on file    Emotionally abused: Not on file    Physically abused: Not on file    Forced sexual activity: Not on file  Other Topics Concern  . Not on file  Social History Narrative   Married, 2 children. Retired from [employer]  Past Surgical History:  Procedure Laterality Date  . APPENDECTOMY  1999  . Bilateral Arthroscopies of knees    . CARDIAC CATHETERIZATION     Moderate 3 vessel CAD by cath 05/2012  . CHOLECYSTECTOMY  02/2010  . JOINT REPLACEMENT  01/2010   L TKR - allusio  . LEFT HEART CATHETERIZATION WITH CORONARY ANGIOGRAM N/A 05/22/2012   Procedure: LEFT HEART CATHETERIZATION WITH CORONARY ANGIOGRAM;  Surgeon: Sinclair Grooms, MD;  Location: [facility] CATH LAB;  Service: Cardiovascular;  Laterality: N/A;  . LEFT HEART CATHETERIZATION WITH CORONARY ANGIOGRAM N/A 02/20/2014   Procedure: LEFT HEART CATHETERIZATION WITH CORONARY ANGIOGRAM;  Surgeon: Peter M Martinique, MD;  Location: [facility] CATH LAB;  Service: Cardiovascular;  Laterality: N/A;  . TUBAL LIGATION      Family History  Problem Relation Age of Onset  . Allergies Father   . Cancer Father   . Coronary artery disease Mother 34  . Heart disease Mother   .  CAD Sister 88  . Cancer Sister   . AAA (abdominal aortic aneurysm) Sister   . Hypertension Sister   . Hyperlipidemia Sister   . Diabetes Sister   . Heart disease Sister   . Diabetes Maternal Aunt     Allergies  Allergen Reactions  . 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   ALL NARCOTICS- NAUSEA AND VOMITING   . Adhesive [Tape] Itching, Swelling, Rash and Other (See Comments)    Also pain at location  . Codeine Nausea And Vomiting  . Bactrim [Sulfamethoxazole-Trimethoprim]     Muscle aches  . Cephalexin Hives  . Lisinopril Swelling  . Macrobid [Nitrofurantoin Monohyd Macro] Nausea And Vomiting  . Tamiflu [Oseltamivir Phosphate]     Current Outpatient Medications on File Prior to Visit  Medication Sig Dispense Refill  . acetaminophen (TYLENOL) 500 MG tablet Take 500 mg by mouth every 6 (six) hours as needed for moderate pain.    Marland Kitchen aspirin 81 MG tablet Take 81 mg by mouth daily.      . carvedilol (COREG CR) 40 MG 24 hr capsule TAKE 1 CAPSULE EVERY EVENING 90 capsule 1  . clindamycin (CLINDAGEL) 1 % gel Apply 1 application topically daily as needed (infection).     . clopidogrel (PLAVIX) 75 MG tablet TAKE 1 TABLET DAILY 90 tablet 3  . famotidine (PEPCID) 20 MG tablet Take 1 tablet (20 mg total) by mouth 2 (two) times daily. 180 tablet 1  . fexofenadine (ALLEGRA) 180 MG tablet Take 1 tablet (180 mg total) by mouth 2 (two) times daily. 180 tablet 1  . hydrochlorothiazide (MICROZIDE) 12.5 MG capsule Take 1 capsule (12.5 mg total) by mouth daily. 90 capsule 3  . isosorbide mononitrate (IMDUR) 60 MG 24 hr tablet Take 1 tablet (60 mg total) by mouth daily. 90 tablet 3  . Multiple Vitamins-Minerals (CENTRUM SILVER ADULT 50+ PO) Take 1 tablet by mouth daily.    . nitroGLYCERIN (NITROSTAT) 0.4 MG SL tablet DISSOLVE 1 TABLET UNDER THE TONGUE EVERY 5 MINUTES AS NEEDED FOR CHEST PAIN 25 tablet 4  .  predniSONE (DELTASONE) 20 MG tablet Take 20 mg by mouth daily as needed (Lip swelling).     . rosuvastatin (CRESTOR) 20 MG tablet Take 1 tablet (20 mg total) by mouth at bedtime. 90 tablet 3  . SYNTHROID 50 MCG tablet TAKE 1 TABLET ALTERNATING WITH ONE AND ONE-HALF TABLETS AS DIRECTED (Patient taking  differently: Take 50-75 mcg by mouth daily. Take 50 mcg everyday except Mon, wed, and Fri take 75 mcg.) 108 tablet 3  . fluticasone (FLONASE) 50 MCG/ACT nasal spray Place 2 sprays into both nostrils daily. (Patient not taking: Reported on 06/02/2018) 48 g 3   No current facility-administered medications on file prior to visit.     BP (!) 148/80 (BP Location: Left Arm, Patient Position: Sitting, Cuff Size: Normal)   Pulse 72   Temp 98.1 F (36.7 C) (Oral)   Resp 16   Wt 168 lb 3.2 oz (76.3 kg)   SpO2 98%   BMI 26.34 kg/m chart    Objective:   Physical Exam  Constitutional: She appears well-developed and well-nourished.  Neck: Normal range of motion. Thyromegaly present.  Cardiovascular: Normal rate, regular rhythm and normal heart sounds.  Pulmonary/Chest: Effort normal and breath sounds normal.  Abdominal: Soft. Bowel sounds are normal.  Musculoskeletal: Normal range of motion.  Neurological: She is alert.  Skin: Skin is warm and dry.       Assessment:    Diagnoses and all orders for this visit:  Urine frequency -     POCT Urinalysis Dipstick  Urinary tract infection, bacterial  Other orders -     ciprofloxacin (CIPRO) 500 MG tablet; Take 1 tablet (500 mg total) by mouth 2 (two) times daily.      Plan:     See PCP for recheck of hematuria. Keep appt with doctor for thyroid biopsy. Avoid caffeine. Drink plenty of water

## 2018-06-02 NOTE — Patient Instructions (Signed)

## 2018-06-14 ENCOUNTER — Telehealth: Payer: Self-pay | Admitting: Cardiovascular Disease

## 2018-06-14 NOTE — Telephone Encounter (Signed)
New Message       [facility] Pre-operative Risk Assessment    Request for surgical clearance:  1. What type of surgery is being performed? Fine needle aspiration   2. When is this surgery scheduled? TBD  3. What type of clearance is required (medical clearance vs. Pharmacy clearance to hold med vs. Both)? Pharmacy   4. Are there any medications that need to be held prior to surgery and how long?Holding Plavix five days prior to procedure  5. Practice name and name of physician performing surgery? [facility], Dr. Arizona Constable  6. What is your office phone number [phone]      7.   What is your office fax number [phone].   Anesthesia type (None, local, MAC, general) ? Local   Avaletta L Williams 06/14/2018, 3:11 PM  _________________________________________________________________   (provider comments below)

## 2018-06-16 NOTE — Telephone Encounter (Signed)
Dr [name] can you comment on holding ASA and Plavix for her biopsy.  Kerin Ransom PA-C 06/16/2018 10:43 AM

## 2018-06-20 NOTE — Telephone Encounter (Signed)
Dr. [name] please address holding asa and plavix for biopsy.

## 2018-06-21 NOTE — Telephone Encounter (Signed)
   Primary Cardiologist:Jonathan [name], MD  Chart reviewed as part of pre-operative protocol coverage. Pre-op clearance already addressed by colleagues in earlier phone notes. To summarize recommendations:  - per Dr. [name], ok to hold ASA and Plavix for biopsy.   Will route this bundled recommendation to requesting provider via Epic fax function. Please call with questions.  Lyda Jester, PA-C 06/21/2018, 4:14 PM

## 2018-06-21 NOTE — Telephone Encounter (Signed)
Okay to interrupt aspirin Plavix for biopsy.

## 2018-06-22 ENCOUNTER — Other Ambulatory Visit: Payer: Medicare Other

## 2018-06-26 ENCOUNTER — Ambulatory Visit: Payer: Medicare Other | Admitting: Endocrinology

## 2018-06-28 IMAGING — DX DG CHEST 2V
2 series · 2 of 2 positions shown · non-contrast
Comparison: May 22, 2012

CLINICAL DATA: Chest pressure

EXAM:
CHEST - 2 VIEW

[w chest pa]
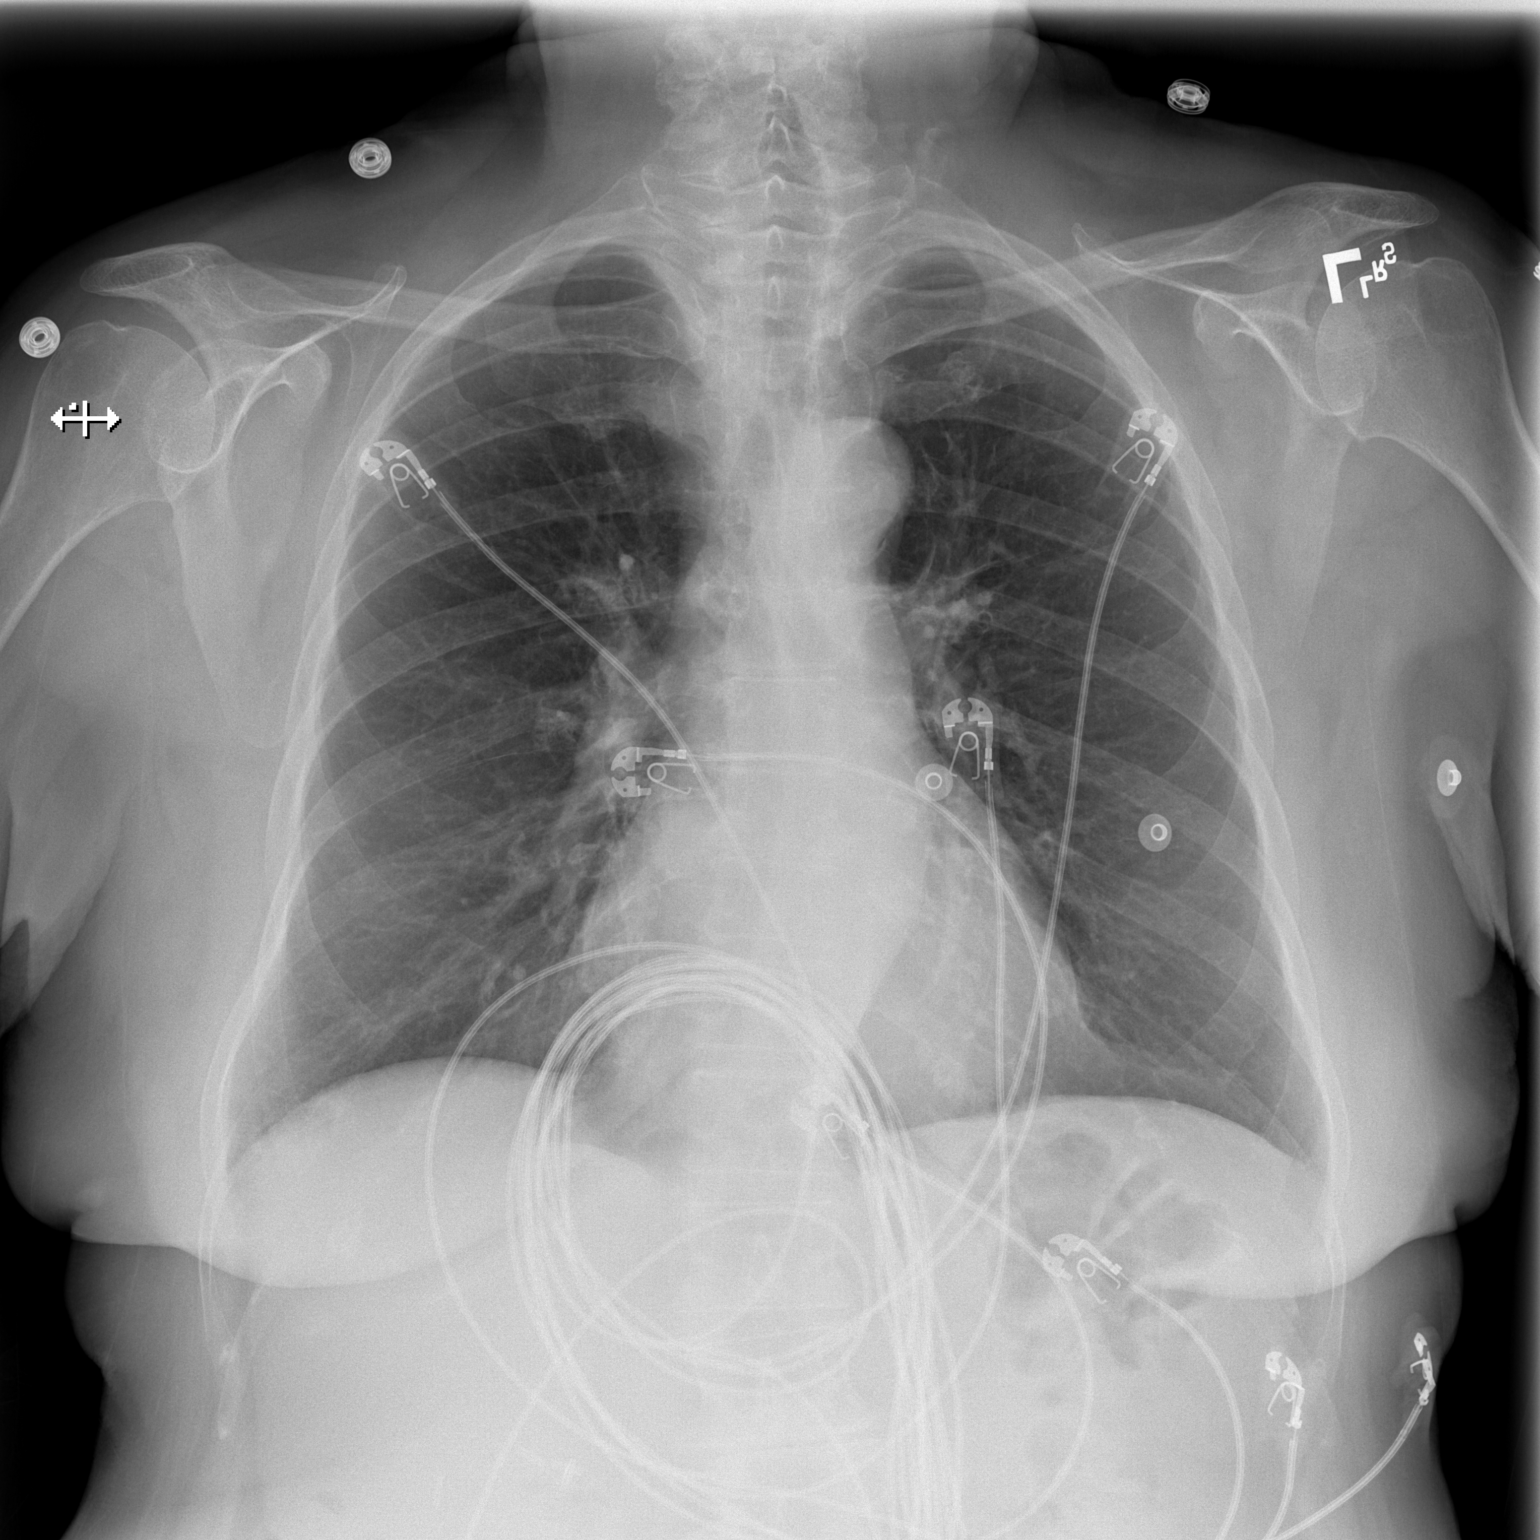

[w chest lat]
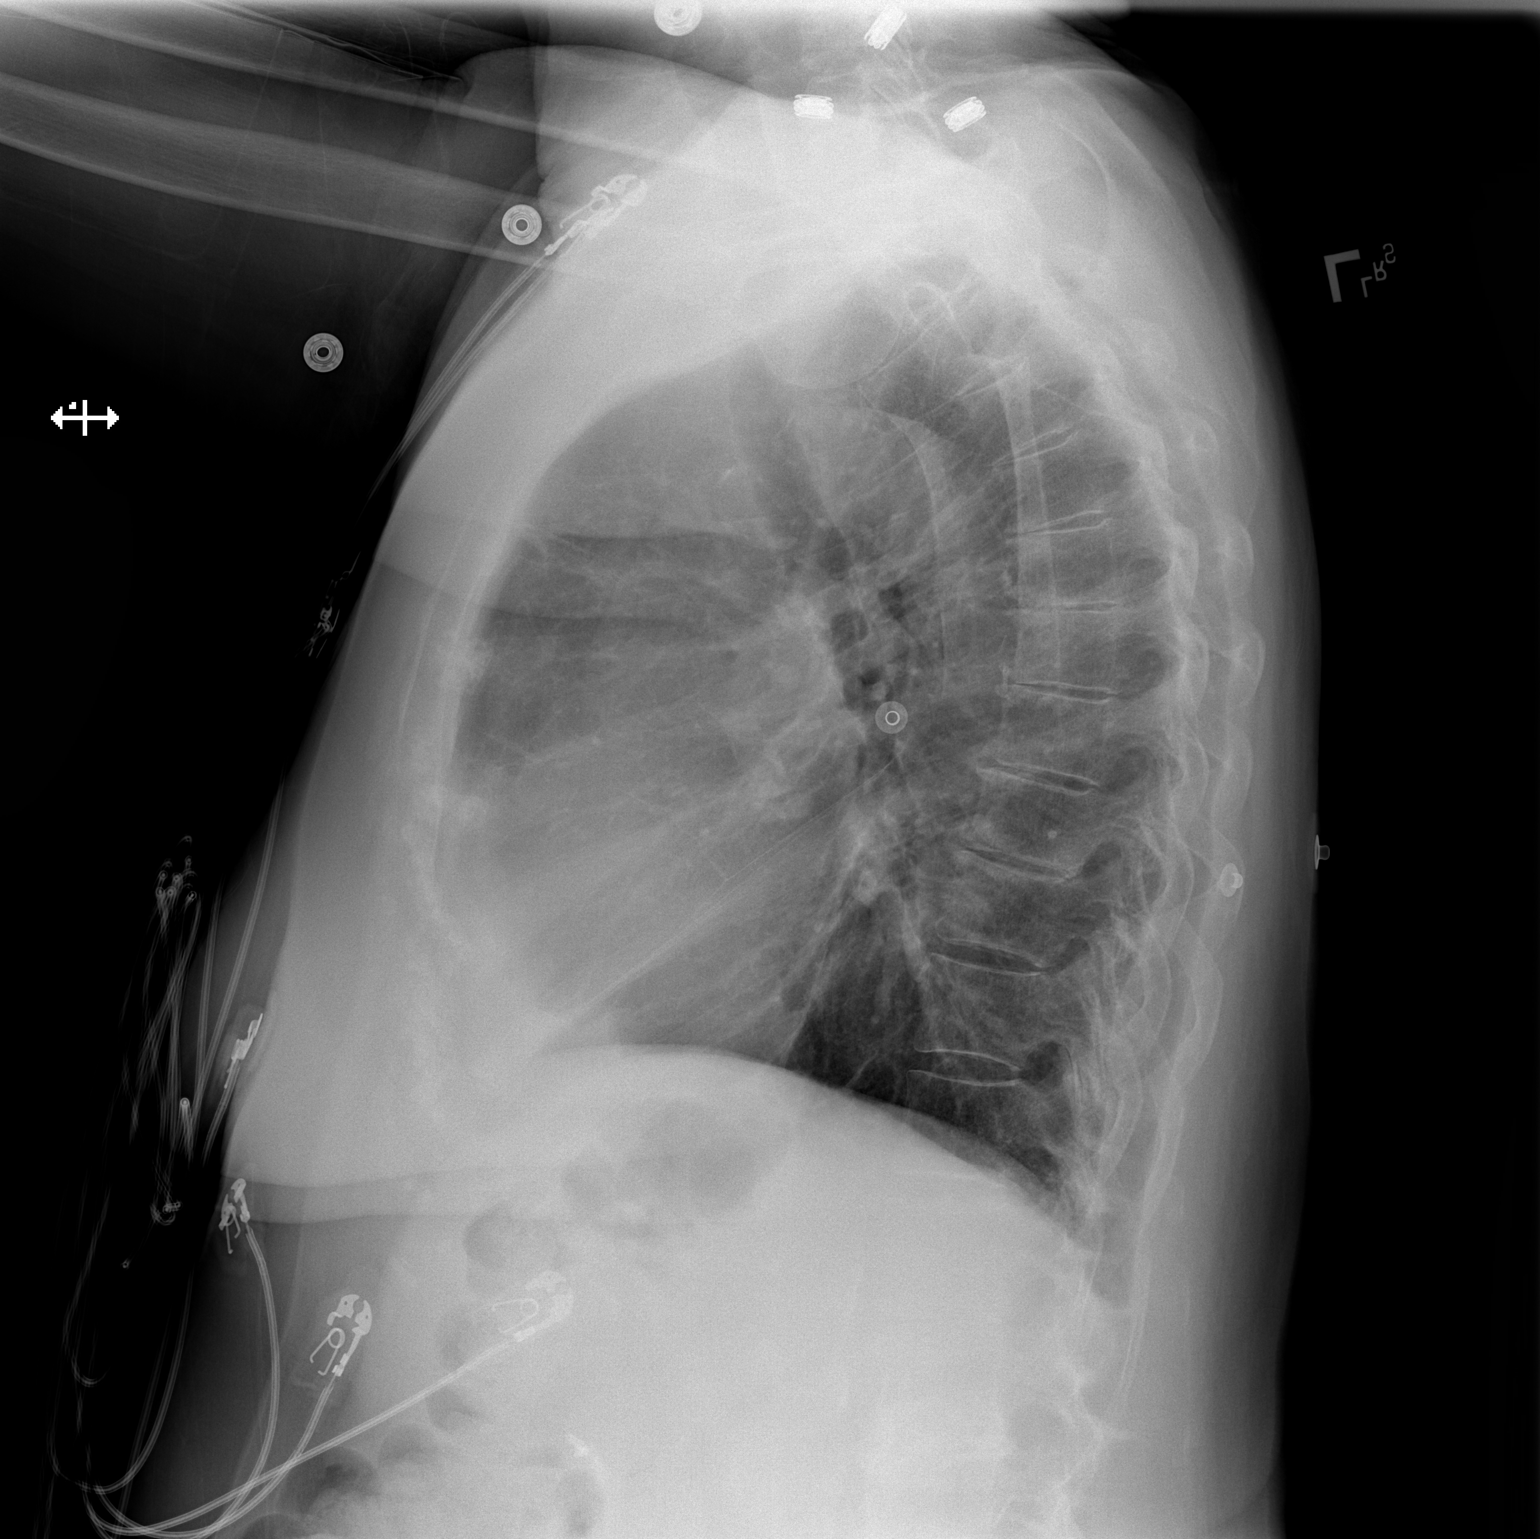

[2 of 2 positions shown; findings below may reference images not displayed]

FINDINGS: The heart size and mediastinal contours are within normal limits.
Both lungs are clear. The visualized skeletal structures are
unremarkable.
IMPRESSION: No active cardiopulmonary disease.

## 2018-07-04 ENCOUNTER — Telehealth: Payer: Self-pay | Admitting: Cardiovascular Disease

## 2018-07-04 NOTE — Telephone Encounter (Signed)
New Message    Pt is calling, states that she has been waiting for her surgical clearance discission, per note in epic something was already sent to  [facility], Dr. Arizona Constable but they said they haven't received anything. Please call

## 2018-07-04 NOTE — Telephone Encounter (Signed)
This was taken care of 06/21/18- I will fax it again electronically and RN will check tomorrow and make sure it got there.  Kerin Ransom PA-C 07/04/2018 4:56 PM

## 2018-07-05 ENCOUNTER — Ambulatory Visit (INDEPENDENT_AMBULATORY_CARE_PROVIDER_SITE_OTHER): Payer: Medicare Other | Admitting: Allergy

## 2018-07-05 ENCOUNTER — Encounter: Payer: Self-pay | Admitting: Allergy

## 2018-07-05 VITALS — BP 126/78 | HR 72 | Temp 98.1°F | Resp 16

## 2018-07-05 DIAGNOSIS — J069 Acute upper respiratory infection, unspecified: Secondary | ICD-10-CM

## 2018-07-05 DIAGNOSIS — I209 Angina pectoris, unspecified: Secondary | ICD-10-CM

## 2018-07-05 DIAGNOSIS — J310 Chronic rhinitis: Secondary | ICD-10-CM

## 2018-07-05 DIAGNOSIS — T783XXD Angioneurotic edema, subsequent encounter: Secondary | ICD-10-CM | POA: Diagnosis not present

## 2018-07-05 DIAGNOSIS — B9789 Other viral agents as the cause of diseases classified elsewhere: Secondary | ICD-10-CM | POA: Diagnosis not present

## 2018-07-05 MED ORDER — FEXOFENADINE HCL 180 MG PO TABS: 180.0000 mg | ORAL_TABLET | Freq: Two times a day (BID) | ORAL | 12 refills | Status: DC

## 2018-07-05 MED ORDER — FAMOTIDINE 20 MG PO TABS: 20.0000 mg | ORAL_TABLET | Freq: Two times a day (BID) | ORAL | 12 refills | Status: DC

## 2018-07-05 NOTE — Progress Notes (Signed)
Follow-up Note  RE: Lindsey Erickson MRN: [id number] DOB: [birth year] Date of Office Visit: 07/05/2018   History of present illness: Lindsey Erickson is a 84 y.o. female presenting today for follow-up of lip swelling.  Since she was last seen here in May 2019 by Dr. Nelva Bush she has been taking Allegra BID and Pepcid BID. She had one episode of lip swelling on June 10th. At that time she took one 20mg  prednisone with quick resolution of her swelling. The next day she had pain in her neck that felt similar to her previous MIs, so she went to the ED. Her cardiac workup there was negative and she was discharged home. She also reports that she has had cold-like symptoms for about 9 days with fever to 101, congestion, and cough. She has been taking Mucinex and Delsym, doing daily nasal saline rinses and salt-water throat gargles, and using cough drops. She reports that her symptoms have improved and she is feeling much better, but continues to have a cough. She denies sinus or facial tenderness.   Review of systems: Review of Systems  Constitutional: Positive for fever and malaise/fatigue. Negative for weight loss.  HENT: Positive for congestion and sore throat. Negative for ear discharge, ear pain, nosebleeds and sinus pain.   Eyes: Negative for pain, discharge and redness.  Respiratory: Positive for cough. Negative for sputum production, shortness of breath and wheezing.   Cardiovascular: Negative for chest pain.  Gastrointestinal: Negative for abdominal pain, constipation, diarrhea, heartburn, nausea and vomiting.  Musculoskeletal: Negative for joint pain.  Skin: Negative for itching and rash.  Neurological: Negative for headaches.    All other systems negative unless noted above in HPI  Past medical/social/surgical/family history have been reviewed and are unchanged unless specifically indicated below.  No changes  Medication List: Allergies as of 07/05/2018      Reactions   Other  Other (See Comments)   Frozen Blood Plasma-caused patient to crash / cardiac arrest / CAN NEVER TAKE THIS AGAIN-PER MD'S.  ALL NARCOTICS- NAUSEA AND VOMITING    Adhesive [tape] Itching, Swelling, Rash, Other (See Comments)   Also pain at location   Codeine Nausea And Vomiting   Bactrim [sulfamethoxazole-trimethoprim]    Muscle aches   Cephalexin Hives   Lisinopril Swelling   Macrobid [nitrofurantoin Monohyd Macro] Nausea And Vomiting   Tamiflu [oseltamivir Phosphate]       Medication List        Accurate as of 07/05/18 11:30 AM. Always use your most recent med list.          acetaminophen 500 MG tablet Commonly known as:  TYLENOL Take 500 mg by mouth every 6 (six) hours as needed for moderate pain.   aspirin 81 MG tablet Take 81 mg by mouth daily.   carvedilol 40 MG 24 hr capsule Commonly known as:  COREG CR TAKE 1 CAPSULE EVERY EVENING   CENTRUM SILVER ADULT 50+ PO Take 1 tablet by mouth daily.   ciprofloxacin 500 MG tablet Commonly known as:  CIPRO Take 1 tablet (500 mg total) by mouth 2 (two) times daily.   clindamycin 1 % gel Commonly known as:  CLINDAGEL Apply 1 application topically daily as needed (infection).   clopidogrel 75 MG tablet Commonly known as:  PLAVIX TAKE 1 TABLET DAILY   famotidine 20 MG tablet Commonly known as:  PEPCID Take 1 tablet (20 mg total) by mouth 2 (two) times daily.   fexofenadine 180 MG tablet Commonly known as:  ALLEGRA Take 1 tablet (180 mg total) by mouth 2 (two) times daily.   fluticasone 50 MCG/ACT nasal spray Commonly known as:  FLONASE Place 2 sprays into both nostrils daily.   hydrochlorothiazide 12.5 MG capsule Commonly known as:  MICROZIDE Take 1 capsule (12.5 mg total) by mouth daily.   isosorbide mononitrate 60 MG 24 hr tablet Commonly known as:  IMDUR Take 1 tablet (60 mg total) by mouth daily.   nitroGLYCERIN 0.4 MG SL tablet Commonly known as:  NITROSTAT DISSOLVE 1 TABLET UNDER THE TONGUE EVERY 5  MINUTES AS NEEDED FOR CHEST PAIN   predniSONE 20 MG tablet Commonly known as:  DELTASONE Take 20 mg by mouth daily as needed (Lip swelling).   rosuvastatin 20 MG tablet Commonly known as:  CRESTOR Take 1 tablet (20 mg total) by mouth at bedtime.   SYNTHROID 50 MCG tablet Generic drug:  levothyroxine TAKE 1 TABLET ALTERNATING WITH ONE AND ONE-HALF TABLETS AS DIRECTED       Known medication allergies: Allergies  Allergen Reactions  . 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   ALL NARCOTICS- NAUSEA AND VOMITING   . Adhesive [Tape] Itching, Swelling, Rash and Other (See Comments)    Also pain at location  . Codeine Nausea And Vomiting  . Bactrim [Sulfamethoxazole-Trimethoprim]     Muscle aches  . Cephalexin Hives  . Lisinopril Swelling  . Macrobid [Nitrofurantoin Monohyd Macro] Nausea And Vomiting  . Tamiflu [Oseltamivir Phosphate]      Physical examination: Blood pressure 126/78, pulse 72, temperature 98.1 F (36.7 C), temperature source Oral, resp. rate 16, SpO2 99 %.  General: Alert, interactive, in no acute distress. HEENT: PEERL. TMs pearly gray, turbinates minimally edematous without discharge, post-pharynx mildly erythematous. Neck: Supple without lymphadenopathy. Lungs: Clear to auscultation without wheezing, rhonchi or rales. {no increased work of breathing. CV: Normal S1, S2 without murmurs. Abdomen: Nondistended, nontender. Skin: Warm and dry, without lesions or rashes. Extremities:  No clubbing, cyanosis or edema. Neuro:   Grossly intact.  Diagnositics/Labs: Labs: None  Spirometry: None  Allergy testing: None  Assessment and plan:   Idiopathic Angioedema (lip swelling)     - episodic angioedema can be due to variety of different triggers.  ACE-I (lisinopril) is a known medication that can cause angioedema of the oral cavity.   The swelling can continue for months  even despite stopping the medication however she has continued to have episodes of angioedema now 6 months after discontinuation thus with negative work-up believe now symptoms are idiopathic.  It does seem however that her episodes are becoming less frequent and less severe.  ACEIs should not be taken again in the future.   Labs for swelling were largely unremarkable except for low level sensitivity to elm tree pollen.      - recommend taking a high-dose antihistamine regimen to help reduce/prevent swelling episodes: take Allegra 180mg  twice daily and  Pepcid 20mg  twice a day.          Weaning schedule:  stop evening dose of Pepcid x 2 weeks then if no swelling episodes stop the morning dose of Pedpid x 2 weeks then if no swelling episodes stop the evening dose of Allegra x 2 weeks then stop and continue on the morning Allegra dose.       - may use prednisone 20mg  for acute onset of lip swelling if occurs again in future as  needed  Nasal congestion and ear fullness   - slight sensitivity to elm tree pollen (spring time allergen)   - may use Flonase 1-2 sprays each nostril for nasal congestion.  May use 3 days a week (M/W/F) if issues with nosebleeds.   Also recommend use of nasal saline spray prior to use of Flonase.  May use nasal saline multiple times a day if needed for moisturization.    Viral upper respiratory tract illness   - symptoms are improving   - continue use of nasal saline rinse   - may continue as needed use of Mucinex and delsym for symptom relief  Follow-up 4-6 months or sooner if needed     Vilma Prader PGY-1  I performed a history and physical examination of the patient and discussed management with the resident. I reviewed the resident's note and agree with the documented findings and plan of care. The note in its entirety was edited by myself, including the physical exam, assessment, and plan.    Prudy Feeler, MD [facility]

## 2018-07-05 NOTE — Telephone Encounter (Signed)
Spoke with Dr Melvyn Neth office and the receptionist stated the clearance was received. (I faxed this morning at 7:53am) Patient notified direct and wanted a copy of clearance letter sent via [place].

## 2018-07-05 NOTE — Patient Instructions (Signed)
Idiopathic Angioedema (lip swelling)     - episodic angioedema can be due to variety of different triggers.  ACE-I (lisinopril) is a known medication that can cause angioedema of the oral cavity.   The swelling can continue for months even despite stopping the medication.  This category of medications should not be taken again in the future.   Labs for swelling were largely unremarkable except for low level sensitivity to elm tree pollen.      - recommend taking a high-dose antihistamine regimen to help reduce/prevent swelling episodes: take Allegra 180mg  twice daily and  Pepcid 20mg  twice a day.          Weaning schedule:  stop evening dose of Pepcid x 2 weeks then if no swelling episodes stop the morning dose of Pedpid x 2 weeks then if no swelling episodes stop the evening dose of Allegra x 2 weeks then stop and continue on the morning Allegra dose.       - may use prednisone 20mg  for acute onset of lip swelling if occurs again in future as needed  Nasal congestion and ear fullness   - slight sensitivity to elm tree pollen (spring time allergen)   - may use Flonase 1-2 sprays each nostril for nasal congestion.  May use 3 days a week (M/W/F) if issues with nosebleeds.   Also recommend use of nasal saline spray prior to use of Flonase.  May use nasal saline multiple times a day if needed for moisturization.    Viral upper respiratory tract illness   - symptoms are improving   - continue use of nasal saline rinse   - may continue as needed use of Mucinex and delsym for symptom relief  Follow-up 4-6 months or sooner if needed

## 2018-08-16 ENCOUNTER — Ambulatory Visit (INDEPENDENT_AMBULATORY_CARE_PROVIDER_SITE_OTHER): Payer: Medicare Other

## 2018-08-16 DIAGNOSIS — Z23 Encounter for immunization: Secondary | ICD-10-CM

## 2018-08-16 NOTE — Progress Notes (Signed)
Patient came in today for her annual flu vaccine. Flu per patient's preference was given in the Left upper quad of gluteus. Patient tolerated well. VIS was given.

## 2018-08-27 IMAGING — US US THYROID
1 series · 13 of 25 positions shown · non-contrast
Comparison: None.

CLINICAL DATA: Goiter. 85-year-old female with palpable right-sided
thyroid gland. On chronic thyroid hormone replacement therapy.

EXAM:
THYROID ULTRASOUND
TECHNIQUE: Ultrasound examination of the thyroid gland and adjacent soft
tissues was performed.

[Series 1: us thyroid · 0.07mm/px · 13 of 45 slices shown]
[im 1/45]
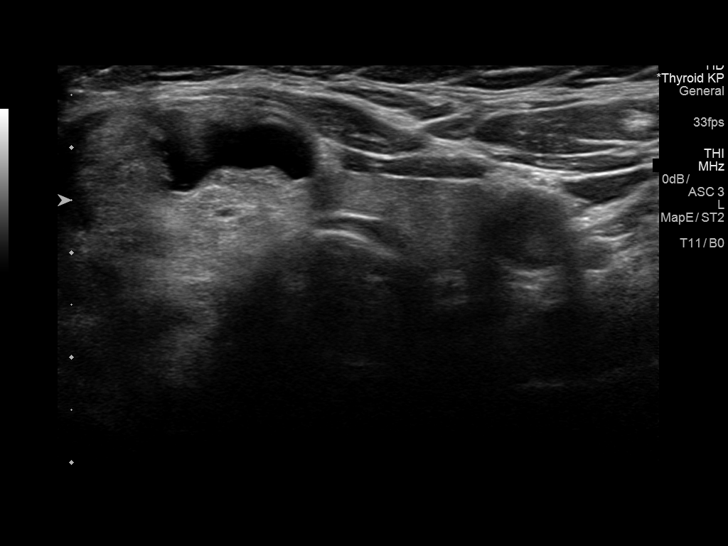
[im 4/45]
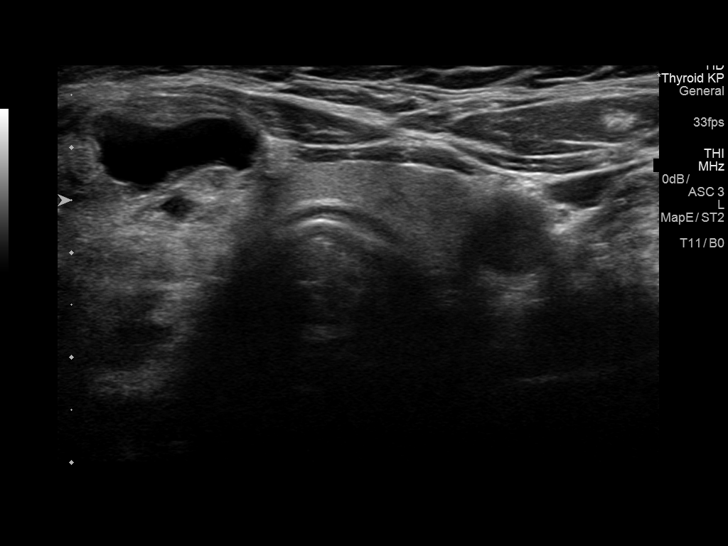
[im 8/45]
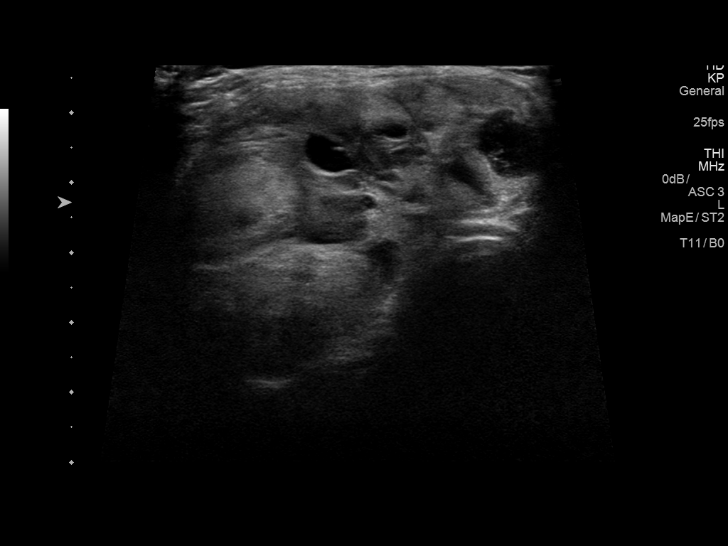
[im 12/45]
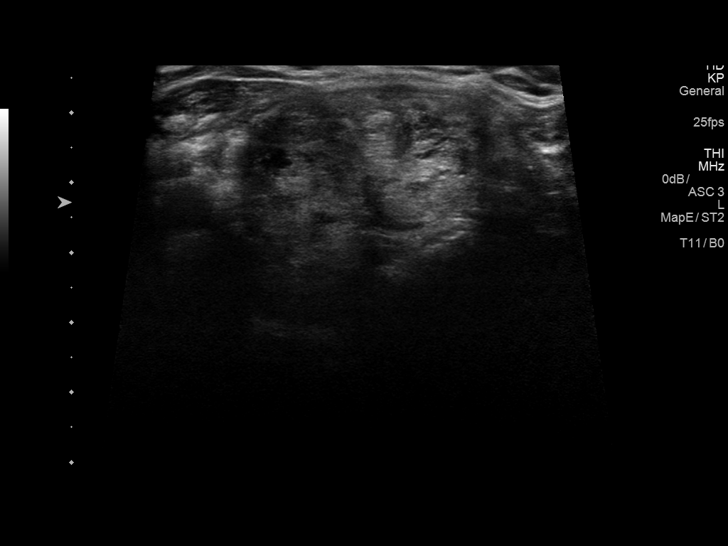
[im 15/45]
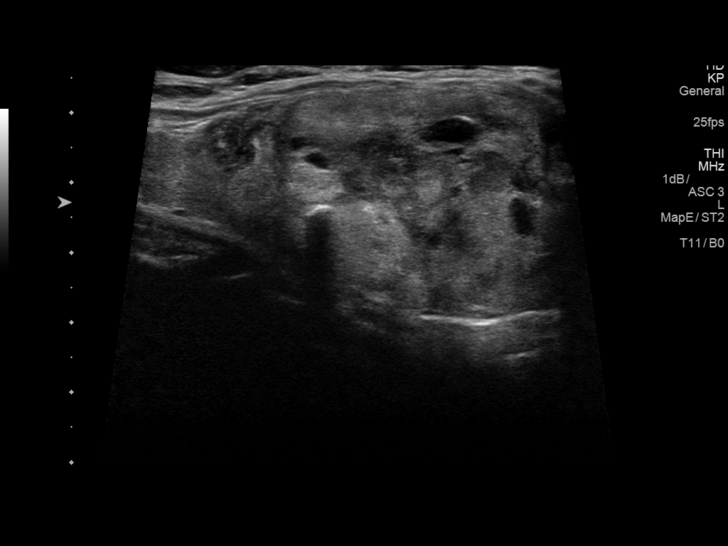
[im 19/45]
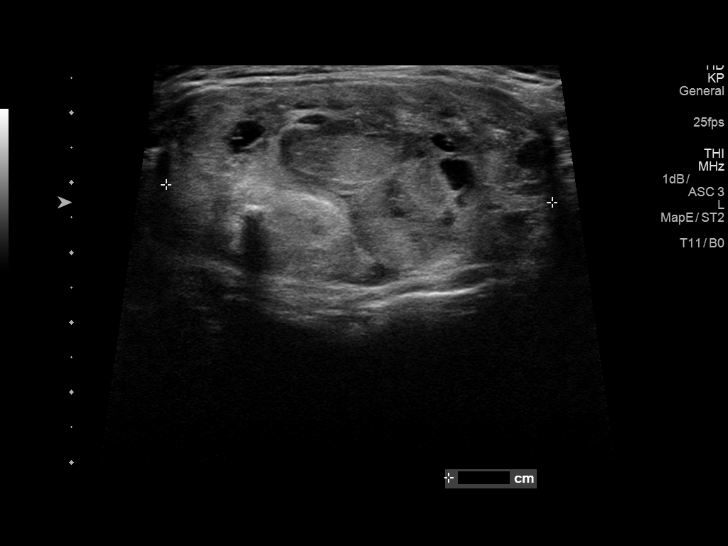
[im 23/45]
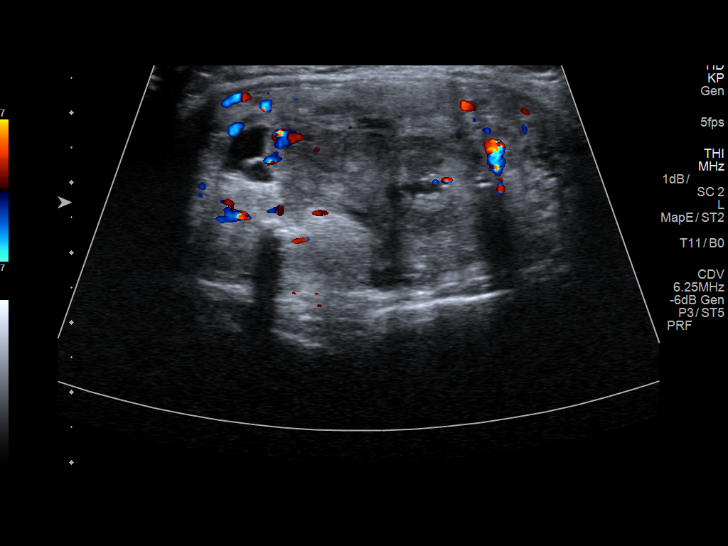
[im 26/45]
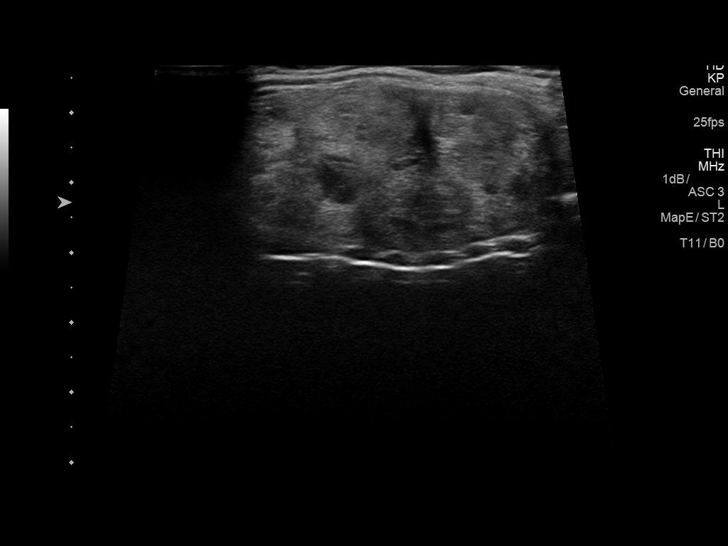
[im 30/45]
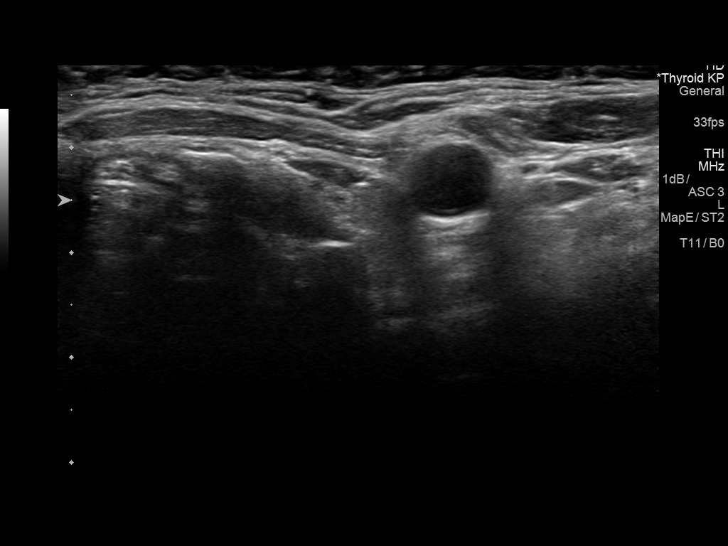
[im 34/45]
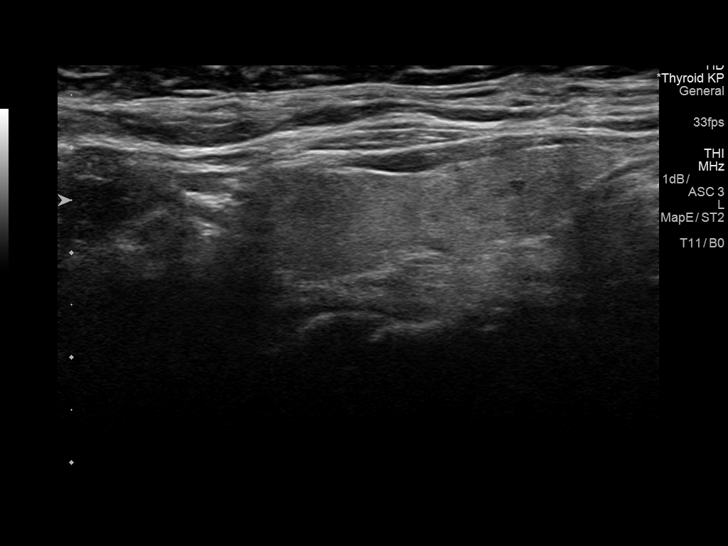
[im 37/45]
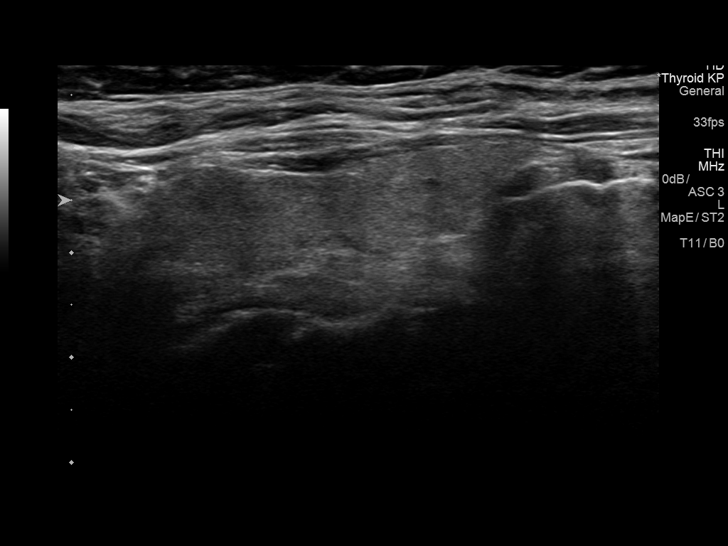
[im 41/45]
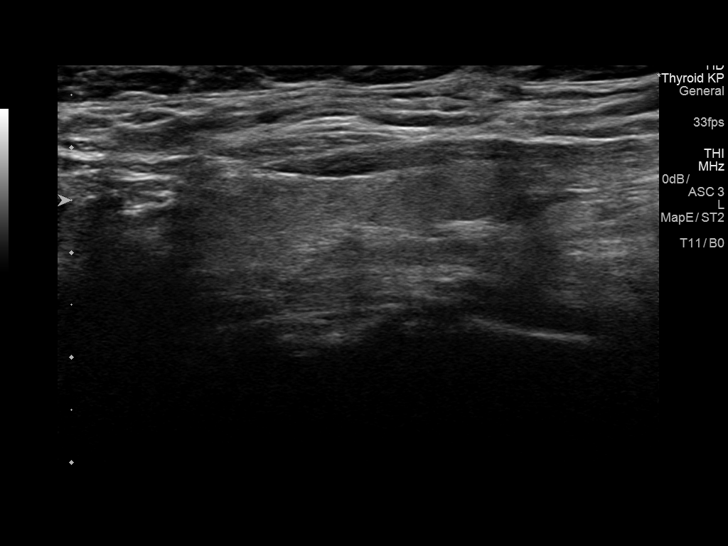
[im 45/45]
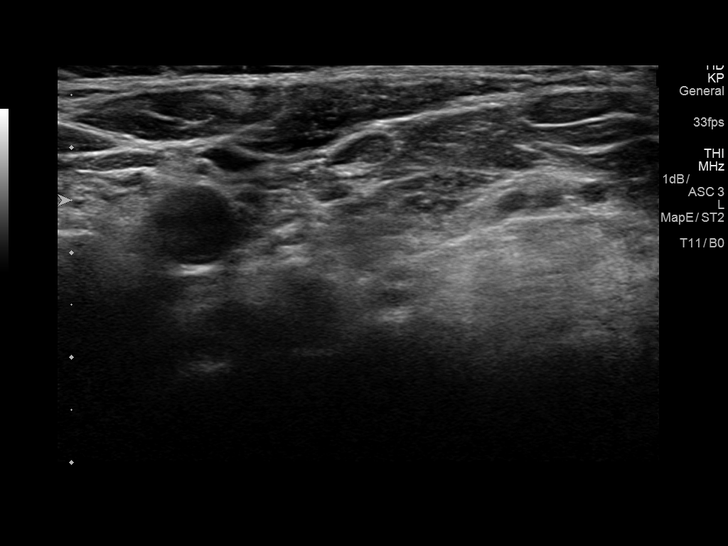

[13 of 25 positions shown; findings below may reference images not displayed]

FINDINGS: Parenchymal Echotexture: Mildly heterogenous

Isthmus: 0.4 cm

Right lobe: 7.7 x 3.3 x 3.9 cm

Left lobe: 3.9 x 1.1 x 1.1 cm

_________________________________________________________

Estimated total number of nodules >/= 1 cm: 1

Number of spongiform nodules >/=  2 cm not described below (TR1): 0

Number of mixed cystic and solid nodules >/= 1.5 cm not described
below (TR2): 0

_________________________________________________________

Nodule # 1:

Location: Right; Mid

Maximum size: 5.8 cm; Other 2 dimensions: 4.0 x 5.5 cm

Composition: solid/almost completely solid (2)

Echogenicity: isoechoic (1)

Shape: not taller-than-wide (0)

Margins: ill-defined (0)

Echogenic foci: macrocalcifications (1)

ACR TI-RADS total points: 4.

ACR TI-RADS risk category: TR4 (4-6 points).

ACR TI-RADS recommendations:

**Given size (>/= 1.5 cm) and appearance, fine needle aspiration of
this moderately suspicious nodule should be considered based on
TI-RADS criteria.

_________________________________________________________
IMPRESSION: Large TI-RADS category 4 nodule measuring up to 5.8 cm in occupying
the majority of the right gland meets consensus criteria for
consideration of fine-needle aspiration biopsy.

The above is in keeping with the ACR TI-RADS recommendations - [HOSPITAL] 6615;[DATE].

## 2018-09-10 ENCOUNTER — Other Ambulatory Visit: Payer: Self-pay | Admitting: Family Medicine

## 2018-09-29 ENCOUNTER — Ambulatory Visit (HOSPITAL_COMMUNITY)
Admission: EM | Admit: 2018-09-29 | Discharge: 2018-09-29 | Disposition: A | Discharge status: Home / Self Care | Payer: Medicare Other | Attending: Internal Medicine | Admitting: Internal Medicine

## 2018-09-29 ENCOUNTER — Encounter (HOSPITAL_COMMUNITY): Payer: Self-pay | Admitting: Emergency Medicine

## 2018-09-29 DIAGNOSIS — Z79899 Other long term (current) drug therapy: Secondary | ICD-10-CM | POA: Diagnosis not present

## 2018-09-29 DIAGNOSIS — E063 Autoimmune thyroiditis: Secondary | ICD-10-CM | POA: Diagnosis not present

## 2018-09-29 DIAGNOSIS — Z79890 Hormone replacement therapy (postmenopausal): Secondary | ICD-10-CM | POA: Diagnosis not present

## 2018-09-29 DIAGNOSIS — I10 Essential (primary) hypertension: Secondary | ICD-10-CM | POA: Diagnosis not present

## 2018-09-29 DIAGNOSIS — I252 Old myocardial infarction: Secondary | ICD-10-CM | POA: Insufficient documentation

## 2018-09-29 DIAGNOSIS — M810 Age-related osteoporosis without current pathological fracture: Secondary | ICD-10-CM | POA: Insufficient documentation

## 2018-09-29 DIAGNOSIS — I2510 Atherosclerotic heart disease of native coronary artery without angina pectoris: Secondary | ICD-10-CM | POA: Diagnosis not present

## 2018-09-29 DIAGNOSIS — R7303 Prediabetes: Secondary | ICD-10-CM | POA: Insufficient documentation

## 2018-09-29 DIAGNOSIS — R350 Frequency of micturition: Secondary | ICD-10-CM | POA: Diagnosis present

## 2018-09-29 DIAGNOSIS — M1711 Unilateral primary osteoarthritis, right knee: Secondary | ICD-10-CM | POA: Diagnosis not present

## 2018-09-29 DIAGNOSIS — Z7982 Long term (current) use of aspirin: Secondary | ICD-10-CM | POA: Diagnosis not present

## 2018-09-29 DIAGNOSIS — E785 Hyperlipidemia, unspecified: Secondary | ICD-10-CM | POA: Diagnosis not present

## 2018-09-29 DIAGNOSIS — N3090 Cystitis, unspecified without hematuria: Secondary | ICD-10-CM | POA: Insufficient documentation

## 2018-09-29 DIAGNOSIS — Z7951 Long term (current) use of inhaled steroids: Secondary | ICD-10-CM | POA: Insufficient documentation

## 2018-09-29 DIAGNOSIS — R300 Dysuria: Secondary | ICD-10-CM | POA: Diagnosis present

## 2018-09-29 DIAGNOSIS — Z85828 Personal history of other malignant neoplasm of skin: Secondary | ICD-10-CM | POA: Insufficient documentation

## 2018-09-29 DIAGNOSIS — Z87442 Personal history of urinary calculi: Secondary | ICD-10-CM | POA: Insufficient documentation

## 2018-09-29 MED ORDER — CIPROFLOXACIN HCL 500 MG PO TABS: 500.0000 mg | ORAL_TABLET | Freq: Two times a day (BID) | ORAL | 0 refills | Status: DC

## 2018-09-29 NOTE — ED Provider Notes (Signed)
[facility]    CSN: [id number] Arrival date & time: 09/29/18  1012     History   Chief Complaint Chief Complaint  Patient presents with  . Urinary Frequency  . Dysuria    HPI Lindsey Erickson is a 84 y.o. female.   84 year old female comes in for 1 day history of urinary symptoms.  Has had urinary frequency, dysuria, pressure with urinating.  Denies abdominal pain, but states feels suprapubic pressure.  Denies fever, chills, night sweats.  Denies nausea, vomiting.  States has had the symptoms in the past, and usually comes back as a urinary tract infection, and came in for evaluation.  She has been recently treated for UTI back in July, and May, both with Cipro due to numerous allergies to antibiotic.     Past Medical History:  Diagnosis Date  . Anemia   . Angio-edema   . Arthritis   . Blood transfusion without reported diagnosis 1957  . CAD (coronary artery disease)    a. NSTEMI 2004 with thrombus in Cx and mod RCA. b. Nonischemic nuc 2008. c. mild NSTEMI 01/2013 (trop 0.42) -> cath with unchanged mod LAD & RCA disease, for continued medical therapy.  . Cancer ([place])   . Cataract   . H/O Hashimoto thyroiditis   . Hyperlipidemia   . HYPERTENSION    a. Also has h/o intermittentl low BP.  Marland Kitchen Hypothyroidism   . Kidney stones   . Myocardial infarction ([place])   . Osteoporosis    no fractures  . Prediabetes   . QT prolongation    a. Prior EKGs QTc 474, but on 4/1 was 524 for unclear reason.  . Recurrent urinary tract infection   . Squamous cell carcinoma of skin 04/17/2015  . Urticaria     Patient Active Problem List   Diagnosis Date Noted  . Atypical chest pain 05/03/2018  . Angio-edema   . Squamous cell carcinoma of skin 04/17/2015  . Cystitis 08/03/2014  . NSTEMI (non-ST elevated myocardial infarction) ([place]) 02/21/2014  . QT prolongation 02/21/2014  . CAD (coronary artery disease)   . Hyperlipidemia   . Multinodular goiter 10/03/2013  . Osteoarthritis  of right knee   . Osteoporosis with fracture hx 02/15/2011  . Hypothyroid 02/15/2011  . Kidney stones   . Essential hypertension 06/22/2010  . Other diseases of lung, not elsewhere classified 02/25/2010    Past Surgical History:  Procedure Laterality Date  . ADENOIDECTOMY    . APPENDECTOMY  1999  . Bilateral Arthroscopies of knees    . CARDIAC CATHETERIZATION     Moderate 3 vessel CAD by cath 05/2012  . CHOLECYSTECTOMY  02/2010  . JOINT REPLACEMENT  01/2010   L TKR - allusio  . LEFT HEART CATHETERIZATION WITH CORONARY ANGIOGRAM N/A 05/22/2012   Procedure: LEFT HEART CATHETERIZATION WITH CORONARY ANGIOGRAM;  Surgeon: Sinclair Grooms, MD;  Location[facility] C[facility] LAB;  Service: Cardiovascular;  Laterality: N/A;  . LEFT HEART CATHETERIZATION WITH CORONARY ANGIOGRAM N/A 02/20/2014   Procedure: LEFT HEART CATHETERIZATION WITH CORONARY ANGIOGRAM;  Surgeon: Peter M Martinique, MD;  Location[facility] C[facility] LAB;  Service: Cardiovascular;  Laterality: N/A;  . TONSILLECTOMY    . TUBAL LIGATION      OB History   None      Home Medications    Prior to Admission medications   Medication Sig Start Date End Date Taking? Authorizing Provider  acetaminophen (TYLENOL) 500 MG tablet Take 500 mg by mouth every 6 (six) hours  as needed for moderate pain.    [provider]  aspirin 81 MG tablet Take 81 mg by mouth daily.      [provider]  carvedilol (COREG CR) 40 MG 24 hr capsule TAKE 1 CAPSULE EVERY EVENING 09/11/18   Susy Frizzle, MD  ciprofloxacin (CIPRO) 500 MG tablet Take 1 tablet (500 mg total) by mouth 2 (two) times daily. 09/29/18   Tasia Catchings, Amy V, PA-C  clindamycin (CLINDAGEL) 1 % gel Apply 1 application topically daily as needed (infection).     [provider]  clopidogrel (PLAVIX) 75 MG tablet TAKE 1 TABLET DAILY 04/03/18   Susy Frizzle, MD  famotidine (PEPCID) 20 MG tablet Take 1 tablet (20 mg total) by mouth 2 (two) times daily. 07/05/18   Kennith Gain, MD    fexofenadine (ALLEGRA) 180 MG tablet Take 1 tablet (180 mg total) by mouth 2 (two) times daily. 07/05/18   Kennith Gain, MD  fluticasone (FLONASE) 50 MCG/ACT nasal spray Place 2 sprays into both nostrils daily. 11/11/16   Susy Frizzle, MD  hydrochlorothiazide (MICROZIDE) 12.5 MG capsule Take 1 capsule (12.5 mg total) by mouth daily. 05/17/18   Barrett, Evelene Croon, PA-C  isosorbide mononitrate (IMDUR) 60 MG 24 hr tablet Take 1 tablet (60 mg total) by mouth daily. 04/13/18   Susy Frizzle, MD  Multiple Vitamins-Minerals (CENTRUM SILVER ADULT 50+ PO) Take 1 tablet by mouth daily.    [provider]  nitroGLYCERIN (NITROSTAT) 0.4 MG SL tablet DISSOLVE 1 TABLET UNDER THE TONGUE EVERY 5 MINUTES AS NEEDED FOR CHEST PAIN 05/17/18   Barrett, Evelene Croon, PA-C  predniSONE (DELTASONE) 20 MG tablet Take 20 mg by mouth daily as needed (Lip swelling).     [provider]  rosuvastatin (CRESTOR) 20 MG tablet Take 1 tablet (20 mg total) by mouth at bedtime. 04/28/18   Susy Frizzle, MD  SYNTHROID 50 MCG tablet TAKE 1 TABLET ALTERNATING WITH ONE AND ONE-HALF TABLETS AS DIRECTED Patient taking differently: Take 50-75 mcg by mouth daily. Take 50 mcg everyday except Mon, wed, and Fri take 75 mcg. 01/18/18   Elayne Snare, MD    Family History Family History  Problem Relation Age of Onset  . Allergies Father   . Cancer Father   . Coronary artery disease Mother 75  . Heart disease Mother   . CAD Sister 33  . Cancer Sister   . AAA (abdominal aortic aneurysm) Sister   . Hypertension Sister   . Hyperlipidemia Sister   . Diabetes Sister   . Heart disease Sister   . Diabetes Maternal Aunt     Social History Social History   Tobacco Use  . Smoking status: Never Smoker  . Smokeless tobacco: Never Used  Substance Use Topics  . Alcohol use: No    Alcohol/week: 0.0 standard drinks  . Drug use: No     Allergies   Other; Adhesive [tape]; Codeine; Bactrim  [sulfamethoxazole-trimethoprim]; Cephalexin; Lisinopril; Macrobid [nitrofurantoin monohyd macro]; and Tamiflu [oseltamivir phosphate]   Review of Systems Review of Systems  Reason unable to perform ROS: See HPI as above.     Physical Exam Triage Vital Signs ED Triage Vitals  Enc Vitals Group     BP 09/29/18 1121 (!) 153/74     Pulse Rate 09/29/18 1121 75     Resp 09/29/18 1121 14     Temp 09/29/18 1118 98.3 F (36.8 C)     Temp Source 09/29/18 1118 Oral  SpO2 09/29/18 1121 99 %     Weight --      Height --      Head Circumference --      Peak Flow --      Pain Score 09/29/18 1118 3     Pain Loc --      Pain Edu? --      Excl. in [place]? --    No data found.  Updated Vital Signs BP (!) 153/74   Pulse 75   Temp 98.3 F (36.8 C) (Oral)   Resp 14   SpO2 99%   Physical Exam  Constitutional: She is oriented to person, place, and time. She appears well-developed and well-nourished. No distress.  HENT:  Head: Normocephalic and atraumatic.  Eyes: Pupils are equal, round, and reactive to light. Conjunctivae are normal.  Cardiovascular: Normal rate, regular rhythm and normal heart sounds. Exam reveals no gallop and no friction rub.  No murmur heard. Pulmonary/Chest: Effort normal and breath sounds normal. She has no wheezes. She has no rales.  Abdominal: Soft. Bowel sounds are normal. She exhibits no mass. There is no tenderness. There is no rebound, no guarding and no CVA tenderness.  Neurological: She is alert and oriented to person, place, and time.  Skin: Skin is warm and dry.  Psychiatric: She has a normal mood and affect. Her behavior is normal. Judgment normal.     UC Treatments / Results  Labs (all labs ordered are listed, but only abnormal results are displayed) Labs Reviewed  URINE CULTURE   Large leuks Negative nitrite Negative protein pH 7.0 Small hemoglobin Specific gravity 1.10 Negative ketones Negative bili Negative  glucose  EKG None  Radiology No results found.  Procedures Procedures (including critical care time)  Medications Ordered in UC Medications - No data to display  Initial Impression / Assessment and Plan / UC Course  I have reviewed the triage vital signs and the nursing notes.  Pertinent labs & imaging results that were available during my care of the patient were reviewed by me and considered in my medical decision making (see chart for details).    Urine dipstick positive for urinary tract infection.  Patient with allergies to Keflex, Macrobid, Bactrim.  Discussed awaiting urine culture prior to treatment.  Given patient with significant symptoms of dysuria and pressure, would like to treat with Cipro starting today.  Start Cipro as directed.  Push fluids.  Return precautions given.  Patient expresses understanding and agrees to plan.  Discussed with patient to follow-up with PCP for further evaluation of frequent UTIs.  Final Clinical Impressions(s) / UC Diagnoses   Final diagnoses:  Cystitis    ED Prescriptions    Medication Sig Dispense Auth. Provider   ciprofloxacin (CIPRO) 500 MG tablet Take 1 tablet (500 mg total) by mouth 2 (two) times daily. 10 tablet Tobin Chad, PA-C 09/29/18 1503

## 2018-09-29 NOTE — ED Triage Notes (Signed)
Complaints of urinary frequency, dysuria for one day. Patient feels burning sensation worse when standing

## 2018-09-29 NOTE — Discharge Instructions (Signed)
Your urine was positive for an urinary tract infection. Start ciprofloxacin as directed. Keep hydrated, your urine should be clear to pale yellow in color. Monitor for any worsening of symptoms, fever, worsening abdominal pain, nausea/vomiting, flank pain, follow up for reevaluation.

## 2018-10-01 LAB — URINE CULTURE: Culture: 80000 — AB

## 2018-10-02 ENCOUNTER — Telehealth (HOSPITAL_COMMUNITY): Payer: Self-pay | Admitting: Emergency Medicine

## 2018-10-02 NOTE — Telephone Encounter (Signed)
Urine culture was positive for e coli and was given ciprofloxacin at urgent care visit. Attempted to reach patient. No answer at this time. Voicemail left.

## 2018-10-04 ENCOUNTER — Telehealth (HOSPITAL_COMMUNITY): Payer: Self-pay | Admitting: Emergency Medicine

## 2018-10-04 NOTE — Telephone Encounter (Signed)
Called and spoke to patient about culture results. Pt is finishing up her antibiotics and is feeling better. No further questions.

## 2018-11-20 ENCOUNTER — Other Ambulatory Visit (INDEPENDENT_AMBULATORY_CARE_PROVIDER_SITE_OTHER): Payer: Medicare Other

## 2018-11-20 DIAGNOSIS — I10 Essential (primary) hypertension: Secondary | ICD-10-CM

## 2018-11-20 DIAGNOSIS — R7303 Prediabetes: Secondary | ICD-10-CM | POA: Diagnosis not present

## 2018-11-20 DIAGNOSIS — E042 Nontoxic multinodular goiter: Secondary | ICD-10-CM

## 2018-11-20 LAB — TSH: TSH: 2.77 u[IU]/mL (ref 0.35–4.50)

## 2018-11-20 LAB — BASIC METABOLIC PANEL
BUN: 17 mg/dL (ref 6–23)
CO2: 29 mEq/L (ref 19–32)
Calcium: 9.4 mg/dL (ref 8.4–10.5)
Chloride: 102 mEq/L (ref 96–112)
Creatinine, Ser: 0.77 mg/dL (ref 0.40–1.20)
GFR: 75.85 mL/min (ref >60.00)
GLUCOSE: 102 mg/dL — AB (ref 70–99)
Potassium: 3.6 mEq/L (ref 3.5–5.1)
Sodium: 138 mEq/L (ref 135–145)

## 2018-11-20 LAB — T4, FREE: FREE T4: 1.04 ng/dL (ref 0.60–1.60)

## 2018-11-20 LAB — HEMOGLOBIN A1C: Hgb A1c MFr Bld: 5.9 % (ref 4.6–6.5)

## 2018-11-24 ENCOUNTER — Encounter: Payer: Self-pay | Admitting: Endocrinology

## 2018-11-24 ENCOUNTER — Ambulatory Visit (INDEPENDENT_AMBULATORY_CARE_PROVIDER_SITE_OTHER): Payer: Medicare Other | Admitting: Endocrinology

## 2018-11-24 VITALS — BP 128/78 | HR 76 | Ht 67.0 in

## 2018-11-24 DIAGNOSIS — R7303 Prediabetes: Secondary | ICD-10-CM | POA: Diagnosis not present

## 2018-11-24 DIAGNOSIS — E042 Nontoxic multinodular goiter: Secondary | ICD-10-CM | POA: Diagnosis not present

## 2018-11-24 DIAGNOSIS — I10 Essential (primary) hypertension: Secondary | ICD-10-CM

## 2018-11-24 NOTE — Progress Notes (Signed)
Patient ID: Lindsey Erickson, female   DOB: 06-30-1932, 85 y.o.   MRN: [id number]   Reason for Appointment:  Endocrinology followup     History of Present Illness:   She initially had a multinodular goiter in 1982 She has had 3 biopsies of her thyroid nodules subsequently with the last one in 2003 which were benign Her biopsies had shown a few follicular cells and lymphocytes, not clear which nodule was biopsied  ? Hypothyroidism:  Most likely she has been on thyroid suppression for the goiter rather than true hypothyroidism although previous records do indicate Hashimoto thyroiditis  The treatments that the patient  had taken include Synthroid 88 mcg for several years  This was reduced to 50 g in 11/16 because of low normal TSH of 0.36.           Because of her symptoms of feeling a little tired and cold even with normal TSH in the past her dose was increased She is taking an additional half tablet 3 times a week of her 50 mcg levothyroxine  Subsequent TSH levels have been normal and fairly consistent  Does not complain of any unusual fatigue or cold intolerance She does not think her weight has changed  She is very regular with taking her levothyroxine before breakfast as prescribed .   Lab Results  Component Value Date   TSH 2.77 11/20/2018   TSH 2.893 05/03/2018   TSH 2.02 12/22/2017   FREET4 1.04 11/20/2018   FREET4 1.01 12/22/2017   FREET4 0.98 05/06/2017    GOITER: She has had a long-standing enlargement, mostly in the midline Does not complain of any local pressure or discomfort, does have a little pressure when she is lying flat but no choking No difficulty swallowing   Her thyroid enlargement has been unchanged in the last few years with minimal change in neck circumference on the last visit However her PCP decided that he will get a ultrasound done  Subsequently she had a biopsy done elsewhere in 9/19 and this was again benign, diagnosis was benign  hyperplastic thyroid nodule category 2   PREDIABETES:   Her last fasting glucose previously has been as high as 129 2017 December Has  had a glucose tolerance test which did not show any increase after glucose load  A1c has been consistently in the upper normal range, has been as low as 5.4 and recently 5.9 Also her fasting glucose appears to be better at 102  Occasionally has had prednisone for her angioedema  She generally trying to watch her diet with portions about hydrating the usually  Could not weigh her today but she thinks the weight is about the same  Wt Readings from Last 3 Encounters:  06/02/18 168 lb 3.2 oz (76.3 kg)  05/17/18 166 lb 6.4 oz (75.5 kg)  05/02/18 168 lb (76.2 kg)    Lab Results  Component Value Date   HGBA1C 5.9 11/20/2018   HGBA1C 5.8 12/22/2017   HGBA1C 5.4 05/09/2017   Lab Results  Component Value Date   LDLCALC 76 04/11/2018   CREATININE 0.77 11/20/2018     Allergies as of 11/24/2018      Reactions   Other Other (See Comments)   Frozen Blood Plasma-caused patient to crash / cardiac arrest / CAN NEVER TAKE THIS AGAIN-PER MD'S.  ALL NARCOTICS- NAUSEA AND VOMITING    Adhesive [tape] Itching, Swelling, Rash, Other (See Comments)   Also pain at location   Codeine Nausea And  Vomiting   Bactrim [sulfamethoxazole-trimethoprim]    Muscle aches   Cephalexin Hives   Lisinopril Swelling   Macrobid [nitrofurantoin Monohyd Macro] Nausea And Vomiting   Tamiflu [oseltamivir Phosphate]       Medication List       Accurate as of November 24, 2018 10:22 AM. Always use your most recent med list.        acetaminophen 500 MG tablet Commonly known as:  TYLENOL Take 500 mg by mouth every 6 (six) hours as needed for moderate pain.   aspirin 81 MG tablet Take 81 mg by mouth daily.   carvedilol 40 MG 24 hr capsule Commonly known as:  COREG CR TAKE 1 CAPSULE EVERY EVENING   CENTRUM SILVER ADULT 50+ PO Take 1 tablet by mouth daily.   clindamycin  1 % gel Commonly known as:  CLINDAGEL Apply 1 application topically daily as needed (infection).   clopidogrel 75 MG tablet Commonly known as:  PLAVIX TAKE 1 TABLET DAILY   famotidine 20 MG tablet Commonly known as:  PEPCID Take 1 tablet (20 mg total) by mouth 2 (two) times daily.   fexofenadine 180 MG tablet Commonly known as:  ALLEGRA Take 1 tablet (180 mg total) by mouth 2 (two) times daily.   hydrochlorothiazide 12.5 MG capsule Commonly known as:  MICROZIDE Take 1 capsule (12.5 mg total) by mouth daily.   isosorbide mononitrate 60 MG 24 hr tablet Commonly known as:  IMDUR Take 1 tablet (60 mg total) by mouth daily.   nitroGLYCERIN 0.4 MG SL tablet Commonly known as:  NITROSTAT DISSOLVE 1 TABLET UNDER THE TONGUE EVERY 5 MINUTES AS NEEDED FOR CHEST PAIN   predniSONE 20 MG tablet Commonly known as:  DELTASONE Take 20 mg by mouth daily as needed (Lip swelling).   rosuvastatin 20 MG tablet Commonly known as:  CRESTOR Take 1 tablet (20 mg total) by mouth at bedtime.   SYNTHROID 50 MCG tablet Generic drug:  levothyroxine TAKE 1 TABLET ALTERNATING WITH ONE AND ONE-HALF TABLETS AS DIRECTED       Allergies:  Allergies  Allergen Reactions  . 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   ALL NARCOTICS- NAUSEA AND VOMITING   . Adhesive [Tape] Itching, Swelling, Rash and Other (See Comments)    Also pain at location  . Codeine Nausea And Vomiting  . Bactrim [Sulfamethoxazole-Trimethoprim]     Muscle aches  . Cephalexin Hives  . Lisinopril Swelling  . Macrobid [Nitrofurantoin Monohyd Macro] Nausea And Vomiting  . Tamiflu [Oseltamivir Phosphate]     Past Medical History:  Diagnosis Date  . Anemia   . Angio-edema   . Arthritis   . Blood transfusion without reported diagnosis 1957  . CAD (coronary artery disease)    a. NSTEMI 2004 with thrombus in Cx and mod RCA. b. Nonischemic  nuc 2008. c. mild NSTEMI 01/2013 (trop 0.42) -> cath with unchanged mod LAD & RCA disease, for continued medical therapy.  . Cancer ([place])   . Cataract   . H/O Hashimoto thyroiditis   . Hyperlipidemia   . HYPERTENSION    a. Also has h/o intermittentl low BP.  Marland Kitchen Hypothyroidism   . Kidney stones   . Myocardial infarction ([place])   . Osteoporosis    no fractures  . Prediabetes   . QT prolongation    a. Prior EKGs QTc 474, but on 4/1 was 524 for unclear  reason.  . Recurrent urinary tract infection   . Squamous cell carcinoma of skin 04/17/2015  . Urticaria     Past Surgical History:  Procedure Laterality Date  . ADENOIDECTOMY    . APPENDECTOMY  1999  . Bilateral Arthroscopies of knees    . CARDIAC CATHETERIZATION     Moderate 3 vessel CAD by cath 05/2012  . CHOLECYSTECTOMY  02/2010  . JOINT REPLACEMENT  01/2010   L TKR - allusio  . LEFT HEART CATHETERIZATION WITH CORONARY ANGIOGRAM N/A 05/22/2012   Procedure: LEFT HEART CATHETERIZATION WITH CORONARY ANGIOGRAM;  Surgeon: Sinclair Grooms, MD;  Location: [facility] CATH LAB;  Service: Cardiovascular;  Laterality: N/A;  . LEFT HEART CATHETERIZATION WITH CORONARY ANGIOGRAM N/A 02/20/2014   Procedure: LEFT HEART CATHETERIZATION WITH CORONARY ANGIOGRAM;  Surgeon: Peter M Martinique, MD;  Location: [facility] CATH LAB;  Service: Cardiovascular;  Laterality: N/A;  . TONSILLECTOMY    . TUBAL LIGATION      Family History  Problem Relation Age of Onset  . Allergies Father   . Cancer Father   . Coronary artery disease Mother 38  . Heart disease Mother   . CAD Sister 41  . Cancer Sister   . AAA (abdominal aortic aneurysm) Sister   . Hypertension Sister   . Hyperlipidemia Sister   . Diabetes Sister   . Heart disease Sister   . Diabetes Maternal Aunt     Social History:  reports that she has never smoked. She has never used smokeless tobacco. She reports that she does not drink alcohol or use drugs.  REVIEW Of SYSTEMS:  History of CAD, followed regularly by  cardiologist  Hypertension: Is on a 2 drug regimen, Followed by PCP and cardiologist   BP Readings from Last 3 Encounters:  11/24/18 128/78  09/29/18 (!) 153/74  07/05/18 126/78      Examination:   BP 128/78 (BP Location: Left Arm, Patient Position: Sitting, Cuff Size: Normal)   Pulse 76   Ht 5\' 7"  (1.702 m)   SpO2 97%   BMI 26.34 kg/m    Thyroid is enlarged and visible, mostly in the midline Thyroid is relatively smooth and firm with the largest part in the isthmus  Lower neck circumference is 41, previously 41.5  No lymphadenopathy in the neck    Assessments/Plan   Impaired fasting glucose:   Her blood sugars are not showing any signs of progression with A1c still in prediabetic range at 5.9 but fasting glucose is 102 Considering her age no need for further intervention Occasionally will have prednisone for angioedema and this may affect her blood sugars  She will come back in about 6 months for follow-up  Multinodular goiter, long-standing, clinically stable with no change in local pressure symptoms Has previous history of 3 biopsies which have been benign  Examination shows a goiter to be about the same in size on measurement with neck circumference She did get a biopsy done by her PCP which was benign  Mild hypothyroidism: She feels fairly good recently TSH is consistently normal  She is taking brand name Synthroid 50 g, 8-1/2 tablets a week  She will continue the same dosage and follow-up in 6 months as before     Elayne Snare 11/24/2018, 10:22 AM

## 2018-12-13 ENCOUNTER — Encounter: Payer: Self-pay | Admitting: Family Medicine

## 2018-12-13 ENCOUNTER — Other Ambulatory Visit: Payer: Self-pay

## 2018-12-13 ENCOUNTER — Ambulatory Visit (INDEPENDENT_AMBULATORY_CARE_PROVIDER_SITE_OTHER): Payer: Medicare Other | Admitting: Family Medicine

## 2018-12-13 VITALS — BP 130/80 | HR 74 | Temp 98.7°F | Resp 14 | Ht 67.0 in | Wt 170.0 lb

## 2018-12-13 DIAGNOSIS — N3001 Acute cystitis with hematuria: Secondary | ICD-10-CM

## 2018-12-13 LAB — URINALYSIS, ROUTINE W REFLEX MICROSCOPIC
Bilirubin Urine: NEGATIVE
Glucose, UA: NEGATIVE
Ketones, ur: NEGATIVE
Nitrite: NEGATIVE
SPECIFIC GRAVITY, URINE: 1.02 (ref 1.001–1.03)
pH: 6.5 (ref 5.0–8.0)

## 2018-12-13 LAB — MICROSCOPIC MESSAGE

## 2018-12-13 MED ORDER — CIPROFLOXACIN HCL 500 MG PO TABS: 500.0000 mg | ORAL_TABLET | Freq: Two times a day (BID) | ORAL | 0 refills | Status: DC

## 2018-12-13 NOTE — Progress Notes (Signed)
   Subjective:    Patient ID: Lindsey Erickson, female    DOB: 1932-11-02, 85 y.o.   MRN: [id number]  Patient presents for Dysuria (x3 days- burning with urination, urinary frequency)    History of recurrent UTI, given antibiotics in [facility]    Dysuria started Monday evening, typicaly burning with urination, urinary frequency.   No fever.  She has history of what also sounds like interstitial cystitis states that she used to get bladder installations in the past.  We talked about going back to urology however she has been reluctant to do so.  She is treated 4 times in 2019 for urinary tract infection between our office and urgent care.  Review Of Systems:  GEN- denies fatigue, fever, weight loss,weakness, recent illness HEENT- denies eye drainage, change in vision, nasal discharge, CVS- denies chest pain, palpitations RESP- denies SOB, cough, wheeze ABD- denies N/V, change in stools, abd pain GU- +dysuria, denies hematuria, dribbling, incontinence MSK- denies joint pain, muscle aches, injury Neuro- denies headache, dizziness, syncope, seizure activity       Objective:    BP 130/80   Pulse 74   Temp 98.7 F (37.1 C) (Oral)   Resp 14   Ht 5\' 7"  (1.702 m)   Wt 170 lb (77.1 kg) Comment: patient reported  SpO2 98%   BMI 26.63 kg/m  GEN- NAD, alert and oriented x3 HEENT- PERRL, EOMI, non injected sclera, pink conjunctiva, MMM, oropharynx clear CVS- RRR, no murmur RESP-CTAB ABD-NABS,soft,NT,ND, no CVA tenderness  EXT- No edema Pulses- Radial 2+        Assessment & Plan:      Problem List Items Addressed This Visit    None    Visit Diagnoses    Acute cystitis with hematuria    -  Primary    Urine culture sent, start Cipro, I think she has interstitial cystitis as well, she is declining urology at this time   Relevant Orders   Urinalysis, Routine w reflex microscopic (Completed)   Urine Culture      Note: This dictation was prepared with Dragon  dictation along with smaller phrase technology. Any transcriptional errors that result from this process are unintentional.

## 2018-12-13 NOTE — Patient Instructions (Addendum)
Get cranberry tablets  Take antibiotics F/U Lab visit in 3 weeks for URINE

## 2018-12-14 ENCOUNTER — Emergency Department (HOSPITAL_COMMUNITY)
Admission: EM | Admit: 2018-12-14 | Discharge: 2018-12-14 | Disposition: A | Discharge status: Home / Self Care | Payer: Medicare Other | Attending: Emergency Medicine | Admitting: Emergency Medicine

## 2018-12-14 ENCOUNTER — Other Ambulatory Visit: Payer: Self-pay

## 2018-12-14 ENCOUNTER — Encounter (HOSPITAL_COMMUNITY): Payer: Self-pay

## 2018-12-14 DIAGNOSIS — E039 Hypothyroidism, unspecified: Secondary | ICD-10-CM | POA: Insufficient documentation

## 2018-12-14 DIAGNOSIS — I10 Essential (primary) hypertension: Secondary | ICD-10-CM | POA: Diagnosis not present

## 2018-12-14 DIAGNOSIS — T7840XA Allergy, unspecified, initial encounter: Secondary | ICD-10-CM | POA: Diagnosis not present

## 2018-12-14 DIAGNOSIS — F419 Anxiety disorder, unspecified: Secondary | ICD-10-CM | POA: Insufficient documentation

## 2018-12-14 DIAGNOSIS — Z96652 Presence of left artificial knee joint: Secondary | ICD-10-CM | POA: Diagnosis not present

## 2018-12-14 DIAGNOSIS — Z79899 Other long term (current) drug therapy: Secondary | ICD-10-CM | POA: Diagnosis not present

## 2018-12-14 DIAGNOSIS — Z7902 Long term (current) use of antithrombotics/antiplatelets: Secondary | ICD-10-CM | POA: Insufficient documentation

## 2018-12-14 DIAGNOSIS — I2510 Atherosclerotic heart disease of native coronary artery without angina pectoris: Secondary | ICD-10-CM | POA: Diagnosis not present

## 2018-12-14 DIAGNOSIS — Z7982 Long term (current) use of aspirin: Secondary | ICD-10-CM | POA: Diagnosis not present

## 2018-12-14 DIAGNOSIS — Z85828 Personal history of other malignant neoplasm of skin: Secondary | ICD-10-CM | POA: Insufficient documentation

## 2018-12-14 DIAGNOSIS — R21 Rash and other nonspecific skin eruption: Secondary | ICD-10-CM

## 2018-12-14 MED ORDER — FOSFOMYCIN TROMETHAMINE 3 G PO PACK
3.0000 g | PACK | Freq: Once | ORAL | Status: AC
  Administered 2018-12-14: 3 g via ORAL
  Filled 2018-12-14: qty 3

## 2018-12-14 MED ORDER — DIPHENHYDRAMINE HCL 25 MG PO CAPS
25.0000 mg | ORAL_CAPSULE | Freq: Once | ORAL | Status: AC
  Administered 2018-12-14: 25 mg via ORAL
  Filled 2018-12-14: qty 1

## 2018-12-14 MED ORDER — FAMOTIDINE 20 MG PO TABS
20.0000 mg | ORAL_TABLET | Freq: Once | ORAL | Status: AC
  Administered 2018-12-14: 20 mg via ORAL
  Filled 2018-12-14: qty 1

## 2018-12-14 NOTE — ED Triage Notes (Signed)
Pt states that she took one cipro dose for a UTI yesterday afternoon at 4pm, she went to bed at 2230 and woke up with a rash and her heart racing Pt says that she didn't take the benedryl  SHe has never been allergic to cipro before

## 2018-12-14 NOTE — ED Notes (Signed)
Patient ambulatory to restroom with steady gait and NAD.

## 2018-12-14 NOTE — ED Provider Notes (Signed)
[facility] [facility] DEPT Provider Note   CSN: [id number] Arrival date & time: 12/14/18  0603     History   Chief Complaint Chief Complaint  Patient presents with  . Allergic Reaction    HPI Lindsey Erickson is a 85 y.o. female with history of hypertension, hypothyroidism, Hashimoto thyroiditis with goiter, QT prolongation, recurrent UTI who presents with mildly itchy rash and feeling like her heart is racing.  Patient reports she took Cipro at 4 PM and went to bed at 2230 last night.  She woke up with 3 welts to her chest.  She also reports feeling short of breath and her heart is racing.  She also reports she is anxious.  She denies any chest pain, abdominal pain, nausea, vomiting, swelling of her lips, tongue, throat.  She does have history of angioedema which she does not feel she is having today, but it concerned her.  She did not take any medications for her symptoms.  She has taken Cipro before and is allergic to many other antibiotics for her recurrent UTI.  HPI  Past Medical History:  Diagnosis Date  . Anemia   . Angio-edema   . Arthritis   . Blood transfusion without reported diagnosis 1957  . CAD (coronary artery disease)    a. NSTEMI 2004 with thrombus in Cx and mod RCA. b. Nonischemic nuc 2008. c. mild NSTEMI 01/2013 (trop 0.42) -> cath with unchanged mod LAD & RCA disease, for continued medical therapy.  . Cancer ([place])   . Cataract   . H/O Hashimoto thyroiditis   . Hyperlipidemia   . HYPERTENSION    a. Also has h/o intermittentl low BP.  Marland Kitchen Hypothyroidism   . Kidney stones   . Myocardial infarction ([place])   . Osteoporosis    no fractures  . Prediabetes   . QT prolongation    a. Prior EKGs QTc 474, but on 4/1 was 524 for unclear reason.  . Recurrent urinary tract infection   . Squamous cell carcinoma of skin 04/17/2015  . Urticaria     Patient Active Problem List   Diagnosis Date Noted  . Atypical chest pain 05/03/2018  . Angio-edema     . Squamous cell carcinoma of skin 04/17/2015  . Cystitis 08/03/2014  . NSTEMI (non-ST elevated myocardial infarction) ([place]) 02/21/2014  . QT prolongation 02/21/2014  . CAD (coronary artery disease)   . Hyperlipidemia   . Multinodular goiter 10/03/2013  . Osteoarthritis of right knee   . Osteoporosis with fracture hx 02/15/2011  . Hypothyroid 02/15/2011  . Kidney stones   . Essential hypertension 06/22/2010  . Other diseases of lung, not elsewhere classified 02/25/2010    Past Surgical History:  Procedure Laterality Date  . ADENOIDECTOMY    . APPENDECTOMY  1999  . Bilateral Arthroscopies of knees    . CARDIAC CATHETERIZATION     Moderate 3 vessel CAD by cath 05/2012  . CHOLECYSTECTOMY  02/2010  . JOINT REPLACEMENT  01/2010   L TKR - allusio  . LEFT HEART CATHETERIZATION WITH CORONARY ANGIOGRAM N/A 05/22/2012   Procedure: LEFT HEART CATHETERIZATION WITH CORONARY ANGIOGRAM;  Surgeon: Sinclair Grooms, MD;  Location: [facility] CATH LAB;  Service: Cardiovascular;  Laterality: N/A;  . LEFT HEART CATHETERIZATION WITH CORONARY ANGIOGRAM N/A 02/20/2014   Procedure: LEFT HEART CATHETERIZATION WITH CORONARY ANGIOGRAM;  Surgeon: Peter M Martinique, MD;  Location: [facility] CATH LAB;  Service: Cardiovascular;  Laterality: N/A;  . TONSILLECTOMY    . TUBAL LIGATION  OB History   No obstetric history on file.      Home Medications    Prior to Admission medications   Medication Sig Start Date End Date Taking? Authorizing Provider  acetaminophen (TYLENOL) 500 MG tablet Take 500 mg by mouth every 6 (six) hours as needed for moderate pain.    [provider]  aspirin 81 MG tablet Take 81 mg by mouth daily.      [provider]  carvedilol (COREG CR) 40 MG 24 hr capsule TAKE 1 CAPSULE EVERY EVENING 09/11/18   Susy Frizzle, MD  ciprofloxacin (CIPRO) 500 MG tablet Take 1 tablet (500 mg total) by mouth 2 (two) times daily. 12/13/18   Klein, Modena Nunnery, MD  clindamycin (CLINDAGEL) 1 % gel  Apply 1 application topically daily as needed (infection).     [provider]  clopidogrel (PLAVIX) 75 MG tablet TAKE 1 TABLET DAILY 04/03/18   Susy Frizzle, MD  famotidine (PEPCID) 20 MG tablet Take 1 tablet (20 mg total) by mouth 2 (two) times daily. 07/05/18   Kennith Gain, MD  fexofenadine (ALLEGRA) 180 MG tablet Take 1 tablet (180 mg total) by mouth 2 (two) times daily. 07/05/18   Kennith Gain, MD  hydrochlorothiazide (MICROZIDE) 12.5 MG capsule Take 1 capsule (12.5 mg total) by mouth daily. 05/17/18   Barrett, Evelene Croon, PA-C  isosorbide mononitrate (IMDUR) 60 MG 24 hr tablet Take 1 tablet (60 mg total) by mouth daily. 04/13/18   Susy Frizzle, MD  Multiple Vitamins-Minerals (CENTRUM SILVER ADULT 50+ PO) Take 1 tablet by mouth daily.    [provider]  nitroGLYCERIN (NITROSTAT) 0.4 MG SL tablet DISSOLVE 1 TABLET UNDER THE TONGUE EVERY 5 MINUTES AS NEEDED FOR CHEST PAIN Patient not taking: Reported on 12/13/2018 05/17/18   Barrett, Evelene Croon, PA-C  predniSONE (DELTASONE) 20 MG tablet Take 20 mg by mouth daily as needed (Lip swelling).     [provider]  rosuvastatin (CRESTOR) 20 MG tablet Take 1 tablet (20 mg total) by mouth at bedtime. 04/28/18   Susy Frizzle, MD  SYNTHROID 50 MCG tablet TAKE 1 TABLET ALTERNATING WITH ONE AND ONE-HALF TABLETS AS DIRECTED Patient taking differently: Take 50-75 mcg by mouth daily. Take 50 mcg everyday except Mon, wed, and Fri take 75 mcg. 01/18/18   Elayne Snare, MD    Family History Family History  Problem Relation Age of Onset  . Allergies Father   . Cancer Father   . Coronary artery disease Mother 16  . Heart disease Mother   . CAD Sister 47  . Cancer Sister   . AAA (abdominal aortic aneurysm) Sister   . Hypertension Sister   . Hyperlipidemia Sister   . Diabetes Sister   . Heart disease Sister   . Diabetes Maternal Aunt     Social History Social History   Tobacco Use  . Smoking  status: Never Smoker  . Smokeless tobacco: Never Used  Substance Use Topics  . Alcohol use: No    Alcohol/week: 0.0 standard drinks  . Drug use: No     Allergies   Other; Adhesive [tape]; Codeine; Bactrim [sulfamethoxazole-trimethoprim]; Cephalexin; Lisinopril; Macrobid [nitrofurantoin monohyd macro]; and Tamiflu [oseltamivir phosphate]   Review of Systems Review of Systems  Constitutional: Negative for chills and fever.  HENT: Negative for facial swelling and sore throat.   Respiratory: Positive for shortness of breath.   Cardiovascular: Positive for palpitations. Negative for chest pain.  Gastrointestinal: Negative for abdominal  pain, nausea and vomiting.  Genitourinary: Negative for dysuria.  Musculoskeletal: Negative for back pain.  Skin: Positive for rash. Negative for wound.  Neurological: Negative for headaches.  Psychiatric/Behavioral: The patient is not nervous/anxious.      Physical Exam Updated Vital Signs BP 137/73 (BP Location: Left Arm)   Pulse 77   Temp 98.9 F (37.2 C)   Resp 18   Ht 5\' 7"  (1.702 m)   Wt 76.2 kg   SpO2 95%   BMI 26.31 kg/m   Physical Exam Vitals signs and nursing note reviewed.  Constitutional:      General: She is not in acute distress.    Appearance: She is well-developed. She is not diaphoretic.  HENT:     Head: Normocephalic and atraumatic.     Mouth/Throat:     Pharynx: No oropharyngeal exudate.     Comments: Patent airway, no edema noted to the lips, tongue, throat Eyes:     General: No scleral icterus.       Right eye: No discharge.        Left eye: No discharge.     Conjunctiva/sclera: Conjunctivae normal.     Pupils: Pupils are equal, round, and reactive to light.  Neck:     Musculoskeletal: Normal range of motion and neck supple.     Thyroid: No thyromegaly.  Cardiovascular:     Rate and Rhythm: Normal rate and regular rhythm.     Heart sounds: Normal heart sounds. No murmur. No friction rub. No gallop.     Pulmonary:     Effort: Pulmonary effort is normal. No respiratory distress.     Breath sounds: Normal breath sounds. No stridor. No wheezing or rales.  Abdominal:     General: Bowel sounds are normal. There is no distension.     Palpations: Abdomen is soft.     Tenderness: There is no abdominal tenderness. There is no guarding or rebound.  Lymphadenopathy:     Cervical: No cervical adenopathy.  Skin:    General: Skin is warm and dry.     Coloration: Skin is not pale.     Findings: No rash.     Comments: 3 erythematous round urticarial patches on chest; not present elsewhere, nontender  Neurological:     Mental Status: She is alert.     Coordination: Coordination normal.      ED Treatments / Results  Labs (all labs ordered are listed, but only abnormal results are displayed) Labs Reviewed - No data to display  EKG EKG Interpretation  Date/Time:  Thursday December 14 2018 07:41:33 EST Ventricular Rate:  75 PR Interval:    QRS Duration: 91 QT Interval:  407 QTC Calculation: 455 R Axis:   21 Text Interpretation:  Sinus rhythm Low voltage, precordial leads No significant change since last tracing Confirmed by Virgel Manifold [phone]) on 12/14/2018 8:12:47 AM   Radiology No results found.  Procedures Procedures (including critical care time)  Medications Ordered in ED Medications  fosfomycin (MONUROL) packet 3 g (has no administration in time range)  diphenhydrAMINE (BENADRYL) capsule 25 mg (25 mg Oral Given 12/14/18 0803)  famotidine (PEPCID) tablet 20 mg (20 mg Oral Given 12/14/18 0803)     Initial Impression / Assessment and Plan / ED Course  I have reviewed the triage vital signs and the nursing notes.  Pertinent labs & imaging results that were available during my care of the patient were reviewed by me and considered in my medical decision making (see  chart for details).     Patient presenting with rash to her chest.  This may be related to Cipro, however on  sure.  Rash is fading after Benadryl and Pepcid.  Patient still mildly itchy.  No shortness of breath.  EKG shows NSR and no tachycardia.  Patient reports her shortness of breath and heart racing sensation has resolved.  Will stop Cipro for now and give fosfomycin single dose.  Patient vies to talk to her doctor about this considering she has several other allergies to antibiotics gets recurrent UTI.  Will discharge home with advice to take Benadryl and Pepcid for the next few days.  Return precautions discussed.  Patient understands and agrees with plan.  Patient vitals stable throughout ED course and discharged in satisfactory condition. I discussed patient case with Dr. Wilson Singer who guided the patient's management and agrees with plan.   Final Clinical Impressions(s) / ED Diagnoses   Final diagnoses:  Rash  Allergic reaction, initial encounter    ED Discharge Orders    None       Frederica Kuster, PA-C 12/14/18 0857    Virgel Manifold, MD 12/15/18 [phone]

## 2018-12-14 NOTE — Discharge Instructions (Signed)
Continue taking your Allegra or Benadryl as prescribed.  Take Pepcid 1-2 times daily for the next few days.  Please follow-up with your doctor by calling them today or tomorrow to make them aware of your visit today.  Please tell them you received fosfomycin in the emergency department.  You may need to wait on your urine culture.  They will need to determine alternative antibiotics for you considering you may be allergic to Cipro now.  Please return to the emergency department you develop any new or worsening symptoms including difficulty breathing, swelling of your lips, tongue, throat.

## 2018-12-15 LAB — URINE CULTURE
MICRO NUMBER:: 89119
SPECIMEN QUALITY:: ADEQUATE

## 2019-01-03 ENCOUNTER — Telehealth: Payer: Self-pay | Admitting: Family Medicine

## 2019-01-03 ENCOUNTER — Encounter: Payer: Self-pay | Admitting: Family Medicine

## 2019-01-03 ENCOUNTER — Other Ambulatory Visit: Payer: Medicare Other

## 2019-01-03 DIAGNOSIS — N390 Urinary tract infection, site not specified: Secondary | ICD-10-CM

## 2019-01-03 LAB — URINALYSIS, ROUTINE W REFLEX MICROSCOPIC
Bacteria, UA: NONE SEEN /HPF
Bilirubin Urine: NEGATIVE
Glucose, UA: NEGATIVE
Hgb urine dipstick: NEGATIVE
Ketones, ur: NEGATIVE
Nitrite: NEGATIVE
Protein, ur: NEGATIVE
RBC / HPF: NONE SEEN /HPF (ref 0–2)
Specific Gravity, Urine: 1.015 (ref 1.001–1.03)
pH: 7.5 (ref 5.0–8.0)

## 2019-01-03 LAB — MICROSCOPIC MESSAGE

## 2019-01-03 NOTE — Telephone Encounter (Signed)
Allergy list updates

## 2019-01-03 NOTE — Telephone Encounter (Signed)
Pt needs [place] to add cipro as an allergy for her, it causes her to break out in hives all over her body. She went to the ER for this and they gave her fosfomycin tromethamine 3g po pack, for the uti and it has helped.   Just FYI.

## 2019-01-04 LAB — URINE CULTURE
MICRO NUMBER:: 186040
SPECIMEN QUALITY:: ADEQUATE

## 2019-01-05 ENCOUNTER — Ambulatory Visit (INDEPENDENT_AMBULATORY_CARE_PROVIDER_SITE_OTHER): Payer: Medicare Other | Admitting: Allergy

## 2019-01-05 ENCOUNTER — Encounter: Payer: Self-pay | Admitting: Allergy

## 2019-01-05 VITALS — BP 120/80 | HR 70 | Resp 16 | Ht 67.0 in | Wt 169.0 lb

## 2019-01-05 DIAGNOSIS — T783XXD Angioneurotic edema, subsequent encounter: Secondary | ICD-10-CM | POA: Diagnosis not present

## 2019-01-05 MED ORDER — FEXOFENADINE HCL 180 MG PO TABS: 180.0000 mg | ORAL_TABLET | Freq: Two times a day (BID) | ORAL | 2 refills | Status: DC

## 2019-01-05 MED ORDER — AMOXICILLIN 500 MG PO CAPS: 500.0000 mg | ORAL_CAPSULE | Freq: Two times a day (BID) | ORAL | 0 refills | Status: DC

## 2019-01-05 MED ORDER — FAMOTIDINE 20 MG PO TABS: 20.0000 mg | ORAL_TABLET | Freq: Two times a day (BID) | ORAL | 2 refills | Status: DC

## 2019-01-05 NOTE — Progress Notes (Signed)
Follow-up Note  RE: Lindsey Erickson MRN: [id number] DOB: Dec 14, 1931 Date of Office Visit: 01/05/2019   History of present illness: Lindsey Erickson is a 85 y.o. female presenting today for follow-up of angioedema.  She was last seen in the office for follow-up on 07/05/18 by myself.  After this visit I recommended that she start weaning her antihistamines as she has been doing well without swelling episodes. However she states she has had 2 lip swelling episodes since.  On 11/25 she has lower lip swelling and states she woke up with the swelling.  She took 1 allegra and 1 pepcid and states the she also took 20mg  prednisone and had resolution of the swelling.  On 11/06/18 after eating a spicy spinach dip she developed lower lip swelling.  She took Human resources officer and pepcid after eating and then another allegra and pepcid prior to bed.  She also states she took 20mg  prednisone the next day as she still has some residual lip swelling.  She has never had any respiratory distress rash or itch with the lip swelling.    She is concerned that the spices in the dip may have triggered.  She also states it has happened with pizza with pizza sauce with spices.    She states after the 11/06/18 swelling she started back taking her allegra and pepcid 1 tab twice a day and hasn't had any further swelling since.   On 12/14/18 she took dose of Cipro for UTI and states the night or maybe the next day she had developed an itchy, red splotchy rash on her chest.  She also states her BP was elevated.  She states she was concerned that she may develop lip swelling thus she went to the ED.   She had EKG done with sinus rhythm with no significant change since last reading.  She was given fosfomycin, benadryl and pepcid.     Review of systems: Review of Systems  Constitutional: Negative for chills, fever and malaise/fatigue.  HENT: Negative for congestion, ear discharge, nosebleeds and sore throat.   Eyes: Negative for pain,  discharge and redness.  Respiratory: Negative for cough, shortness of breath and wheezing.   Cardiovascular: Negative for chest pain.  Gastrointestinal: Negative for abdominal pain, constipation, diarrhea, heartburn, nausea and vomiting.  Musculoskeletal: Negative for joint pain.  Skin: Negative for itching and rash.  Neurological: Negative for headaches.    All other systems negative unless noted above in HPI  Past medical/social/surgical/family history have been reviewed and are unchanged unless specifically indicated below.  No changes  Medication List: Allergies as of 01/05/2019      Reactions   Ciprofloxacin Hives   Other Other (See Comments)   Frozen Blood Plasma-caused patient to crash / cardiac arrest / CAN NEVER TAKE THIS AGAIN-PER MD'S.  ALL NARCOTICS- NAUSEA AND VOMITING    Adhesive [tape] Itching, Swelling, Rash, Other (See Comments)   Also pain at location   Codeine Nausea And Vomiting   Bactrim [sulfamethoxazole-trimethoprim]    Muscle aches   Cephalexin Hives   Lisinopril Swelling   Macrobid [nitrofurantoin Monohyd Macro] Nausea And Vomiting   Tamiflu [oseltamivir Phosphate]       Medication List       Accurate as of January 05, 2019  1:52 PM. Always use your most recent med list.        acetaminophen 500 MG tablet Commonly known as:  TYLENOL Take 500 mg by mouth every 6 (six) hours as needed for  moderate pain.   aspirin 81 MG tablet Take 81 mg by mouth daily.   carvedilol 40 MG 24 hr capsule Commonly known as:  COREG CR TAKE 1 CAPSULE EVERY EVENING   CENTRUM SILVER ADULT 50+ PO Take 1 tablet by mouth daily.   clindamycin 1 % gel Commonly known as:  CLINDAGEL Apply 1 application topically daily as needed (infection).   clopidogrel 75 MG tablet Commonly known as:  PLAVIX TAKE 1 TABLET DAILY   famotidine 20 MG tablet Commonly known as:  PEPCID Take 1 tablet (20 mg total) by mouth 2 (two) times daily.   fexofenadine 180 MG tablet Commonly  known as:  ALLEGRA Take 1 tablet (180 mg total) by mouth 2 (two) times daily.   hydrochlorothiazide 12.5 MG capsule Commonly known as:  MICROZIDE Take 1 capsule (12.5 mg total) by mouth daily.   isosorbide mononitrate 60 MG 24 hr tablet Commonly known as:  IMDUR Take 1 tablet (60 mg total) by mouth daily.   nitroGLYCERIN 0.4 MG SL tablet Commonly known as:  NITROSTAT DISSOLVE 1 TABLET UNDER THE TONGUE EVERY 5 MINUTES AS NEEDED FOR CHEST PAIN   predniSONE 20 MG tablet Commonly known as:  DELTASONE Take 20 mg by mouth daily as needed (Lip swelling).   rosuvastatin 20 MG tablet Commonly known as:  CRESTOR Take 1 tablet (20 mg total) by mouth at bedtime.   SYNTHROID 50 MCG tablet Generic drug:  levothyroxine TAKE 1 TABLET ALTERNATING WITH ONE AND ONE-HALF TABLETS AS DIRECTED       Known medication allergies: Allergies  Allergen Reactions  . Ciprofloxacin Hives  . 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   ALL NARCOTICS- NAUSEA AND VOMITING   . Adhesive [Tape] Itching, Swelling, Rash and Other (See Comments)    Also pain at location  . Codeine Nausea And Vomiting  . Bactrim [Sulfamethoxazole-Trimethoprim]     Muscle aches  . Cephalexin Hives  . Lisinopril Swelling  . Macrobid [Nitrofurantoin Monohyd Macro] Nausea And Vomiting  . Tamiflu [Oseltamivir Phosphate]      Physical examination: Blood pressure 120/80, pulse 70, resp. rate 16, height 5\' 7"  (1.702 m), weight 169 lb (76.7 kg), SpO2 96 %.  General: Alert, interactive, in no acute distress. HEENT: PERRLA, TMs pearly gray, turbinates non-edematous without discharge, post-pharynx non erythematous. Neck: Supple without lymphadenopathy. Lungs: Clear to auscultation without wheezing, rhonchi or rales. {no increased work of breathing. CV: Normal S1, S2 without murmurs. Abdomen: Nondistended, nontender. Skin: Warm and dry, without  lesions or rashes. Extremities:  No clubbing, cyanosis or edema. Neuro:   Grossly intact.  Diagnositics/Labs: None  Assessment and plan:   Idiopathic Angioedema (lip swelling)     - episodic angioedema can be due to variety of different triggers.  ACE-I (lisinopril) is a known medication that can cause angioedema of the oral cavity.   The swelling can continue for months even despite stopping the medication.  This category of medications should not be taken again in the future.   Labs for swelling were largely unremarkable except for low level sensitivity to elm tree pollen.      - recommend taking a high-dose antihistamine regimen to help reduce/prevent swelling episodes: take Allegra 180mg  twice daily and  Pepcid 20mg  twice a day.         - if you are free of swelling episodes throughout all of March then April 1st start  weaning schedule:  stop evening dose of Pepcid x 2 weeks then if no swelling episodes stop the morning dose of Pedpid x 2 weeks then if no swelling episodes stop the evening dose of Allegra x 2 weeks then stop and continue on the morning Allegra dose.       - may use prednisone 20mg  for acute onset of lip swelling if occurs again in future as needed  Follow-up 3-4 months or sooner if needed     I appreciate the opportunity to take part in [place] care. Please do not hesitate to contact me with questions.  Sincerely,   Prudy Feeler, MD Allergy/Immunology [facility]

## 2019-01-05 NOTE — Patient Instructions (Signed)
Idiopathic Angioedema (lip swelling)     - episodic angioedema can be due to variety of different triggers.  ACE-I (lisinopril) is a known medication that can cause angioedema of the oral cavity.   The swelling can continue for months even despite stopping the medication.  This category of medications should not be taken again in the future.   Labs for swelling were largely unremarkable except for low level sensitivity to elm tree pollen.      - recommend taking a high-dose antihistamine regimen to help reduce/prevent swelling episodes: take Allegra 180mg  twice daily and  Pepcid 20mg  twice a day.         - if you are free of swelling episodes throughout all of March then April 1st start weaning schedule:  stop evening dose of Pepcid x 2 weeks then if no swelling episodes stop the morning dose of Pedpid x 2 weeks then if no swelling episodes stop the evening dose of Allegra x 2 weeks then stop and continue on the morning Allegra dose.       - may use prednisone 20mg  for acute onset of lip swelling if occurs again in future as needed  Follow-up 3-4 months or sooner if needed

## 2019-01-13 ENCOUNTER — Other Ambulatory Visit: Payer: Self-pay | Admitting: Endocrinology

## 2019-03-13 ENCOUNTER — Ambulatory Visit: Payer: Medicare Other | Admitting: Cardiovascular Disease

## 2019-03-15 ENCOUNTER — Encounter: Payer: Self-pay | Admitting: Family Medicine

## 2019-03-15 ENCOUNTER — Other Ambulatory Visit: Payer: Self-pay

## 2019-03-15 ENCOUNTER — Ambulatory Visit (INDEPENDENT_AMBULATORY_CARE_PROVIDER_SITE_OTHER): Payer: Medicare Other | Admitting: Family Medicine

## 2019-03-15 VITALS — BP 136/82 | HR 80 | Temp 98.3°F | Resp 18 | Ht 67.0 in | Wt 170.0 lb

## 2019-03-15 DIAGNOSIS — N390 Urinary tract infection, site not specified: Secondary | ICD-10-CM | POA: Diagnosis not present

## 2019-03-15 DIAGNOSIS — R319 Hematuria, unspecified: Secondary | ICD-10-CM

## 2019-03-15 LAB — URINALYSIS, ROUTINE W REFLEX MICROSCOPIC
Bilirubin Urine: NEGATIVE
Glucose, UA: NEGATIVE
Hyaline Cast: NONE SEEN /LPF
Ketones, ur: NEGATIVE
Nitrite: NEGATIVE
Specific Gravity, Urine: 1.025 (ref 1.001–1.03)
WBC, UA: >=60 /HPF — AB (ref 0–5)
pH: 6 (ref 5.0–8.0)

## 2019-03-15 LAB — MICROSCOPIC MESSAGE

## 2019-03-15 MED ORDER — AMPICILLIN 500 MG PO CAPS: 500.0000 mg | ORAL_CAPSULE | Freq: Four times a day (QID) | ORAL | 0 refills | Status: AC

## 2019-03-15 MED ORDER — PHENAZOPYRIDINE HCL 100 MG PO TABS: 100.0000 mg | ORAL_TABLET | Freq: Three times a day (TID) | ORAL | 0 refills | Status: DC | PRN

## 2019-03-15 NOTE — Progress Notes (Signed)
Patient ID: Lindsey Erickson, female    DOB: 03-24-1932, 85 y.o.   MRN: [id number]  PCP: Susy Frizzle, MD  Chief Complaint  Patient presents with  . Urinary Tract Infection    burning, polyuria, started 04/22    Subjective:   Lindsey Erickson is a 85 y.o. female, presents to clinic with CC of dysuria and hx of frequent UTI's.  Sx started yesterday with urinary frequency, urgency, dysuria described as burning with urination and also today feels like its shooting up her urethra towards bladder.  She denies urine odor, hematuria, abd pain, flank pain, fever, chills, sweats, N, change in appetite, vaginal sx.   Last UTI was 3 months ago, treated with cipro - she developed rash to neck and face and she went to the ER for allergic rxn and UTI was successfully treated with fosfomycin.  She has many other med allergies.  She reports getting at least 3-4 UTI's per year, last was due to [facility], was pansensitive    Patient Active Problem List   Diagnosis Date Noted  . Atypical chest pain 05/03/2018  . Angio-edema   . Squamous cell carcinoma of skin 04/17/2015  . Cystitis 08/03/2014  . NSTEMI (non-ST elevated myocardial infarction) ([place]) 02/21/2014  . QT prolongation 02/21/2014  . CAD (coronary artery disease)   . Hyperlipidemia   . Multinodular goiter 10/03/2013  . Osteoarthritis of right knee   . Osteoporosis with fracture hx 02/15/2011  . Hypothyroid 02/15/2011  . Kidney stones   . Essential hypertension 06/22/2010  . Other diseases of lung, not elsewhere classified 02/25/2010     Prior to Admission medications   Medication Sig Start Date End Date Taking? Authorizing Provider  acetaminophen (TYLENOL) 500 MG tablet Take 500 mg by mouth every 6 (six) hours as needed for moderate pain.   Yes [provider]  amoxicillin (AMOXIL) 500 MG capsule Take 1 capsule (500 mg total) by mouth 2 (two) times daily. 01/05/19  Yes [place], Modena Nunnery, MD  aspirin 81 MG tablet Take 81 mg  by mouth daily.     Yes [provider]  carvedilol (COREG CR) 40 MG 24 hr capsule TAKE 1 CAPSULE EVERY EVENING 09/11/18  Yes Susy Frizzle, MD  clindamycin (CLINDAGEL) 1 % gel Apply 1 application topically daily as needed (infection).    Yes [provider]  clopidogrel (PLAVIX) 75 MG tablet TAKE 1 TABLET DAILY 04/03/18  Yes Susy Frizzle, MD  famotidine (PEPCID) 20 MG tablet Take 1 tablet (20 mg total) by mouth 2 (two) times daily. 01/05/19  Yes Padgett, Rae Halsted, MD  fexofenadine (ALLEGRA) 180 MG tablet Take 1 tablet (180 mg total) by mouth 2 (two) times daily. 01/05/19  Yes Padgett, Rae Halsted, MD  hydrochlorothiazide (MICROZIDE) 12.5 MG capsule Take 1 capsule (12.5 mg total) by mouth daily. 05/17/18  Yes Barrett, Evelene Croon, PA-C  isosorbide mononitrate (IMDUR) 60 MG 24 hr tablet Take 1 tablet (60 mg total) by mouth daily. 04/13/18  Yes Susy Frizzle, MD  Multiple Vitamins-Minerals (CENTRUM SILVER ADULT 50+ PO) Take 1 tablet by mouth daily.   Yes [provider]  nitroGLYCERIN (NITROSTAT) 0.4 MG SL tablet DISSOLVE 1 TABLET UNDER THE TONGUE EVERY 5 MINUTES AS NEEDED FOR CHEST PAIN 05/17/18  Yes Barrett, Evelene Croon, PA-C  predniSONE (DELTASONE) 20 MG tablet Take 20 mg by mouth daily as needed (Lip swelling).    Yes [provider]  rosuvastatin (CRESTOR) 20 MG tablet Take  1 tablet (20 mg total) by mouth at bedtime. 04/28/18  Yes Susy Frizzle, MD  SYNTHROID 50 MCG tablet TAKE 1 TABLET ALTERNATING WITH ONE AND ONE-HALF TABLETS AS DIRECTED 01/14/19  Yes Elayne Snare, MD     Allergies  Allergen Reactions  . Ciprofloxacin Hives  . 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   ALL NARCOTICS- NAUSEA AND VOMITING   . Adhesive [Tape] Itching, Swelling, Rash and Other (See Comments)    Also pain at location  . Codeine Nausea And Vomiting  . Bactrim  [Sulfamethoxazole-Trimethoprim]     Muscle aches  . Cephalexin Hives  . Lisinopril Swelling  . Macrobid [Nitrofurantoin Monohyd Macro] Nausea And Vomiting  . Tamiflu [Oseltamivir Phosphate]      Family History  Problem Relation Age of Onset  . Allergies Father   . Cancer Father   . Coronary artery disease Mother 40  . Heart disease Mother   . CAD Sister 56  . Cancer Sister   . AAA (abdominal aortic aneurysm) Sister   . Hypertension Sister   . Hyperlipidemia Sister   . Diabetes Sister   . Heart disease Sister   . Diabetes Maternal Aunt   . Allergic rhinitis Neg Hx   . Asthma Neg Hx   . Eczema Neg Hx   . Urticaria Neg Hx      Social History   Socioeconomic History  . Marital status: Married    Spouse name: Not on file  . Number of children: Not on file  . Years of education: Not on file  . Highest education level: Not on file  Occupational History  . Not on file  Social Needs  . Financial resource strain: Not on file  . Food insecurity:    Worry: Not on file    Inability: Not on file  . Transportation needs:    Medical: Not on file    Non-medical: Not on file  Tobacco Use  . Smoking status: Never Smoker  . Smokeless tobacco: Never Used  Substance and Sexual Activity  . Alcohol use: No    Alcohol/week: 0.0 standard drinks  . Drug use: No  . Sexual activity: Not Currently  Lifestyle  . Physical activity:    Days per week: Not on file    Minutes per session: Not on file  . Stress: Not on file  Relationships  . Social connections:    Talks on phone: Not on file    Gets together: Not on file    Attends religious service: Not on file    Active member of club or organization: Not on file    Attends meetings of clubs or organizations: Not on file    Relationship status: Not on file  . Intimate partner violence:    Fear of current or ex partner: Not on file    Emotionally abused: Not on file    Physically abused: Not on file    Forced sexual activity: Not  on file  Other Topics Concern  . Not on file  Social History Narrative   Married, 2 children. Retired from [employer]  Constitutional: Negative.   HENT: Negative.   Eyes: Negative.   Respiratory: Negative.   Cardiovascular: Negative.   Gastrointestinal: Negative.   Endocrine: Negative.   Genitourinary: Negative.   Musculoskeletal: Negative.   Skin: Negative.   Allergic/Immunologic:  Negative.   Neurological: Negative.   Hematological: Negative.   Psychiatric/Behavioral: Negative.   All other systems reviewed and are negative.      Objective:    Vitals:   03/15/19 1057  BP: 136/82  Pulse: 80  Resp: 18  Temp: 98.3 F (36.8 C)  SpO2: 98%  Weight: 170 lb (77.1 kg)  Height: 5\' 7"  (1.702 m)      Physical Exam Vitals signs and nursing note reviewed.  Constitutional:      General: She is not in acute distress.    Appearance: Normal appearance. She is well-developed. She is not ill-appearing, toxic-appearing or diaphoretic.     Comments: Well appearing elderly female, wearing mask and gloves  HENT:     Head: Normocephalic and atraumatic.     Right Ear: External ear normal.     Left Ear: External ear normal.     Mouth/Throat:     Mouth: Mucous membranes are moist.     Pharynx: Oropharynx is clear. No oropharyngeal exudate or posterior oropharyngeal erythema.  Eyes:     General: No scleral icterus.       Right eye: No discharge.        Left eye: No discharge.     Conjunctiva/sclera: Conjunctivae normal.  Neck:     Trachea: No tracheal deviation.  Cardiovascular:     Rate and Rhythm: Normal rate and regular rhythm.     Pulses: Normal pulses.     Heart sounds: Normal heart sounds. No murmur. No friction rub. No gallop.   Pulmonary:     Effort: Pulmonary effort is normal. No respiratory distress.     Breath sounds: Normal breath sounds. No stridor. No wheezing, rhonchi or rales.  Chest:     Chest wall: No tenderness.  Abdominal:      General: Abdomen is flat. Bowel sounds are normal. There is no distension.     Palpations: Abdomen is soft.     Tenderness: There is no abdominal tenderness. There is no right CVA tenderness, left CVA tenderness, guarding or rebound.  Skin:    General: Skin is warm and dry.     Findings: No rash.  Neurological:     Mental Status: She is alert.     Motor: No abnormal muscle tone.     Coordination: Coordination normal.     Gait: Gait normal.  Psychiatric:        Behavior: Behavior normal.     Results for orders placed or performed in visit on 03/15/19  Urinalysis, Routine w reflex microscopic  Result Value Ref Range   Color, Urine YELLOW YELLOW   APPearance CLEAR CLEAR   Specific Gravity, Urine 1.025 1.001 - 1.03   pH 6.0 5.0 - 8.0   Glucose, UA NEGATIVE NEGATIVE   Bilirubin Urine NEGATIVE NEGATIVE   Ketones, ur NEGATIVE NEGATIVE   Hgb urine dipstick 1+ (A) NEGATIVE   Protein, ur TRACE (A) NEGATIVE   Nitrite NEGATIVE NEGATIVE   Leukocytes,Ua 2+ (A) NEGATIVE   WBC, UA > OR = 60 (A) 0 - 5 /HPF   RBC / HPF 10-20 (A) 0 - 2 /HPF   Squamous Epithelial / LPF 0-5 < OR = 5 /HPF   Bacteria, UA FEW (A) NONE SEEN /HPF   Hyaline Cast NONE SEEN NONE SEEN /LPF  Microscopic Message  Result Value Ref Range   Note           Assessment & Plan:   Elderly female with UTI sx, hx  of same Urine +, urine culture added, looked at last C&S and med allergy list - pt noted that she tolerates ampicillin, with tx with that and wait for new C&S  Offered pt referral to urology if desired since she has many UTI's a year, she will wait for now.    ICD-10-CM   1. Urinary tract infection with hematuria, site unspecified N39.0 Urinalysis, Routine w reflex microscopic   R31.9 Urine Culture    Microscopic Message    ampicillin (PRINCIPEN) 500 MG capsule    phenazopyridine (PYRIDIUM) 100 MG tablet  2. Recurrent UTI N39.0 Urine Culture    Pt well appearing, VSS, no constitutional sx, abdominal exam  benign, at this time no concern at all for pyelo or complicated UTI   Delsa Grana, PA-C 03/15/19 11:07 AM

## 2019-03-15 NOTE — Patient Instructions (Addendum)
PAGER # in case you need it [phone]  When you call it will beep several times Type your call back number into your phone - just numbers like: [phone] And when done press # button and hang up  If you aren't sure if you did it correctly - call again and retry  Your page will be returned from someones personal phone - often we block the caller ID, so make sure you can accept blocked calls.  Page only with urgent issues that need to be addressed in the next couple hours.  General questions, medication refills, etc are not appropriate for paging after hours.  Please call the office or send a message through [place] and we will usually get back to you in 24 hours.      Stay hydrated, take antibiotics Call ASAP if any worsening  WE will call you with urine culture results   Urinary Tract Infection, Adult A urinary tract infection (UTI) is an infection of any part of the urinary tract. The urinary tract includes:  The kidneys.  The ureters.  The bladder.  The urethra. These organs make, store, and get rid of pee (urine) in the body. What are the causes? This is caused by germs (bacteria) in your genital area. These germs grow and cause swelling (inflammation) of your urinary tract. What increases the risk? You are more likely to develop this condition if:  You have a small, thin tube (catheter) to drain pee.  You cannot control when you pee or poop (incontinence).  You are female, and: ? You use these methods to prevent pregnancy: ? A medicine that kills sperm (spermicide). ? A device that blocks sperm (diaphragm). ? You have low levels of a female hormone (estrogen). ? You are pregnant.  You have genes that add to your risk.  You are sexually active.  You take antibiotic medicines.  You have trouble peeing because of: ? A prostate that is bigger than normal, if you are female. ? A blockage in the part of your body that drains pee from the bladder (urethra).  ? A kidney stone. ? A nerve condition that affects your bladder (neurogenic bladder). ? Not getting enough to drink. ? Not peeing often enough.  You have other conditions, such as: ? Diabetes. ? A weak disease-fighting system (immune system). ? Sickle cell disease. ? Gout. ? Injury of the spine. What are the signs or symptoms? Symptoms of this condition include:  Needing to pee right away (urgently).  Peeing often.  Peeing small amounts often.  Pain or burning when peeing.  Blood in the pee.  Pee that smells bad or not like normal.  Trouble peeing.  Pee that is cloudy.  Fluid coming from the vagina, if you are female.  Pain in the belly or lower back. Other symptoms include:  Throwing up (vomiting).  No urge to eat.  Feeling mixed up (confused).  Being tired and grouchy (irritable).  A fever.  Watery poop (diarrhea). How is this treated? This condition may be treated with:  Antibiotic medicine.  Other medicines.  Drinking enough water. Follow these instructions at home:  Medicines  Take over-the-counter and prescription medicines only as told by your doctor.  If you were prescribed an antibiotic medicine, take it as told by your doctor. Do not stop taking it even if you start to feel better. General instructions  Make sure you: ? Pee until your bladder is empty. ? Do not hold pee for a long time. ?  Empty your bladder after sex. ? Wipe from front to back after pooping if you are a female. Use each tissue one time when you wipe.  Drink enough fluid to keep your pee pale yellow.  Keep all follow-up visits as told by your doctor. This is important. Contact a doctor if:  You do not get better after 1-2 days.  Your symptoms go away and then come back. Get help right away if:  You have very bad back pain.  You have very bad pain in your lower belly.  You have a fever.  You are sick to your stomach (nauseous).  You are throwing up.  Summary  A urinary tract infection (UTI) is an infection of any part of the urinary tract.  This condition is caused by germs in your genital area.  There are many risk factors for a UTI. These include having a small, thin tube to drain pee and not being able to control when you pee or poop.  Treatment includes antibiotic medicines for germs.  Drink enough fluid to keep your pee pale yellow. This information is not intended to replace advice given to you by your health care provider. Make sure you discuss any questions you have with your health care provider. Document Released: 04/26/2008 Document Revised: 05/18/2018 Document Reviewed: 05/18/2018 Elsevier Interactive Patient Education  2019 [employer].

## 2019-03-16 ENCOUNTER — Telehealth: Payer: Self-pay | Admitting: Cardiovascular Disease

## 2019-03-16 NOTE — Telephone Encounter (Signed)
LVM to schedule

## 2019-03-17 LAB — URINE CULTURE
MICRO NUMBER:: 416939
SPECIMEN QUALITY:: ADEQUATE

## 2019-03-22 ENCOUNTER — Ambulatory Visit (INDEPENDENT_AMBULATORY_CARE_PROVIDER_SITE_OTHER): Payer: Medicare Other | Admitting: Family Medicine

## 2019-03-22 ENCOUNTER — Other Ambulatory Visit: Payer: Self-pay

## 2019-03-22 ENCOUNTER — Encounter: Payer: Self-pay | Admitting: Family Medicine

## 2019-03-22 VITALS — BP 150/78 | HR 80 | Temp 98.9°F | Resp 16 | Ht 67.0 in | Wt 170.0 lb

## 2019-03-22 DIAGNOSIS — I872 Venous insufficiency (chronic) (peripheral): Secondary | ICD-10-CM | POA: Diagnosis not present

## 2019-03-22 DIAGNOSIS — N390 Urinary tract infection, site not specified: Secondary | ICD-10-CM | POA: Diagnosis not present

## 2019-03-22 DIAGNOSIS — R319 Hematuria, unspecified: Secondary | ICD-10-CM | POA: Diagnosis not present

## 2019-03-22 LAB — URINALYSIS, ROUTINE W REFLEX MICROSCOPIC
Bacteria, UA: NONE SEEN /HPF
Bilirubin Urine: NEGATIVE
Glucose, UA: NEGATIVE
Hgb urine dipstick: NEGATIVE
Hyaline Cast: NONE SEEN /LPF
Ketones, ur: NEGATIVE
Nitrite: NEGATIVE
Protein, ur: NEGATIVE
Specific Gravity, Urine: 1.025 (ref 1.001–1.03)
pH: 6 (ref 5.0–8.0)

## 2019-03-22 LAB — MICROSCOPIC MESSAGE

## 2019-03-22 MED ORDER — MOMETASONE FUROATE 0.1 % EX CREA: 1.0000 "application " | TOPICAL_CREAM | Freq: Every day | CUTANEOUS | 0 refills | Status: DC

## 2019-03-22 NOTE — Progress Notes (Signed)
Subjective:    Patient ID: Lindsey Erickson, female    DOB: 15-Mar-1932, 85 y.o.   MRN: [id number]  HPI Patient presents today with a red rash on the anterior surface of both hands.  On her right shin it is poorly circumscribed.  It is a circular-like patch that is approximately 11 cm in diameter but there is not a sharp border.  The skin there is slightly raised and indurated.  It is pink and red in appearance however it is not painful.  It does not itch.  It is not warm to the touch.  The patient states that is occurred 2 or 3 different times in that exact same area and it comes and goes on its own.  She does have varicose veins and swelling in her legs.  This does have an eczema-like appearance is inside the red patch or several coalescent small papules.  She has a smaller patch roughly the size of a silver dollar on the anterior surface of her left shin.  She denies any tick bites or puncture wounds in that area.  The redness is not spreading.  She has had it here for the last 2 weeks.  Again she has had 2 or 3 different times similarly in that area over the last year ever since she developed varicose veins.  She also wants to recheck her urinalysis after being treated for urinary tract infection.  There is trace leukocyte esterase today but she is completely asymptomatic Past Medical History:  Diagnosis Date  . Anemia   . Angio-edema   . Arthritis   . Blood transfusion without reported diagnosis 1957  . CAD (coronary artery disease)    a. NSTEMI 2004 with thrombus in Cx and mod RCA. b. Nonischemic nuc 2008. c. mild NSTEMI 01/2013 (trop 0.42) -> cath with unchanged mod LAD & RCA disease, for continued medical therapy.  . Cancer ([place])   . Cataract   . H/O Hashimoto thyroiditis   . Hyperlipidemia   . HYPERTENSION    a. Also has h/o intermittentl low BP.  Marland Kitchen Hypothyroidism   . Kidney stones   . Myocardial infarction ([place])   . Osteoporosis    no fractures  . Prediabetes   . QT prolongation     a. Prior EKGs QTc 474, but on 4/1 was 524 for unclear reason.  . Recurrent urinary tract infection   . Squamous cell carcinoma of skin 04/17/2015  . Urticaria    Past Surgical History:  Procedure Laterality Date  . ADENOIDECTOMY    . APPENDECTOMY  1999  . Bilateral Arthroscopies of knees    . CARDIAC CATHETERIZATION     Moderate 3 vessel CAD by cath 05/2012  . CHOLECYSTECTOMY  02/2010  . JOINT REPLACEMENT  01/2010   L TKR - allusio  . LEFT HEART CATHETERIZATION WITH CORONARY ANGIOGRAM N/A 05/22/2012   Procedure: LEFT HEART CATHETERIZATION WITH CORONARY ANGIOGRAM;  Surgeon: Sinclair Grooms, MD;  Location[facility] CATH LAB;  Service: Cardiovascular;  Laterality: N/A;  . LEFT HEART CATHETERIZATION WITH CORONARY ANGIOGRAM N/A 02/20/2014   Procedure: LEFT HEART CATHETERIZATION WITH CORONARY ANGIOGRAM;  Surgeon: Peter M Martinique, MD;  Location: [facility] CATH LAB;  Service: Cardiovascular;  Laterality: N/A;  . TONSILLECTOMY    . TUBAL LIGATION     Current Outpatient Medications on File Prior to Visit  Medication Sig Dispense Refill  . acetaminophen (TYLENOL) 500 MG tablet Take 500 mg by mouth every 6 (six) hours as needed for  moderate pain.    Marland Kitchen aspirin 81 MG tablet Take 81 mg by mouth daily.      . carvedilol (COREG CR) 40 MG 24 hr capsule TAKE 1 CAPSULE EVERY EVENING 90 capsule 4  . clindamycin (CLINDAGEL) 1 % gel Apply 1 application topically daily as needed (infection).     . clopidogrel (PLAVIX) 75 MG tablet TAKE 1 TABLET DAILY 90 tablet 3  . famotidine (PEPCID) 20 MG tablet Take 1 tablet (20 mg total) by mouth 2 (two) times daily. 180 tablet 2  . fexofenadine (ALLEGRA) 180 MG tablet Take 1 tablet (180 mg total) by mouth 2 (two) times daily. 180 tablet 2  . hydrochlorothiazide (MICROZIDE) 12.5 MG capsule Take 1 capsule (12.5 mg total) by mouth daily. 90 capsule 3  . isosorbide mononitrate (IMDUR) 60 MG 24 hr tablet Take 1 tablet (60 mg total) by mouth daily. 90 tablet 3  . Multiple Vitamins-Minerals  (CENTRUM SILVER ADULT 50+ PO) Take 1 tablet by mouth daily.    . nitroGLYCERIN (NITROSTAT) 0.4 MG SL tablet DISSOLVE 1 TABLET UNDER THE TONGUE EVERY 5 MINUTES AS NEEDED FOR CHEST PAIN 25 tablet 4  . phenazopyridine (PYRIDIUM) 100 MG tablet Take 1 tablet (100 mg total) by mouth 3 (three) times daily as needed for pain. 10 tablet 0  . predniSONE (DELTASONE) 20 MG tablet Take 20 mg by mouth daily as needed (Lip swelling).     . rosuvastatin (CRESTOR) 20 MG tablet Take 1 tablet (20 mg total) by mouth at bedtime. 90 tablet 3  . SYNTHROID 50 MCG tablet TAKE 1 TABLET ALTERNATING WITH ONE AND ONE-HALF TABLETS AS DIRECTED 108 tablet 4   No current facility-administered medications on file prior to visit.    Allergies  Allergen Reactions  . Ciprofloxacin Hives  . 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   ALL NARCOTICS- NAUSEA AND VOMITING   . Adhesive [Tape] Itching, Swelling, Rash and Other (See Comments)    Also pain at location  . Codeine Nausea And Vomiting  . Bactrim [Sulfamethoxazole-Trimethoprim]     Muscle aches  . Cephalexin Hives  . Lisinopril Swelling  . Macrobid [Nitrofurantoin Monohyd Macro] Nausea And Vomiting  . Tamiflu [Oseltamivir Phosphate]    Social History   Socioeconomic History  . Marital status: Married    Spouse name: Not on file  . Number of children: Not on file  . Years of education: Not on file  . Highest education level: Not on file  Occupational History  . Not on file  Social Needs  . Financial resource strain: Not on file  . Food insecurity:    Worry: Not on file    Inability: Not on file  . Transportation needs:    Medical: Not on file    Non-medical: Not on file  Tobacco Use  . Smoking status: Never Smoker  . Smokeless tobacco: Never Used  Substance and Sexual Activity  . Alcohol use: No    Alcohol/week: 0.0 standard drinks  . Drug use: No  . Sexual  activity: Not Currently  Lifestyle  . Physical activity:    Days per week: Not on file    Minutes per session: Not on file  . Stress: Not on file  Relationships  . Social connections:    Talks on phone: Not on file    Gets together: Not on file    Attends religious service: Not  on file    Active member of club or organization: Not on file    Attends meetings of clubs or organizations: Not on file    Relationship status: Not on file  . Intimate partner violence:    Fear of current or ex partner: Not on file    Emotionally abused: Not on file    Physically abused: Not on file    Forced sexual activity: Not on file  Other Topics Concern  . Not on file  Social History Narrative   Married, 2 children. Retired from [employer] other systems reviewed and are negative.      Objective:   Physical Exam Vitals signs reviewed.  Cardiovascular:     Rate and Rhythm: Normal rate and regular rhythm.  Pulmonary:     Effort: Pulmonary effort is normal.     Breath sounds: Normal breath sounds.  Skin:    Findings: Erythema and rash present. Rash is macular and papular.       Neurological:     Mental Status: She is alert.           Assessment & Plan:  Urinary tract infection with hematuria, site unspecified - Plan: Urinalysis, Routine w reflex microscopic, Urine Culture  Venous stasis dermatitis of both lower extremities  I believe this is venous stasis dermatitis.  I recommended trying Elocon cream once daily to the affected area and then reassessing via telephone in 1 week.  I doubt Lyme disease given the fact the rash has been there previously in the past and is also involving both legs.  I also do not think it is cellulitis given the fact is on both legs and it does not hurt and it is not warm to the touch.  Urinalysis is almost completely back to normal I will send a urine culture

## 2019-03-23 ENCOUNTER — Telehealth: Payer: Self-pay | Admitting: Family Medicine

## 2019-03-23 NOTE — Telephone Encounter (Signed)
See note

## 2019-03-23 NOTE — Telephone Encounter (Signed)
Pt was given mometasone cream for the rash she had yesterday and states that she read ingredients on it and it has sulfa in it and she is HIGHLY allergic to sulfa. Wants to know if we can call in something different to [facility].

## 2019-03-23 NOTE — Telephone Encounter (Signed)
She should be fine to take that, it is not a sulfa antibiotic.

## 2019-03-24 LAB — URINE CULTURE
MICRO NUMBER:: 435355
SPECIMEN QUALITY:: ADEQUATE

## 2019-03-26 ENCOUNTER — Other Ambulatory Visit: Payer: Self-pay | Admitting: Family Medicine

## 2019-03-26 MED ORDER — AMOXICILLIN-POT CLAVULANATE 875-125 MG PO TABS: 1.0000 | ORAL_TABLET | Freq: Two times a day (BID) | ORAL | 0 refills | Status: AC

## 2019-03-26 NOTE — Telephone Encounter (Signed)
Spoke with patient and informed her that cream is ok to take.

## 2019-03-27 ENCOUNTER — Other Ambulatory Visit: Payer: Self-pay | Admitting: Family Medicine

## 2019-03-30 ENCOUNTER — Telehealth: Payer: Self-pay

## 2019-03-30 MED ORDER — FLUCONAZOLE 150 MG PO TABS: 150.0000 mg | ORAL_TABLET | Freq: Once | ORAL | 0 refills | Status: AC

## 2019-03-30 NOTE — Telephone Encounter (Signed)
I have no other options to which she is not allergic and that's not IV.  I would finish the augmentin.  It will not cause a uti but could cause a yeast infcetion.  Try diflucan 150 po x1 for yeast possible causing dysuria

## 2019-03-30 NOTE — Telephone Encounter (Signed)
Pt called to state that the Augmentin prescribed 05/04 is causing a new symptom of burning. Pt also states she is worried that she has another infection and wants to know if she should stop taking it. Please advise.

## 2019-03-30 NOTE — Telephone Encounter (Signed)
Spoke with patient and informed her of Dr.Pickards recommendations. Patient verbalized understanding. Diflucan sent to the pharmacy.

## 2019-04-02 ENCOUNTER — Other Ambulatory Visit: Payer: Self-pay | Admitting: Physician Assistant

## 2019-04-06 ENCOUNTER — Telehealth: Payer: Self-pay | Admitting: Family Medicine

## 2019-04-06 DIAGNOSIS — N390 Urinary tract infection, site not specified: Secondary | ICD-10-CM

## 2019-04-06 NOTE — Telephone Encounter (Signed)
Pt called and would like to know if we could refer her to a urologist for her frequent UTI's?

## 2019-04-09 NOTE — Telephone Encounter (Signed)
Pt states that all the providers here have recommended she go see a urologist for her UTI's.

## 2019-04-09 NOTE — Telephone Encounter (Signed)
Unless she is having symptoms, I do not feel this is medically necessary.  Is she having symptoms?

## 2019-04-10 NOTE — Telephone Encounter (Signed)
Pt is insistent and referral placed

## 2019-04-10 NOTE — Telephone Encounter (Signed)
I would still not recommend urology consult unless she is symptomatic.  However, if she insists I am ok with it.

## 2019-04-12 ENCOUNTER — Other Ambulatory Visit: Payer: Medicare Other

## 2019-04-17 ENCOUNTER — Encounter: Payer: Medicare Other | Admitting: Family Medicine

## 2019-04-20 ENCOUNTER — Other Ambulatory Visit: Payer: Self-pay

## 2019-04-20 MED ORDER — HYDROCHLOROTHIAZIDE 12.5 MG PO CAPS: 12.5000 mg | ORAL_CAPSULE | Freq: Every day | ORAL | 3 refills | Status: DC

## 2019-05-09 ENCOUNTER — Other Ambulatory Visit: Payer: Self-pay

## 2019-05-09 ENCOUNTER — Ambulatory Visit (INDEPENDENT_AMBULATORY_CARE_PROVIDER_SITE_OTHER): Payer: Medicare Other | Admitting: Allergy

## 2019-05-09 ENCOUNTER — Encounter: Payer: Self-pay | Admitting: Allergy

## 2019-05-09 DIAGNOSIS — T783XXD Angioneurotic edema, subsequent encounter: Secondary | ICD-10-CM

## 2019-05-09 MED ORDER — FAMOTIDINE 20 MG PO TABS: 20.0000 mg | ORAL_TABLET | Freq: Two times a day (BID) | ORAL | 1 refills | Status: DC

## 2019-05-09 MED ORDER — FEXOFENADINE HCL 180 MG PO TABS: 180.0000 mg | ORAL_TABLET | Freq: Two times a day (BID) | ORAL | 1 refills | Status: DC

## 2019-05-09 MED ORDER — PREDNISONE 20 MG PO TABS: 20.0000 mg | ORAL_TABLET | Freq: Every day | ORAL | 0 refills | Status: DC | PRN

## 2019-05-09 NOTE — Progress Notes (Signed)
RE: Lindsey Erickson MRN: [id number] DOB: Apr 21, 1932 Date of Telemedicine Visit: 05/09/2019  Referring provider: Susy Frizzle, MD Primary care provider: Susy Frizzle, MD  Chief Complaint: Angioedema (lower left lip swelling in the AM on February 09, 2019)   Telemedicine Follow Up Visit via Telephone: I connected with Lindsey Erickson for a follow up on 05/09/19 by telephone and verified that I am speaking with the correct person using two identifiers.   I discussed the limitations, risks, security and privacy concerns of performing an evaluation and management service by telephone and the availability of in person appointments. I also discussed with the patient that there may be a patient responsible charge related to this service. The patient expressed understanding and agreed to proceed.  Patient is at home.  Provider is at the office.  Visit start time: 9833 Visit end time: [place] consent/check in by: [place] consent and medical assistant/nurse: Sheliah Plane  History of Present Illness: She is a 85 y.o. female, who is being followed for angioedema. Her previous allergy office visit was on 01/05/19 with Dr. Nelva Bush.   She state she has a flare up with lower lip swelling in March and noticed it when she woke up and she took her allegra and pepcid and symptoms resolved.  She did not need to take prednisone for this episode.  Since she has not had any further episodes.  She states she started to wean and has gotten down to taking only Allegra twice a day.  She eliminated her last pepcid dose today.   She states she also stopped eating foods that contain pasta type sauce as she does think something in it like a herb was triggering the swelling.   Otherwise she states she is being treated with antibiotic for venous stasis dermatitis that "comes and goes".  She denies any major health changes, surgeries or hospitalizations.  Assessment and Plan: Lindsey Erickson is a 85 y.o. female  with:   Idiopathic Angioedema (lip swelling)     - episodic angioedema can be due to variety of different triggers.  ACE-I (lisinopril) is a known medication that can cause angioedema of the oral cavity.   The swelling can continue for months even despite stopping the medication.  This category of medications should not be taken again in the future.   Labs for swelling were largely unremarkable except for low level sensitivity to elm tree pollen.       - swelling episodes are becoming less severe and less frequent     - in process of weaning medications below as have been able to stop Pepcid completely.  Continue to wean per schedule below     - next wean will be evening dose of Allegra x 2 weeks and if no symptoms stop Allegra completely     - if you have a flare of lip swelling then take a dose of Allegra and Pepcid to help resolve swelling.  If needed may be take prednisone 20mg  to further resolve swelling.     Follow-up 6 months or sooner if needed     Diagnostics: None.  Medication List:  Current Outpatient Medications  Medication Sig Dispense Refill  . acetaminophen (TYLENOL) 500 MG tablet Take 500 mg by mouth every 6 (six) hours as needed for moderate pain.    Marland Kitchen aspirin 81 MG tablet Take 81 mg by mouth daily.      . carvedilol (COREG CR) 40 MG 24 hr capsule TAKE 1 CAPSULE  EVERY EVENING 90 capsule 4  . clindamycin (CLINDAGEL) 1 % gel Apply 1 application topically daily as needed (infection).     . clopidogrel (PLAVIX) 75 MG tablet TAKE 1 TABLET DAILY 90 tablet 3  . famotidine (PEPCID) 20 MG tablet Take 1 tablet (20 mg total) by mouth 2 (two) times daily. (Patient taking differently: Take 20 mg by mouth 2 (two) times daily as needed (during flare-ups). ) 180 tablet 2  . fexofenadine (ALLEGRA) 180 MG tablet Take 1 tablet (180 mg total) by mouth 2 (two) times daily. 180 tablet 2  . hydrochlorothiazide (MICROZIDE) 12.5 MG capsule Take 1 capsule (12.5 mg total) by mouth daily. 90 capsule 3   . isosorbide mononitrate (IMDUR) 60 MG 24 hr tablet Take 1 tablet (60 mg total) by mouth daily. 90 tablet 3  . mometasone (ELOCON) 0.1 % cream Apply 1 application topically daily. 45 g 0  . Multiple Vitamins-Minerals (CENTRUM SILVER ADULT 50+ PO) Take 1 tablet by mouth daily.    . nitroGLYCERIN (NITROSTAT) 0.4 MG SL tablet DISSOLVE 1 TABLET UNDER THE TONGUE EVERY 5 MINUTES AS NEEDED FOR CHEST PAIN 1 tablet 11  . phenazopyridine (PYRIDIUM) 100 MG tablet Take 1 tablet (100 mg total) by mouth 3 (three) times daily as needed for pain. 10 tablet 0  . predniSONE (DELTASONE) 20 MG tablet Take 20 mg by mouth daily as needed (Lip swelling).     . rosuvastatin (CRESTOR) 20 MG tablet Take 1 tablet (20 mg total) by mouth at bedtime. 90 tablet 3  . SYNTHROID 50 MCG tablet TAKE 1 TABLET ALTERNATING WITH ONE AND ONE-HALF TABLETS AS DIRECTED 108 tablet 4   No current facility-administered medications for this visit.    Allergies: Allergies  Allergen Reactions  . Ciprofloxacin Hives  . 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   ALL NARCOTICS- NAUSEA AND VOMITING   . Adhesive [Tape] Itching, Swelling, Rash and Other (See Comments)    Also pain at location  . Codeine Nausea And Vomiting  . Bactrim [Sulfamethoxazole-Trimethoprim]     Muscle aches  . Cephalexin Hives  . Lisinopril Swelling  . Macrobid [Nitrofurantoin Monohyd Macro] Nausea And Vomiting  . Tamiflu [Oseltamivir Phosphate]    I reviewed her past medical history, social history, family history, and environmental history and no significant changes have been reported from previous visit on 01/05/19.  In past 4 weeks: Review of Systems  Constitutional: Negative for chills and fever.  HENT: Negative for congestion, facial swelling, nosebleeds, postnasal drip, rhinorrhea, sneezing and sore throat.   Eyes: Negative for discharge, redness and itching.   Respiratory: Negative.   Cardiovascular: Negative.   Gastrointestinal: Negative.   Musculoskeletal: Negative.   Skin: Positive for rash (venous stasis).  Neurological: Negative for headaches.   Objective: Physical Exam Not obtained as encounter was done via telephone.   Previous notes and tests were reviewed.  I discussed the assessment and treatment plan with the patient. The patient was provided an opportunity to ask questions and all were answered. The patient agreed with the plan and demonstrated an understanding of the instructions.   The patient was advised to call back or seek an in-person evaluation if the symptoms worsen or if the condition fails to improve as anticipated.  I provided 18 minutes of non-face-to-face time during this encounter.  It was my pleasure to participate in Fargo Gales's care today. Please feel free  to contact me with any questions or concerns.   Sincerely,  Cianna Kasparian Charmian Muff, MD

## 2019-05-09 NOTE — Progress Notes (Signed)
Start 1035 Location home  End

## 2019-05-09 NOTE — Patient Instructions (Addendum)
Idiopathic Angioedema (lip swelling)     - episodic angioedema can be due to variety of different triggers.  ACE-I (lisinopril) is a known medication that can cause angioedema of the oral cavity.   The swelling can continue for months even despite stopping the medication.  This category of medications should not be taken again in the future.   Labs for swelling were largely unremarkable except for low level sensitivity to elm tree pollen.       - swelling episodes are becoming less severe and less frequent     - in process of weaning medications below as have been able to stop Pepcid completely.  Continue to wean per schedule below     - next wean will be evening dose of Allegra x 2 weeks and if no symptoms stop Allegra completely     - if you have a flare of lip swelling then take a dose of Allegra and Pepcid to help resolve swelling.  If needed may be take prednisone 20mg  to further resolve swelling.     Follow-up 6 months or sooner if needed

## 2019-05-17 ENCOUNTER — Other Ambulatory Visit: Payer: Medicare Other

## 2019-05-21 ENCOUNTER — Ambulatory Visit: Payer: Medicare Other | Admitting: Endocrinology

## 2019-06-04 ENCOUNTER — Other Ambulatory Visit: Payer: Self-pay | Admitting: Family Medicine

## 2019-06-14 ENCOUNTER — Other Ambulatory Visit: Payer: Self-pay | Admitting: Family Medicine

## 2019-06-14 ENCOUNTER — Encounter: Payer: Self-pay | Admitting: Family Medicine

## 2019-06-14 MED ORDER — ROSUVASTATIN CALCIUM 20 MG PO TABS: 20.0000 mg | ORAL_TABLET | Freq: Every day | ORAL | 0 refills | Status: DC

## 2019-06-25 ENCOUNTER — Other Ambulatory Visit: Payer: Self-pay | Admitting: Family Medicine

## 2019-06-25 ENCOUNTER — Other Ambulatory Visit: Payer: Self-pay

## 2019-06-25 ENCOUNTER — Other Ambulatory Visit: Payer: Medicare Other

## 2019-06-25 DIAGNOSIS — Z0000 Encounter for general adult medical examination without abnormal findings: Secondary | ICD-10-CM

## 2019-06-25 DIAGNOSIS — I959 Hypotension, unspecified: Secondary | ICD-10-CM

## 2019-06-25 DIAGNOSIS — Z79899 Other long term (current) drug therapy: Secondary | ICD-10-CM

## 2019-06-25 DIAGNOSIS — I10 Essential (primary) hypertension: Secondary | ICD-10-CM

## 2019-06-25 DIAGNOSIS — E7800 Pure hypercholesterolemia, unspecified: Secondary | ICD-10-CM

## 2019-06-25 DIAGNOSIS — E042 Nontoxic multinodular goiter: Secondary | ICD-10-CM

## 2019-06-25 DIAGNOSIS — I2510 Atherosclerotic heart disease of native coronary artery without angina pectoris: Secondary | ICD-10-CM

## 2019-06-25 DIAGNOSIS — E039 Hypothyroidism, unspecified: Secondary | ICD-10-CM

## 2019-06-26 ENCOUNTER — Telehealth: Payer: Self-pay

## 2019-06-26 LAB — COMPREHENSIVE METABOLIC PANEL
AG Ratio: 1.5 (calc) (ref 1.0–2.5)
ALT: 14 U/L (ref 6–29)
AST: 22 U/L (ref 10–35)
Albumin: 4 g/dL (ref 3.6–5.1)
Alkaline phosphatase (APISO): 54 U/L (ref 37–153)
BUN: 17 mg/dL (ref 7–25)
CO2: 29 mmol/L (ref 20–32)
Calcium: 9.8 mg/dL (ref 8.6–10.4)
Chloride: 102 mmol/L (ref 98–110)
Creat: 0.75 mg/dL (ref 0.60–0.88)
Globulin: 2.6 g/dL (calc) (ref 1.9–3.7)
Glucose, Bld: 113 mg/dL — ABNORMAL HIGH (ref 65–99)
Potassium: 4.4 mmol/L (ref 3.5–5.3)
Sodium: 139 mmol/L (ref 135–146)
Total Bilirubin: 0.7 mg/dL (ref 0.2–1.2)
Total Protein: 6.6 g/dL (ref 6.1–8.1)

## 2019-06-26 LAB — CBC WITH DIFFERENTIAL/PLATELET
Absolute Monocytes: 525 cells/uL (ref 200–950)
Basophils Absolute: 61 cells/uL (ref 0–200)
Basophils Relative: 1.2 %
Eosinophils Absolute: 342 cells/uL (ref 15–500)
Eosinophils Relative: 6.7 %
HCT: 39.1 % (ref 35.0–45.0)
Hemoglobin: 13.4 g/dL (ref 11.7–15.5)
Lymphs Abs: 1525 cells/uL (ref 850–3900)
MCH: 32.9 pg (ref 27.0–33.0)
MCHC: 34.3 g/dL (ref 32.0–36.0)
MCV: 96.1 fL (ref 80.0–100.0)
MPV: 10.2 fL (ref 7.5–12.5)
Monocytes Relative: 10.3 %
Neutro Abs: 2647 cells/uL (ref 1500–7800)
Neutrophils Relative %: 51.9 %
Platelets: 195 10*3/uL (ref 140–400)
RBC: 4.07 10*6/uL (ref 3.80–5.10)
RDW: 12.2 % (ref 11.0–15.0)
Total Lymphocyte: 29.9 %
WBC: 5.1 10*3/uL (ref 3.8–10.8)

## 2019-06-26 LAB — LIPID PANEL
Cholesterol: 148 mg/dL (ref <200)
HDL: 55 mg/dL (ref >=50)
LDL Cholesterol (Calc): 70 mg/dL (calc)
Non-HDL Cholesterol (Calc): 93 mg/dL (calc) (ref <130)
Total CHOL/HDL Ratio: 2.7 (calc) (ref <5.0)
Triglycerides: 145 mg/dL (ref <150)

## 2019-06-26 NOTE — Telephone Encounter (Signed)
Spoke to pt who states she would rather come in to the office for her appt with Dr. [name] on Friday because she has not seen him in a year. She stated she is not having any problems and thinks it will be better to be seen by Dr. [name] in person.   Explained to pt that the only thing he could not do for a telephone visit that he ciould do in the office is listren to her heart, and that if she is not having any problems, this should be ok. Pt adamantly stated she does not want to do a telephone visit. Told pt we will keep appt as is on Friday. Pt verbalized thanks.

## 2019-06-28 ENCOUNTER — Ambulatory Visit (INDEPENDENT_AMBULATORY_CARE_PROVIDER_SITE_OTHER): Payer: Medicare Other | Admitting: Family Medicine

## 2019-06-28 ENCOUNTER — Encounter: Payer: Self-pay | Admitting: Family Medicine

## 2019-06-28 VITALS — BP 120/70 | HR 70 | Temp 98.4°F | Resp 14 | Ht 67.0 in | Wt 167.0 lb

## 2019-06-28 DIAGNOSIS — I10 Essential (primary) hypertension: Secondary | ICD-10-CM

## 2019-06-28 DIAGNOSIS — Z0001 Encounter for general adult medical examination with abnormal findings: Secondary | ICD-10-CM | POA: Diagnosis not present

## 2019-06-28 DIAGNOSIS — E042 Nontoxic multinodular goiter: Secondary | ICD-10-CM | POA: Diagnosis not present

## 2019-06-28 DIAGNOSIS — I2510 Atherosclerotic heart disease of native coronary artery without angina pectoris: Secondary | ICD-10-CM | POA: Diagnosis not present

## 2019-06-28 DIAGNOSIS — Z0000 Encounter for general adult medical examination without abnormal findings: Secondary | ICD-10-CM

## 2019-06-28 NOTE — Progress Notes (Signed)
Subjective:    Patient ID: Lindsey Erickson, female    DOB: 03-22-1932, 85 y.o.   MRN: [id number]  HPI Patient is a very pleasant 85 year old white female who is here for CPE. She has a significant past medical history of coronary artery disease. According to her report she is suffered 2 separate myocardial infarctions. However on repeat catheterizations, the patient states that she has lesions which are not amenable to stenting. Therefore she is being managed medically. She is currently on a long-acting nitroglycerin, beta blocker, and ACE inhibitor, statin medication, and dual antiplatelet therapy. She follows up regularly with her cardiologist.  She is currently on Synthroid to help suppress the growth of her goiter.  She denies any pain or difficulty swallowing.  This was biopsied last year and the biopsy revealed no evidence of malignancy.  This is followed by Dr. Dwyane Dee.  Due to her age, it is not recommended that she have a colonoscopy or a half smear.  She is due for mammogram.  Her last mammogram was 3 years ago.  We had a long discussion then as we have had today.  The patient states that if she found a malignancy, she would not want any treatment.  Therefore since she would not want to treat the malignancy I have recommended against a screening test given that the screening test would not change our management strategy.  Patient is comfortable with this.  She is comfortable not looking.  Patient has not had a urinary tract infection since January.  Therefore she plans to cancel her appointment with urology.  Given the fact the patient has not had a urinary tract infection in 7 months I believe this is totally reasonable.  Her last bone density test showed osteopenia.  That was performed in 2018.  I would repeat that next year Immunization History  Administered Date(s) Administered  . Influenza Whole 07/23/2009  . Influenza,inj,Quad PF,6+ Mos 09/22/2015, 08/17/2016, 08/24/2017, 08/16/2018  .  Influenza-Unspecified 08/22/2013, 09/02/2014  . Pneumococcal Conjugate-13 01/14/2014  . Pneumococcal Polysaccharide-23 02/21/2012  . Zoster 09/23/2006    Past Medical History:  Diagnosis Date  . Anemia   . Angio-edema   . Arthritis   . Blood transfusion without reported diagnosis 1957  . CAD (coronary artery disease)    a. NSTEMI 2004 with thrombus in Cx and mod RCA. b. Nonischemic nuc 2008. c. mild NSTEMI 01/2013 (trop 0.42) -> cath with unchanged mod LAD & RCA disease, for continued medical therapy.  . Cancer ([place])   . Cataract   . H/O Hashimoto thyroiditis   . Hyperlipidemia   . HYPERTENSION    a. Also has h/o intermittentl low BP.  Marland Kitchen Hypothyroidism   . Kidney stones   . Myocardial infarction ([place])   . Osteoporosis    no fractures  . Prediabetes   . QT prolongation    a. Prior EKGs QTc 474, but on 4/1 was 524 for unclear reason.  . Recurrent urinary tract infection   . Squamous cell carcinoma of skin 04/17/2015  . Urticaria    Past Surgical History:  Procedure Laterality Date  . ADENOIDECTOMY    . APPENDECTOMY  1999  . Bilateral Arthroscopies of knees    . CARDIAC CATHETERIZATION     Moderate 3 vessel CAD by cath 05/2012  . CHOLECYSTECTOMY  02/2010  . JOINT REPLACEMENT  01/2010   L TKR - allusio  . LEFT HEART CATHETERIZATION WITH CORONARY ANGIOGRAM N/A 05/22/2012   Procedure: LEFT HEART CATHETERIZATION WITH  CORONARY ANGIOGRAM;  Surgeon: Sinclair Grooms, MD;  Location[facility] C[facility] LAB;  Service: Cardiovascular;  Laterality: N/A;  . LEFT HEART CATHETERIZATION WITH CORONARY ANGIOGRAM N/A 02/20/2014   Procedure: LEFT HEART CATHETERIZATION WITH CORONARY ANGIOGRAM;  Surgeon: Peter M Martinique, MD;  Location: [facility] CATH LAB;  Service: Cardiovascular;  Laterality: N/A;  . TONSILLECTOMY    . TUBAL LIGATION     Current Outpatient Medications on File Prior to Visit  Medication Sig Dispense Refill  . acetaminophen (TYLENOL) 500 MG tablet Take 500 mg by mouth every 6 (six) hours as needed for  moderate pain.    Marland Kitchen aspirin 81 MG tablet Take 81 mg by mouth daily.      . carvedilol (COREG CR) 40 MG 24 hr capsule TAKE 1 CAPSULE EVERY EVENING 90 capsule 4  . clindamycin (CLINDAGEL) 1 % gel Apply 1 application topically daily as needed (infection).     . clopidogrel (PLAVIX) 75 MG tablet TAKE 1 TABLET DAILY 90 tablet 3  . hydrochlorothiazide (MICROZIDE) 12.5 MG capsule Take 1 capsule (12.5 mg total) by mouth daily. 90 capsule 3  . isosorbide mononitrate (IMDUR) 60 MG 24 hr tablet Take 1 tablet (60 mg total) by mouth daily. 90 tablet 3  . mometasone (ELOCON) 0.1 % cream Apply 1 application topically daily. 45 g 0  . Multiple Vitamins-Minerals (CENTRUM SILVER ADULT 50+ PO) Take 1 tablet by mouth daily.    . nitroGLYCERIN (NITROSTAT) 0.4 MG SL tablet DISSOLVE 1 TABLET UNDER THE TONGUE EVERY 5 MINUTES AS NEEDED FOR CHEST PAIN 1 tablet 11  . predniSONE (DELTASONE) 20 MG tablet Take 1 tablet (20 mg total) by mouth daily as needed (Lip swelling). 10 tablet 0  . rosuvastatin (CRESTOR) 20 MG tablet Take 1 tablet (20 mg total) by mouth at bedtime. 90 tablet 0  . SYNTHROID 50 MCG tablet TAKE 1 TABLET ALTERNATING WITH ONE AND ONE-HALF TABLETS AS DIRECTED 108 tablet 4   No current facility-administered medications on file prior to visit.    Allergies  Allergen Reactions  . Ciprofloxacin Hives  . 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   ALL NARCOTICS- NAUSEA AND VOMITING   . Adhesive [Tape] Itching, Swelling, Rash and Other (See Comments)    Also pain at location  . Codeine Nausea And Vomiting  . Bactrim [Sulfamethoxazole-Trimethoprim]     Muscle aches  . Cephalexin Hives  . Lisinopril Swelling  . Macrobid [Nitrofurantoin Monohyd Macro] Nausea And Vomiting  . Tamiflu [Oseltamivir Phosphate]    Social History   Socioeconomic History  . Marital status: Married    Spouse name: Not on file  . Number of  children: Not on file  . Years of education: Not on file  . Highest education level: Not on file  Occupational History  . Not on file  Social Needs  . Financial resource strain: Not on file  . Food insecurity    Worry: Not on file    Inability: Not on file  . Transportation needs    Medical: Not on file    Non-medical: Not on file  Tobacco Use  . Smoking status: Never Smoker  . Smokeless tobacco: Never Used  Substance and Sexual Activity  . Alcohol use: No    Alcohol/week: 0.0 standard drinks  . Drug use: No  . Sexual activity: Not Currently  Lifestyle  . Physical activity    Days per  week: Not on file    Minutes per session: Not on file  . Stress: Not on file  Relationships  . Social Herbalist on phone: Not on file    Gets together: Not on file    Attends religious service: Not on file    Active member of club or organization: Not on file    Attends meetings of clubs or organizations: Not on file    Relationship status: Not on file  . Intimate partner violence    Fear of current or ex partner: Not on file    Emotionally abused: Not on file    Physically abused: Not on file    Forced sexual activity: Not on file  Other Topics Concern  . Not on file  Social History Narrative   Married, 2 children. Retired from [employer]   Family History  Problem Relation Age of Onset  . Allergies Father   . Cancer Father   . Coronary artery disease Mother 18  . Heart disease Mother   . CAD Sister 17  . Cancer Sister   . AAA (abdominal aortic aneurysm) Sister   . Hypertension Sister   . Hyperlipidemia Sister   . Diabetes Sister   . Heart disease Sister   . Diabetes Maternal Aunt   . Allergic rhinitis Neg Hx   . Asthma Neg Hx   . Eczema Neg Hx   . Urticaria Neg Hx       Review of Systems  All other systems reviewed and are negative.      Objective:   Physical Exam  Constitutional: She is oriented to person, place, and time. She appears  well-developed and well-nourished. No distress.  Neck: Neck supple. No JVD present. No tracheal deviation present. Thyromegaly present.  Cardiovascular: Normal rate, regular rhythm and normal heart sounds.  No murmur heard. Pulmonary/Chest: Effort normal and breath sounds normal. No respiratory distress. She has no wheezes. She has no rales.  Abdominal: Soft. Bowel sounds are normal. She exhibits no distension. There is no abdominal tenderness. There is no rebound and no guarding.  Musculoskeletal:        General: No edema.  Lymphadenopathy:    She has no cervical adenopathy.  Neurological: She is alert and oriented to person, place, and time. She has normal reflexes. No cranial nerve deficit. She exhibits normal muscle tone. Coordination normal.  Skin: She is not diaphoretic.  Vitals reviewed.    Lab on 06/25/2019  Component Date Value Ref Range Status  . WBC 06/25/2019 5.1  3.8 - 10.8 Thousand/uL Final  . RBC 06/25/2019 4.07  3.80 - 5.10 Million/uL Final  . Hemoglobin 06/25/2019 13.4  11.7 - 15.5 g/dL Final  . HCT 06/25/2019 39.1  35.0 - 45.0 % Final  . MCV 06/25/2019 96.1  80.0 - 100.0 fL Final  . MCH 06/25/2019 32.9  27.0 - 33.0 pg Final  . MCHC 06/25/2019 34.3  32.0 - 36.0 g/dL Final  . RDW 06/25/2019 12.2  11.0 - 15.0 % Final  . Platelets 06/25/2019 195  140 - 400 Thousand/uL Final  . MPV 06/25/2019 10.2  7.5 - 12.5 fL Final  . Neutro Abs 06/25/2019 2,647  1,500 - 7,800 cells/uL Final  . Lymphs Abs 06/25/2019 1,525  850 - 3,900 cells/uL Final  . Absolute Monocytes 06/25/2019 525  200 - 950 cells/uL Final  . Eosinophils Absolute 06/25/2019 342  15 - 500 cells/uL Final  . Basophils Absolute 06/25/2019 61  0 -  200 cells/uL Final  . Neutrophils Relative % 06/25/2019 51.9  % Final  . Total Lymphocyte 06/25/2019 29.9  % Final  . Monocytes Relative 06/25/2019 10.3  % Final  . Eosinophils Relative 06/25/2019 6.7  % Final  . Basophils Relative 06/25/2019 1.2  % Final  . Glucose,  Bld 06/25/2019 113* 65 - 99 mg/dL Final   Comment: .            Fasting reference interval . For someone without known diabetes, a glucose value between 100 and 125 mg/dL is consistent with prediabetes and should be confirmed with a follow-up test. .   . BUN 06/25/2019 17  7 - 25 mg/dL Final  . Creat 06/25/2019 0.75  0.60 - 0.88 mg/dL Final   Comment: For patients >42 years of age, the reference limit for Creatinine is approximately 13% higher for people identified as African-American. .   Havery Moros Ratio 18/84/1660 NOT APPLICABLE  6 - 22 (calc) Final  . Sodium 06/25/2019 139  135 - 146 mmol/L Final  . Potassium 06/25/2019 4.4  3.5 - 5.3 mmol/L Final  . Chloride 06/25/2019 102  98 - 110 mmol/L Final  . CO2 06/25/2019 29  20 - 32 mmol/L Final  . Calcium 06/25/2019 9.8  8.6 - 10.4 mg/dL Final  . Total Protein 06/25/2019 6.6  6.1 - 8.1 g/dL Final  . Albumin 06/25/2019 4.0  3.6 - 5.1 g/dL Final  . Globulin 06/25/2019 2.6  1.9 - 3.7 g/dL (calc) Final  . AG Ratio 06/25/2019 1.5  1.0 - 2.5 (calc) Final  . Total Bilirubin 06/25/2019 0.7  0.2 - 1.2 mg/dL Final  . Alkaline phosphatase (APISO) 06/25/2019 54  37 - 153 U/L Final  . AST 06/25/2019 22  10 - 35 U/L Final  . ALT 06/25/2019 14  6 - 29 U/L Final  . Cholesterol 06/25/2019 148  <200 mg/dL Final  . HDL 06/25/2019 55  > OR = 50 mg/dL Final  . Triglycerides 06/25/2019 145  <150 mg/dL Final  . LDL Cholesterol (Calc) 06/25/2019 70  mg/dL (calc) Final   Comment: Reference range: <100 . Desirable range <100 mg/dL for primary prevention;   <70 mg/dL for patients with CHD or diabetic patients  with > or = 2 CHD risk factors. Marland Kitchen LDL-C is now calculated using the Martin-Hopkins  calculation, which is a validated novel method providing  better accuracy than the Friedewald equation in the  estimation of LDL-C.  Cresenciano Genre et al. Annamaria Helling. 6301;601(09): 2061-2068  (http://education.[facility].com/faq/FAQ164)   . Total CHOL/HDL  Ratio 06/25/2019 2.7  <5.0 (calc) Final  . Non-HDL Cholesterol (Calc) 06/25/2019 93  <130 mg/dL (calc) Final   Comment: For patients with diabetes plus 1 major ASCVD risk  factor, treating to a non-HDL-C goal of <100 mg/dL  (LDL-C of <70 mg/dL) is considered a therapeutic  option.         Assessment & Plan:  The primary encounter diagnosis was General medical exam. Diagnoses of Multinodular goiter, Essential hypertension, and Coronary artery disease involving native coronary artery of native heart without angina pectoris were also pertinent to this visit. Patient's physical exam is significant for a large goiter.  Otherwise she is recently had a squamous cell carcinoma removed from her right upper lateral shin.  This is being healed through secondary intention.  It is roughly the diameter of a nickel.  There is no evidence of secondary cellulitis.  She has decided against a mammogram.  Due to age I would not  recommend a colonoscopy or a Pap smear.  I would repeat her bone density next year.  Her lab work is outstanding except for prediabetes with a fasting blood sugar of 113.  I have recommended against a high carbohydrate diet and have recommended more fresh vegetables in her diet.  I have also recommended exercise as tolerated given her age.  Musicians are up-to-date.  Patient denies any issues with falls, depression, or memory loss.

## 2019-06-29 ENCOUNTER — Ambulatory Visit (INDEPENDENT_AMBULATORY_CARE_PROVIDER_SITE_OTHER): Payer: Medicare Other | Admitting: Cardiovascular Disease

## 2019-06-29 ENCOUNTER — Other Ambulatory Visit: Payer: Self-pay

## 2019-06-29 ENCOUNTER — Encounter: Payer: Self-pay | Admitting: Cardiovascular Disease

## 2019-06-29 DIAGNOSIS — I10 Essential (primary) hypertension: Secondary | ICD-10-CM | POA: Diagnosis not present

## 2019-06-29 DIAGNOSIS — I2510 Atherosclerotic heart disease of native coronary artery without angina pectoris: Secondary | ICD-10-CM | POA: Diagnosis not present

## 2019-06-29 DIAGNOSIS — E782 Mixed hyperlipidemia: Secondary | ICD-10-CM | POA: Diagnosis not present

## 2019-06-29 DIAGNOSIS — I214 Non-ST elevation (NSTEMI) myocardial infarction: Secondary | ICD-10-CM | POA: Diagnosis not present

## 2019-06-29 NOTE — Patient Instructions (Signed)
Medication Instructions:  Your physician recommends that you continue on your current medications as directed. Please refer to the Current Medication list given to you today.  If you need a refill on your cardiac medications before your next appointment, please call your pharmacy.   Lab work: NONE If you have labs (blood work) drawn today and your tests are completely normal, you will receive your results only by: Marland Kitchen MyChart Message (if you have MyChart) OR . A paper copy in the mail If you have any lab test that is abnormal or we need to change your treatment, we will call you to review the results.  Testing/Procedures: NONE  Follow-Up: At [facility], you and your health needs are our priority.  As part of our continuing mission to provide you with exceptional heart care, we have created designated Provider Care Teams.  These Care Teams include your primary Cardiologist (physician) and Advanced Practice Providers (APPs -  Physician Assistants and Nurse Practitioners) who all work together to provide you with the care you need, when you need it. . You will need a follow up appointment in 3 months WITH RHONDA BARRETT, PA-C AND IN 12 MONTHS WITH DR. [name].  Please call our office 2 months in advance to schedule this appointment.

## 2019-06-29 NOTE — Assessment & Plan Note (Signed)
History of essential hypertension with blood pressure measured today 132/66.  She is on carvedilol and hydrochlorothiazide.

## 2019-06-29 NOTE — Assessment & Plan Note (Signed)
History of hyperlipidemia on statin therapy with recent lipid profile performed by her PCP revealing total cholesterol 148, LDL 70 and HDL 55.

## 2019-06-29 NOTE — Assessment & Plan Note (Signed)
History of non-STEMI dating back to 2004 when she had thrombus in her circumflex and moderate RCA disease.  She had a heart cath 02/20/2014 by Dr. Irish Lack after being made with chest pain with evolving enzymes.  She has 70% mid RCA and 5060% mid circumflex with negative FFR.  Medical therapy was recommended.  She has recently noticed atypical chest pain occurring every couple months lasting for seconds at a time.

## 2019-06-29 NOTE — Progress Notes (Signed)
06/29/2019 Lindsey Erickson   [birth year]  [id number]  Primary Physician Erickson, Lindsey Mcgee, MD Primary Cardiologist: Lindsey Harp MD Lindsey Erickson, [place]  HPI:  Lindsey Erickson is a 85 y.o.  mildly overweight married Caucasian female mother of 2 children , grandmother of 4 grandchildren who was self-referred to be established in my practice. She was previously a cardiology patient of Dr. Pernell Erickson. Her new primary care physician is Dr. Margaretmary Erickson at [facility] family practice. I last saw her in the office 03/01/2018.. Cardiovascular risk factor profile is notable for treated hypertension and hyperlipidemia. She does have a family history his sister who's had stents. Her cardiac history dates back to 2004 when she had a non-STEMI related to thrombus in the circumflex with moderate RCA disease. She had a negative nuclear stress test in 2008 and underwent cardiac catheterization one year ago on 02/20/14 by Dr. Irish Erickson after being admitted with chest pain and evolving positive enzymes. She gets infrequent nitroglycerin responsive chest pain. Her anatomy at the time of her last cath was notable for a 70% mid RCA lesion, 50-60% mid-circumflex lesion. The RCA underwent FFR interrogation which measured 0.9 suggesting that it was not physiologically significant. Since I saw her a year ago she's remained clinically stable without chest pain or shortness of breath. She was seen in the emergency room angioedema related to lisinopril which was discontinued. Blood pressure at home have remained under good control.  Since I saw her in the office April 2019 she has done well.  She has developed some atypical chest pain over the last several months occurring every couple months lasting seconds at a time which is somewhat atypical.  She also had a skin cancer removed from her right leg recently.    Current Meds  Medication Sig  . acetaminophen (TYLENOL) 500 MG tablet Take 500 mg by mouth every 6 (six)  hours as needed for moderate pain.  Marland Kitchen aspirin 81 MG tablet Take 81 mg by mouth daily.    . carvedilol (COREG CR) 40 MG 24 hr capsule TAKE 1 CAPSULE EVERY EVENING  . clindamycin (CLINDAGEL) 1 % gel Apply 1 application topically daily as needed (infection).   . clopidogrel (PLAVIX) 75 MG tablet TAKE 1 TABLET DAILY  . famotidine (PEPCID) 20 MG tablet Take 20 mg by mouth 2 (two) times daily.  . fexofenadine (ALLEGRA) 180 MG tablet Take 180 mg by mouth daily.  . hydrochlorothiazide (MICROZIDE) 12.5 MG capsule Take 1 capsule (12.5 mg total) by mouth daily.  . isosorbide mononitrate (IMDUR) 60 MG 24 hr tablet Take 1 tablet (60 mg total) by mouth daily.  . mometasone (ELOCON) 0.1 % cream Apply 1 application topically daily.  . Multiple Vitamins-Minerals (CENTRUM SILVER ADULT 50+ PO) Take 1 tablet by mouth daily.  . nitroGLYCERIN (NITROSTAT) 0.4 MG SL tablet DISSOLVE 1 TABLET UNDER THE TONGUE EVERY 5 MINUTES AS NEEDED FOR CHEST PAIN  . predniSONE (DELTASONE) 20 MG tablet Take 1 tablet (20 mg total) by mouth daily as needed (Lip swelling).  . rosuvastatin (CRESTOR) 20 MG tablet Take 1 tablet (20 mg total) by mouth at bedtime.  Marland Kitchen SYNTHROID 50 MCG tablet TAKE 1 TABLET ALTERNATING WITH ONE AND ONE-HALF TABLETS AS DIRECTED     Allergies  Allergen Reactions  . Ciprofloxacin Hives  . 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   ALL NARCOTICS-  NAUSEA AND VOMITING   . Adhesive [Tape] Itching, Swelling, Rash and Other (See Comments)    Also pain at location  . Codeine Nausea And Vomiting  . Bactrim [Sulfamethoxazole-Trimethoprim]     Muscle aches  . Cephalexin Hives  . Lisinopril Swelling  . Macrobid [Nitrofurantoin Monohyd Macro] Nausea And Vomiting  . Tamiflu [Oseltamivir Phosphate]     Social History   Socioeconomic History  . Marital status: Married    Spouse name: Not on file  . Number of children: Not on  file  . Years of education: Not on file  . Highest education level: Not on file  Occupational History  . Not on file  Social Needs  . Financial resource strain: Not on file  . Food insecurity    Worry: Not on file    Inability: Not on file  . Transportation needs    Medical: Not on file    Non-medical: Not on file  Tobacco Use  . Smoking status: Never Smoker  . Smokeless tobacco: Never Used  Substance and Sexual Activity  . Alcohol use: No    Alcohol/week: 0.0 standard drinks  . Drug use: No  . Sexual activity: Not Currently  Lifestyle  . Physical activity    Days per week: Not on file    Minutes per session: Not on file  . Stress: Not on file  Relationships  . Social Herbalist on phone: Not on file    Gets together: Not on file    Attends religious service: Not on file    Active member of club or organization: Not on file    Attends meetings of clubs or organizations: Not on file    Relationship status: Not on file  . Intimate partner violence    Fear of current or ex partner: Not on file    Emotionally abused: Not on file    Physically abused: Not on file    Forced sexual activity: Not on file  Other Topics Concern  . Not on file  Social History Narrative   Married, 2 children. Retired from [employer]: General: negative for chills, fever, night sweats or weight changes.  Cardiovascular: negative for chest pain, dyspnea on exertion, edema, orthopnea, palpitations, paroxysmal nocturnal dyspnea or shortness of breath Dermatological: negative for rash Respiratory: negative for cough or wheezing Urologic: negative for hematuria Abdominal: negative for nausea, vomiting, diarrhea, bright red blood per rectum, melena, or hematemesis Neurologic: negative for visual changes, syncope, or dizziness All other systems reviewed and are otherwise negative except as noted above.    Blood pressure 132/66, pulse 70, temperature 97.7 F  (36.5 C), height 5\' 7"  (1.702 m), weight 166 lb (75.3 kg).  General appearance: alert and no distress Neck: no adenopathy, no carotid bruit, no JVD, supple, symmetrical, trachea midline and thyroid not enlarged, symmetric, no tenderness/mass/nodules Lungs: clear to auscultation bilaterally Heart: regular rate and rhythm, S1, S2 normal, no murmur, click, rub or gallop Extremities: extremities normal, atraumatic, no cyanosis or edema Pulses: 2+ and symmetric Skin: Skin color, texture, turgor normal. No rashes or lesions Neurologic: Alert and oriented X 3, normal strength and tone. Normal symmetric reflexes. Normal coordination and gait  EKG not performed today  ASSESSMENT AND PLAN:   Essential hypertension History of essential hypertension with blood pressure measured today 132/66.  She is on carvedilol and hydrochlorothiazide.  NSTEMI (non-ST elevated myocardial infarction) History of non-STEMI dating back to 2004  when she had thrombus in her circumflex and moderate RCA disease.  She had a heart cath 02/20/2014 by Dr. Irish Erickson after being made with chest pain with evolving enzymes.  She has 70% mid RCA and 5060% mid circumflex with negative FFR.  Medical therapy was recommended.  She has recently noticed atypical chest pain occurring every couple months lasting for seconds at a time.  Hyperlipidemia History of hyperlipidemia on statin therapy with recent lipid profile performed by her PCP revealing total cholesterol 148, LDL 70 and HDL 55.      Lindsey Harp MD [facility], [facility] 06/29/2019 11:26 AM

## 2019-07-31 ENCOUNTER — Other Ambulatory Visit: Payer: Self-pay

## 2019-07-31 ENCOUNTER — Other Ambulatory Visit (INDEPENDENT_AMBULATORY_CARE_PROVIDER_SITE_OTHER): Payer: Medicare Other

## 2019-07-31 DIAGNOSIS — R7303 Prediabetes: Secondary | ICD-10-CM

## 2019-07-31 DIAGNOSIS — I10 Essential (primary) hypertension: Secondary | ICD-10-CM

## 2019-07-31 DIAGNOSIS — E042 Nontoxic multinodular goiter: Secondary | ICD-10-CM | POA: Diagnosis not present

## 2019-07-31 LAB — TSH: TSH: 2.26 u[IU]/mL (ref 0.35–4.50)

## 2019-07-31 LAB — T4, FREE: Free T4: 0.91 ng/dL (ref 0.60–1.60)

## 2019-07-31 LAB — HEMOGLOBIN A1C: Hgb A1c MFr Bld: 5.9 % (ref 4.6–6.5)

## 2019-08-02 ENCOUNTER — Encounter: Payer: Self-pay | Admitting: Endocrinology

## 2019-08-02 ENCOUNTER — Ambulatory Visit (INDEPENDENT_AMBULATORY_CARE_PROVIDER_SITE_OTHER): Payer: Medicare Other | Admitting: Endocrinology

## 2019-08-02 ENCOUNTER — Other Ambulatory Visit: Payer: Self-pay

## 2019-08-02 VITALS — BP 130/74 | HR 71 | Ht 67.0 in | Wt 166.8 lb

## 2019-08-02 DIAGNOSIS — R7303 Prediabetes: Secondary | ICD-10-CM | POA: Diagnosis not present

## 2019-08-02 DIAGNOSIS — I2510 Atherosclerotic heart disease of native coronary artery without angina pectoris: Secondary | ICD-10-CM

## 2019-08-02 DIAGNOSIS — E042 Nontoxic multinodular goiter: Secondary | ICD-10-CM | POA: Diagnosis not present

## 2019-08-02 DIAGNOSIS — Z23 Encounter for immunization: Secondary | ICD-10-CM | POA: Diagnosis not present

## 2019-08-02 NOTE — Progress Notes (Signed)
Patient ID: Lindsey DUBOISE, female   DOB: 1932-10-27, 85 y.o.   MRN: [id number]   Reason for Appointment:  Endocrinology followup     History of Present Illness:   She initially had a multinodular goiter in 1982 She has had 3 biopsies of her thyroid nodules subsequently with the last one in 2003 which were benign Her biopsies had shown a few follicular cells and lymphocytes, not clear which nodule was biopsied  ? Hypothyroidism:  Most likely she has been on thyroid suppression for the goiter rather than true hypothyroidism although previous records do indicate Hashimoto thyroiditis  The treatments that the patient  had taken include Synthroid 88 mcg for several years  This was reduced to 50 g in 11/16 because of low normal TSH of 0.36.           Because of her symptoms of feeling a little tired and cold even with normal TSH in the past her dose was increased from the 50 mcg dose She has been taking an additional half tablet 3 times a week of her 50 mcg levothyroxine  Subsequent TSH levels have been normal consistently  She feels fairly good, not complaining of fatigue or cold intolerance No significant weight change  She is very regular with taking her levothyroxine before breakfast as prescribed .   Lab Results  Component Value Date   TSH 2.26 07/31/2019   TSH 2.77 11/20/2018   TSH 2.893 05/03/2018   FREET4 0.91 07/31/2019   FREET4 1.04 11/20/2018   FREET4 1.01 12/22/2017    GOITER: She has had a long-standing enlargement, mostly in the midline This has been stable Recently no symptoms of local pressure, difficulty swallowing or discomfort  Her thyroid enlargement has been unchanged in the last few years  She had a biopsy done elsewhere in 9/19 ordered by her PCP and this was again benign, diagnosis was benign hyperplastic thyroid nodule category 2   PREDIABETES:   Her last fasting glucose previously has been as high as 129 in 2017 December At that  time glucose tolerance test did not show her glucose to be abnormal at 2 hours although it was 204 at 1 hour   A1c has been consistently in the upper normal range, has been as low as 5.4 and again 5.9 Fasting glucose 113  Occasionally has had prednisone for her angioedema but usually takes 1 tablet with relief  She has been usually watching her diet except occasional sweets for celebration  Weight is stable  Wt Readings from Last 3 Encounters:  08/02/19 166 lb 12.8 oz (75.7 kg)  06/29/19 166 lb (75.3 kg)  06/28/19 167 lb (75.8 kg)    Lab Results  Component Value Date   HGBA1C 5.9 07/31/2019   HGBA1C 5.9 11/20/2018   HGBA1C 5.8 12/22/2017   Lab Results  Component Value Date   LDLCALC 70 06/25/2019   CREATININE 0.75 06/25/2019     Allergies as of 08/02/2019      Reactions   Ciprofloxacin Hives   Other Other (See Comments)   Frozen Blood Plasma-caused patient to crash / cardiac arrest / CAN NEVER TAKE THIS AGAIN-PER MD'S.  ALL NARCOTICS- NAUSEA AND VOMITING    Adhesive [tape] Itching, Swelling, Rash, Other (See Comments)   Also pain at location   Codeine Nausea And Vomiting   Bactrim [sulfamethoxazole-trimethoprim]    Muscle aches   Cephalexin Hives   Lisinopril Swelling   Macrobid [nitrofurantoin Monohyd Macro] Nausea And Vomiting  Tamiflu [oseltamivir Phosphate]       Medication List       Accurate as of August 02, 2019  9:30 AM. If you have any questions, ask your nurse or doctor.        acetaminophen 500 MG tablet Commonly known as: TYLENOL Take 500 mg by mouth every 6 (six) hours as needed for moderate pain.   aspirin 81 MG tablet Take 81 mg by mouth daily.   carvedilol 40 MG 24 hr capsule Commonly known as: COREG CR TAKE 1 CAPSULE EVERY EVENING   CENTRUM SILVER ADULT 50+ PO Take 1 tablet by mouth daily.   clindamycin 1 % gel Commonly known as: CLINDAGEL Apply 1 application topically daily as needed (infection).   clopidogrel 75 MG tablet  Commonly known as: PLAVIX TAKE 1 TABLET DAILY   famotidine 20 MG tablet Commonly known as: PEPCID Take 20 mg by mouth 2 (two) times daily.   fexofenadine 180 MG tablet Commonly known as: ALLEGRA Take 180 mg by mouth daily.   hydrochlorothiazide 12.5 MG capsule Commonly known as: MICROZIDE Take 1 capsule (12.5 mg total) by mouth daily.   isosorbide mononitrate 60 MG 24 hr tablet Commonly known as: IMDUR Take 1 tablet (60 mg total) by mouth daily.   mometasone 0.1 % cream Commonly known as: Elocon Apply 1 application topically daily.   nitroGLYCERIN 0.4 MG SL tablet Commonly known as: NITROSTAT DISSOLVE 1 TABLET UNDER THE TONGUE EVERY 5 MINUTES AS NEEDED FOR CHEST PAIN   predniSONE 20 MG tablet Commonly known as: DELTASONE Take 1 tablet (20 mg total) by mouth daily as needed (Lip swelling).   rosuvastatin 20 MG tablet Commonly known as: CRESTOR Take 1 tablet (20 mg total) by mouth at bedtime.   Synthroid 50 MCG tablet Generic drug: levothyroxine TAKE 1 TABLET ALTERNATING WITH ONE AND ONE-HALF TABLETS AS DIRECTED What changed: See the new instructions.       Allergies:  Allergies  Allergen Reactions  . Ciprofloxacin Hives  . 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   ALL NARCOTICS- NAUSEA AND VOMITING   . Adhesive [Tape] Itching, Swelling, Rash and Other (See Comments)    Also pain at location  . Codeine Nausea And Vomiting  . Bactrim [Sulfamethoxazole-Trimethoprim]     Muscle aches  . Cephalexin Hives  . Lisinopril Swelling  . Macrobid [Nitrofurantoin Monohyd Macro] Nausea And Vomiting  . Tamiflu [Oseltamivir Phosphate]     Past Medical History:  Diagnosis Date  . Anemia   . Angio-edema   . Arthritis   . Blood transfusion without reported diagnosis 1957  . CAD (coronary artery disease)    a. NSTEMI 2004 with thrombus in Cx and mod RCA. b. Nonischemic nuc 2008. c.  mild NSTEMI 01/2013 (trop 0.42) -> cath with unchanged mod LAD & RCA disease, for continued medical therapy.  . Cancer ([place])   . Cataract   . H/O Hashimoto thyroiditis   . Hyperlipidemia   . HYPERTENSION    a. Also has h/o intermittentl low BP.  Marland Kitchen Hypothyroidism   . Kidney stones   . Myocardial infarction ([place])   . Osteoporosis    no fractures  . Prediabetes   . QT prolongation    a. Prior EKGs QTc 474, but on 4/1 was 524 for unclear reason.  . Recurrent urinary tract infection   . Squamous cell carcinoma of skin 04/17/2015  .  Urticaria     Past Surgical History:  Procedure Laterality Date  . ADENOIDECTOMY    . APPENDECTOMY  1999  . Bilateral Arthroscopies of knees    . CARDIAC CATHETERIZATION     Moderate 3 vessel CAD by cath 05/2012  . CHOLECYSTECTOMY  02/2010  . JOINT REPLACEMENT  01/2010   L TKR - allusio  . LEFT HEART CATHETERIZATION WITH CORONARY ANGIOGRAM N/A 05/22/2012   Procedure: LEFT HEART CATHETERIZATION WITH CORONARY ANGIOGRAM;  Surgeon: Sinclair Grooms, MD;  Location: [facility] CATH LAB;  Service: Cardiovascular;  Laterality: N/A;  . LEFT HEART CATHETERIZATION WITH CORONARY ANGIOGRAM N/A 02/20/2014   Procedure: LEFT HEART CATHETERIZATION WITH CORONARY ANGIOGRAM;  Surgeon: Peter M Martinique, MD;  Location: [facility] CATH LAB;  Service: Cardiovascular;  Laterality: N/A;  . TONSILLECTOMY    . TUBAL LIGATION      Family History  Problem Relation Age of Onset  . Allergies Father   . Cancer Father   . Coronary artery disease Mother 49  . Heart disease Mother   . CAD Sister 37  . Cancer Sister   . AAA (abdominal aortic aneurysm) Sister   . Hypertension Sister   . Hyperlipidemia Sister   . Diabetes Sister   . Heart disease Sister   . Diabetes Maternal Aunt   . Allergic rhinitis Neg Hx   . Asthma Neg Hx   . Eczema Neg Hx   . Urticaria Neg Hx     Social History:  reports that she has never smoked. She has never used smokeless tobacco. She reports that she does not drink alcohol  or use drugs.  REVIEW Of SYSTEMS:  History of CAD, followed regularly by cardiologist  Hypertension: Is on a 2 drug regimen, Followed by PCP and cardiologist   BP Readings from Last 3 Encounters:  08/02/19 130/74  06/29/19 132/66  06/28/19 120/70      Examination:   BP 130/74 (BP Location: Left Arm, Patient Position: Sitting, Cuff Size: Normal)   Pulse 71   Ht 5\' 7"  (1.702 m)   Wt 166 lb 12.8 oz (75.7 kg)   SpO2 98%   BMI 26.12 kg/m    Thyroid is enlarged and prominent in the midline Thyroid is relatively smooth and firm and most of the enlargement is in the isthmus area at least 3-3-1/2 times normal   Lower neck circumference is 41.5, previously 41  No lymphadenopathy in the neck    Assessments/Plan   Impaired fasting glucose:   Her blood sugars are stable with A1c 5.9 Fasting glucose 113 although she has been irregular with her diet recently No progression to diabetes Discussed trying to be as consistent as possible with her diet and will need to observe her without any treatment at this point since she has no progression  Recheck in 6 months  Multinodular goiter, long-standing, clinically stable with no change in local pressure symptoms Has previous history of 3 biopsies which have been benign  Examination shows a goiter to be about the same in size as measured by neck circumference and size of her wrist most enlargement  LIPIDS: Well controlled with Crestor, followed by PCP  Mild hypothyroidism: She feels fairly good with her energy level TSH is consistently normal  She is taking brand name Synthroid 50 g, 8-1/2 tablets a week  She will continue the same dosage and follow-up in 6 months again  Influenza vaccine given   Elayne Snare 08/02/2019, 9:30 AM

## 2019-09-13 ENCOUNTER — Ambulatory Visit (INDEPENDENT_AMBULATORY_CARE_PROVIDER_SITE_OTHER): Payer: Medicare Other | Admitting: Family Medicine

## 2019-09-13 ENCOUNTER — Encounter: Payer: Self-pay | Admitting: Family Medicine

## 2019-09-13 ENCOUNTER — Other Ambulatory Visit: Payer: Self-pay

## 2019-09-13 VITALS — BP 110/76 | HR 84 | Temp 97.6°F | Resp 16 | Ht 67.0 in | Wt 166.0 lb

## 2019-09-13 DIAGNOSIS — L2089 Other atopic dermatitis: Secondary | ICD-10-CM

## 2019-09-13 DIAGNOSIS — I2510 Atherosclerotic heart disease of native coronary artery without angina pectoris: Secondary | ICD-10-CM | POA: Diagnosis not present

## 2019-09-13 MED ORDER — NITROGLYCERIN 0.4 MG SL SUBL: 0.4000 mg | SUBLINGUAL_TABLET | SUBLINGUAL | 3 refills | Status: DC | PRN

## 2019-09-13 NOTE — Progress Notes (Signed)
Subjective:    Patient ID: Lindsey Erickson, female    DOB: May 08, 1932, 85 y.o.   MRN: [id number]  HPI  This is a difficult exam today.  The patient has had a rash 3 times over the last 4 or so weeks.  It will occur suddenly without seeming provocation.  It itches.  Then the rash will improve after just a few days.  Initially she had it on the left antecubital fossa.  She describes it as an erythematous patch of skin with small bumps.  It went away after a few days but it itched terribly.  She thought it could have been a bug bite.  The next case was an erythematous patch of skin over her superior medial right thigh.  It had a similar appearance to the rash on her left antecubital fossa.  This also resolved after a few days.  Most recently she had an extremely pruritic rash on the posterior left shoulder.  She describes it is erythematous skin that itch terribly.  However she saw no visible rash.  This also subsided after 3 to 4 days.  There is no visible rash today.  She believes she is having allergic reaction to creams that the dermatologist gave her that she was applying only to her leg which include clobetasol or mometasone. Past Medical History:  Diagnosis Date  . Anemia   . Angio-edema   . Arthritis   . Blood transfusion without reported diagnosis 1957  . CAD (coronary artery disease)    a. NSTEMI 2004 with thrombus in Cx and mod RCA. b. Nonischemic nuc 2008. c. mild NSTEMI 01/2013 (trop 0.42) -> cath with unchanged mod LAD & RCA disease, for continued medical therapy.  . Cancer ([place])   . Cataract   . H/O Hashimoto thyroiditis   . Hyperlipidemia   . HYPERTENSION    a. Also has h/o intermittentl low BP.  Marland Kitchen Hypothyroidism   . Kidney stones   . Myocardial infarction ([place])   . Osteoporosis    no fractures  . Prediabetes   . QT prolongation    a. Prior EKGs QTc 474, but on 4/1 was 524 for unclear reason.  . Recurrent urinary tract infection   . Squamous cell carcinoma of skin  04/17/2015  . Urticaria    Past Surgical History:  Procedure Laterality Date  . ADENOIDECTOMY    . APPENDECTOMY  1999  . Bilateral Arthroscopies of knees    . CARDIAC CATHETERIZATION     Moderate 3 vessel CAD by cath 05/2012  . CHOLECYSTECTOMY  02/2010  . JOINT REPLACEMENT  01/2010   L TKR - allusio  . LEFT HEART CATHETERIZATION WITH CORONARY ANGIOGRAM N/A 05/22/2012   Procedure: LEFT HEART CATHETERIZATION WITH CORONARY ANGIOGRAM;  Surgeon: Sinclair Grooms, MD;  Location[facility] C[facility] LAB;  Service: Cardiovascular;  Laterality: N/A;  . LEFT HEART CATHETERIZATION WITH CORONARY ANGIOGRAM N/A 02/20/2014   Procedure: LEFT HEART CATHETERIZATION WITH CORONARY ANGIOGRAM;  Surgeon: Peter M Martinique, MD;  Location: [facility] CATH LAB;  Service: Cardiovascular;  Laterality: N/A;  . TONSILLECTOMY    . TUBAL LIGATION     Current Outpatient Medications on File Prior to Visit  Medication Sig Dispense Refill  . acetaminophen (TYLENOL) 500 MG tablet Take 500 mg by mouth every 6 (six) hours as needed for moderate pain.    Marland Kitchen aspirin 81 MG tablet Take 81 mg by mouth daily.      . carvedilol (COREG CR) 40 MG 24 hr  capsule TAKE 1 CAPSULE EVERY EVENING 90 capsule 4  . clindamycin (CLINDAGEL) 1 % gel Apply 1 application topically daily as needed (infection).     . clopidogrel (PLAVIX) 75 MG tablet TAKE 1 TABLET DAILY 90 tablet 3  . famotidine (PEPCID) 20 MG tablet Take 20 mg by mouth 2 (two) times daily.    . fexofenadine (ALLEGRA) 180 MG tablet Take 180 mg by mouth daily.    . hydrochlorothiazide (MICROZIDE) 12.5 MG capsule Take 1 capsule (12.5 mg total) by mouth daily. 90 capsule 3  . isosorbide mononitrate (IMDUR) 60 MG 24 hr tablet Take 1 tablet (60 mg total) by mouth daily. 90 tablet 3  . mometasone (ELOCON) 0.1 % cream Apply 1 application topically daily. 45 g 0  . Multiple Vitamins-Minerals (CENTRUM SILVER ADULT 50+ PO) Take 1 tablet by mouth daily.    . predniSONE (DELTASONE) 20 MG tablet Take 1 tablet (20 mg total)  by mouth daily as needed (Lip swelling). 10 tablet 0  . rosuvastatin (CRESTOR) 20 MG tablet Take 1 tablet (20 mg total) by mouth at bedtime. 90 tablet 0  . SYNTHROID 50 MCG tablet TAKE 1 TABLET ALTERNATING WITH ONE AND ONE-HALF TABLETS AS DIRECTED (Patient taking differently: Take 1 tablet by mouth on Sat,Sun,Tues, and Thurs, and take 1.5 tabs on Mon,Wed, and Fri.) 108 tablet 4   No current facility-administered medications on file prior to visit.    Allergies  Allergen Reactions  . Ciprofloxacin Hives  . 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   ALL NARCOTICS- NAUSEA AND VOMITING   . Adhesive [Tape] Itching, Swelling, Rash and Other (See Comments)    Also pain at location  . Codeine Nausea And Vomiting  . Bactrim [Sulfamethoxazole-Trimethoprim]     Muscle aches  . Cephalexin Hives  . Lisinopril Swelling  . Macrobid [Nitrofurantoin Monohyd Macro] Nausea And Vomiting  . Tamiflu [Oseltamivir Phosphate]    Social History   Socioeconomic History  . Marital status: Married    Spouse name: Not on file  . Number of children: Not on file  . Years of education: Not on file  . Highest education level: Not on file  Occupational History  . Not on file  Social Needs  . Financial resource strain: Not on file  . Food insecurity    Worry: Not on file    Inability: Not on file  . Transportation needs    Medical: Not on file    Non-medical: Not on file  Tobacco Use  . Smoking status: Never Smoker  . Smokeless tobacco: Never Used  Substance and Sexual Activity  . Alcohol use: No    Alcohol/week: 0.0 standard drinks  . Drug use: No  . Sexual activity: Not Currently  Lifestyle  . Physical activity    Days per week: Not on file    Minutes per session: Not on file  . Stress: Not on file  Relationships  . Social Herbalist on phone: Not on file    Gets together: Not on file    Attends  religious service: Not on file    Active member of club or organization: Not on file    Attends meetings of clubs or organizations: Not on file    Relationship status: Not on file  . Intimate partner violence    Fear of current or ex partner: Not on file  Emotionally abused: Not on file    Physically abused: Not on file    Forced sexual activity: Not on file  Other Topics Concern  . Not on file  Social History Narrative   Married, 2 children. Retired from [employer] other systems reviewed and are negative.      Objective:   Physical Exam Vitals signs reviewed.  Cardiovascular:     Rate and Rhythm: Normal rate and regular rhythm.     Heart sounds: Normal heart sounds.  Pulmonary:     Effort: Pulmonary effort is normal.     Breath sounds: Normal breath sounds.  Skin:    Findings: No erythema, lesion or rash.           Assessment & Plan:  Other atopic dermatitis  It is difficult to say without seeing the rash but I suspect atopic dermatitis.  The other possibility would be atopic neurodermatitis.  Recommended trying clobetasol cream twice daily as needed for itchy rashes in the future.  I would like to see the rash when it occurs in the future to give a better opinion as to what could be causing it

## 2019-10-12 ENCOUNTER — Other Ambulatory Visit: Payer: Self-pay | Admitting: Family Medicine

## 2019-11-07 ENCOUNTER — Ambulatory Visit (INDEPENDENT_AMBULATORY_CARE_PROVIDER_SITE_OTHER): Payer: Medicare Other | Admitting: Family Medicine

## 2019-11-07 ENCOUNTER — Encounter: Payer: Self-pay | Admitting: Family Medicine

## 2019-11-07 ENCOUNTER — Other Ambulatory Visit: Payer: Self-pay

## 2019-11-07 VITALS — BP 128/84 | HR 90 | Temp 98.1°F | Resp 14 | Ht 67.0 in | Wt 166.0 lb

## 2019-11-07 DIAGNOSIS — I2510 Atherosclerotic heart disease of native coronary artery without angina pectoris: Secondary | ICD-10-CM

## 2019-11-07 DIAGNOSIS — N3001 Acute cystitis with hematuria: Secondary | ICD-10-CM | POA: Diagnosis not present

## 2019-11-07 LAB — URINALYSIS, ROUTINE W REFLEX MICROSCOPIC
Bilirubin Urine: NEGATIVE
Glucose, UA: NEGATIVE
Ketones, ur: NEGATIVE
Nitrite: NEGATIVE
Specific Gravity, Urine: 1.025 (ref 1.001–1.03)
pH: 6 (ref 5.0–8.0)

## 2019-11-07 LAB — MICROSCOPIC MESSAGE

## 2019-11-07 MED ORDER — AMOXICILLIN-POT CLAVULANATE 875-125 MG PO TABS: 1.0000 | ORAL_TABLET | Freq: Two times a day (BID) | ORAL | 0 refills | Status: DC

## 2019-11-07 NOTE — Progress Notes (Signed)
   Subjective:    Patient ID: Lindsey Erickson, female    DOB: 03-May-1932, 85 y.o.   MRN: [id number]  Patient presents for Dysuria (x1 day- burning with urination, frequency, urgency)   Pt here with recurrent UTI symptoms. She has fullness, pressure , urimary frequency, burning sensation that started yesterday.  Feels like her typical urinary tract infections.  She does not feel like she is emptying her bladder all the way.  Sometimes she is only dribbling urine  No fever, no other GI symptoms.  Last urinary tract infection was back in April 2020.  She had strep in her urine back in February and E. coli in April  Flu shot UTD- given Dr Ronnie Derby office  Review Of Systems:  GEN- denies fatigue, fever, weight loss,weakness, recent illness HEENT- denies eye drainage, change in vision, nasal discharge, CVS- denies chest pain, palpitations RESP- denies SOB, cough, wheeze ABD- denies N/V, change in stools, abd pain GU- + dysuria, hematuria, dribbling, incontinence MSK- denies joint pain, muscle aches, injury Neuro- denies headache, dizziness, syncope, seizure activity       Objective:    BP 128/84   Pulse 90   Temp 98.1 F (36.7 C) (Temporal)   Resp 14   Ht 5\' 7"  (1.702 m)   Wt 166 lb (75.3 kg)   SpO2 96%   BMI 26.00 kg/m  GEN- NAD, alert and oriented x3 CVS- RRR, no murmur RESP-CTAB ABD-NABS,soft,NT,ND no CVA tenderness EXT- No edema Pulses- Radial, 2+        Assessment & Plan:      Problem List Items Addressed This Visit    None    Visit Diagnoses    Acute cystitis with hematuria    -  Primary   We will restart Augmentin she is able to tolerate this.  She has multiple antibiotic allergies.  Culture sent.  She did contact urology but because she was not have any symptoms at bedtime they advised her that she can hold off.  No red flags on exam today.   Relevant Orders   Urinalysis, Routine w reflex microscopic (Completed)   Urine Culture      Note: This dictation  was prepared with Dragon dictation along with smaller phrase technology. Any transcriptional errors that result from this process are unintentional.

## 2019-11-07 NOTE — Patient Instructions (Addendum)
F/U as needed  Try Activa yogurt

## 2019-11-08 ENCOUNTER — Ambulatory Visit: Payer: Medicare Other | Admitting: Allergy

## 2019-11-08 ENCOUNTER — Other Ambulatory Visit: Payer: Self-pay | Admitting: Family Medicine

## 2019-11-09 LAB — URINE CULTURE
MICRO NUMBER:: 1203882
SPECIMEN QUALITY:: ADEQUATE

## 2019-11-12 ENCOUNTER — Other Ambulatory Visit: Payer: Self-pay | Admitting: Family Medicine

## 2019-11-30 ENCOUNTER — Other Ambulatory Visit: Payer: Self-pay

## 2019-11-30 ENCOUNTER — Ambulatory Visit (INDEPENDENT_AMBULATORY_CARE_PROVIDER_SITE_OTHER): Payer: Medicare Other | Admitting: Family Medicine

## 2019-11-30 DIAGNOSIS — Z289 Immunization not carried out for unspecified reason: Secondary | ICD-10-CM

## 2019-11-30 NOTE — Progress Notes (Signed)
Subjective:    Patient ID: Lindsey Erickson, female    DOB: [birth year], 86 y.o.   MRN: [id number]  HPI Patient calls today requesting an office visit to discuss the COVID-19 vaccination.  Phone call began at 945.  Phone call concluded at 954.  Patient states that she is available to have the Covid vaccine next week however she is scared to do this.  She has a history of angioedema to unknown triggers although to my knowledge she has not been checked for alpha gal allergy.  She also has a history of hives to unknown triggers.  She denies any history of anaphylaxis.  However due to this allergy, she is very concerned about the vaccine.  I explained to the patient that the vaccine is messenger RNA.  Therefore, compared to other vaccines, the chance of becoming systemically ill due to the vaccine is virtually 0.  It is impossible to predict an allergic reaction to a protein that the patient has never experienced however the chemical make-up of messenger RNA is similar to DNA that is present in our own bodies and therefore I believe the likelihood of allergic reaction would be very low.  The potential for an anaphylactic reaction to the spike protein also would be very low.  However as I explained to the patient the complications to [id number] infection particularly at her age with her medical comorbidities is extremely high.  The patient will have a very high risk of possible mortality which she acquired the infection.  Therefore I believe that the benefit far exceeds the risk.  Furthermore if she gets the vaccination and has an allergic reaction they can treat this with an EpiPen if necessary and Benadryl. Past Medical History:  Diagnosis Date  . Anemia   . Angio-edema   . Arthritis   . Blood transfusion without reported diagnosis 1957  . CAD (coronary artery disease)    a. NSTEMI 2004 with thrombus in Cx and mod RCA. b. Nonischemic nuc 2008. c. mild NSTEMI 01/2013 (trop 0.42) -> cath with unchanged mod LAD  & RCA disease, for continued medical therapy.  . Cancer ([place])   . Cataract   . H/O Hashimoto thyroiditis   . Hyperlipidemia   . HYPERTENSION    a. Also has h/o intermittentl low BP.  Marland Kitchen Hypothyroidism   . Kidney stones   . Myocardial infarction ([place])   . Osteoporosis    no fractures  . Prediabetes   . QT prolongation    a. Prior EKGs QTc 474, but on 4/1 was 524 for unclear reason.  . Recurrent urinary tract infection   . Squamous cell carcinoma of skin 04/17/2015  . Urticaria    Past Surgical History:  Procedure Laterality Date  . ADENOIDECTOMY    . APPENDECTOMY  1999  . Bilateral Arthroscopies of knees    . CARDIAC CATHETERIZATION     Moderate 3 vessel CAD by cath 05/2012  . CHOLECYSTECTOMY  02/2010  . JOINT REPLACEMENT  01/2010   L TKR - allusio  . LEFT HEART CATHETERIZATION WITH CORONARY ANGIOGRAM N/A 05/22/2012   Procedure: LEFT HEART CATHETERIZATION WITH CORONARY ANGIOGRAM;  Surgeon: Sinclair Grooms, MD;  Location[facility] C[facility] LAB;  Service: Cardiovascular;  Laterality: N/A;  . LEFT HEART CATHETERIZATION WITH CORONARY ANGIOGRAM N/A 02/20/2014   Procedure: LEFT HEART CATHETERIZATION WITH CORONARY ANGIOGRAM;  Surgeon: Peter M Martinique, MD;  Location: [facility] CATH LAB;  Service: Cardiovascular;  Laterality: N/A;  . TONSILLECTOMY    .  TUBAL LIGATION     Current Outpatient Medications on File Prior to Visit  Medication Sig Dispense Refill  . acetaminophen (TYLENOL) 500 MG tablet Take 500 mg by mouth every 6 (six) hours as needed for moderate pain.    Marland Kitchen amoxicillin-clavulanate (AUGMENTIN) 875-125 MG tablet Take 1 tablet by mouth 2 (two) times daily. 20 tablet 0  . aspirin 81 MG tablet Take 81 mg by mouth daily.      . carvedilol (COREG CR) 40 MG 24 hr capsule TAKE 1 CAPSULE EVERY EVENING 90 capsule 3  . clindamycin (CLINDAGEL) 1 % gel Apply 1 application topically daily as needed (infection).     . clopidogrel (PLAVIX) 75 MG tablet TAKE 1 TABLET DAILY 90 tablet 3  . famotidine (PEPCID) 20 MG  tablet Take 20 mg by mouth 2 (two) times daily.    . fexofenadine (ALLEGRA) 180 MG tablet Take 180 mg by mouth daily.    . hydrochlorothiazide (MICROZIDE) 12.5 MG capsule Take 1 capsule (12.5 mg total) by mouth daily. 90 capsule 3  . isosorbide mononitrate (IMDUR) 60 MG 24 hr tablet TAKE 1 TABLET DAILY 90 tablet 3  . mometasone (ELOCON) 0.1 % cream Apply 1 application topically daily. 45 g 0  . Multiple Vitamins-Minerals (CENTRUM SILVER ADULT 50+ PO) Take 1 tablet by mouth daily.    . nitroGLYCERIN (NITROSTAT) 0.4 MG SL tablet Place 1 tablet (0.4 mg total) under the tongue every 5 (five) minutes as needed for chest pain. 90 tablet 3  . rosuvastatin (CRESTOR) 20 MG tablet TAKE 1 TABLET AT BEDTIME 90 tablet 3  . SYNTHROID 50 MCG tablet TAKE 1 TABLET ALTERNATING WITH ONE AND ONE-HALF TABLETS AS DIRECTED (Patient taking differently: Take 1 tablet by mouth on Sat,Sun,Tues, and Thurs, and take 1.5 tabs on Mon,Wed, and Fri.) 108 tablet 4   No current facility-administered medications on file prior to visit.   Allergies  Allergen Reactions  . Ciprofloxacin Hives  . 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   ALL NARCOTICS- NAUSEA AND VOMITING   . Adhesive [Tape] Itching, Swelling, Rash and Other (See Comments)    Also pain at location  . Codeine Nausea And Vomiting  . Bactrim [Sulfamethoxazole-Trimethoprim]     Muscle aches  . Cephalexin Hives  . Lisinopril Swelling  . Macrobid [Nitrofurantoin Monohyd Macro] Nausea And Vomiting  . Tamiflu [Oseltamivir Phosphate]    Social History   Socioeconomic History  . Marital status: Married    Spouse name: Not on file  . Number of children: Not on file  . Years of education: Not on file  . Highest education level: Not on file  Occupational History  . Not on file  Tobacco Use  . Smoking status: Never Smoker  . Smokeless tobacco: Never Used  Substance and  Sexual Activity  . Alcohol use: No    Alcohol/week: 0.0 standard drinks  . Drug use: No  . Sexual activity: Not Currently  Other Topics Concern  . Not on file  Social History Narrative   Married, 2 children. Retired from [employer] Strain:   . Difficulty of Paying Living Expenses: Not on file  Food Insecurity:   . Worried About Charity fundraiser in the Last Year: Not on file  . Ran Out of Food in the Last Year: Not on file  Transportation  Needs:   . Lack of Transportation (Medical): Not on file  . Lack of Transportation (Non-Medical): Not on file  Physical Activity:   . Days of Exercise per Week: Not on file  . Minutes of Exercise per Session: Not on file  Stress:   . Feeling of Stress : Not on file  Social Connections:   . Frequency of Communication with Friends and Family: Not on file  . Frequency of Social Gatherings with Friends and Family: Not on file  . Attends Religious Services: Not on file  . Active Member of Clubs or Organizations: Not on file  . Attends Archivist Meetings: Not on file  . Marital Status: Not on file  Intimate Partner Violence:   . Fear of Current or Ex-Partner: Not on file  . Emotionally Abused: Not on file  . Physically Abused: Not on file  . Sexually Abused: Not on file      Review of Systems  All other systems reviewed and are negative.      Objective:   Physical Exam        Assessment & Plan:  COVID-19 virus vaccination not done  Given the widespread nature of this virus and the potential morbidity and mortality associated with it particular in her age demographic with her medical comorbidities I have strongly encouraged the patient to get the vaccination for COVID-19 even despite her history of urticaria and angioedema.  I believe her risk would be extremely low.  Were she to have an allergic reaction, this could be managed with an EpiPen and Benadryl if  necessary therefore I would recommend the vaccine under medical supervision.  However again I believe the benefit far exceeds the risk.

## 2019-12-10 ENCOUNTER — Telehealth: Payer: Self-pay | Admitting: Allergy

## 2019-12-10 MED ORDER — FAMOTIDINE 20 MG PO TABS: 20.0000 mg | ORAL_TABLET | Freq: Two times a day (BID) | ORAL | 1 refills | Status: DC

## 2019-12-10 NOTE — Telephone Encounter (Signed)
Pt called and said that she needed prednisone 20mg . And needs 30pills at a time for a year. Also needs pepcid 20mg . 180 pills for a year. [facility] [phone].

## 2019-12-10 NOTE — Telephone Encounter (Signed)
Can you please advise on this and thank you?

## 2019-12-10 NOTE — Telephone Encounter (Signed)
Patient called back for an update on her medications. Patient informed we are waiting on approval from provider for prednisone. Patient verbalized understanding.

## 2019-12-10 NOTE — Telephone Encounter (Signed)
I have already sent in the [place]. I was unsure about the prednisone as it is not in her active medication list. Please advise on whether this is okay to do or not?

## 2019-12-11 MED ORDER — PREDNISONE 20 MG PO TABS: 20.0000 mg | ORAL_TABLET | Freq: Every day | ORAL | 1 refills | Status: DC

## 2019-12-11 NOTE — Telephone Encounter (Signed)
Sounds good.   Amberly Livas, MD [facility]  

## 2019-12-11 NOTE — Addendum Note (Signed)
Addended by: Lucrezia Starch I on: 12/11/2019 09:09 AM   Modules accepted: Orders

## 2019-12-11 NOTE — Telephone Encounter (Signed)
I don't think that Dr. Nelva Bush would keep someone on prednisone 20mg . I think a emergency pack is fine, but keeping 30 pills on hand seems a bit much. We can send in a 20mg  daily for five days with one refill, but if she is having these swelling episodes so often, we need to consider initiation of Xolair or another steroid sparing medication.   Please confirm whether she is taking 20mg  daily. If so, we will need to wean off over a long period of time.   Salvatore Marvel, MD [facility] [place]

## 2019-12-11 NOTE — Telephone Encounter (Signed)
I spoke with Lindsey Erickson. She told me that she does not take it on a daily basis, only when she has the swelling episodes. She told me that she has had three episodes of lip swelling over the past month. She does have an appointment scheduled with Dr. Nelva Bush in March. I told her about the recommendations you had.

## 2019-12-17 ENCOUNTER — Ambulatory Visit: Payer: Medicare Other | Attending: Internal Medicine

## 2019-12-17 DIAGNOSIS — Z23 Encounter for immunization: Secondary | ICD-10-CM

## 2019-12-17 NOTE — Progress Notes (Signed)
   Covid-19 Vaccination Clinic  Name:  ALANE BOUDREAU    MRN: [id number] DOB: 09/03/1932  12/17/2019  Ms. Arab was observed post Covid-19 immunization for 30 minutes based on pre-vaccination screening without incidence. She was provided with Vaccine Information Sheet and instruction to access the V-Safe system.   Ms. Snopek was instructed to call 911 with any severe reactions post vaccine: Marland Kitchen Difficulty breathing  . Swelling of your face and throat  . A fast heartbeat  . A bad rash all over your body  . Dizziness and weakness    Immunizations Administered    Name Date Dose VIS Date Route   Pfizer COVID-19 Vaccine 12/17/2019 10:04 AM 0.3 mL 11/02/2019 Intramuscular   Manufacturer: [place]   Lot: [id number]   [place]: [id number]

## 2019-12-18 ENCOUNTER — Other Ambulatory Visit: Payer: Self-pay

## 2019-12-18 ENCOUNTER — Ambulatory Visit (INDEPENDENT_AMBULATORY_CARE_PROVIDER_SITE_OTHER): Payer: Medicare Other | Admitting: Family Medicine

## 2019-12-18 ENCOUNTER — Encounter: Payer: Self-pay | Admitting: Family Medicine

## 2019-12-18 VITALS — BP 138/74 | HR 76 | Temp 96.5°F | Resp 16 | Ht 67.0 in | Wt 165.0 lb

## 2019-12-18 DIAGNOSIS — R300 Dysuria: Secondary | ICD-10-CM

## 2019-12-18 LAB — URINALYSIS, ROUTINE W REFLEX MICROSCOPIC
Bilirubin Urine: NEGATIVE
Glucose, UA: NEGATIVE
Hyaline Cast: NONE SEEN /LPF
Ketones, ur: NEGATIVE
Nitrite: NEGATIVE
Specific Gravity, Urine: 1.02 (ref 1.001–1.03)
WBC, UA: >=60 /HPF — AB (ref 0–5)
pH: 6.5 (ref 5.0–8.0)

## 2019-12-18 LAB — MICROSCOPIC MESSAGE

## 2019-12-18 MED ORDER — AMOXICILLIN-POT CLAVULANATE 875-125 MG PO TABS: 1.0000 | ORAL_TABLET | Freq: Two times a day (BID) | ORAL | 0 refills | Status: DC

## 2019-12-18 MED ORDER — ESTRADIOL 0.1 MG/GM VA CREA: 1.0000 | TOPICAL_CREAM | Freq: Every day | VAGINAL | 12 refills | Status: DC

## 2019-12-18 NOTE — Progress Notes (Signed)
Subjective:    Patient ID: Lindsey Erickson, female    DOB: [birth year], 86 y.o.   MRN: [id number]  HPI Patient is an 86 year old Caucasian female who presents today with several days of dysuria, urgency, and frequency.  Urinalysis shows +3 blood and +3 leukocyte esterase.  She has had 5 urinary tract infections in the last 12 months each confirmed with a urine culture.  Most cases have been E. coli that is pansensitive however due to the patient's allergy profile she can only tolerate Augmentin. Past Medical History:  Diagnosis Date  . Anemia   . Angio-edema   . Arthritis   . Blood transfusion without reported diagnosis 1957  . CAD (coronary artery disease)    a. NSTEMI 2004 with thrombus in Cx and mod RCA. b. Nonischemic nuc 2008. c. mild NSTEMI 01/2013 (trop 0.42) -> cath with unchanged mod LAD & RCA disease, for continued medical therapy.  . Cancer ([place])   . Cataract   . H/O Hashimoto thyroiditis   . Hyperlipidemia   . HYPERTENSION    a. Also has h/o intermittentl low BP.  Marland Kitchen Hypothyroidism   . Kidney stones   . Myocardial infarction ([place])   . Osteoporosis    no fractures  . Prediabetes   . QT prolongation    a. Prior EKGs QTc 474, but on 4/1 was 524 for unclear reason.  . Recurrent urinary tract infection   . Squamous cell carcinoma of skin 04/17/2015  . Urticaria    Past Surgical History:  Procedure Laterality Date  . ADENOIDECTOMY    . APPENDECTOMY  1999  . Bilateral Arthroscopies of knees    . CARDIAC CATHETERIZATION     Moderate 3 vessel CAD by cath 05/2012  . CHOLECYSTECTOMY  02/2010  . JOINT REPLACEMENT  01/2010   L TKR - allusio  . LEFT HEART CATHETERIZATION WITH CORONARY ANGIOGRAM N/A 05/22/2012   Procedure: LEFT HEART CATHETERIZATION WITH CORONARY ANGIOGRAM;  Surgeon: Sinclair Grooms, MD;  Location: [facility] CATH LAB;  Service: Cardiovascular;  Laterality: N/A;  . LEFT HEART CATHETERIZATION WITH CORONARY ANGIOGRAM N/A 02/20/2014   Procedure: LEFT HEART CATHETERIZATION  WITH CORONARY ANGIOGRAM;  Surgeon: Peter M Martinique, MD;  Location: [facility] CATH LAB;  Service: Cardiovascular;  Laterality: N/A;  . TONSILLECTOMY    . TUBAL LIGATION     Current Outpatient Medications on File Prior to Visit  Medication Sig Dispense Refill  . acetaminophen (TYLENOL) 500 MG tablet Take 500 mg by mouth every 6 (six) hours as needed for moderate pain.    Marland Kitchen amoxicillin-clavulanate (AUGMENTIN) 875-125 MG tablet Take 1 tablet by mouth 2 (two) times daily. 20 tablet 0  . aspirin 81 MG tablet Take 81 mg by mouth daily.      . carvedilol (COREG CR) 40 MG 24 hr capsule TAKE 1 CAPSULE EVERY EVENING 90 capsule 3  . clindamycin (CLINDAGEL) 1 % gel Apply 1 application topically daily as needed (infection).     . clopidogrel (PLAVIX) 75 MG tablet TAKE 1 TABLET DAILY 90 tablet 3  . famotidine (PEPCID) 20 MG tablet Take 1 tablet (20 mg total) by mouth 2 (two) times daily. 180 tablet 1  . fexofenadine (ALLEGRA) 180 MG tablet Take 180 mg by mouth daily.    . hydrochlorothiazide (MICROZIDE) 12.5 MG capsule Take 1 capsule (12.5 mg total) by mouth daily. 90 capsule 3  . isosorbide mononitrate (IMDUR) 60 MG 24 hr tablet TAKE 1 TABLET DAILY 90 tablet 3  . mometasone (  ELOCON) 0.1 % cream Apply 1 application topically daily. 45 g 0  . Multiple Vitamins-Minerals (CENTRUM SILVER ADULT 50+ PO) Take 1 tablet by mouth daily.    . nitroGLYCERIN (NITROSTAT) 0.4 MG SL tablet Place 1 tablet (0.4 mg total) under the tongue every 5 (five) minutes as needed for chest pain. 90 tablet 3  . predniSONE (DELTASONE) 20 MG tablet Take 1 tablet (20 mg total) by mouth daily with breakfast. 5 tablet 1  . rosuvastatin (CRESTOR) 20 MG tablet TAKE 1 TABLET AT BEDTIME 90 tablet 3  . SYNTHROID 50 MCG tablet TAKE 1 TABLET ALTERNATING WITH ONE AND ONE-HALF TABLETS AS DIRECTED (Patient taking differently: Take 1 tablet by mouth on Sat,Sun,Tues, and Thurs, and take 1.5 tabs on Mon,Wed, and Fri.) 108 tablet 4   No current  facility-administered medications on file prior to visit.   Allergies  Allergen Reactions  . Ciprofloxacin Hives  . 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   ALL NARCOTICS- NAUSEA AND VOMITING   . Adhesive [Tape] Itching, Swelling, Rash and Other (See Comments)    Also pain at location  . Codeine Nausea And Vomiting  . Bactrim [Sulfamethoxazole-Trimethoprim]     Muscle aches  . Cephalexin Hives  . Lisinopril Swelling  . Macrobid [Nitrofurantoin Monohyd Macro] Nausea And Vomiting  . Tamiflu [Oseltamivir Phosphate]    Social History   Socioeconomic History  . Marital status: Married    Spouse name: Not on file  . Number of children: Not on file  . Years of education: Not on file  . Highest education level: Not on file  Occupational History  . Not on file  Tobacco Use  . Smoking status: Never Smoker  . Smokeless tobacco: Never Used  Substance and Sexual Activity  . Alcohol use: No    Alcohol/week: 0.0 standard drinks  . Drug use: No  . Sexual activity: Not Currently  Other Topics Concern  . Not on file  Social History Narrative   Married, 2 children. Retired from [employer] Strain:   . Difficulty of Paying Living Expenses: Not on file  Food Insecurity:   . Worried About Charity fundraiser in the Last Year: Not on file  . Ran Out of Food in the Last Year: Not on file  Transportation Needs:   . Lack of Transportation (Medical): Not on file  . Lack of Transportation (Non-Medical): Not on file  Physical Activity:   . Days of Exercise per Week: Not on file  . Minutes of Exercise per Session: Not on file  Stress:   . Feeling of Stress : Not on file  Social Connections:   . Frequency of Communication with Friends and Family: Not on file  . Frequency of Social Gatherings with Friends and Family: Not on file  . Attends  Religious Services: Not on file  . Active Member of Clubs or Organizations: Not on file  . Attends Archivist Meetings: Not on file  . Marital Status: Not on file  Intimate Partner Violence:   . Fear of Current or Ex-Partner: Not on file  . Emotionally Abused: Not on file  . Physically Abused: Not on file  . Sexually Abused: Not on file     Review of Systems  All other systems reviewed and are negative.      Objective:  Physical Exam Vitals reviewed.  Cardiovascular:     Rate and Rhythm: Normal rate and regular rhythm.  Pulmonary:     Effort: Pulmonary effort is normal.     Breath sounds: Normal breath sounds.  Neurological:     Mental Status: She is alert.           Assessment & Plan:  Dysuria - Plan: Urinalysis, Routine w reflex microscopic, Urinalysis, Routine w reflex microscopic, Urine Culture  Urine again suggest urinary tract infection with hematuria.  Begin Augmentin 875 mg p.o. twice daily for 10 days given the complexity of her bladder infections.  I have also recommended trying Estrace cream 1 applicatorful intravaginally nightly for 2 weeks and then decreasing frequency to 3 nights a week thereafter in an attempt to help prevent urinary tract infections.  If she continues to have these I would recommend a urology consultation for cystoscopy.

## 2019-12-20 LAB — URINE CULTURE
MICRO NUMBER:: 10081573
SPECIMEN QUALITY:: ADEQUATE

## 2020-01-07 ENCOUNTER — Ambulatory Visit: Payer: Medicare Other | Attending: Internal Medicine

## 2020-01-07 DIAGNOSIS — Z23 Encounter for immunization: Secondary | ICD-10-CM

## 2020-01-07 NOTE — Progress Notes (Signed)
   Covid-19 Vaccination Clinic  Name:  Lindsey Erickson    MRN: [id number] DOB: [birth year]  01/07/2020  Lindsey Erickson was observed post Covid-19 immunization for 30 minutes based on pre-vaccination screening without incidence. She was provided with Vaccine Information Sheet and instruction to access the V-Safe system.   Lindsey Erickson was instructed to call 911 with any severe reactions post vaccine: Marland Kitchen Difficulty breathing  . Swelling of your face and throat  . A fast heartbeat  . A bad rash all over your body  . Dizziness and weakness    Immunizations Administered    Name Date Dose VIS Date Route   Pfizer COVID-19 Vaccine 01/07/2020  9:57 AM 0.3 mL 11/02/2019 Intramuscular   Manufacturer: [place]   Lot: [id number]   [place]: [id number]

## 2020-01-29 ENCOUNTER — Other Ambulatory Visit (INDEPENDENT_AMBULATORY_CARE_PROVIDER_SITE_OTHER): Payer: Medicare Other

## 2020-01-29 ENCOUNTER — Other Ambulatory Visit: Payer: Self-pay

## 2020-01-29 DIAGNOSIS — E042 Nontoxic multinodular goiter: Secondary | ICD-10-CM

## 2020-01-29 DIAGNOSIS — R7303 Prediabetes: Secondary | ICD-10-CM

## 2020-01-29 LAB — TSH: TSH: 1.89 u[IU]/mL (ref 0.35–4.50)

## 2020-01-29 LAB — COMPREHENSIVE METABOLIC PANEL
ALT: 16 U/L (ref 0–35)
AST: 19 U/L (ref 0–37)
Albumin: 4 g/dL (ref 3.5–5.2)
Alkaline Phosphatase: 64 U/L (ref 39–117)
BUN: 20 mg/dL (ref 6–23)
CO2: 31 mEq/L (ref 19–32)
Calcium: 9.5 mg/dL (ref 8.4–10.5)
Chloride: 99 mEq/L (ref 96–112)
Creatinine, Ser: 0.74 mg/dL (ref 0.40–1.20)
GFR: 74.5 mL/min (ref >60.00)
Glucose, Bld: 102 mg/dL — ABNORMAL HIGH (ref 70–99)
Potassium: 4.3 mEq/L (ref 3.5–5.1)
Sodium: 135 mEq/L (ref 135–145)
Total Bilirubin: 0.5 mg/dL (ref 0.2–1.2)
Total Protein: 7 g/dL (ref 6.0–8.3)

## 2020-01-29 LAB — T4, FREE: Free T4: 1.03 ng/dL (ref 0.60–1.60)

## 2020-01-29 LAB — HEMOGLOBIN A1C: Hgb A1c MFr Bld: 5.9 % (ref 4.6–6.5)

## 2020-01-31 ENCOUNTER — Other Ambulatory Visit: Payer: Self-pay

## 2020-01-31 ENCOUNTER — Ambulatory Visit (INDEPENDENT_AMBULATORY_CARE_PROVIDER_SITE_OTHER): Payer: Medicare Other | Admitting: Endocrinology

## 2020-01-31 ENCOUNTER — Encounter: Payer: Self-pay | Admitting: Endocrinology

## 2020-01-31 VITALS — BP 110/68 | HR 72 | Ht 67.0 in

## 2020-01-31 DIAGNOSIS — E042 Nontoxic multinodular goiter: Secondary | ICD-10-CM

## 2020-01-31 DIAGNOSIS — R7303 Prediabetes: Secondary | ICD-10-CM

## 2020-01-31 DIAGNOSIS — E7800 Pure hypercholesterolemia, unspecified: Secondary | ICD-10-CM | POA: Diagnosis not present

## 2020-01-31 NOTE — Progress Notes (Signed)
Patient ID: Lindsey Erickson, female   DOB: 28-Jul-1932, 86 y.o.   MRN: [id number]   Reason for Appointment:  Endocrinology followup     History of Present Illness:   She initially had a multinodular goiter in 1982 She has had 4 biopsies of her thyroid nodules subsequently with the last one in 2019 which were benign Her biopsies had shown a few follicular cells and lymphocytes Last biopsy showed benign hyperplastic nodule category II  Probable hypothyroidism:   Most likely she initially had been on thyroid suppression for the goiter rather than true hypothyroidism although previous records do indicate Hashimoto thyroiditis  The treatments that the patient  had taken include Synthroid 88 mcg for several years  This was reduced to 50 g in 11/16 because of low normal TSH of 0.36.           Because of her symptoms of feeling a little tired and cold even with normal TSH in the past her dose was increased from the 50 mcg dose with additional half tablet 3 times a week of her 50 mcg levothyroxine  Subsequent TSH levels have been normal consistently  She does not feel tired recently more than usual No other new symptoms and weight is stable  She is very regular with taking her levothyroxine before breakfast as prescribed .   Lab Results  Component Value Date   TSH 1.89 01/29/2020   TSH 2.26 07/31/2019   TSH 2.77 11/20/2018   FREET4 1.03 01/29/2020   FREET4 0.91 07/31/2019   FREET4 1.04 11/20/2018    GOITER: She has had a long-standing enlargement, mostly in the midline This has been stable Again has no symptoms of local pressure, difficulty swallowing or choking  Her thyroid enlargement has been unchanged in the last few years  She had a biopsy done elsewhere in 9/19 ordered by her PCP and this was again benign, diagnosis was benign hyperplastic thyroid nodule category 2   PREDIABETES:   Her last fasting glucose previously has been as high as 129 in 2017  December At that time glucose tolerance test did not show her glucose to be abnormal at 2 hours although it was 204 at 1 hour  She has not been on any treatment She is generally trying to watch her diet with calorie restriction Not able to exercise much because of knee pain  A1c has been consistently in the upper normal range, has been as low as 5.4 and again 5.9 Fasting glucose now 102 compared to 113  Previously has taken prednisone for her angioedema but usually takes 1 tablet with relief   Weight is stable  Wt Readings from Last 3 Encounters:  12/18/19 165 lb (74.8 kg)  11/07/19 166 lb (75.3 kg)  09/13/19 166 lb (75.3 kg)    Lab Results  Component Value Date   HGBA1C 5.9 01/29/2020   HGBA1C 5.9 07/31/2019   HGBA1C 5.9 11/20/2018   Lab Results  Component Value Date   LDLCALC 70 06/25/2019   CREATININE 0.74 01/29/2020     Allergies as of 01/31/2020      Reactions   Ciprofloxacin Hives   Other Other (See Comments)   Frozen Blood Plasma-caused patient to crash / cardiac arrest / CAN NEVER TAKE THIS AGAIN-PER MD'S.  ALL NARCOTICS- NAUSEA AND VOMITING    Adhesive [tape] Itching, Swelling, Rash, Other (See Comments)   Also pain at location   Codeine Nausea And Vomiting   Bactrim [sulfamethoxazole-trimethoprim]    Muscle  aches   Cephalexin Hives   Lisinopril Swelling   Macrobid [nitrofurantoin Monohyd Macro] Nausea And Vomiting   Tamiflu [oseltamivir Phosphate]       Medication List       Accurate as of January 31, 2020  9:02 AM. If you have any questions, ask your nurse or doctor.        STOP taking these medications   amoxicillin-clavulanate 875-125 MG tablet Commonly known as: AUGMENTIN Stopped by: Elayne Snare, MD   clindamycin 1 % gel Commonly known as: CLINDAGEL Stopped by: Elayne Snare, MD   estradiol 0.1 MG/GM vaginal cream Commonly known as: ESTRACE VAGINAL Stopped by: Elayne Snare, MD   predniSONE 20 MG tablet Commonly known as: Deltasone Stopped  by: Elayne Snare, MD     TAKE these medications   acetaminophen 500 MG tablet Commonly known as: TYLENOL Take 500 mg by mouth every 6 (six) hours as needed for moderate pain.   aspirin 81 MG tablet Take 81 mg by mouth daily.   carvedilol 40 MG 24 hr capsule Commonly known as: COREG CR TAKE 1 CAPSULE EVERY EVENING   CENTRUM SILVER ADULT 50+ PO Take 1 tablet by mouth daily.   clopidogrel 75 MG tablet Commonly known as: PLAVIX TAKE 1 TABLET DAILY   famotidine 20 MG tablet Commonly known as: PEPCID Take 1 tablet (20 mg total) by mouth 2 (two) times daily.   fexofenadine 180 MG tablet Commonly known as: ALLEGRA Take 180 mg by mouth daily.   hydrochlorothiazide 12.5 MG capsule Commonly known as: MICROZIDE Take 1 capsule (12.5 mg total) by mouth daily.   isosorbide mononitrate 60 MG 24 hr tablet Commonly known as: IMDUR TAKE 1 TABLET DAILY   mometasone 0.1 % cream Commonly known as: Elocon Apply 1 application topically daily.   nitroGLYCERIN 0.4 MG SL tablet Commonly known as: NITROSTAT Place 1 tablet (0.4 mg total) under the tongue every 5 (five) minutes as needed for chest pain.   rosuvastatin 20 MG tablet Commonly known as: CRESTOR TAKE 1 TABLET AT BEDTIME   Synthroid 50 MCG tablet Generic drug: levothyroxine Take 50 mcg by mouth daily before breakfast. Take 1 tablet by mouth on Sun,Tues,Thurs, and Sat, and take 1.5 tabs on Mon, Wed, and Fri. What changed: Another medication with the same name was removed. Continue taking this medication, and follow the directions you see here. Changed by: Elayne Snare, MD       Allergies:  Allergies  Allergen Reactions  . Ciprofloxacin Hives  . 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   ALL NARCOTICS- NAUSEA AND VOMITING   . Adhesive [Tape] Itching, Swelling, Rash and Other (See Comments)    Also pain at location  . Codeine Nausea And  Vomiting  . Bactrim [Sulfamethoxazole-Trimethoprim]     Muscle aches  . Cephalexin Hives  . Lisinopril Swelling  . Macrobid [Nitrofurantoin Monohyd Macro] Nausea And Vomiting  . Tamiflu [Oseltamivir Phosphate]     Past Medical History:  Diagnosis Date  . Anemia   . Angio-edema   . Arthritis   . Blood transfusion without reported diagnosis 1957  . CAD (coronary artery disease)    a. NSTEMI 2004 with thrombus in Cx and mod RCA. b. Nonischemic nuc 2008. c. mild NSTEMI 01/2013 (trop 0.42) -> cath with unchanged mod LAD & RCA disease, for continued medical therapy.  . Cancer ([place])   . Cataract   .  H/O Hashimoto thyroiditis   . Hyperlipidemia   . HYPERTENSION    a. Also has h/o intermittentl low BP.  Marland Kitchen Hypothyroidism   . Kidney stones   . Myocardial infarction ([place])   . Osteoporosis    no fractures  . Prediabetes   . QT prolongation    a. Prior EKGs QTc 474, but on 4/1 was 524 for unclear reason.  . Recurrent urinary tract infection   . Squamous cell carcinoma of skin 04/17/2015  . Urticaria     Past Surgical History:  Procedure Laterality Date  . ADENOIDECTOMY    . APPENDECTOMY  1999  . Bilateral Arthroscopies of knees    . CARDIAC CATHETERIZATION     Moderate 3 vessel CAD by cath 05/2012  . CHOLECYSTECTOMY  02/2010  . JOINT REPLACEMENT  01/2010   L TKR - allusio  . LEFT HEART CATHETERIZATION WITH CORONARY ANGIOGRAM N/A 05/22/2012   Procedure: LEFT HEART CATHETERIZATION WITH CORONARY ANGIOGRAM;  Surgeon: Sinclair Grooms, MD;  Location[facility] C[facility] LAB;  Service: Cardiovascular;  Laterality: N/A;  . LEFT HEART CATHETERIZATION WITH CORONARY ANGIOGRAM N/A 02/20/2014   Procedure: LEFT HEART CATHETERIZATION WITH CORONARY ANGIOGRAM;  Surgeon: Peter M Martinique, MD;  Location[facility] CATH LAB;  Service: Cardiovascular;  Laterality: N/A;  . TONSILLECTOMY    . TUBAL LIGATION      Family History  Problem Relation Age of Onset  . Allergies Father   . Cancer Father   . Coronary artery disease  Mother 36  . Heart disease Mother   . CAD Sister 74  . Cancer Sister   . AAA (abdominal aortic aneurysm) Sister   . Hypertension Sister   . Hyperlipidemia Sister   . Diabetes Sister   . Heart disease Sister   . Diabetes Maternal Aunt   . Allergic rhinitis Neg Hx   . Asthma Neg Hx   . Eczema Neg Hx   . Urticaria Neg Hx     Social History:  reports that she has never smoked. She has never used smokeless tobacco. She reports that she does not drink alcohol or use drugs.  REVIEW Of SYSTEMS:  History of CAD, followed regularly by cardiologist  Hypertension: Is on a 2 drug regimen, Followed by PCP and cardiologist   BP Readings from Last 3 Encounters:  01/31/20 110/68  12/18/19 138/74  11/07/19 128/84      Examination:   BP 110/68 (BP Location: Left Arm, Patient Position: Sitting, Cuff Size: Normal)   Pulse 72   Ht 5\' 7"  (1.702 m)   SpO2 98%   BMI 25.84 kg/m    Thyroid is enlarged with significant prominence in the midline area On palpation also most of the enlargement is in the isthmus and slightly laterally, size at least 3-3-1/2 times normal Thyroid is slightly firmer on the left with slight nodularity  Lower neck circumference is 41.5 centimeters  No lymphadenopathy in the neck    Assessments/Plan   Impaired fasting glucose:   Her blood sugars are stable with A1c 5.9 Fasting glucose better at 102 compared to 113 Although she does have mild glucose intolerance she is not a candidate for Metformin at her age She understands the need for healthy consistent diet  She can now follow-up annually  Multinodular goiter, long-standing, clinically stable with no change in local pressure symptoms Has mostly a large about 5.8 cm nodule on the right side on previous ultrasounds  Examination shows a goiter to be about the same in  size as measured by neck circumference We will continue to follow  LIPIDS: Well controlled with Crestor, followed by PCP  Mild  hypothyroidism: No fatigue TSH is consistently normal  She is taking brand name Synthroid 50 g, 8-1/2 tablets a week and is very regular with this  No changes needed and she can now follow-up annually unless she has increasing fatigue     Elayne Snare 01/31/2020, 9:02 AM

## 2020-02-01 ENCOUNTER — Ambulatory Visit (INDEPENDENT_AMBULATORY_CARE_PROVIDER_SITE_OTHER): Payer: Medicare Other | Admitting: Family Medicine

## 2020-02-01 VITALS — BP 124/70 | HR 80 | Temp 97.6°F | Resp 18 | Ht 67.0 in | Wt 166.0 lb

## 2020-02-01 DIAGNOSIS — R399 Unspecified symptoms and signs involving the genitourinary system: Secondary | ICD-10-CM | POA: Diagnosis not present

## 2020-02-01 LAB — URINALYSIS, ROUTINE W REFLEX MICROSCOPIC
Bilirubin Urine: NEGATIVE
Glucose, UA: NEGATIVE
Hyaline Cast: NONE SEEN /LPF
Ketones, ur: NEGATIVE
Nitrite: NEGATIVE
Protein, ur: NEGATIVE
Specific Gravity, Urine: 1.015 (ref 1.001–1.03)
pH: 6.5 (ref 5.0–8.0)

## 2020-02-01 LAB — MICROSCOPIC MESSAGE

## 2020-02-01 MED ORDER — AMOXICILLIN-POT CLAVULANATE 875-125 MG PO TABS: 1.0000 | ORAL_TABLET | Freq: Two times a day (BID) | ORAL | 0 refills | Status: DC

## 2020-02-01 NOTE — Progress Notes (Signed)
Subjective:    Patient ID: Lindsey Erickson, female    DOB: 10-28-1932, 86 y.o.   MRN: [id number]  HPI  12/18/19 Patient is an 86 year old Caucasian female who presents today with several days of dysuria, urgency, and frequency.  Urinalysis shows +3 blood and +3 leukocyte esterase.  She has had 5 urinary tract infections in the last 12 months each confirmed with a urine culture.  Most cases have been E. coli that is pansensitive however due to the patient's allergy profile she can only tolerate Augmentin.  At that time, my plan was:  Urine again suggest urinary tract infection with hematuria.  Begin Augmentin 875 mg p.o. twice daily for 10 days given the complexity of her bladder infections.  I have also recommended trying Estrace cream 1 applicatorful intravaginally nightly for 2 weeks and then decreasing frequency to 3 nights a week thereafter in an attempt to help prevent urinary tract infections.  If she continues to have these I would recommend a urology consultation for cystoscopy.  02/01/20 Patient states that 2 days ago, she developed dysuria, frequency, urgency as well as some hesitancy.  Urinalysis today shows trace blood and +3 leukocyte esterase.  She has had 6 documented urinary tract infections confirmed with a urine culture over the last 12 months.  This will be the seventh.  She never started using estrogen cream that I prescribed at her last visit given her family history of breast cancer.  She is concerned about using estrogen even topically. Past Medical History:  Diagnosis Date  . Anemia   . Angio-edema   . Arthritis   . Blood transfusion without reported diagnosis 1957  . CAD (coronary artery disease)    a. NSTEMI 2004 with thrombus in Cx and mod RCA. b. Nonischemic nuc 2008. c. mild NSTEMI 01/2013 (trop 0.42) -> cath with unchanged mod LAD & RCA disease, for continued medical therapy.  . Cancer ([place])   . Cataract   . H/O Hashimoto thyroiditis   . Hyperlipidemia   .  HYPERTENSION    a. Also has h/o intermittentl low BP.  Marland Kitchen Hypothyroidism   . Kidney stones   . Myocardial infarction ([place])   . Osteoporosis    no fractures  . Prediabetes   . QT prolongation    a. Prior EKGs QTc 474, but on 4/1 was 524 for unclear reason.  . Recurrent urinary tract infection   . Squamous cell carcinoma of skin 04/17/2015  . Urticaria    Past Surgical History:  Procedure Laterality Date  . ADENOIDECTOMY    . APPENDECTOMY  1999  . Bilateral Arthroscopies of knees    . CARDIAC CATHETERIZATION     Moderate 3 vessel CAD by cath 05/2012  . CHOLECYSTECTOMY  02/2010  . JOINT REPLACEMENT  01/2010   L TKR - allusio  . LEFT HEART CATHETERIZATION WITH CORONARY ANGIOGRAM N/A 05/22/2012   Procedure: LEFT HEART CATHETERIZATION WITH CORONARY ANGIOGRAM;  Surgeon: Sinclair Grooms, MD;  Location[facility] CATH LAB;  Service: Cardiovascular;  Laterality: N/A;  . LEFT HEART CATHETERIZATION WITH CORONARY ANGIOGRAM N/A 02/20/2014   Procedure: LEFT HEART CATHETERIZATION WITH CORONARY ANGIOGRAM;  Surgeon: Peter M Martinique, MD;  [facility] C[facility] LAB;  Service: Cardiovascular;  Laterality: N/A;  . TONSILLECTOMY    . TUBAL LIGATION     Current Outpatient Medications on File Prior to Visit  Medication Sig Dispense Refill  . acetaminophen (TYLENOL) 500 MG tablet Take 500 mg by mouth every 6 (six) hours  as needed for moderate pain.    Marland Kitchen aspirin 81 MG tablet Take 81 mg by mouth daily.      . carvedilol (COREG CR) 40 MG 24 hr capsule TAKE 1 CAPSULE EVERY EVENING 90 capsule 3  . clopidogrel (PLAVIX) 75 MG tablet TAKE 1 TABLET DAILY 90 tablet 3  . famotidine (PEPCID) 20 MG tablet Take 1 tablet (20 mg total) by mouth 2 (two) times daily. 180 tablet 1  . fexofenadine (ALLEGRA) 180 MG tablet Take 180 mg by mouth daily.    . hydrochlorothiazide (MICROZIDE) 12.5 MG capsule Take 1 capsule (12.5 mg total) by mouth daily. 90 capsule 3  . isosorbide mononitrate (IMDUR) 60 MG 24 hr tablet TAKE 1 TABLET DAILY 90 tablet  3  . levothyroxine (SYNTHROID) 50 MCG tablet Take 50 mcg by mouth daily before breakfast. Take 1 tablet by mouth on Sun,Tues,Thurs, and Sat, and take 1.5 tabs on Mon, Wed, and Fri.    . mometasone (ELOCON) 0.1 % cream Apply 1 application topically daily. 45 g 0  . Multiple Vitamins-Minerals (CENTRUM SILVER ADULT 50+ PO) Take 1 tablet by mouth daily.    . nitroGLYCERIN (NITROSTAT) 0.4 MG SL tablet Place 1 tablet (0.4 mg total) under the tongue every 5 (five) minutes as needed for chest pain. 90 tablet 3  . rosuvastatin (CRESTOR) 20 MG tablet TAKE 1 TABLET AT BEDTIME 90 tablet 3   No current facility-administered medications on file prior to visit.   Allergies  Allergen Reactions  . Ciprofloxacin Hives  . 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   ALL NARCOTICS- NAUSEA AND VOMITING   . Adhesive [Tape] Itching, Swelling, Rash and Other (See Comments)    Also pain at location  . Codeine Nausea And Vomiting  . Bactrim [Sulfamethoxazole-Trimethoprim]     Muscle aches  . Cephalexin Hives  . Lisinopril Swelling  . Macrobid [Nitrofurantoin Monohyd Macro] Nausea And Vomiting  . Tamiflu [Oseltamivir Phosphate]    Social History   Socioeconomic History  . Marital status: Married    Spouse name: Not on file  . Number of children: Not on file  . Years of education: Not on file  . Highest education level: Not on file  Occupational History  . Not on file  Tobacco Use  . Smoking status: Never Smoker  . Smokeless tobacco: Never Used  Substance and Sexual Activity  . Alcohol use: No    Alcohol/week: 0.0 standard drinks  . Drug use: No  . Sexual activity: Not Currently  Other Topics Concern  . Not on file  Social History Narrative   Married, 2 children. Retired from [employer] Strain:   . Difficulty of Paying Living Expenses:   Food  Insecurity:   . Worried About Charity fundraiser in the Last Year:   . Arboriculturist in the Last Year:   Transportation Needs:   . Film/video editor (Medical):   Marland Kitchen Lack of Transportation (Non-Medical):   Physical Activity:   . Days of Exercise per Week:   . Minutes of Exercise per Session:   Stress:   . Feeling of Stress :   Social Connections:   . Frequency of Communication with Friends and Family:   . Frequency of Social Gatherings with Friends and Family:   . Attends Religious Services:   .  Active Member of Clubs or Organizations:   . Attends Archivist Meetings:   Marland Kitchen Marital Status:   Intimate Partner Violence:   . Fear of Current or Ex-Partner:   . Emotionally Abused:   Marland Kitchen Physically Abused:   . Sexually Abused:      Review of Systems  All other systems reviewed and are negative.      Objective:   Physical Exam Vitals reviewed.  Cardiovascular:     Rate and Rhythm: Normal rate and regular rhythm.  Pulmonary:     Effort: Pulmonary effort is normal.     Breath sounds: Normal breath sounds.  Neurological:     Mental Status: She is alert.           Assessment & Plan:  UTI symptoms - Plan: Urinalysis, Routine w reflex microscopic, Urine Culture Treat the patient with Augmentin 875 mg twice daily for 10 days.  However given the frequent recurrent urinary tract infections, I will consult all with as the patient does not feel comfortable using Estrace cream.  I would like their opinion on whether there is anything else we can do to help prevent her urinary tract infections.

## 2020-02-03 LAB — URINE CULTURE
MICRO NUMBER:: 10245754
SPECIMEN QUALITY:: ADEQUATE

## 2020-02-06 ENCOUNTER — Ambulatory Visit (INDEPENDENT_AMBULATORY_CARE_PROVIDER_SITE_OTHER): Payer: Medicare Other | Admitting: Allergy

## 2020-02-06 ENCOUNTER — Other Ambulatory Visit: Payer: Self-pay

## 2020-02-06 ENCOUNTER — Encounter: Payer: Self-pay | Admitting: Allergy

## 2020-02-06 VITALS — BP 112/72 | HR 67 | Temp 97.2°F | Resp 16

## 2020-02-06 DIAGNOSIS — T783XXD Angioneurotic edema, subsequent encounter: Secondary | ICD-10-CM

## 2020-02-06 NOTE — Progress Notes (Signed)
Follow-up Note  RE: Lindsey Erickson MRN: [id number] DOB: [birth year] Date of Office Visit: 02/06/2020   History of present illness: Lindsey Erickson is a 86 y.o. female presenting today for follow-up of idiopathic angioedema.  She was last seen in the office on 05/09/2019 by myself.  Since this visit she has had for further episodes of lip swelling.  On 09/18/2019 she states around 10 PM after having dinner that consisted of rice, corn, green beans, carrots and a roll she developed lip swelling.  On 10/07/2019 in the morning she had lower lip swelling.  She states that day she had a honey bun for breakfast that she always has.  She taken Allegra and Pepcid.  She states the night before she had ham sandwich and use roll.  On 1120 6 in the evening after having Thanksgiving dinner consisting of a spiced [place], dressing, potato salad, macaroni and cheese, causing congealed salad she developed lip swelling.  On 12/07/2018 1 in the morning she noted lip swelling as well as the upper roof and her tongue felt irritated.  She had had biscuits with a strawberry jelly.  She took her Allegra and Pepcid she also took prednisone since this was a more severe type symptoms.  She states she has been taking the Allegra and Pepcid pretty consistently since the January episode.  She has been able to wean down off of the Allegra and Pepcid and will take it periodically when she does have swelling episodes.  She definitely feels that these symptoms are related to spices and tomato based products.  She tries to avoid tomato based products but it is hard to avoid as spices.  She otherwise has not had any major health changes, surgeries or hospitalizations.  Review of systems: Review of Systems  Constitutional: Negative.   HENT: Negative.   Eyes: Negative.   Respiratory: Negative.   Cardiovascular: Negative.   Gastrointestinal: Negative.   Musculoskeletal: Negative.   Skin: Negative.   Neurological: Negative.      All other systems negative unless noted above in HPI  Past medical/social/surgical/family history have been reviewed and are unchanged unless specifically indicated below.  No changes  Medication List: Current Outpatient Medications  Medication Sig Dispense Refill  . acetaminophen (TYLENOL) 500 MG tablet Take 500 mg by mouth every 6 (six) hours as needed for moderate pain.    Marland Kitchen amoxicillin-clavulanate (AUGMENTIN) 875-125 MG tablet Take 1 tablet by mouth 2 (two) times daily. 20 tablet 0  . aspirin 81 MG tablet Take 81 mg by mouth daily.      . carvedilol (COREG CR) 40 MG 24 hr capsule TAKE 1 CAPSULE EVERY EVENING 90 capsule 3  . clopidogrel (PLAVIX) 75 MG tablet TAKE 1 TABLET DAILY 90 tablet 3  . famotidine (PEPCID) 20 MG tablet Take 1 tablet (20 mg total) by mouth 2 (two) times daily. 180 tablet 1  . fexofenadine (ALLEGRA) 180 MG tablet Take 180 mg by mouth daily.    . hydrochlorothiazide (MICROZIDE) 12.5 MG capsule Take 1 capsule (12.5 mg total) by mouth daily. 90 capsule 3  . isosorbide mononitrate (IMDUR) 60 MG 24 hr tablet TAKE 1 TABLET DAILY 90 tablet 3  . levothyroxine (SYNTHROID) 50 MCG tablet Take 50 mcg by mouth daily before breakfast. Take 1 tablet by mouth on Sun,Tues,Thurs, and Sat, and take 1.5 tabs on Mon, Wed, and Fri.    . mometasone (ELOCON) 0.1 % cream Apply 1 application topically daily. 45 g 0  .  Multiple Vitamins-Minerals (CENTRUM SILVER ADULT 50+ PO) Take 1 tablet by mouth daily.    . nitroGLYCERIN (NITROSTAT) 0.4 MG SL tablet Place 1 tablet (0.4 mg total) under the tongue every 5 (five) minutes as needed for chest pain. 90 tablet 3  . rosuvastatin (CRESTOR) 20 MG tablet TAKE 1 TABLET AT BEDTIME 90 tablet 3   No current facility-administered medications for this visit.     Known medication allergies: Allergies  Allergen Reactions  . Ciprofloxacin Hives  . Other Other (See Comments)    Frozen Blood Plasma-caused patient to crash / cardiac arrest / CAN NEVER  TAKE THIS AGAIN-PER MD'S.   ALL NARCOTICS- NAUSEA AND VOMITING   . Adhesive [Tape] Itching, Swelling, Rash and Other (See Comments)    Also pain at location  . Codeine Nausea And Vomiting  . Bactrim [Sulfamethoxazole-Trimethoprim]     Muscle aches  . Cephalexin Hives  . Lisinopril Swelling  . Macrobid [Nitrofurantoin Monohyd Macro] Nausea And Vomiting  . Tamiflu [Oseltamivir Phosphate]      Physical examination: Blood pressure 112/72, pulse 67, temperature (!) 97.2 F (36.2 C), temperature source Temporal, resp. rate 16, SpO2 98 %.  General: Alert, interactive, in no acute distress. HEENT: PERRLA, TMs pearly gray, turbinates non-edematous without discharge, post-pharynx non erythematous. Neck: Supple without lymphadenopathy. Lungs: Clear to auscultation without wheezing, rhonchi or rales. {no increased work of breathing. CV: Normal S1, S2 without murmurs. Abdomen: Nondistended, nontender. Skin: Warm and dry, without lesions or rashes. Extremities:  No clubbing, cyanosis or edema. Neuro:   Grossly intact.  Diagnositics/Labs: None today  Assessment and plan: Patient Instructions  Idiopathic Angioedema (lip swelling)     - episodic angioedema can be due to variety of different triggers.  ACE-I (lisinopril) is a known medication that can cause angioedema of the oral cavity.   The swelling can continue for months even despite stopping the medication.  However you have been off lisinopril for a long-time at this time.  This category of medications should not be taken again in the future.   Labs for swelling were largely unremarkable except for low level sensitivity to elm tree pollen.   You have identified several food items that have been consistent including spices thus will obtain serum IgE levels for these.       - swelling episodes are becoming less severe and less frequent     - continue current regimen with allegra and pepcid for flare-ups     - reserve prednisone for more  severe swelling events   Follow-up 6 months or sooner if needed       No follow-ups on file.  I appreciate the opportunity to take part in [place] care. Please do not hesitate to contact me with questions.  Sincerely,   Prudy Feeler, MD Allergy/Immunology [facility] [place] [facility] [place]

## 2020-02-06 NOTE — Patient Instructions (Addendum)
Idiopathic Angioedema (lip swelling)     - episodic angioedema can be due to variety of different triggers.  ACE-I (lisinopril) is a known medication that can cause angioedema of the oral cavity.   The swelling can continue for months even despite stopping the medication.  However you have been off lisinopril for a long-time at this time.  This category of medications should not be taken again in the future.   Labs for swelling were largely unremarkable except for low level sensitivity to elm tree pollen.   You have identified several food items that have been consistent including spices thus will obtain serum IgE levels for these.       - swelling episodes are becoming less severe and less frequent     - continue current regimen with allegra and pepcid for flare-ups     - reserve prednisone for more severe swelling events   Follow-up 6 months or sooner if needed

## 2020-02-11 LAB — ALLERGEN, CINNAMON, RF220: Allergen Cinnamon IgE: <0.1 kU/L

## 2020-02-11 LAB — ALLERGEN, GARLIC, F47: Allergen Garlic IgE: <0.1 kU/L

## 2020-02-11 LAB — XANTHAN GUM IGE
Class Interpretation: 0
Xanthan Gum, IgE*: <0.35 kU/L (ref <0.35)

## 2020-02-11 LAB — ALLERGEN, BAKERS YEAST, F45: F045-IgE Yeast: <0.1 kU/L

## 2020-02-11 LAB — ALLERGEN,GRN PEPPER,PAPRIKA,F218: Paprika IgE: 0.14 kU/L — AB

## 2020-02-11 LAB — ALLERGEN, NUTMEG, RF282: F282-IgE Nutmeg: <0.1 kU/L

## 2020-02-12 ENCOUNTER — Other Ambulatory Visit: Payer: Self-pay

## 2020-02-18 ENCOUNTER — Telehealth: Payer: Self-pay

## 2020-02-18 MED ORDER — EPINEPHRINE 0.3 MG/0.3ML IJ SOAJ: 0.3000 mg | Freq: Once | INTRAMUSCULAR | 1 refills | Status: DC

## 2020-02-18 NOTE — Telephone Encounter (Signed)
Refill has been sent to requested pharmacy. Called patient and informed. Patient verbalized understanding.

## 2020-02-18 NOTE — Telephone Encounter (Signed)
Patient called requesting a refill on her epi pen. Patient states the hospital sent the last one she received.  Please send to [facility]

## 2020-03-12 ENCOUNTER — Other Ambulatory Visit: Payer: Self-pay | Admitting: Allergy

## 2020-03-12 NOTE — Telephone Encounter (Signed)
Patient is requesting that you refill this prescription. Is this a medication that you want to refill for her?

## 2020-03-12 NOTE — Telephone Encounter (Signed)
Yes she can get this refilled at twice a day dosing as on prescription

## 2020-03-18 ENCOUNTER — Other Ambulatory Visit: Payer: Self-pay | Admitting: *Deleted

## 2020-03-18 ENCOUNTER — Telehealth: Payer: Self-pay | Admitting: *Deleted

## 2020-03-18 NOTE — Telephone Encounter (Signed)
Ok great.  She can call back when she gets close to running out for more.

## 2020-03-18 NOTE — Telephone Encounter (Signed)
Called patient and inquired about Prednisone. She states that she will check since she has so many medications and will call back if she needs more.

## 2020-03-18 NOTE — Telephone Encounter (Signed)
Patient called returning Ashleigh's call. Patient states that she has 9 tablets. She only had 2 episodes this year. Patient states that what she has now expires in October and she has an office visit in September. Patient just wanted to make sure she had Prednisone if she needed it so she said to go ahead and refill the prednisone.

## 2020-03-18 NOTE — Telephone Encounter (Signed)
Patient's pharmacy [facility] is requesting a 90 day refill for Prednisone. Do you find this to be appropriate for the patient?

## 2020-03-18 NOTE — Telephone Encounter (Signed)
Called and advised to patient. Patient verbalized understanding and will call back when she needs more prednisone. Patient verbalized understanding.

## 2020-03-18 NOTE — Telephone Encounter (Signed)
I typically will give her prednisone packs in the office.  Can you see if she has needed to use all of these first before we send a refill?

## 2020-03-21 ENCOUNTER — Other Ambulatory Visit: Payer: Self-pay | Admitting: Family Medicine

## 2020-04-07 ENCOUNTER — Other Ambulatory Visit: Payer: Self-pay | Admitting: Endocrinology

## 2020-04-14 ENCOUNTER — Other Ambulatory Visit: Payer: Self-pay | Admitting: Physician Assistant

## 2020-04-15 ENCOUNTER — Emergency Department (HOSPITAL_COMMUNITY)
Admission: EM | Admit: 2020-04-15 | Discharge: 2020-04-15 | Disposition: A | Discharge status: Home / Self Care | Payer: Medicare Other | Attending: Emergency Medicine | Admitting: Emergency Medicine

## 2020-04-15 ENCOUNTER — Emergency Department (HOSPITAL_COMMUNITY): Payer: Medicare Other

## 2020-04-15 ENCOUNTER — Encounter (HOSPITAL_COMMUNITY): Payer: Self-pay | Admitting: Emergency Medicine

## 2020-04-15 DIAGNOSIS — R0789 Other chest pain: Secondary | ICD-10-CM | POA: Insufficient documentation

## 2020-04-15 DIAGNOSIS — T07XXXA Unspecified multiple injuries, initial encounter: Secondary | ICD-10-CM | POA: Diagnosis present

## 2020-04-15 DIAGNOSIS — Z23 Encounter for immunization: Secondary | ICD-10-CM | POA: Diagnosis not present

## 2020-04-15 DIAGNOSIS — W109XXA Fall (on) (from) unspecified stairs and steps, initial encounter: Secondary | ICD-10-CM | POA: Diagnosis not present

## 2020-04-15 DIAGNOSIS — Y999 Unspecified external cause status: Secondary | ICD-10-CM | POA: Diagnosis not present

## 2020-04-15 DIAGNOSIS — Z79899 Other long term (current) drug therapy: Secondary | ICD-10-CM | POA: Insufficient documentation

## 2020-04-15 DIAGNOSIS — S40011A Contusion of right shoulder, initial encounter: Secondary | ICD-10-CM | POA: Diagnosis not present

## 2020-04-15 DIAGNOSIS — S32512A Fracture of superior rim of left pubis, initial encounter for closed fracture: Secondary | ICD-10-CM | POA: Insufficient documentation

## 2020-04-15 DIAGNOSIS — M25541 Pain in joints of right hand: Secondary | ICD-10-CM | POA: Diagnosis not present

## 2020-04-15 DIAGNOSIS — R519 Headache, unspecified: Secondary | ICD-10-CM | POA: Diagnosis not present

## 2020-04-15 DIAGNOSIS — S0101XA Laceration without foreign body of scalp, initial encounter: Secondary | ICD-10-CM | POA: Diagnosis not present

## 2020-04-15 DIAGNOSIS — Y9389 Activity, other specified: Secondary | ICD-10-CM | POA: Diagnosis not present

## 2020-04-15 DIAGNOSIS — Y92019 Unspecified place in single-family (private) house as the place of occurrence of the external cause: Secondary | ICD-10-CM | POA: Insufficient documentation

## 2020-04-15 DIAGNOSIS — W19XXXA Unspecified fall, initial encounter: Secondary | ICD-10-CM

## 2020-04-15 DIAGNOSIS — S0083XA Contusion of other part of head, initial encounter: Secondary | ICD-10-CM | POA: Diagnosis not present

## 2020-04-15 LAB — COMPREHENSIVE METABOLIC PANEL
ALT: 23 U/L (ref 0–44)
AST: 27 U/L (ref 15–41)
Albumin: 3.6 g/dL (ref 3.5–5.0)
Alkaline Phosphatase: 61 U/L (ref 38–126)
Anion gap: 13 (ref 5–15)
BUN: 22 mg/dL (ref 8–23)
CO2: 21 mmol/L — ABNORMAL LOW (ref 22–32)
Calcium: 9.1 mg/dL (ref 8.9–10.3)
Chloride: 97 mmol/L — ABNORMAL LOW (ref 98–111)
Creatinine, Ser: 0.87 mg/dL (ref 0.44–1.00)
GFR calc Af Amer: >60 mL/min (ref >60)
GFR calc non Af Amer: >60 mL/min (ref >60)
Glucose, Bld: 171 mg/dL — ABNORMAL HIGH (ref 70–99)
Potassium: 4 mmol/L (ref 3.5–5.1)
Sodium: 131 mmol/L — ABNORMAL LOW (ref 135–145)
Total Bilirubin: 0.6 mg/dL (ref 0.3–1.2)
Total Protein: 6.7 g/dL (ref 6.5–8.1)

## 2020-04-15 LAB — URINALYSIS, ROUTINE W REFLEX MICROSCOPIC
Bilirubin Urine: NEGATIVE
Glucose, UA: NEGATIVE mg/dL
Hgb urine dipstick: NEGATIVE
Ketones, ur: NEGATIVE mg/dL
Leukocytes,Ua: NEGATIVE
Nitrite: NEGATIVE
Protein, ur: NEGATIVE mg/dL
Specific Gravity, Urine: 1.019 (ref 1.005–1.030)
pH: 5 (ref 5.0–8.0)

## 2020-04-15 LAB — SAMPLE TO BLOOD BANK

## 2020-04-15 LAB — CBC
HCT: 40.7 % (ref 36.0–46.0)
Hemoglobin: 13.7 g/dL (ref 12.0–15.0)
MCH: 33.7 pg (ref 26.0–34.0)
MCHC: 33.7 g/dL (ref 30.0–36.0)
MCV: 100 fL (ref 80.0–100.0)
Platelets: 222 10*3/uL (ref 150–400)
RBC: 4.07 MIL/uL (ref 3.87–5.11)
RDW: 12.3 % (ref 11.5–15.5)
WBC: 12.4 10*3/uL — ABNORMAL HIGH (ref 4.0–10.5)
nRBC: 0 % (ref 0.0–0.2)

## 2020-04-15 LAB — ETHANOL: Alcohol, Ethyl (B): <10 mg/dL (ref <10)

## 2020-04-15 LAB — PROTIME-INR
INR: 1 (ref 0.8–1.2)
Prothrombin Time: 12.8 seconds (ref 11.4–15.2)

## 2020-04-15 LAB — LACTIC ACID, PLASMA: Lactic Acid, Venous: 2.1 mmol/L (ref 0.5–1.9)

## 2020-04-15 MED ORDER — LIDOCAINE-EPINEPHRINE (PF) 2 %-1:200000 IJ SOLN
10.0000 mL | Freq: Once | INTRAMUSCULAR | Status: AC
  Administered 2020-04-15: 10 mL via INTRADERMAL
  Filled 2020-04-15: qty 20

## 2020-04-15 MED ORDER — ACETAMINOPHEN 500 MG PO TABS
1000.0000 mg | ORAL_TABLET | Freq: Once | ORAL | Status: AC
  Administered 2020-04-15: 1000 mg via ORAL
  Filled 2020-04-15: qty 2

## 2020-04-15 MED ORDER — TETANUS-DIPHTH-ACELL PERTUSSIS 5-2.5-18.5 LF-MCG/0.5 IM SUSP
0.5000 mL | Freq: Once | INTRAMUSCULAR | Status: AC
  Administered 2020-04-15: 0.5 mL via INTRAMUSCULAR
  Filled 2020-04-15: qty 0.5

## 2020-04-15 NOTE — Progress Notes (Signed)
Orthopedic Tech Progress Note Patient Details:  [name] 1931/12/11 [id number] Level 2 trauma  Patient ID: Lindsey Erickson, female   DOB: [birth year], 86 y.o.   MRN: [id number]   DEBORAN MCKINNEY 04/15/2020, 6:45 PM

## 2020-04-15 NOTE — ED Triage Notes (Signed)
BIB EMS from home - Lvl 2 - Fall on thinners - Plavix. Patient fell down approx 3 steps while trying to get away from bee. Presents with hematoma and laceration to head. Given 4mg  Zofran en route for nausea. VSS.

## 2020-04-15 NOTE — ED Notes (Signed)
Verbalized understanding of DC instructions and follow up care 

## 2020-04-15 NOTE — ED Notes (Signed)
Presents with bruising to R shoulder, bruising and swelling to R hand. Reports L hip pain.

## 2020-04-15 NOTE — Discharge Instructions (Addendum)
Thank you for seeing Lindsey Erickson today! You have a superior pubic ramus fracture and may bear weight on it using your walker. You may take tylenol 1000mg  four times daily at the most, but I recommend 650mg  every 6 hours.  Your stitch is absorable and will not need to be removed.

## 2020-04-16 ENCOUNTER — Encounter: Payer: Self-pay | Admitting: Allergy

## 2020-04-16 NOTE — ED Provider Notes (Signed)
[place] EMERGENCY DEPARTMENT Provider Note   CSN: [id number] Arrival date & time: 04/15/20  1810     History Chief Complaint  Patient presents with  . Fall    Lindsey Erickson is a 86 y.o. female.  HPI     86 year old femaLE on Plavix presents as a level 2 fall.  She was trying to get away from a BA when she fell approximately 3 feet, hitting her head.  Reports headache, neck pain, right chest wall pain, pain to her right hand and shoulder, and pain to her left groin.  Denies loss of consciousness, nausea, vomiting.  Otherwise has been in normal state of health. No numbness, weakness.   History reviewed. No pertinent past medical history.  There are no problems to display for this patient.   History reviewed. No pertinent surgical history.   OB History   No obstetric history on file.     No family history on file.  Social History   Tobacco Use  . Smoking status: Never Smoker  . Smokeless tobacco: Never Used  Substance Use Topics  . Alcohol use: Not Currently  . Drug use: Never    Home Medications Prior to Admission medications   Medication Sig Start Date End Date Taking? Authorizing Provider  Ascorbic Acid (VITAMIN C) 1000 MG tablet Take 1,000 mg by mouth daily.   Yes [provider]  carvedilol (COREG CR) 40 MG 24 hr capsule Take 40 mg by mouth daily. 02/10/20  Yes [provider]  clopidogrel (PLAVIX) 75 MG tablet Take 75 mg by mouth daily. 03/21/20  Yes [provider]  conjugated estrogens (PREMARIN) vaginal cream Place 1 Applicatorful vaginally 2 (two) times a week. Tuesday and Saturday.   Yes [provider]  fexofenadine (ALLEGRA) 180 MG tablet Take 180 mg by mouth as needed (lip swelling).  03/12/20  Yes [provider]  hydrochlorothiazide (MICROZIDE) 12.5 MG capsule Take 12.5 mg by mouth daily. 01/15/20  Yes [provider]  isosorbide mononitrate (IMDUR) 60 MG 24 hr tablet Take 60  mg by mouth daily. 04/12/20  Yes [provider]  Multiple Vitamins-Minerals (CENTRUM SILVER 50+WOMEN PO) Take 1 tablet by mouth daily.   Yes [provider]  OVER THE COUNTER MEDICATION Take 1 capsule by mouth daily. Cranberry D - [place]   Yes [provider]  PEPCID 20 MG tablet Take 20 mg by mouth as needed (angioedema).  12/10/19  Yes [provider]  predniSONE (DELTASONE) 20 MG tablet Take 20 mg by mouth as needed (angioedema).  12/19/19  Yes [provider]  rosuvastatin (CRESTOR) 20 MG tablet Take 20 mg by mouth at bedtime. 02/06/20  Yes [provider]  saccharomyces boulardii (FLORASTOR) 250 MG capsule Take 250 mg by mouth daily.   Yes [provider]  SYNTHROID 50 MCG tablet Take 50 mcg by mouth See admin instructions. 73mcg on M, W, F. And 68mcg on T, Thurs, Sat, Sun 04/07/20  Yes [provider]  trimethoprim (TRIMPEX) 100 MG tablet Take 100 mg by mouth daily. 04/07/20  Yes [provider]  amoxicillin-clavulanate (AUGMENTIN) 500-125 MG tablet Take 1 tablet by mouth 2 (two) times daily. 03/24/20   [provider]    Allergies    Sulfur  Review of Systems   Review of Systems  Constitutional: Negative for fever.  HENT: Negative for sore throat.   Eyes: Negative for visual disturbance.  Respiratory: Negative for cough and shortness of breath.  Cardiovascular: Positive for chest pain.  Gastrointestinal: Negative for abdominal pain, nausea and vomiting.  Genitourinary: Negative for difficulty urinating.  Musculoskeletal: Positive for neck pain. Negative for back pain.  Skin: Negative for rash.  Neurological: Positive for headaches. Negative for syncope.    Physical Exam Updated Vital Signs BP (!) 121/51 (BP Location: Left Arm)   Pulse 81   Temp 98.2 F (36.8 C) (Oral)   Resp (!) 21   Ht 5\' 6"  (1.676 m)   Wt 72.6 kg   SpO2 96%   BMI 25.82 kg/m   Physical Exam Vitals and nursing note  reviewed.  Constitutional:      General: She is not in acute distress.    Appearance: She is well-developed. She is not diaphoretic.  HENT:     Head: Normocephalic.     Comments: Contusion around right eye, hematoma temporal area with 1cm laceration hemostatic Eyes:     Extraocular Movements: Extraocular movements intact.     Conjunctiva/sclera: Conjunctivae normal.     Pupils: Pupils are equal, round, and reactive to light.  Cardiovascular:     Rate and Rhythm: Normal rate and regular rhythm.     Heart sounds: Normal heart sounds. No murmur. No friction rub. No gallop.   Pulmonary:     Effort: Pulmonary effort is normal. No respiratory distress.     Breath sounds: Normal breath sounds. No wheezing or rales.  Chest:     Chest wall: Tenderness (right sided) present.  Abdominal:     General: There is no distension.     Palpations: Abdomen is soft.     Tenderness: There is no abdominal tenderness. There is no guarding.  Musculoskeletal:        General: Tenderness (right 4th MCP hematoma, tenderness, tenderness and contusion to right shoulder, tenderness CSpine) present.     Cervical back: Normal range of motion.  Skin:    General: Skin is warm and dry.     Findings: No erythema or rash.  Neurological:     Mental Status: She is alert and oriented to person, place, and time.     ED Results / Procedures / Treatments   Labs (all labs ordered are listed, but only abnormal results are displayed) Labs Reviewed  COMPREHENSIVE METABOLIC PANEL - Abnormal; Notable for the following components:      Result Value   Sodium 131 (*)    Chloride 97 (*)    CO2 21 (*)    Glucose, Bld 171 (*)    All other components within normal limits  CBC - Abnormal; Notable for the following components:   WBC 12.4 (*)    All other components within normal limits  LACTIC ACID, PLASMA - Abnormal; Notable for the following components:   Lactic Acid, Venous 2.1 (*)    All other components within normal  limits  ETHANOL  URINALYSIS, ROUTINE W REFLEX MICROSCOPIC  PROTIME-INR  I-STAT CHEM 8, ED  SAMPLE TO BLOOD BANK    EKG None  Radiology DG Shoulder Right  Result Date: 04/15/2020 CLINICAL DATA:  [place] down stairs EXAM: RIGHT SHOULDER - 2+ VIEW COMPARISON:  None. FINDINGS: Mild AC joint degenerative change.  No fracture or malalignment. IMPRESSION: No acute osseous abnormality Electronically Signed   By: Donavan Foil M.D.   On: 04/15/2020 20:01   CT HEAD WO CONTRAST  Result Date: 04/15/2020 CLINICAL DATA:  [address].  Hit head.  Right sided scalp hematoma. EXAM: CT HEAD WITHOUT CONTRAST CT CERVICAL SPINE WITHOUT CONTRAST  TECHNIQUE: Multidetector CT imaging of the head and cervical spine was performed following the standard protocol without intravenous contrast. Multiplanar CT image reconstructions of the cervical spine were also generated. COMPARISON:  None. FINDINGS: CT HEAD FINDINGS Brain: Age related cerebral atrophy, ventriculomegaly and periventricular white matter disease. No extra-axial fluid collections are identified. No CT findings for acute hemispheric infarction or intracranial hemorrhage. No mass lesions. The brainstem and cerebellum are normal. Vascular: Vascular calcifications but no aneurysm or hyperdense vessels. Skull: No skull fracture or bone lesions. Moderate hyperostosis frontalis interna. Sinuses/Orbits: The paranasal sinuses and mastoid air cells are clear. The globes are intact. Other: Moderate-sized right parietal scalp hematoma and probable laceration. No underlying skull fracture. CT CERVICAL SPINE FINDINGS Alignment: Normal Skull base and vertebrae: No acute fracture. No primary bone lesion or focal pathologic process. Soft tissues and spinal canal: No prevertebral fluid or swelling. No visible canal hematoma. Disc levels: The spinal canal is fairly generous. No significant spinal or foraminal stenosis. Upper chest: The lung apices are grossly clear. 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 Numerous nodules and calcifications. This has been previously evaluated with ultrasound in 2019. Recommend clinical correlation with previous biopsy, if performed. IMPRESSION: 1. Age related cerebral atrophy, ventriculomegaly and periventricular white matter disease. 2. No acute intracranial findings or skull fracture. 3. Moderate-sized right parietal scalp hematoma and probable laceration. 4. Normal alignment of the cervical vertebral bodies and no acute cervical spine fracture. 5. 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 Recommend clinical correlation with previous evaluation in 2019. Electronically Signed   By: Marijo Sanes M.D.   On: 04/15/2020 19:49   CT Chest Wo Contrast  Result Date: 04/15/2020 CLINICAL DATA:  Fall EXAM: CT CHEST WITHOUT CONTRAST TECHNIQUE: Multidetector CT imaging of the chest was performed following the standard protocol without IV contrast. COMPARISON:  2012 FINDINGS: Cardiovascular: Normal heart size. No pericardial effusion. Coronary artery calcification. Thoracic aortic calcification. Mediastinum/Nodes: No enlarged lymph nodes identified. Partially imaged enlarged and heterogeneous thyroid, previously evaluated by ultrasound. This displaces the trachea to the left. Lungs/Pleura: No consolidation or mass. No pleural effusion or pneumothorax. Upper Abdomen: No acute abnormality. Musculoskeletal: No acute fracture. Degenerative changes of the thoracic spine. IMPRESSION: No evidence of traumatic injury. Aortic atherosclerosis. Electronically Signed   By: Macy Mis M.D.   On: 04/15/2020 19:44   CT CERVICAL SPINE WO CONTRAST  Result Date: 04/15/2020 CLINICAL DATA:  [place] [address].  Hit head.  Right sided scalp hematoma. EXAM: CT HEAD WITHOUT CONTRAST CT CERVICAL SPINE WITHOUT CONTRAST TECHNIQUE: Multidetector CT imaging of the head and cervical spine was performed following the standard protocol without intravenous contrast.  Multiplanar CT image reconstructions of the cervical spine were also generated. COMPARISON:  None. FINDINGS: CT HEAD FINDINGS Brain: Age related cerebral atrophy, ventriculomegaly and periventricular white matter disease. No extra-axial fluid collections are identified. No CT findings for acute hemispheric infarction or intracranial hemorrhage. No mass lesions. The brainstem and cerebellum are normal. Vascular: Vascular calcifications but no aneurysm or hyperdense vessels. Skull: No skull fracture or bone lesions. Moderate hyperostosis frontalis interna. Sinuses/Orbits: The paranasal sinuses and mastoid air cells are clear. The globes are intact. Other: Moderate-sized right parietal scalp hematoma and probable laceration. No underlying skull fracture. CT CERVICAL SPINE FINDINGS Alignment: Normal Skull base and vertebrae: No acute fracture. No primary bone lesion or focal pathologic process. Soft tissues and spinal canal: No prevertebral fluid or swelling. No visible canal hematoma. Disc levels: The spinal canal is fairly generous. No  significant spinal or foraminal stenosis. Upper chest: The lung apices are grossly clear. 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 Numerous nodules and calcifications. This has been previously evaluated with ultrasound in 2019. Recommend clinical correlation with previous biopsy, if performed. IMPRESSION: 1. Age related cerebral atrophy, ventriculomegaly and periventricular white matter disease. 2. No acute intracranial findings or skull fracture. 3. Moderate-sized right parietal scalp hematoma and probable laceration. 4. Normal alignment of the cervical vertebral bodies and no acute cervical spine fracture. 5. 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 Recommend clinical correlation with previous evaluation in 2019. Electronically Signed   By: Marijo Sanes M.D.   On: 04/15/2020 19:49   DG Pelvis Portable  Result Date: 04/15/2020 CLINICAL DATA:   [place] [address], anticoagulated EXAM: PORTABLE PELVIS 1-2 VIEWS COMPARISON:  None. FINDINGS: Single frontal view of the pelvis demonstrates no fractures. Alignment is anatomic. Symmetrical bilateral hip osteoarthritis. Prominent multilevel spondylosis greatest at the lumbosacral junction. Diffuse vascular calcifications. IMPRESSION: 1. Degenerative changes, no acute fracture. Electronically Signed   By: Randa Ngo M.D.   On: 04/15/2020 18:59   DG Chest Port 1 View  Result Date: 04/15/2020 CLINICAL DATA:  [place], anticoagulated EXAM: PORTABLE CHEST 1 VIEW COMPARISON:  05/02/2018 FINDINGS: Single frontal view of the chest demonstrates an unremarkable cardiac silhouette. No airspace disease, effusion, or pneumothorax. No acute bony abnormalities. IMPRESSION: 1. No acute intrathoracic process. Electronically Signed   By: Randa Ngo M.D.   On: 04/15/2020 18:59   DG Hand Complete Right  Result Date: 04/15/2020 CLINICAL DATA:  [place] [address] EXAM: RIGHT HAND - COMPLETE 3+ VIEW COMPARISON:  None. FINDINGS: Frontal, oblique, and lateral views of the right hand are obtained. There are no acute displaced fractures. Multifocal osteoarthritis is seen, greatest in the first carpometacarpal joint, first interphalangeal joint, and second distal interphalangeal joint. There is prominent dorsal soft tissue swelling overlying the ulnar aspect of the dorsum of the hand at the level of the metacarpophalangeal joints. IMPRESSION: 1. Multifocal osteoarthritis.  No acute fracture. Electronically Signed   By: Randa Ngo M.D.   On: 04/15/2020 19:00   DG HIP UNILAT WITH PELVIS 1V LEFT  Result Date: 04/15/2020 CLINICAL DATA:  [place] [address] down stairs with left hip pain EXAM: DG HIP (WITH OR WITHOUT PELVIS) 1V*L* COMPARISON:  04/15/2020 FINDINGS: Left SI joint is intact. Suspected acute minimally displaced fracture involving the left superior pubic ramus and symphysis. Left femoral head demonstrates no fracture or malalignment. IMPRESSION: Suspected  acute minimally displaced fracture of the left superior pubic ramus and pubic symphysis. Electronically Signed   By: Donavan Foil M.D.   On: 04/15/2020 20:03    Procedures .Marland KitchenLaceration Repair  Date/Time: 04/16/2020 9:13 AM Performed by: Gareth Morgan, MD Authorized by: Gareth Morgan, MD   Consent:    Consent obtained:  Verbal   Consent given by:  Patient   Risks discussed:  Infection and pain Laceration details:    Location:  Scalp   Scalp location:  R temporal   Length (cm):  1 Repair type:    Repair type:  Simple Pre-procedure details:    Preparation:  Patient was prepped and draped in usual sterile fashion Treatment:    Area cleansed with:  Saline   Irrigation solution:  Sterile saline Skin repair:    Repair method:  Sutures   Suture size:  5-0   Suture material:  Fast-absorbing gut   (including critical care time)  Medications Ordered in ED Medications  Tdap (BOOSTRIX) injection 0.5 mL (0.5 mLs  Intramuscular Given 04/15/20 1958)  acetaminophen (TYLENOL) tablet 1,000 mg (1,000 mg Oral Given 04/15/20 1958)  lidocaine-EPINEPHrine (XYLOCAINE W/EPI) 2 %-1:200000 (PF) injection 10 mL (10 mLs Intradermal Given 04/15/20 2214)    ED Course  I have reviewed the triage vital signs and the nursing notes.  Pertinent labs & imaging results that were available during my care of the patient were reviewed by me and considered in my medical decision making (see chart for details).    MDM Rules/Calculators/A&P                      86 year old female on Plavix presents as a level 2 fall.  CT head, CSpine and chest without fracture of bleeding. XR right hand and shoulder without fx.  No signs of intraabdominal trauma.  XR hip shows concern for superior pubic rami and possible pubic symphysis.    Laceration repaired as above.  Recommend WBAT with walker for pelvic fx. Declines rx for narcotic pain medication, will take tylenol for pain. Has a walker at home.  Has seen Dr.  Maureen Ralphs with Orthopedics and will follow up with [facility]. Patient discharged in stable condition with understanding of reasons to return.   Final Clinical Impression(s) / ED Diagnoses Final diagnoses:  Fall  Closed fracture of superior ramus of left pubis, initial encounter [facility])    Rx / DC Orders ED Discharge Orders    None       Gareth Morgan, MD 04/16/20 [phone]

## 2020-04-18 ENCOUNTER — Other Ambulatory Visit: Payer: Self-pay | Admitting: Family Medicine

## 2020-04-18 DIAGNOSIS — S32512A Fracture of superior rim of left pubis, initial encounter for closed fracture: Secondary | ICD-10-CM

## 2020-04-25 ENCOUNTER — Ambulatory Visit (INDEPENDENT_AMBULATORY_CARE_PROVIDER_SITE_OTHER): Payer: Medicare Other | Admitting: Family Medicine

## 2020-04-25 ENCOUNTER — Other Ambulatory Visit: Payer: Self-pay

## 2020-04-25 VITALS — BP 120/70 | HR 77 | Temp 96.5°F

## 2020-04-25 DIAGNOSIS — S32599A Other specified fracture of unspecified pubis, initial encounter for closed fracture: Secondary | ICD-10-CM

## 2020-04-25 DIAGNOSIS — S32599S Other specified fracture of unspecified pubis, sequela: Secondary | ICD-10-CM

## 2020-04-25 LAB — COMPLETE METABOLIC PANEL WITH GFR
AG Ratio: 1.6 (calc) (ref 1.0–2.5)
ALT: 16 U/L (ref 6–29)
AST: 21 U/L (ref 10–35)
Albumin: 4.2 g/dL (ref 3.6–5.1)
Alkaline phosphatase (APISO): 66 U/L (ref 37–153)
BUN: 23 mg/dL (ref 7–25)
CO2: 28 mmol/L (ref 20–32)
Calcium: 9.5 mg/dL (ref 8.6–10.4)
Chloride: 94 mmol/L — ABNORMAL LOW (ref 98–110)
Creat: 0.72 mg/dL (ref 0.60–0.88)
GFR, Est African American: 89 mL/min/{1.73_m2} (ref >=60)
GFR, Est Non African American: 76 mL/min/{1.73_m2} (ref >=60)
Globulin: 2.6 g/dL (calc) (ref 1.9–3.7)
Glucose, Bld: 107 mg/dL — ABNORMAL HIGH (ref 65–99)
Potassium: 4.5 mmol/L (ref 3.5–5.3)
Sodium: 130 mmol/L — ABNORMAL LOW (ref 135–146)
Total Bilirubin: 0.9 mg/dL (ref 0.2–1.2)
Total Protein: 6.8 g/dL (ref 6.1–8.1)

## 2020-04-25 LAB — CBC WITH DIFFERENTIAL/PLATELET
Absolute Monocytes: 624 cells/uL (ref 200–950)
Basophils Absolute: 47 cells/uL (ref 0–200)
Basophils Relative: 0.6 %
Eosinophils Absolute: 71 cells/uL (ref 15–500)
Eosinophils Relative: 0.9 %
HCT: 36 % (ref 35.0–45.0)
Hemoglobin: 12.5 g/dL (ref 11.7–15.5)
Lymphs Abs: 1304 cells/uL (ref 850–3900)
MCH: 34.2 pg — ABNORMAL HIGH (ref 27.0–33.0)
MCHC: 34.7 g/dL (ref 32.0–36.0)
MCV: 98.6 fL (ref 80.0–100.0)
MPV: 9.4 fL (ref 7.5–12.5)
Monocytes Relative: 7.9 %
Neutro Abs: 5854 cells/uL (ref 1500–7800)
Neutrophils Relative %: 74.1 %
Platelets: 329 10*3/uL (ref 140–400)
RBC: 3.65 10*6/uL — ABNORMAL LOW (ref 3.80–5.10)
RDW: 12.3 % (ref 11.0–15.0)
Total Lymphocyte: 16.5 %
WBC: 7.9 10*3/uL (ref 3.8–10.8)

## 2020-04-25 NOTE — Progress Notes (Signed)
Subjective:    Patient ID: Lindsey Erickson, female    DOB: Dec 06, 1931, 86 y.o.   MRN: [id number]  Patient slipped and fell on May 25 and landed on concrete striking her right temple, her right shoulder and suffering a fracture to her superior left pubic ramus.  She went to the hospital.  Patient had CT scan of the neck, chest, and brain that showed no traumatic injury.  She had an x-ray of the hips and pelvis that did show a minimally displaced left superior pubic ramus fracture otherwise normal.  Patient was discharged home with pain medication and then weightbearing as tolerated.  She is able to ambulate with a walker.  She denies any cough or chest pain or pleurisy or shortness of breath.  She denies any leg swelling or erythema in her legs or pain in her legs.  There is no evidence of DVT on exam.  She denies any symptoms of urinary tract infection.  Home health physical therapy is going to be coming out to her home next week to start to work with her. Past Medical History:  Diagnosis Date  . Anemia   . Angio-edema   . Arthritis   . Blood transfusion without reported diagnosis 1957  . CAD (coronary artery disease)    a. NSTEMI 2004 with thrombus in Cx and mod RCA. b. Nonischemic nuc 2008. c. mild NSTEMI 01/2013 (trop 0.42) -> cath with unchanged mod LAD & RCA disease, for continued medical therapy.  . Cancer ([place])   . Cataract   . H/O Hashimoto thyroiditis   . Hyperlipidemia   . HYPERTENSION    a. Also has h/o intermittentl low BP.  Marland Kitchen Hypothyroidism   . Kidney stones   . Myocardial infarction ([place])   . Osteoporosis    no fractures  . Prediabetes   . QT prolongation    a. Prior EKGs QTc 474, but on 4/1 was 524 for unclear reason.  . Recurrent urinary tract infection   . Squamous cell carcinoma of skin 04/17/2015  . Urticaria    Past Surgical History:  Procedure Laterality Date  . ADENOIDECTOMY    . APPENDECTOMY  1999  . Bilateral Arthroscopies of knees    . CARDIAC  CATHETERIZATION     Moderate 3 vessel CAD by cath 05/2012  . CHOLECYSTECTOMY  02/2010  . JOINT REPLACEMENT  01/2010   L TKR - allusio  . LEFT HEART CATHETERIZATION WITH CORONARY ANGIOGRAM N/A 05/22/2012   Procedure: LEFT HEART CATHETERIZATION WITH CORONARY ANGIOGRAM;  Surgeon: Sinclair Grooms, MD;  Location: [facility] CATH LAB;  Service: Cardiovascular;  Laterality: N/A;  . LEFT HEART CATHETERIZATION WITH CORONARY ANGIOGRAM N/A 02/20/2014   Procedure: LEFT HEART CATHETERIZATION WITH CORONARY ANGIOGRAM;  Surgeon: Peter M Martinique, MD;  Location: [facility] CATH LAB;  Service: Cardiovascular;  Laterality: N/A;  . TONSILLECTOMY    . TUBAL LIGATION     Current Outpatient Medications on File Prior to Visit  Medication Sig Dispense Refill  . acetaminophen (TYLENOL) 500 MG tablet Take 500 mg by mouth every 6 (six) hours as needed for moderate pain.    . Ascorbic Acid (VITAMIN C) 1000 MG tablet Take 1,000 mg by mouth daily.    Marland Kitchen aspirin 81 MG tablet Take 81 mg by mouth daily.      . carvedilol (COREG CR) 40 MG 24 hr capsule TAKE 1 CAPSULE EVERY EVENING 90 capsule 3  . clopidogrel (PLAVIX) 75 MG tablet TAKE 1 TABLET DAILY 90 tablet  3  . conjugated estrogens (PREMARIN) vaginal cream Place 1 Applicatorful vaginally 2 (two) times a week. Tuesday and Saturday.    . hydrochlorothiazide (MICROZIDE) 12.5 MG capsule TAKE 1 CAPSULE DAILY 90 capsule 3  . isosorbide mononitrate (IMDUR) 60 MG 24 hr tablet TAKE 1 TABLET DAILY 90 tablet 3  . Multiple Vitamins-Minerals (CENTRUM SILVER 50+WOMEN PO) Take 1 tablet by mouth daily.    Marland Kitchen OVER THE COUNTER MEDICATION Take 1 capsule by mouth daily. Cranberry D - [place]    . rosuvastatin (CRESTOR) 20 MG tablet TAKE 1 TABLET AT BEDTIME 90 tablet 3  . saccharomyces boulardii (FLORASTOR) 250 MG capsule Take 250 mg by mouth daily.    Marland Kitchen SYNTHROID 50 MCG tablet TAKE 1 TABLET ALTERNATING WITH ONE AND ONE-HALF TABLETS AS DIRECTED 108 tablet 3  . trimethoprim (TRIMPEX) 100 MG tablet Take 100 mg by  mouth daily.    Marland Kitchen amoxicillin-clavulanate (AUGMENTIN) 875-125 MG tablet Take 1 tablet by mouth 2 (two) times daily. 20 tablet 0  . carvedilol (COREG CR) 40 MG 24 hr capsule Take 40 mg by mouth daily.    . clopidogrel (PLAVIX) 75 MG tablet Take 75 mg by mouth daily.    . famotidine (PEPCID) 20 MG tablet Take 1 tablet (20 mg total) by mouth 2 (two) times daily. (Patient not taking: Reported on 04/25/2020) 180 tablet 1  . fexofenadine (ALLEGRA) 180 MG tablet TAKE 1 TABLET TWICE A DAY (Patient not taking: Reported on 04/25/2020) 180 tablet 3  . fexofenadine (ALLEGRA) 180 MG tablet Take 180 mg by mouth as needed (lip swelling).     . hydrochlorothiazide (MICROZIDE) 12.5 MG capsule Take 12.5 mg by mouth daily.    . isosorbide mononitrate (IMDUR) 60 MG 24 hr tablet Take 60 mg by mouth daily.    . mometasone (ELOCON) 0.1 % cream Apply 1 application topically daily. (Patient not taking: Reported on 04/25/2020) 45 g 0  . Multiple Vitamins-Minerals (CENTRUM SILVER ADULT 50+ PO) Take 1 tablet by mouth daily.    . nitroGLYCERIN (NITROSTAT) 0.4 MG SL tablet Place 1 tablet (0.4 mg total) under the tongue every 5 (five) minutes as needed for chest pain. (Patient not taking: Reported on 04/25/2020) 90 tablet 3  . PEPCID 20 MG tablet Take 20 mg by mouth as needed (angioedema).     . predniSONE (DELTASONE) 20 MG tablet Take 20 mg by mouth as needed (angioedema).     . rosuvastatin (CRESTOR) 20 MG tablet Take 20 mg by mouth at bedtime.    Marland Kitchen SYNTHROID 50 MCG tablet Take 50 mcg by mouth See admin instructions. 40mcg on M, W, F. And 65mcg on T, Thurs, Sat, Sun     No current facility-administered medications on file prior to visit.   Allergies  Allergen Reactions  . Ciprofloxacin Hives  . 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   ALL NARCOTICS- NAUSEA AND VOMITING   . Adhesive [Tape] Itching, Swelling, Rash and Other (See Comments)      Also pain at location  . Codeine Nausea And Vomiting  . Bactrim [Sulfamethoxazole-Trimethoprim]     Muscle aches  . Cephalexin Hives  . Lisinopril Swelling  . Macrobid [Nitrofurantoin Monohyd Macro] Nausea And Vomiting  . Sulfur Hives and Nausea Only  . Tamiflu [Oseltamivir Phosphate]    Social History   Socioeconomic History  . Marital status: Married    Spouse name: Not on  file  . Number of children: Not on file  . Years of education: Not on file  . Highest education level: Not on file  Occupational History  . Not on file  Tobacco Use  . Smoking status: Never Smoker  . Smokeless tobacco: Never Used  Substance and Sexual Activity  . Alcohol use: Not Currently  . Drug use: Never  . Sexual activity: Not Currently  Other Topics Concern  . Not on file  Social History Narrative   ** Merged History Encounter **       Married, 2 children. Retired from  Strain:   . Difficulty of Paying Living Expenses:   Food Insecurity:   . Worried About Charity fundraiser in the Last Year:   . Arboriculturist in the Last Year:   Transportation Needs:   . Film/video editor (Medical):   Marland Kitchen Lack of Transportation (Non-Medical):   Physical Activity:   . Days of Exercise per Week:   . Minutes of Exercise per Session:   Stress:   . Feeling of Stress :   Social Connections:   . Frequency of Communication with Friends and Family:   . Frequency of Social Gatherings with Friends and Family:   . Attends Religious Services:   . Active Member of Clubs or Organizations:   . Attends Archivist Meetings:   Marland Kitchen Marital Status:   Intimate Partner Violence:   . Fear of Current or Ex-Partner:   . Emotionally Abused:   Marland Kitchen Physically Abused:   . Sexually Abused:      Review of Systems  All other systems reviewed and are negative.      Objective:   Physical Exam Vitals reviewed.  HENT:     Head:    Cardiovascular:     Rate and Rhythm: Normal rate and regular rhythm.  Pulmonary:     Effort: Pulmonary effort is normal.     Breath sounds: Normal breath sounds.  Musculoskeletal:       Arms:       Legs:  Neurological:     Mental Status: She is alert.           Assessment & Plan:  Closed fracture of pubic ramus, unspecified laterality, sequela - Plan: CBC with Differential/Platelet, COMPLETE METABOLIC PANEL WITH GFR Patient is weightbearing as tolerated.  Recommended stool softeners to prevent constipation due to narcotic use.  Thankfully the patient is requiring only Tylenol at the present time to help manage her pain.  Has an appointment next week to meet with the orthopedist.  Has physical therapy scheduled to come to her home and work with her.  No evidence of complications such as pneumonia, pulmonary embolism, DVT, or urinary tract infection at the present time.

## 2020-04-29 ENCOUNTER — Telehealth: Payer: Self-pay | Admitting: Cardiovascular Disease

## 2020-04-29 ENCOUNTER — Telehealth: Payer: Self-pay

## 2020-04-29 NOTE — Telephone Encounter (Signed)
Called and spoke with pt who handed the phone to her husband to speak. Husband states that last Friday the pt went to her PCP to have labs and that they received a call today stating that her sodium was low. They suggested she stop her fluid pill for one week and come back to their office next Tuesday to have labs drawn. Discussed our pharmacist Kristin's review of the previous note with the husband, notified that per [facility], the antibiotic Trimethoprim she is on can cause low sodium and for her to contact whoever prescribed this to see if they could choose a different abx. Notified that per [facility] this is more likely what is causing her low sodium but for her to hold both the Trimethoprim and her HCTZ for a few days. husband states that her labs will be drawn in one week and asked if it was okay to hold until then. Notified that this should be fine, and that they should still contact whoever prescribed the abx to see if they will change it. Husband verbalized understanding and had no other questions at this time.

## 2020-04-29 NOTE — Telephone Encounter (Signed)
Lindsey Erickson from [facility] called with verbal order for Lindsey Erickson. Physical Therapy (1 week -1), (1 week -2), (1 week-4).

## 2020-04-29 NOTE — Telephone Encounter (Signed)
Okay to give verbal orders?  ?

## 2020-04-29 NOTE — Telephone Encounter (Signed)
Trimethoprim can cause low sodium.  Have her contact whomever prescribed that and see if they can choose a different antibiotic.  More likely this than the hctz, but she should stop both for a few days for her levels to come back up.

## 2020-04-29 NOTE — Telephone Encounter (Signed)
New Message   Pt is calling and says she got a call from her PCP and says they told her that they have lab work that shows her sodium was 130, low. They advised her to stop taking her fluid pill for 1 week. And then have more labs done  She says Dr [name] told her to take the fluid pill and is wondering what she should do    Please call

## 2020-04-30 ENCOUNTER — Telehealth: Payer: Self-pay | Admitting: Family Medicine

## 2020-04-30 ENCOUNTER — Emergency Department (HOSPITAL_COMMUNITY): Payer: Medicare Other

## 2020-04-30 ENCOUNTER — Emergency Department (HOSPITAL_COMMUNITY)
Admission: EM | Admit: 2020-04-30 | Discharge: 2020-04-30 | Disposition: A | Discharge status: Home / Self Care | Payer: Medicare Other | Attending: Emergency Medicine | Admitting: Emergency Medicine

## 2020-04-30 ENCOUNTER — Other Ambulatory Visit: Payer: Self-pay

## 2020-04-30 DIAGNOSIS — I10 Essential (primary) hypertension: Secondary | ICD-10-CM | POA: Diagnosis not present

## 2020-04-30 DIAGNOSIS — I2510 Atherosclerotic heart disease of native coronary artery without angina pectoris: Secondary | ICD-10-CM | POA: Diagnosis not present

## 2020-04-30 DIAGNOSIS — E871 Hypo-osmolality and hyponatremia: Secondary | ICD-10-CM

## 2020-04-30 DIAGNOSIS — I252 Old myocardial infarction: Secondary | ICD-10-CM | POA: Diagnosis not present

## 2020-04-30 DIAGNOSIS — Z79899 Other long term (current) drug therapy: Secondary | ICD-10-CM | POA: Insufficient documentation

## 2020-04-30 DIAGNOSIS — E039 Hypothyroidism, unspecified: Secondary | ICD-10-CM | POA: Insufficient documentation

## 2020-04-30 DIAGNOSIS — Z7982 Long term (current) use of aspirin: Secondary | ICD-10-CM | POA: Diagnosis not present

## 2020-04-30 DIAGNOSIS — N390 Urinary tract infection, site not specified: Secondary | ICD-10-CM

## 2020-04-30 DIAGNOSIS — Z85828 Personal history of other malignant neoplasm of skin: Secondary | ICD-10-CM | POA: Insufficient documentation

## 2020-04-30 DIAGNOSIS — Z7902 Long term (current) use of antithrombotics/antiplatelets: Secondary | ICD-10-CM | POA: Insufficient documentation

## 2020-04-30 DIAGNOSIS — R410 Disorientation, unspecified: Secondary | ICD-10-CM | POA: Diagnosis present

## 2020-04-30 DIAGNOSIS — Z96652 Presence of left artificial knee joint: Secondary | ICD-10-CM | POA: Insufficient documentation

## 2020-04-30 LAB — CBC
HCT: 38.4 % (ref 36.0–46.0)
Hemoglobin: 12.9 g/dL (ref 12.0–15.0)
MCH: 33.4 pg (ref 26.0–34.0)
MCHC: 33.6 g/dL (ref 30.0–36.0)
MCV: 99.5 fL (ref 80.0–100.0)
Platelets: 369 10*3/uL (ref 150–400)
RBC: 3.86 MIL/uL — ABNORMAL LOW (ref 3.87–5.11)
RDW: 12.7 % (ref 11.5–15.5)
WBC: 7.9 10*3/uL (ref 4.0–10.5)
nRBC: 0 % (ref 0.0–0.2)

## 2020-04-30 LAB — BASIC METABOLIC PANEL
Anion gap: 10 (ref 5–15)
BUN: 20 mg/dL (ref 8–23)
CO2: 22 mmol/L (ref 22–32)
Calcium: 9.3 mg/dL (ref 8.9–10.3)
Chloride: 98 mmol/L (ref 98–111)
Creatinine, Ser: 0.81 mg/dL (ref 0.44–1.00)
GFR calc Af Amer: >60 mL/min (ref >60)
GFR calc non Af Amer: >60 mL/min (ref >60)
Glucose, Bld: 155 mg/dL — ABNORMAL HIGH (ref 70–99)
Potassium: 4.1 mmol/L (ref 3.5–5.1)
Sodium: 130 mmol/L — ABNORMAL LOW (ref 135–145)

## 2020-04-30 LAB — URINALYSIS, ROUTINE W REFLEX MICROSCOPIC
Bilirubin Urine: NEGATIVE
Glucose, UA: NEGATIVE mg/dL
Hgb urine dipstick: NEGATIVE
Ketones, ur: 5 mg/dL — AB
Nitrite: NEGATIVE
Protein, ur: NEGATIVE mg/dL
Specific Gravity, Urine: 1.02 (ref 1.005–1.030)
pH: 5 (ref 5.0–8.0)

## 2020-04-30 MED ORDER — FOSFOMYCIN TROMETHAMINE 3 G PO PACK
3.0000 g | PACK | Freq: Once | ORAL | Status: AC
  Administered 2020-04-30: 3 g via ORAL
  Filled 2020-04-30 (×2): qty 3

## 2020-04-30 MED ORDER — SODIUM CHLORIDE 0.9 % IV BOLUS
500.0000 mL | Freq: Once | INTRAVENOUS | Status: AC
  Administered 2020-04-30: 500 mL via INTRAVENOUS

## 2020-04-30 NOTE — ED Provider Notes (Signed)
[facility] EMERGENCY DEPARTMENT Provider Note   CSN: [id number] Arrival date & time: 04/30/20  1257     History No chief complaint on file.   Lindsey Erickson is a 86 y.o. female.  The history is provided by the patient, medical records and the spouse. No language interpreter was used.     86 year old female with history of recurrent urinary tract infection, anemia, CAD, hypertension, presenting to the ED per PCP recommendation for confusion.  History obtained through patient who is able to give an adequate history.  Patient report on 5/25, she had a fall when she tries to swat away a bee while she was standing on her front porch.  She suffered a facial contusion, and had a hairline fracture to her left hip/pelvic.  Several days later she was also evaluated at [facility] urology for urinary frequency and for recurrent UTI, 5 UTI within this past year.  She was placed on a low-dose trimethoprim which she has been taking for nearly 2 weeks.  According to family members, patient has had intermittent episodes of confusion in which she sometimes forgets where she is or what she was doing.  This message was relayed to her doctor and she recently had blood work performed 5 days ago when her sodium was detected to be low at 130.  Dr. Reino Bellis discontinue her antibiotic and her diuretic as it may contribute to her hyponatremia.  She discontinue her antibiotic today and earlier in the day her husband report patient once again was acting confused.  PCP was contacted and recommended patient to come to the ER for evaluation.  Currently patient denies any significant urinary discomfort aside from increased pain or frequency which is not new.  She still endorse intermittent pain to her left hip/pelvic from her recent fall.  She denies any significant headache, neck pain, chest pain, trouble breathing, abdominal pain, nausea vomiting diarrhea.  She is up-to-date with Covid vaccination.  She denies  any focal numbness or focal weakness.  Past Medical History:  Diagnosis Date  . Anemia   . Angio-edema   . Arthritis   . Blood transfusion without reported diagnosis 1957  . CAD (coronary artery disease)    a. NSTEMI 2004 with thrombus in Cx and mod RCA. b. Nonischemic nuc 2008. c. mild NSTEMI 01/2013 (trop 0.42) -> cath with unchanged mod LAD & RCA disease, for continued medical therapy.  . Cancer ([place])   . Cataract   . H/O Hashimoto thyroiditis   . Hyperlipidemia   . HYPERTENSION    a. Also has h/o intermittentl low BP.  Marland Kitchen Hypothyroidism   . Kidney stones   . Myocardial infarction ([place])   . Osteoporosis    no fractures  . Prediabetes   . QT prolongation    a. Prior EKGs QTc 474, but on 4/1 was 524 for unclear reason.  . Recurrent urinary tract infection   . Squamous cell carcinoma of skin 04/17/2015  . Urticaria     Patient Active Problem List   Diagnosis Date Noted  . Atypical chest pain 05/03/2018  . Angio-edema   . Squamous cell carcinoma of skin 04/17/2015  . Cystitis 08/03/2014  . NSTEMI (non-ST elevated myocardial infarction) ([place]) 02/21/2014  . QT prolongation 02/21/2014  . CAD (coronary artery disease)   . Hyperlipidemia   . Multinodular goiter 10/03/2013  . Osteoarthritis of right knee   . Osteoporosis with fracture hx 02/15/2011  . Hypothyroid 02/15/2011  . Kidney stones   .  Essential hypertension 06/22/2010  . Other diseases of lung, not elsewhere classified 02/25/2010    Past Surgical History:  Procedure Laterality Date  . ADENOIDECTOMY    . APPENDECTOMY  1999  . Bilateral Arthroscopies of knees    . CARDIAC CATHETERIZATION     Moderate 3 vessel CAD by cath 05/2012  . CHOLECYSTECTOMY  02/2010  . JOINT REPLACEMENT  01/2010   L TKR - allusio  . LEFT HEART CATHETERIZATION WITH CORONARY ANGIOGRAM N/A 05/22/2012   Procedure: LEFT HEART CATHETERIZATION WITH CORONARY ANGIOGRAM;  Surgeon: Sinclair Grooms, MD;  Location: [facility] CATH LAB;  Service:  Cardiovascular;  Laterality: N/A;  . LEFT HEART CATHETERIZATION WITH CORONARY ANGIOGRAM N/A 02/20/2014   Procedure: LEFT HEART CATHETERIZATION WITH CORONARY ANGIOGRAM;  Surgeon: Peter M Martinique, MD;  Location: [facility] CATH LAB;  Service: Cardiovascular;  Laterality: N/A;  . TONSILLECTOMY    . TUBAL LIGATION       OB History   No obstetric history on file.     Family History  Problem Relation Age of Onset  . Allergies Father   . Cancer Father   . Coronary artery disease Mother 83  . Heart disease Mother   . CAD Sister 35  . Cancer Sister   . AAA (abdominal aortic aneurysm) Sister   . Hypertension Sister   . Hyperlipidemia Sister   . Diabetes Sister   . Heart disease Sister   . Diabetes Maternal Aunt   . Allergic rhinitis Neg Hx   . Asthma Neg Hx   . Eczema Neg Hx   . Urticaria Neg Hx     Social History   Tobacco Use  . Smoking status: Never Smoker  . Smokeless tobacco: Never Used  Substance Use Topics  . Alcohol use: Not Currently  . Drug use: Never    Home Medications Prior to Admission medications   Medication Sig Start Date End Date Taking? Authorizing Provider  acetaminophen (TYLENOL) 500 MG tablet Take 500 mg by mouth every 6 (six) hours as needed for moderate pain.    [provider]  amoxicillin-clavulanate (AUGMENTIN) 875-125 MG tablet Take 1 tablet by mouth 2 (two) times daily. 02/01/20   Susy Frizzle, MD  Ascorbic Acid (VITAMIN C) 1000 MG tablet Take 1,000 mg by mouth daily.    [provider]  aspirin 81 MG tablet Take 81 mg by mouth daily.      [provider]  carvedilol (COREG CR) 40 MG 24 hr capsule TAKE 1 CAPSULE EVERY EVENING 11/12/19   Susy Frizzle, MD  carvedilol (COREG CR) 40 MG 24 hr capsule Take 40 mg by mouth daily. 02/10/20   [provider]  clopidogrel (PLAVIX) 75 MG tablet TAKE 1 TABLET DAILY 03/21/20   Susy Frizzle, MD  clopidogrel (PLAVIX) 75 MG tablet Take 75 mg by mouth daily. 03/21/20   [provider]  conjugated estrogens (PREMARIN) vaginal cream Place 1 Applicatorful vaginally 2 (two) times a week. Tuesday and Saturday.    [provider]  famotidine (PEPCID) 20 MG tablet Take 1 tablet (20 mg total) by mouth 2 (two) times daily. Patient not taking: Reported on 04/25/2020 12/10/19   Kennith Gain, MD  fexofenadine [facility]) 180 MG tablet TAKE 1 TABLET TWICE A DAY Patient not taking: Reported on 04/25/2020 03/12/20   Kennith Gain, MD  fexofenadine (ALLEGRA) 180 MG tablet Take 180 mg by mouth as needed (lip swelling).  03/12/20   [provider]  hydrochlorothiazide (MICROZIDE) 12.5 MG capsule TAKE 1 CAPSULE DAILY 04/18/20   Lorretta Harp, MD  hydrochlorothiazide (MICROZIDE) 12.5 MG capsule Take 12.5 mg by mouth daily. 01/15/20   [provider]  isosorbide mononitrate (IMDUR) 60 MG 24 hr tablet TAKE 1 TABLET DAILY 10/15/19   Susy Frizzle, MD  isosorbide mononitrate (IMDUR) 60 MG 24 hr tablet Take 60 mg by mouth daily. 04/12/20   [provider]  mometasone (ELOCON) 0.1 % cream Apply 1 application topically daily. Patient not taking: Reported on 04/25/2020 03/22/19   Susy Frizzle, MD  Multiple Vitamins-Minerals (CENTRUM SILVER 50+WOMEN PO) Take 1 tablet by mouth daily.    [provider]  Multiple Vitamins-Minerals (CENTRUM SILVER ADULT 50+ PO) Take 1 tablet by mouth daily.    [provider]  nitroGLYCERIN (NITROSTAT) 0.4 MG SL tablet Place 1 tablet (0.4 mg total) under the tongue every 5 (five) minutes as needed for chest pain. Patient not taking: Reported on 04/25/2020 09/13/19   Susy Frizzle, MD  OVER THE COUNTER MEDICATION Take 1 capsule by mouth daily. Cranberry D - [place]    [provider]  PEPCID 20 MG tablet Take 20 mg by mouth as needed (angioedema).  12/10/19   [provider]  predniSONE (DELTASONE) 20 MG tablet Take 20 mg by mouth as needed (angioedema).  12/19/19    [provider]  rosuvastatin (CRESTOR) 20 MG tablet TAKE 1 TABLET AT BEDTIME 11/08/19   Susy Frizzle, MD  rosuvastatin (CRESTOR) 20 MG tablet Take 20 mg by mouth at bedtime. 02/06/20   [provider]  saccharomyces boulardii (FLORASTOR) 250 MG capsule Take 250 mg by mouth daily.    [provider]  SYNTHROID 50 MCG tablet TAKE 1 TABLET ALTERNATING WITH ONE AND ONE-HALF TABLETS AS DIRECTED 04/07/20   Elayne Snare, MD  SYNTHROID 50 MCG tablet Take 50 mcg by mouth See admin instructions. 22mcg on M, W, F. And 36mcg on T, Thurs, Sat, Sun 04/07/20   [provider]  trimethoprim (TRIMPEX) 100 MG tablet Take 100 mg by mouth daily. 04/07/20   [provider]    Allergies    Ciprofloxacin, Other, Adhesive [tape], Codeine, Bactrim [sulfamethoxazole-trimethoprim], Cephalexin, Lisinopril, Macrobid [nitrofurantoin monohyd macro], Sulfur, and Tamiflu [oseltamivir phosphate]  Review of Systems   Review of Systems  All other systems reviewed and are negative.   Physical Exam Updated Vital Signs BP 137/67 (BP Location: Left Arm)   Pulse 81   Temp 97.8 F (36.6 C) (Oral)   Resp 16   SpO2 98%   Physical Exam Vitals and nursing note reviewed.  Constitutional:      General: She is not in acute distress.    Appearance: She is well-developed.  HENT:     Head: Normocephalic.     Comments: Old ecchymosis noted to right orbital region Eyes:     Extraocular Movements: Extraocular movements intact.     Conjunctiva/sclera: Conjunctivae normal.     Pupils: Pupils are equal, round, and reactive to light.  Cardiovascular:     Rate and Rhythm: Rhythm irregular.     Pulses: Normal pulses.     Heart sounds: Normal heart sounds.  Pulmonary:     Effort: Pulmonary effort is normal.     Breath sounds: Normal breath sounds.  Abdominal:     Palpations: Abdomen is soft.     Tenderness: There is no abdominal tenderness.  Musculoskeletal:     Cervical back: Normal  range of  motion and neck supple. No rigidity.  Skin:    Findings: No rash.  Neurological:     Mental Status: She is alert and oriented to person, place, and time.     GCS: GCS eye subscore is 4. GCS verbal subscore is 5. GCS motor subscore is 6.     Cranial Nerves: Cranial nerves are intact.     Sensory: Sensation is intact.     Motor: Motor function is intact.     ED Results / Procedures / Treatments   Labs (all labs ordered are listed, but only abnormal results are displayed) Labs Reviewed  CBC - Abnormal; Notable for the following components:      Result Value   RBC 3.86 (*)    All other components within normal limits  BASIC METABOLIC PANEL - Abnormal; Notable for the following components:   Sodium 130 (*)    Glucose, Bld 155 (*)    All other components within normal limits  URINALYSIS, ROUTINE W REFLEX MICROSCOPIC - Abnormal; Notable for the following components:   APPearance HAZY (*)    Ketones, ur 5 (*)    Leukocytes,Ua MODERATE (*)    Bacteria, UA RARE (*)    All other components within normal limits  URINE CULTURE    EKG None  Radiology CT Head Wo Contrast  Result Date: 04/30/2020 CLINICAL DATA:  [place] 2 weeks ago, hit right-side of face EXAM: CT HEAD WITHOUT CONTRAST TECHNIQUE: Contiguous axial images were obtained from the base of the skull through the vertex without intravenous contrast. COMPARISON:  04/15/2020 FINDINGS: Brain: No acute infarct or hemorrhage. Lateral ventricles and midline structures are stable. No acute extra-axial fluid collections. No mass effect. Vascular: No hyperdense vessel or unexpected calcification. Skull: Normal. Negative for fracture or focal lesion. Sinuses/Orbits: No acute finding. Other: The right sided scalp hematoma seen previously has resolved in the interim. IMPRESSION: 1. No acute intracranial process. Electronically Signed   By: Randa Ngo M.D.   On: 04/30/2020 16:19    Procedures Procedures (including critical care  time)  Medications Ordered in ED Medications  fosfomycin (MONUROL) packet 3 g (has no administration in time range)  sodium chloride 0.9 % bolus 500 mL (0 mLs Intravenous Stopped 04/30/20 1846)    ED Course  I have reviewed the triage vital signs and the nursing notes.  Pertinent labs & imaging results that were available during my care of the patient were reviewed by me and considered in my medical decision making (see chart for details).    MDM Rules/Calculators/A&P                      BP 123/69 (BP Location: Left Arm)   Pulse 80   Temp 97.8 F (36.6 C) (Oral)   Resp 17   SpO2 97%   Final Clinical Impression(s) / ED Diagnoses Final diagnoses:  Hyponatremia  Recurrent UTI    Rx / DC Orders ED Discharge Orders    None     6:09 PM Patient has had intermittent confusion since her fall over 2 weeks ago.  Some of her confusion may be due to concussion but some of confusion also may be attributed to a low sodium of 130.  Hyponatremia may be secondary to the antibiotic and diuretic pill.  At this time she is alert and oriented x4, able to recall the history and a clear and concise manner.  UA obtained today show demonstrate signs of infection include moderate leukocyte esterase,  21-50 WBC and rare bacteria.  Underlying UTI may contribute to confusion.  Sodium is unchanged at 130.  Her head CT scan is unremarkable.  We will give 1 dose of fosfomycin here.  500cc of IV fluid given.  Urine culture sent.  Care discussed with Dr. Vallery Ridge.  Anticipate discharging patient and she will follow up with PCP for lab recheck in the next few days.  Patient voiced understanding and agrees with plan.  Husband at bedside also in agreement.  Recommend patient to decrease her oral intake as patient has been drinking copious amount of water thinking that it will flush away her UTI.  Doubt SIADH, or diabetes insipidus.  She will need to return if she develops shortness of breath or leg swelling because  she is now without her diuretic.  Patient should follow-up with her PCP in the next several days for labs recheck.   Domenic Moras, PA-C 04/30/20 Doran Heater    Charlesetta Shanks, MD 04/30/20 [phone]

## 2020-04-30 NOTE — Telephone Encounter (Signed)
Verbal order was sent in

## 2020-04-30 NOTE — Discharge Instructions (Signed)
Your sodium is 130.  Please cut your fluid intake in half as drinking too much water can lower your sodium.  Follow-up with your doctor in the next several days for labs rechecked.  Return to the ER if you develop shortness of breath or leg swelling.

## 2020-04-30 NOTE — ED Triage Notes (Signed)
Pt here sent by PCP for eval of worsening confusion since fall on 5/25, seen for same here. Pt A/O x 4 but family reports confusion is intermittent, like forgetting where she is or what she is doing occasionally. Sodium level checked, was 130 on 6/4, is supposed to have labs rechecked next week.

## 2020-04-30 NOTE — Telephone Encounter (Signed)
Patient daughter called in stating that patient has been more confused lately during both day and the night. She states that confusion has come on since patient's fall on 04/15/2020. She states that patient has been having some issues with medications as well. Daughter took her medications last night to ensure that patient did not take them incorrectly as she has been asked to discontinue HCTZ and Trimethoprim as they can cause low sodium and patient was found to be have low sodium on Friday. I advised patient's daughter to take pt to ER for increase in confusion. Patient's daughter verbalized understanding.

## 2020-05-03 LAB — URINE CULTURE: Culture: 20000 — AB

## 2020-05-04 ENCOUNTER — Telehealth: Payer: Self-pay | Admitting: Emergency Medicine

## 2020-05-04 NOTE — Telephone Encounter (Signed)
Post ED Visit - Positive Culture Follow-up  Culture report reviewed by antimicrobial stewardship pharmacist: [place] Team []  Nathan Batchelder, Pharm.D. []  [address], Pharm.D., BCPS AQ-ID []  Heide Guile, Pharm.D., BCPS []  Parks Neptune, Pharm.D., BCPS []  [place], Pharm.D., BCPS, AAHIVP []  [place], Pharm.D., BCPS, AAHIVP [x]  Legrand Como, PharmD, BCPS []  Salome Arnt, PharmD, BCPS []  Johnnette Gourd, PharmD, BCPS []  Hughes Better, PharmD []  Leeroy Cha, PharmD, BCPS []  Laqueta Linden, PharmD  [place] Team []  [address], PharmD []  Leodis Sias, PharmD []  Lindell Spar, PharmD []  Royetta Asal, Rph []  Graylin Shiver) Rema Fendt, PharmD []  Glennon Mac, PharmD []  Arlyn Dunning, PharmD []  Netta Cedars, PharmD []  Dia Sitter, PharmD []  Leone Haven, PharmD []  Gretta Arab, PharmD []  Theodis Shove, PharmD []  Peggyann Juba, PharmD   Positive urine culture Treated with Fosfomycin, organism sensitive to the same and no further patient follow-up is required at this time.  Reuel Boom Sherrol Vicars 05/04/2020, 11:44 AM

## 2020-05-05 ENCOUNTER — Telehealth: Payer: Self-pay | Admitting: Family Medicine

## 2020-05-05 NOTE — Telephone Encounter (Signed)
Ok to refer.

## 2020-05-05 NOTE — Telephone Encounter (Signed)
Ok with referral. 

## 2020-05-05 NOTE — Telephone Encounter (Signed)
CB# [phone] Jeannie from [facility] need a order for Lindsey Erickson to have a skill nurse for medication management

## 2020-05-06 NOTE — Telephone Encounter (Signed)
Spoke with Lindsey Erickson about ok on referral

## 2020-05-07 ENCOUNTER — Other Ambulatory Visit: Payer: Medicare Other

## 2020-05-07 ENCOUNTER — Telehealth: Payer: Self-pay | Admitting: Family Medicine

## 2020-05-07 ENCOUNTER — Other Ambulatory Visit: Payer: Self-pay

## 2020-05-07 DIAGNOSIS — E871 Hypo-osmolality and hyponatremia: Secondary | ICD-10-CM

## 2020-05-07 LAB — BASIC METABOLIC PANEL
BUN: 17 mg/dL (ref 7–25)
CO2: 27 mmol/L (ref 20–32)
Calcium: 9.2 mg/dL (ref 8.6–10.4)
Chloride: 100 mmol/L (ref 98–110)
Creat: 0.64 mg/dL (ref 0.60–0.88)
Glucose, Bld: 106 mg/dL — ABNORMAL HIGH (ref 65–99)
Potassium: 4.6 mmol/L (ref 3.5–5.3)
Sodium: 136 mmol/L (ref 135–146)

## 2020-05-07 NOTE — Telephone Encounter (Signed)
Patient came in for follow up blood draw and asked if you could send her to neurology for a consultation for her ongoing bout of confusion and forgetfulness. Please advise?

## 2020-05-08 ENCOUNTER — Telehealth: Payer: Self-pay | Admitting: Family Medicine

## 2020-05-08 NOTE — Telephone Encounter (Signed)
Patient daughter called asking if you recommend patient go back on her HCTZ and her Trimethoprim since her sodium levels are now within range. Please advise?

## 2020-05-08 NOTE — Telephone Encounter (Signed)
I'd like to see her for MMSE

## 2020-05-08 NOTE — Telephone Encounter (Signed)
That would be fine 

## 2020-05-09 NOTE — Telephone Encounter (Signed)
Spoke with patient's husband and informed him of recommendations by Dr. Dennard Schaumann. Patient's husband verbalized understanding.

## 2020-05-09 NOTE — Telephone Encounter (Signed)
Spoke with patient and informed her per Dr. Dennard Schaumann that she may resume her fluid pill and antibiotic. Patient verbalized understanding

## 2020-05-15 ENCOUNTER — Other Ambulatory Visit: Payer: Self-pay

## 2020-05-15 ENCOUNTER — Ambulatory Visit (INDEPENDENT_AMBULATORY_CARE_PROVIDER_SITE_OTHER): Payer: Medicare Other | Admitting: Family Medicine

## 2020-05-15 VITALS — BP 130/70 | HR 89 | Temp 96.0°F | Wt 153.0 lb

## 2020-05-15 DIAGNOSIS — R413 Other amnesia: Secondary | ICD-10-CM | POA: Diagnosis not present

## 2020-05-15 DIAGNOSIS — S069X0D Unspecified intracranial injury without loss of consciousness, subsequent encounter: Secondary | ICD-10-CM

## 2020-05-15 DIAGNOSIS — F0781 Postconcussional syndrome: Secondary | ICD-10-CM | POA: Diagnosis not present

## 2020-05-15 NOTE — Progress Notes (Signed)
Subjective:    Patient ID: Lindsey Erickson, female    DOB: [birth year], 86 y.o.   MRN: [id number] 04/25/20 Patient slipped and fell on May 25 and landed on concrete striking her right temple, her right shoulder and suffering a fracture to her superior left pubic ramus.  She went to the hospital.  Patient had CT scan of the neck, chest, and brain that showed no traumatic injury.  She had an x-ray of the hips and pelvis that did show a minimally displaced left superior pubic ramus fracture otherwise normal.  Patient was discharged home with pain medication and then weightbearing as tolerated.  She is able to ambulate with a walker.  She denies any cough or chest pain or pleurisy or shortness of breath.  She denies any leg swelling or erythema in her legs or pain in her legs.  There is no evidence of DVT on exam.  She denies any symptoms of urinary tract infection.  Home health physical therapy is going to be coming out to her home next week to start to work with her.  At that time, my plan was: Patient is weightbearing as tolerated.  Recommended stool softeners to prevent constipation due to narcotic use.  Thankfully the patient is requiring only Tylenol at the present time to help manage her pain.  Has an appointment next week to meet with the orthopedist.  Has physical therapy scheduled to come to her home and work with her.  No evidence of complications such as pneumonia, pulmonary embolism, DVT, or urinary tract infection at the present time.  05/15/20 Patient presents today with her husband reporting memory loss.  Symptoms began after her fall and her traumatic brain injury.  She struck her right temple against concrete.  She did not experience loss of consciousness however she does report that she was stunned for several minutes after the impact.  She denies any headache however she gives several examples of memory loss and confusion that she has had ever since.  First she is getting lost riding in a car.   She became confused yesterday driving home from the store and did not recognize their normal route home from the store.  Second she is forgetting conversations that she has had with her husband.  She provides the example of her TV.  Their TV was malfunctioning.  They called the satellite repair man.  She stated that she was going to stay in her bedroom while he was at the house.  When he arrived she went back to her bedroom.  Her husband went to check on her later and she asked him why she was in her bedroom.  He told her because the repair man was here.  She had no recollection of the repairman coming or why he was even there.  She is also demonstrating some long-term memory issues.  Patient recently reports having a conversation with her daughter where she forgot events that occurred in the long distant past.  This has her very confused and concerned.  I performed a Mini-Mental status exam today.  Patient is able to easily tell me the date and location.  She can easily remember 3 objects immediately.  However 5 minutes later she is only able to remember 1 out of 3 objects.  She inverts the letter O in the R when she spells world in reverse.  She is unable to perform serial sevens.  Clock drawing, the patient has poor spacing of the numbers and runs  out of room.  She could not remember the time that I told her to draw and was unable to draw the appropriate time despite me telling it to her 3 different times.  Past Medical History:  Diagnosis Date  . Anemia   . Angio-edema   . Arthritis   . Blood transfusion without reported diagnosis 1957  . CAD (coronary artery disease)    a. NSTEMI 2004 with thrombus in Cx and mod RCA. b. Nonischemic nuc 2008. c. mild NSTEMI 01/2013 (trop 0.42) -> cath with unchanged mod LAD & RCA disease, for continued medical therapy.  . Cancer ([place])   . Cataract   . H/O Hashimoto thyroiditis   . Hyperlipidemia   . HYPERTENSION    a. Also has h/o intermittentl low BP.  Marland Kitchen  Hypothyroidism   . Kidney stones   . Myocardial infarction ([place])   . Osteoporosis    no fractures  . Prediabetes   . QT prolongation    a. Prior EKGs QTc 474, but on 4/1 was 524 for unclear reason.  . Recurrent urinary tract infection   . Squamous cell carcinoma of skin 04/17/2015  . Urticaria    Past Surgical History:  Procedure Laterality Date  . ADENOIDECTOMY    . APPENDECTOMY  1999  . Bilateral Arthroscopies of knees    . CARDIAC CATHETERIZATION     Moderate 3 vessel CAD by cath 05/2012  . CHOLECYSTECTOMY  02/2010  . JOINT REPLACEMENT  01/2010   L TKR - allusio  . LEFT HEART CATHETERIZATION WITH CORONARY ANGIOGRAM N/A 05/22/2012   Procedure: LEFT HEART CATHETERIZATION WITH CORONARY ANGIOGRAM;  Surgeon: Sinclair Grooms, MD;  Location: [facility] CATH LAB;  Service: Cardiovascular;  Laterality: N/A;  . LEFT HEART CATHETERIZATION WITH CORONARY ANGIOGRAM N/A 02/20/2014   Procedure: LEFT HEART CATHETERIZATION WITH CORONARY ANGIOGRAM;  Surgeon: Peter M Martinique, MD;  Location: [facility] CATH LAB;  Service: Cardiovascular;  Laterality: N/A;  . TONSILLECTOMY    . TUBAL LIGATION     Current Outpatient Medications on File Prior to Visit  Medication Sig Dispense Refill  . acetaminophen (TYLENOL) 500 MG tablet Take 500 mg by mouth every 6 (six) hours as needed for moderate pain.    . Ascorbic Acid (VITAMIN C) 1000 MG tablet Take 1,000 mg by mouth daily.    Marland Kitchen aspirin 81 MG tablet Take 81 mg by mouth daily.      . carvedilol (COREG CR) 40 MG 24 hr capsule TAKE 1 CAPSULE EVERY EVENING 90 capsule 3  . carvedilol (COREG CR) 40 MG 24 hr capsule Take 40 mg by mouth daily.    . clopidogrel (PLAVIX) 75 MG tablet TAKE 1 TABLET DAILY 90 tablet 3  . clopidogrel (PLAVIX) 75 MG tablet Take 75 mg by mouth daily.    Marland Kitchen conjugated estrogens (PREMARIN) vaginal cream Place 1 Applicatorful vaginally 2 (two) times a week. Tuesday and Saturday.    . famotidine (PEPCID) 20 MG tablet Take 1 tablet (20 mg total) by mouth 2 (two)  times daily. 180 tablet 1  . fexofenadine (ALLEGRA) 180 MG tablet TAKE 1 TABLET TWICE A DAY 180 tablet 3  . fexofenadine (ALLEGRA) 180 MG tablet Take 180 mg by mouth as needed (lip swelling).     . hydrochlorothiazide (MICROZIDE) 12.5 MG capsule TAKE 1 CAPSULE DAILY 90 capsule 3  . hydrochlorothiazide (MICROZIDE) 12.5 MG capsule Take 12.5 mg by mouth daily.    . isosorbide mononitrate (IMDUR) 60 MG 24 hr tablet TAKE 1  TABLET DAILY 90 tablet 3  . isosorbide mononitrate (IMDUR) 60 MG 24 hr tablet Take 60 mg by mouth daily.    . mometasone (ELOCON) 0.1 % cream Apply 1 application topically daily. 45 g 0  . Multiple Vitamins-Minerals (CENTRUM SILVER 50+WOMEN PO) Take 1 tablet by mouth daily.    . Multiple Vitamins-Minerals (CENTRUM SILVER ADULT 50+ PO) Take 1 tablet by mouth daily.    . nitroGLYCERIN (NITROSTAT) 0.4 MG SL tablet Place 1 tablet (0.4 mg total) under the tongue every 5 (five) minutes as needed for chest pain. 90 tablet 3  . OVER THE COUNTER MEDICATION Take 1 capsule by mouth daily. Cranberry D - Mannose    . PEPCID 20 MG tablet Take 20 mg by mouth as needed (angioedema).     . predniSONE (DELTASONE) 20 MG tablet Take 20 mg by mouth as needed (angioedema).     . rosuvastatin (CRESTOR) 20 MG tablet TAKE 1 TABLET AT BEDTIME 90 tablet 3  . rosuvastatin (CRESTOR) 20 MG tablet Take 20 mg by mouth at bedtime.    . saccharomyces boulardii (FLORASTOR) 250 MG capsule Take 250 mg by mouth daily.    Marland Kitchen SYNTHROID 50 MCG tablet TAKE 1 TABLET ALTERNATING WITH ONE AND ONE-HALF TABLETS AS DIRECTED 108 tablet 3  . SYNTHROID 50 MCG tablet Take 50 mcg by mouth See admin instructions. 19mcg on M, W, F. And 45mcg on T, Thurs, Sat, Sun    . trimethoprim (TRIMPEX) 100 MG tablet Take 100 mg by mouth daily.    Marland Kitchen amoxicillin-clavulanate (AUGMENTIN) 875-125 MG tablet Take 1 tablet by mouth 2 (two) times daily. (Patient not taking: Reported on 05/15/2020) 20 tablet 0  . fluconazole (DIFLUCAN) 100 MG tablet Take 100  mg by mouth daily.     No current facility-administered medications on file prior to visit.   Allergies  Allergen Reactions  . Ciprofloxacin Hives  . 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   ALL NARCOTICS- NAUSEA AND VOMITING   . Adhesive [Tape] Itching, Swelling, Rash and Other (See Comments)    Also pain at location  . Codeine Nausea And Vomiting  . Bactrim [Sulfamethoxazole-Trimethoprim]     Muscle aches  . Cephalexin Hives  . Lisinopril Swelling  . Macrobid [Nitrofurantoin Monohyd Macro] Nausea And Vomiting  . Sulfur Hives and Nausea Only  . Tamiflu [Oseltamivir Phosphate]    Social History   Socioeconomic History  . Marital status: Married    Spouse name: Not on file  . Number of children: Not on file  . Years of education: Not on file  . Highest education level: Not on file  Occupational History  . Not on file  Tobacco Use  . Smoking status: Never Smoker  . Smokeless tobacco: Never Used  Vaping Use  . Vaping Use: Never used  Substance and Sexual Activity  . Alcohol use: Not Currently  . Drug use: Never  . Sexual activity: Not Currently  Other Topics Concern  . Not on file  Social History Narrative   ** Merged History Encounter **       Married, 2 children. Retired from [employer] Strain:   . Difficulty of Paying Living Expenses:   Food Insecurity:   . Worried About Charity fundraiser in the Last Year:   . [place] in the  Last Year:   Transportation Needs:   . Film/video editor (Medical):   Marland Kitchen Lack of Transportation (Non-Medical):   Physical Activity:   . Days of Exercise per Week:   . Minutes of Exercise per Session:   Stress:   . Feeling of Stress :   Social Connections:   . Frequency of Communication with Friends and Family:   . Frequency of Social Gatherings with Friends and Family:   .  Attends Religious Services:   . Active Member of Clubs or Organizations:   . Attends Archivist Meetings:   Marland Kitchen Marital Status:   Intimate Partner Violence:   . Fear of Current or Ex-Partner:   . Emotionally Abused:   Marland Kitchen Physically Abused:   . Sexually Abused:      Review of Systems  All other systems reviewed and are negative.      Objective:   Physical Exam Vitals reviewed.  HENT:     Head:   Cardiovascular:     Rate and Rhythm: Normal rate and regular rhythm.  Pulmonary:     Effort: Pulmonary effort is normal.     Breath sounds: Normal breath sounds.  Musculoskeletal:       Arms:       Legs:  Neurological:     Mental Status: She is alert.           Assessment & Plan:  Memory loss - Plan: BASIC METABOLIC PANEL WITH GFR, Vitamin B12, TSH  Post-concussion syndrome  Closed traumatic brain injury, without loss of consciousness, subsequent encounter  The sudden onset of symptoms after her traumatic brain injury and concussion leads me to believe that this is a sequela of this rather than dementia.  Her Mini-Mental status exam is 23 out of 30 which is markedly abnormal compared to her baseline.  I explained to the patient that if her memory loss is due her recent concussion, time alone will allow it to improve.  She would like a second opinion with neurology as she is very concerned.  I will be glad to arrange this for her.  She recently did have hyponatremia in the emergency room so would like to recheck that to ensure that is not playing a role.  While checking lab work I will check a B12 and a TSH.  She has had 2 - head CTs within the last month.

## 2020-05-16 LAB — BASIC METABOLIC PANEL WITH GFR
BUN: 19 mg/dL (ref 7–25)
CO2: 27 mmol/L (ref 20–32)
Calcium: 9.9 mg/dL (ref 8.6–10.4)
Chloride: 97 mmol/L — ABNORMAL LOW (ref 98–110)
Creat: 0.84 mg/dL (ref 0.60–0.88)
GFR, Est African American: 73 mL/min/{1.73_m2} (ref >=60)
GFR, Est Non African American: 63 mL/min/{1.73_m2} (ref >=60)
Glucose, Bld: 161 mg/dL — ABNORMAL HIGH (ref 65–99)
Potassium: 4.6 mmol/L (ref 3.5–5.3)
Sodium: 133 mmol/L — ABNORMAL LOW (ref 135–146)

## 2020-05-16 LAB — TSH: TSH: 1.19 mIU/L (ref 0.40–4.50)

## 2020-05-16 LAB — VITAMIN B12: Vitamin B-12: 185 pg/mL — ABNORMAL LOW (ref 200–1100)

## 2020-05-19 ENCOUNTER — Other Ambulatory Visit: Payer: Self-pay

## 2020-05-19 ENCOUNTER — Ambulatory Visit (INDEPENDENT_AMBULATORY_CARE_PROVIDER_SITE_OTHER): Payer: Medicare Other | Admitting: *Deleted

## 2020-05-19 ENCOUNTER — Encounter: Payer: Self-pay | Admitting: Neurology

## 2020-05-19 DIAGNOSIS — D619 Aplastic anemia, unspecified: Secondary | ICD-10-CM

## 2020-05-19 MED ORDER — CYANOCOBALAMIN 1000 MCG/ML IJ SOLN
1000.0000 ug | Freq: Once | INTRAMUSCULAR | Status: AC
  Administered 2020-05-19: 1000 ug via INTRAMUSCULAR

## 2020-05-27 ENCOUNTER — Encounter: Payer: Self-pay | Admitting: Cardiovascular Disease

## 2020-05-27 ENCOUNTER — Other Ambulatory Visit: Payer: Self-pay

## 2020-05-27 ENCOUNTER — Ambulatory Visit (INDEPENDENT_AMBULATORY_CARE_PROVIDER_SITE_OTHER): Payer: Medicare Other

## 2020-05-27 ENCOUNTER — Ambulatory Visit (INDEPENDENT_AMBULATORY_CARE_PROVIDER_SITE_OTHER): Payer: Medicare Other | Admitting: Cardiovascular Disease

## 2020-05-27 DIAGNOSIS — E782 Mixed hyperlipidemia: Secondary | ICD-10-CM | POA: Diagnosis not present

## 2020-05-27 DIAGNOSIS — I10 Essential (primary) hypertension: Secondary | ICD-10-CM | POA: Diagnosis not present

## 2020-05-27 DIAGNOSIS — I2510 Atherosclerotic heart disease of native coronary artery without angina pectoris: Secondary | ICD-10-CM | POA: Diagnosis not present

## 2020-05-27 DIAGNOSIS — E538 Deficiency of other specified B group vitamins: Secondary | ICD-10-CM | POA: Diagnosis not present

## 2020-05-27 MED ORDER — CYANOCOBALAMIN 1000 MCG/ML IJ SOLN
1000.0000 ug | Freq: Once | INTRAMUSCULAR | Status: AC
  Administered 2020-05-27: 1000 ug via INTRAMUSCULAR

## 2020-05-27 NOTE — Progress Notes (Signed)
05/27/2020 Lindsey Erickson   01-Mar-1932  [id number]  Primary Physician Pickard, Cammie Mcgee, MD Primary Cardiologist: Lorretta Harp MD [name], [place]  HPI:  Lindsey Erickson is a 86 y.o.  mildly overweight married Caucasian female mother of 2 children , grandmother of 4 grandchildren who was self-referred to be established in my practice. She was previously a cardiology patient of Dr. Pernell Dupre. Her new primary care physician is Dr. Margaretmary Eddy at [facility] family practice. I last saw her in the office 06/29/2019.Marland Kitchen Cardiovascular risk factor profile is notable for treated hypertension and hyperlipidemia. She does have a family history his sister who's had stents. Her cardiac history dates back to 2004 when she had a non-STEMI related to thrombus in the circumflex with moderate RCA disease. She had a negative nuclear stress test in 2008 and underwent cardiac catheterization one year ago on 02/20/14 by Dr. Irish Lack after being admitted with chest pain and evolving positive enzymes. She gets infrequent nitroglycerin responsive chest pain. Her anatomy at the time of her last cath was notable for a 70% mid RCA lesion, 50-60% mid-circumflex lesion. The RCA underwent FFR interrogation which measured 0.9 suggesting that it was not physiologically significant. Since I saw her a year ago she's remained clinically stable without chest pain or shortness of breath. She was seen in the emergency room angioedema related to lisinopril which was discontinued. Blood pressure at home have remained under good control.  Since I saw her in the office 1 year ago she did well until recently when she fell off 3 steps trying to avoid a bee and hit her head.  She did have some local trauma.  Head CT showed no intracranial bleed.  Since that time she is noticed some changes in her memory.  She gets occasional chest pain when she is tired but this is not changed in frequency or severity.    Current Meds    Medication Sig  . acetaminophen (TYLENOL) 500 MG tablet Take 500 mg by mouth every 6 (six) hours as needed for moderate pain.  . Ascorbic Acid (VITAMIN C) 1000 MG tablet Take 1,000 mg by mouth daily.  . carvedilol (COREG CR) 40 MG 24 hr capsule TAKE 1 CAPSULE EVERY EVENING  . carvedilol (COREG CR) 40 MG 24 hr capsule Take 40 mg by mouth daily.  . clopidogrel (PLAVIX) 75 MG tablet TAKE 1 TABLET DAILY  . clopidogrel (PLAVIX) 75 MG tablet Take 75 mg by mouth daily.  Marland Kitchen conjugated estrogens (PREMARIN) vaginal cream Place 1 Applicatorful vaginally 2 (two) times a week. Tuesday and Saturday.  . Cyanocobalamin 2000 MCG/ML SOLN Inject as directed.  . famotidine (PEPCID) 20 MG tablet Take 1 tablet (20 mg total) by mouth 2 (two) times daily.  . fexofenadine (ALLEGRA) 180 MG tablet TAKE 1 TABLET TWICE A DAY  . fexofenadine (ALLEGRA) 180 MG tablet Take 180 mg by mouth as needed (lip swelling).   . fluconazole (DIFLUCAN) 100 MG tablet Take 100 mg by mouth daily.  . hydrochlorothiazide (MICROZIDE) 12.5 MG capsule TAKE 1 CAPSULE DAILY  . hydrochlorothiazide (MICROZIDE) 12.5 MG capsule Take 12.5 mg by mouth daily.  . isosorbide mononitrate (IMDUR) 60 MG 24 hr tablet TAKE 1 TABLET DAILY  . isosorbide mononitrate (IMDUR) 60 MG 24 hr tablet Take 60 mg by mouth daily.  . mometasone (ELOCON) 0.1 % cream Apply 1 application topically daily.  . Multiple Vitamins-Minerals (CENTRUM SILVER 50+WOMEN PO) Take 1 tablet by mouth daily.  Marland Kitchen  Multiple Vitamins-Minerals (CENTRUM SILVER ADULT 50+ PO) Take 1 tablet by mouth daily.  . nitroGLYCERIN (NITROSTAT) 0.4 MG SL tablet Place 1 tablet (0.4 mg total) under the tongue every 5 (five) minutes as needed for chest pain.  Marland Kitchen OVER THE COUNTER MEDICATION Take 1 capsule by mouth daily. Cranberry D - [place]  . PEPCID 20 MG tablet Take 20 mg by mouth as needed (angioedema).   . predniSONE (DELTASONE) 20 MG tablet Take 20 mg by mouth as needed (angioedema).   . rosuvastatin (CRESTOR)  20 MG tablet TAKE 1 TABLET AT BEDTIME  . rosuvastatin (CRESTOR) 20 MG tablet Take 20 mg by mouth at bedtime.  . saccharomyces boulardii (FLORASTOR) 250 MG capsule Take 250 mg by mouth daily.  Marland Kitchen SYNTHROID 50 MCG tablet TAKE 1 TABLET ALTERNATING WITH ONE AND ONE-HALF TABLETS AS DIRECTED  . SYNTHROID 50 MCG tablet Take 50 mcg by mouth See admin instructions. 24mcg on M, W, F. And 2mcg on T, Thurs, Sat, Sun  . trimethoprim (TRIMPEX) 100 MG tablet Take 100 mg by mouth daily.  . [DISCONTINUED] aspirin 81 MG tablet Take 81 mg by mouth daily.       Allergies  Allergen Reactions  . Ciprofloxacin Hives  . 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   ALL NARCOTICS- NAUSEA AND VOMITING   . Adhesive [Tape] Itching, Swelling, Rash and Other (See Comments)    Also pain at location  . Codeine Nausea And Vomiting  . Bactrim [Sulfamethoxazole-Trimethoprim]     Muscle aches  . Cephalexin Hives  . Lisinopril Swelling  . Macrobid [Nitrofurantoin Monohyd Macro] Nausea And Vomiting  . Sulfur Hives and Nausea Only  . Tamiflu [Oseltamivir Phosphate]     Social History   Socioeconomic History  . Marital status: Married    Spouse name: Not on file  . Number of children: Not on file  . Years of education: Not on file  . Highest education level: Not on file  Occupational History  . Not on file  Tobacco Use  . Smoking status: Never Smoker  . Smokeless tobacco: Never Used  Vaping Use  . Vaping Use: Never used  Substance and Sexual Activity  . Alcohol use: Not Currently  . Drug use: Never  . Sexual activity: Not Currently  Other Topics Concern  . Not on file  Social History Narrative   ** Merged History Encounter **       Married, 2 children. Retired from [employer] Strain:   . Difficulty of Paying Living Expenses:   Food Insecurity:   . Worried  About Charity fundraiser in the Last Year:   . Arboriculturist in the Last Year:   Transportation Needs:   . Film/video editor (Medical):   Marland Kitchen Lack of Transportation (Non-Medical):   Physical Activity:   . Days of Exercise per Week:   . Minutes of Exercise per Session:   Stress:   . Feeling of Stress :   Social Connections:   . Frequency of Communication with Friends and Family:   . Frequency of Social Gatherings with Friends and Family:   . Attends Religious Services:   . Active Member of Clubs or Organizations:   . Attends Archivist Meetings:   Marland Kitchen Marital Status:   Intimate Partner Violence:   . Fear of  Current or Ex-Partner:   . Emotionally Abused:   Marland Kitchen Physically Abused:   . Sexually Abused:      Review of Systems: General: negative for chills, fever, night sweats or weight changes.  Cardiovascular: negative for chest pain, dyspnea on exertion, edema, orthopnea, palpitations, paroxysmal nocturnal dyspnea or shortness of breath Dermatological: negative for rash Respiratory: negative for cough or wheezing Urologic: negative for hematuria Abdominal: negative for nausea, vomiting, diarrhea, bright red blood per rectum, melena, or hematemesis Neurologic: negative for visual changes, syncope, or dizziness All other systems reviewed and are otherwise negative except as noted above.    Blood pressure 126/76, pulse 78, height 5\' 6"  (1.676 m), weight 152 lb 6.4 oz (69.1 kg), SpO2 98 %.  General appearance: alert and no distress Neck: no adenopathy, no carotid bruit, no JVD, supple, symmetrical, trachea midline and thyroid not enlarged, symmetric, no tenderness/mass/nodules Lungs: clear to auscultation bilaterally Heart: regular rate and rhythm, S1, S2 normal, no murmur, click, rub or gallop Extremities: extremities normal, atraumatic, no cyanosis or edema Pulses: 2+ and symmetric Skin: Skin color, texture, turgor normal. No rashes or lesions Neurologic: Alert and  oriented X 3, normal strength and tone. Normal symmetric reflexes. Normal coordination and gait  EKG sinus rhythm at 78 with septal Q waves and occasional PVCs.  I personally reviewed this EKG.  ASSESSMENT AND PLAN:   Essential hypertension History of essential hypertension a blood pressure measured today 126/76.  She is on carvedilol, and hydrochlorothiazide.  CAD (coronary artery disease) History of CAD with non-STEMI back in 2004 with thrombus in the circumflex and moderate RCA disease.  Her last cath performed by Dr. Michele Mcalpine was notable for 70% mid RCA stenosis, 70 to 60% mid circumflex stenosis.  The RCA underwent FFR interrogation which was 0.9 suggesting this was not physiologically significant.  She does get occasional chest pain when she is "tired" and is on chronic long-acting nitrates.  Because she just fell and hit her head and was on dual antiplatelet therapy I am inclined to stop her aspirin.  Hyperlipidemia History of hyperlipidemia on statin therapy with lipid profile performed 06/25/2019 revealing total cholesterol 148, LDL 70 and HDL 55.      Lorretta Harp MD [facility], [facility] 05/27/2020 11:20 AM

## 2020-05-27 NOTE — Patient Instructions (Signed)
Medication Instructions:  STOP ASPIRIN   *If you need a refill on your cardiac medications before your next appointment, please call your pharmacy*  Lab Work: NONE   Testing/Procedures: NONE  Follow-Up: At [facility], you and your health needs are our priority.  As part of our continuing mission to provide you with exceptional heart care, we have created designated Provider Care Teams.  These Care Teams include your primary Cardiologist (physician) and Advanced Practice Providers (APPs -  Physician Assistants and Nurse Practitioners) who all work together to provide you with the care you need, when you need it.  We recommend signing up for the patient portal called "MyChart".  Sign up information is provided on this After Visit Summary.  MyChart is used to connect with patients for Virtual Visits (Telemedicine).  Patients are able to view lab/test results, encounter notes, upcoming appointments, etc.  Non-urgent messages can be sent to your provider as well.   To learn more about what you can do with MyChart, go to [email or web address].    Your next appointment:   12 month(s)  The format for your next appointment:   In Person  Provider:   You may see Quay Burow, MD or one of the following Advanced Practice Providers on your designated Care Team:    Kerin Ransom, PA-C  [place], [place]  Coletta Memos, [place]

## 2020-05-27 NOTE — Progress Notes (Signed)
Pt came in for b12 injection. Administered in L deltoid. Pt tolerated well. 

## 2020-05-27 NOTE — Assessment & Plan Note (Signed)
History of essential hypertension a blood pressure measured today 126/76.  She is on carvedilol, and hydrochlorothiazide.

## 2020-05-27 NOTE — Assessment & Plan Note (Addendum)
History of CAD with non-STEMI back in 2004 with thrombus in the circumflex and moderate RCA disease.  Her last cath performed by Dr. Michele Mcalpine was notable for 70% mid RCA stenosis, 27 to 60% mid circumflex stenosis.  The RCA underwent FFR interrogation which was 0.9 suggesting this was not physiologically significant.  She does get occasional chest pain when she is "tired" and is on chronic long-acting nitrates.  Because she just fell and hit her head and was on dual antiplatelet therapy I am inclined to stop her aspirin.

## 2020-05-27 NOTE — Assessment & Plan Note (Signed)
History of hyperlipidemia on statin therapy with lipid profile performed 06/25/2019 revealing total cholesterol 148, LDL 70 and HDL 55.

## 2020-06-02 ENCOUNTER — Other Ambulatory Visit: Payer: Self-pay

## 2020-06-02 ENCOUNTER — Ambulatory Visit (INDEPENDENT_AMBULATORY_CARE_PROVIDER_SITE_OTHER): Payer: Medicare Other | Admitting: *Deleted

## 2020-06-02 DIAGNOSIS — E538 Deficiency of other specified B group vitamins: Secondary | ICD-10-CM

## 2020-06-02 MED ORDER — CYANOCOBALAMIN 1000 MCG/ML IJ SOLN
1000.0000 ug | Freq: Once | INTRAMUSCULAR | Status: AC
  Administered 2020-06-02: 1000 ug via INTRAMUSCULAR

## 2020-06-09 ENCOUNTER — Other Ambulatory Visit: Payer: Self-pay

## 2020-06-09 ENCOUNTER — Ambulatory Visit (INDEPENDENT_AMBULATORY_CARE_PROVIDER_SITE_OTHER): Payer: Medicare Other | Admitting: *Deleted

## 2020-06-09 DIAGNOSIS — E538 Deficiency of other specified B group vitamins: Secondary | ICD-10-CM

## 2020-06-09 MED ORDER — CYANOCOBALAMIN 1000 MCG/ML IJ SOLN
1000.0000 ug | Freq: Once | INTRAMUSCULAR | Status: AC
  Administered 2020-06-09: 1000 ug via INTRAMUSCULAR

## 2020-06-09 NOTE — Progress Notes (Signed)
Patient seen in office for Vitamin B 12 injection.   Tolerated IM administration well.   Advised next injection due in 4 weeks.

## 2020-06-26 ENCOUNTER — Encounter: Payer: Self-pay | Admitting: Allergy

## 2020-06-26 ENCOUNTER — Ambulatory Visit (INDEPENDENT_AMBULATORY_CARE_PROVIDER_SITE_OTHER): Payer: Medicare Other | Admitting: Allergy

## 2020-06-26 ENCOUNTER — Other Ambulatory Visit: Payer: Self-pay

## 2020-06-26 VITALS — BP 122/72 | HR 64 | Temp 98.1°F | Resp 16 | Ht 66.0 in | Wt 152.0 lb

## 2020-06-26 DIAGNOSIS — T783XXD Angioneurotic edema, subsequent encounter: Secondary | ICD-10-CM

## 2020-06-26 DIAGNOSIS — I2510 Atherosclerotic heart disease of native coronary artery without angina pectoris: Secondary | ICD-10-CM

## 2020-06-26 MED ORDER — EPINEPHRINE 0.3 MG/0.3ML IJ SOAJ: 0.3000 mg | Freq: Once | INTRAMUSCULAR | 1 refills | Status: AC

## 2020-06-26 NOTE — Patient Instructions (Addendum)
Idiopathic Angioedema (lip swelling)     - episodic angioedema can be due to variety of different triggers.  ACE-I (lisinopril) is a known medication that can cause angioedema of the oral cavity.   The swelling can continue for months even despite stopping the medication.  However you have been off lisinopril for a long-time at this time.  This category of medications should not be taken again in the future.      - Labs for swelling were largely unremarkable except for sensitivity to elm tree pollen and Paprika.     - avoid foods that contain Paprika in ingredients list.   If it doesn't like Paprika I would like for you to try the food   - swelling episodes are becoming less severe and less frequent   - continue current regimen with allegra and pepcid for flare-ups   - reserve prednisone for more severe swelling events   - have access to epipen device in case of allergic reaction and follow emergency action plan you have in front of your folder   Follow-up in Oct/Nov 2021

## 2020-06-26 NOTE — Progress Notes (Signed)
Follow-up Note  RE: Lindsey Erickson MRN: [id number] DOB: June 27, 1932 Date of Office Visit: 06/26/2020   History of present illness: Lindsey Erickson is a 86 y.o. female presenting today for follow-up of idiopathic angioedema.  She presents today with her daughter.  She was last seen in the office on 02/06/2020 by myself.  She unfortunately had a fall on May 25.  She was outside on her porch cleaning with a broom when a piece warmed above her and she tried to swipe it away with the broom but she fell off of the porch that was 4 steps high. She sustained a pelvic fracture and that has required physical therapy as well as a head injury.  She had a unremarkable head CT.  However 2 weeks later she was having some mild confusion.  This seemed to worsen progressively.  She recently saw a neurologist that diagnosed her with progressive vascular dementia.  Since the fall she has been very concerned about what she eats and the possibility of having a reaction to it.  She does not want to eat any foods that list spices in the ingredients list.  Her daughter states she is also not wanting to eat foods that even list "natural flavors" in the ingredients list.  She is pretty much eating fruits and cheetos at this time.  She has lost a significant amount of weight about 20 pounds.  She is on vitamin B12 shots.  Her other provider is a very concerned about her weight loss and her nutrition.  She states her last lip swelling episode was on April 15.  She reported eating apple and peanut butter, mandrin orange, peach yogurt, cheetos, raisins and dried cranberries.  She states the dry cranberries was the only new thing she had eaten that day.  She had swelling of her lip and then she did feel like she was having some throat closure.  This causes anxiety.  She took Met[employer].  She tries not to take the prednisone if she can avoid it.  This has been her only episode since her last visit.  Her epinephrine device is  expired.  Review of systems in the past 4 weeks: Review of Systems  Constitutional: Positive for weight loss.  HENT: Negative.   Eyes: Negative.   Respiratory: Negative.   Cardiovascular: Negative.   Gastrointestinal: Negative.   Musculoskeletal: Negative.   Skin: Negative.   Neurological:       See HPI    All other systems negative unless noted above in HPI  Past medical/social/surgical/family history have been reviewed and are unchanged unless specifically indicated below.  see HPI  Medication List: Current Outpatient Medications  Medication Sig Dispense Refill  . acetaminophen (TYLENOL) 500 MG tablet Take 500 mg by mouth every 6 (six) hours as needed for moderate pain.    . carvedilol (COREG CR) 40 MG 24 hr capsule TAKE 1 CAPSULE EVERY EVENING 90 capsule 3  . clopidogrel (PLAVIX) 75 MG tablet TAKE 1 TABLET DAILY 90 tablet 3  . conjugated estrogens (PREMARIN) vaginal cream Place 1 Applicatorful vaginally 2 (two) times a week. Tuesday and Saturday.    . Cyanocobalamin 2000 MCG/ML SOLN Inject as directed.    . famotidine (PEPCID) 20 MG tablet Take 1 tablet (20 mg total) by mouth 2 (two) times daily. 180 tablet 1  . fexofenadine (ALLEGRA) 180 MG tablet TAKE 1 TABLET TWICE A DAY 180 tablet 3  . hydrochlorothiazide (MICROZIDE) 12.5 MG capsule TAKE 1  CAPSULE DAILY 90 capsule 3  . isosorbide mononitrate (IMDUR) 60 MG 24 hr tablet TAKE 1 TABLET DAILY 90 tablet 3  . Multiple Vitamins-Minerals (CENTRUM SILVER 50+WOMEN PO) Take 1 tablet by mouth daily.    . nitroGLYCERIN (NITROSTAT) 0.4 MG SL tablet Place 1 tablet (0.4 mg total) under the tongue every 5 (five) minutes as needed for chest pain. 90 tablet 3  . OVER THE COUNTER MEDICATION Take 1 capsule by mouth daily. Cranberry D - Mannose    . PEPCID 20 MG tablet Take 20 mg by mouth as needed (angioedema).     . predniSONE (DELTASONE) 20 MG tablet Take 20 mg by mouth as needed (angioedema).     . rosuvastatin (CRESTOR) 20 MG tablet TAKE  1 TABLET AT BEDTIME 90 tablet 3  . saccharomyces boulardii (FLORASTOR) 250 MG capsule Take 250 mg by mouth daily.    Marland Kitchen SYNTHROID 50 MCG tablet TAKE 1 TABLET ALTERNATING WITH ONE AND ONE-HALF TABLETS AS DIRECTED 108 tablet 3  . trimethoprim (TRIMPEX) 100 MG tablet Take 100 mg by mouth daily.    Marland Kitchen amoxicillin-clavulanate (AUGMENTIN) 875-125 MG tablet Take 1 tablet by mouth 2 (two) times daily. (Patient not taking: Reported on 05/15/2020) 20 tablet 0  . Ascorbic Acid (VITAMIN C) 1000 MG tablet Take 1,000 mg by mouth daily. (Patient not taking: Reported on 06/26/2020)    . fluconazole (DIFLUCAN) 100 MG tablet Take 100 mg by mouth daily. (Patient not taking: Reported on 06/26/2020)    . mometasone (ELOCON) 0.1 % cream Apply 1 application topically daily. 45 g 0   No current facility-administered medications for this visit.     Known medication allergies: Allergies  Allergen Reactions  . Ciprofloxacin Hives  . 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   ALL NARCOTICS- NAUSEA AND VOMITING   . Adhesive [Tape] Itching, Swelling, Rash and Other (See Comments)    Also pain at location  . Codeine Nausea And Vomiting  . Bactrim [Sulfamethoxazole-Trimethoprim]     Muscle aches  . Cephalexin Hives  . Lisinopril Swelling  . Macrobid [Nitrofurantoin Monohyd Macro] Nausea And Vomiting  . Sulfur Hives and Nausea Only  . Tamiflu [Oseltamivir Phosphate]      Physical examination: Blood pressure 122/72, pulse 64, temperature 98.1 F (36.7 C), resp. rate 16, height 5\' 6"  (1.676 m), weight 152 lb (68.9 kg), SpO2 97 %.  General: Alert, interactive, in no acute distress. HEENT: TMs pearly gray, turbinates non-edematous without discharge, post-pharynx non erythematous. Neck: Supple without lymphadenopathy. Lungs: Clear to auscultation without wheezing, rhonchi or rales. {no increased work of breathing. CV: Normal S1, S2  without murmurs. Abdomen: Nondistended, nontender. Skin: Warm and dry, without lesions or rashes. Extremities:  No clubbing, cyanosis or edema. Neuro:   Grossly intact.  Diagnositics/Labs: Labs:  Component     Latest Ref Rng & Units 02/06/2020  Class Interpretation      0  Allergen Cinnamon IgE     Class 0 kU/L <0.10  F282-IgE Nutmeg     Class 0 kU/L <9.89  Allergen Garlic IgE     Class 0 kU/L <0.10  Paprika IgE     Class 0/I kU/L 0.14 (A)  F045-IgE Yeast     Class 0 kU/L <0.10    Assessment and plan:   Idiopathic Angioedema (lip swelling)     - episodic angioedema can be due to variety of different triggers.  ACE-I (lisinopril) is a known medication that can cause angioedema of the oral cavity.   The swelling can continue for months even despite stopping the medication.  However you have been off lisinopril for a long-time at this time.  This category of medications should not be taken again in the future.      - Labs for swelling were largely unremarkable except for sensitivity to elm tree pollen and Paprika.     -She is also avoiding tomato based products like pasta sauce and pizza   - avoid foods that contain Paprika in ingredients list.   If it doesn't like Paprika I would like for you to try the food   - swelling episodes are becoming less severe and less frequent   - continue current regimen with allegra and pepcid for flare-ups   - reserve prednisone for more severe swelling events   - have access to epipen device in case of allergic reaction and follow emergency action plan you have in front of your folder   Follow-up in Oct/Nov 2021  Total of 30 minutes, greater than 50% of which was spent in discussion of treatment and management options.   I appreciate the opportunity to take part in [place] care. Please do not hesitate to contact me with questions.  Sincerely,   Prudy Feeler, MD Allergy/Immunology [facility]

## 2020-06-30 ENCOUNTER — Encounter: Payer: Self-pay | Admitting: Family Medicine

## 2020-07-04 ENCOUNTER — Other Ambulatory Visit: Payer: Medicare Other

## 2020-07-04 ENCOUNTER — Other Ambulatory Visit: Payer: Self-pay

## 2020-07-04 DIAGNOSIS — I2510 Atherosclerotic heart disease of native coronary artery without angina pectoris: Secondary | ICD-10-CM

## 2020-07-04 DIAGNOSIS — E538 Deficiency of other specified B group vitamins: Secondary | ICD-10-CM

## 2020-07-04 LAB — BASIC METABOLIC PANEL
BUN/Creatinine Ratio: 46 (calc) — ABNORMAL HIGH (ref 6–22)
BUN: 39 mg/dL — ABNORMAL HIGH (ref 7–25)
CO2: 29 mmol/L (ref 20–32)
Calcium: 9.5 mg/dL (ref 8.6–10.4)
Chloride: 101 mmol/L (ref 98–110)
Creat: 0.84 mg/dL (ref 0.60–0.88)
Glucose, Bld: 127 mg/dL — ABNORMAL HIGH (ref 65–99)
Potassium: 4.4 mmol/L (ref 3.5–5.3)
Sodium: 138 mmol/L (ref 135–146)

## 2020-07-08 ENCOUNTER — Other Ambulatory Visit: Payer: Self-pay

## 2020-07-08 ENCOUNTER — Ambulatory Visit (INDEPENDENT_AMBULATORY_CARE_PROVIDER_SITE_OTHER): Payer: Medicare Other | Admitting: Family Medicine

## 2020-07-08 VITALS — BP 120/70 | HR 82 | Temp 98.1°F | Ht 66.0 in | Wt 150.0 lb

## 2020-07-08 DIAGNOSIS — Z0000 Encounter for general adult medical examination without abnormal findings: Secondary | ICD-10-CM

## 2020-07-08 DIAGNOSIS — E7800 Pure hypercholesterolemia, unspecified: Secondary | ICD-10-CM | POA: Diagnosis not present

## 2020-07-08 DIAGNOSIS — I2510 Atherosclerotic heart disease of native coronary artery without angina pectoris: Secondary | ICD-10-CM

## 2020-07-08 DIAGNOSIS — Z0001 Encounter for general adult medical examination with abnormal findings: Secondary | ICD-10-CM

## 2020-07-08 DIAGNOSIS — E042 Nontoxic multinodular goiter: Secondary | ICD-10-CM

## 2020-07-08 DIAGNOSIS — R7303 Prediabetes: Secondary | ICD-10-CM

## 2020-07-08 NOTE — Progress Notes (Signed)
Subjective:    Patient ID: Lindsey Erickson, female    DOB: 1932/11/08, 86 y.o.   MRN: [id number]  HPI Patient is a very pleasant 86 year old white female who is here for CPE. She has a significant past medical history of coronary artery disease. According to her report she is suffered 2 separate myocardial infarctions. However on repeat catheterizations, the patient states that she has lesions which are not amenable to stenting. Therefore she is being managed medically. She is currently on a long-acting nitroglycerin, beta blocker, and ACE inhibitor, statin medication, and dual antiplatelet therapy. She follows up regularly with her cardiologist.  She is currently on Synthroid to help suppress the growth of her goiter.  Patient has elected not to pursue mammograms further.  Her last bone density test was 2018 and was excellent.  Therefore we have elected to defer this for 5 years until 2023.  Due to her age she does not require a Pap smear or colonoscopy.  Her immunizations are listed below.  She is due for Shingrix as well as a flu shot this fall.  Otherwise her immunizations are up-to-date.  She is seeing neurology due to persistent memory loss following a concussion. Immunization History  Administered Date(s) Administered   Fluad Quad(high Dose 65+) 08/02/2019   Influenza Whole 07/23/2009   Influenza,inj,Quad PF,6+ Mos 09/22/2015, 08/17/2016, 08/24/2017, 08/16/2018   Influenza-Unspecified 08/22/2013, 09/02/2014   PFIZER SARS-COV-2 Vaccination 12/17/2019, 01/07/2020   Pneumococcal Conjugate-13 01/14/2014   Pneumococcal Polysaccharide-23 02/21/2012   Tdap 04/15/2020   Zoster 09/23/2006    Past Medical History:  Diagnosis Date   Anemia    Angio-edema    Arthritis    Blood transfusion without reported diagnosis 1957   CAD (coronary artery disease)    a. NSTEMI 2004 with thrombus in Cx and mod RCA. b. Nonischemic nuc 2008. c. mild NSTEMI 01/2013 (trop 0.42) -> cath with  unchanged mod LAD & RCA disease, for continued medical therapy.   Cancer ([place])    Cataract    H/O Hashimoto thyroiditis    Hyperlipidemia    HYPERTENSION    a. Also has h/o intermittentl low BP.   Hypothyroidism    Kidney stones    Myocardial infarction [facility])    Osteoporosis    no fractures   Prediabetes    QT prolongation    a. Prior EKGs QTc 474, but on 4/1 was 524 for unclear reason.   Recurrent urinary tract infection    Squamous cell carcinoma of skin 04/17/2015   Urticaria    Past Surgical History:  Procedure Laterality Date   ADENOIDECTOMY     APPENDECTOMY  1999   Bilateral Arthroscopies of knees     CARDIAC CATHETERIZATION     Moderate 3 vessel CAD by cath 05/2012   CHOLECYSTECTOMY  02/2010   JOINT REPLACEMENT  01/2010   L TKR - allusio   LEFT HEART CATHETERIZATION WITH CORONARY ANGIOGRAM N/A 05/22/2012   Procedure: LEFT HEART CATHETERIZATION WITH CORONARY ANGIOGRAM;  Surgeon: Sinclair Grooms, MD;  Location: [facility] CATH LAB;  Service: Cardiovascular;  Laterality: N/A;   LEFT HEART CATHETERIZATION WITH CORONARY ANGIOGRAM N/A 02/20/2014   Procedure: LEFT HEART CATHETERIZATION WITH CORONARY ANGIOGRAM;  Surgeon: Peter M Martinique, MD;  Location[facility] CATH LAB;  Service: Cardiovascular;  Laterality: N/A;   TONSILLECTOMY     TUBAL LIGATION     Current Outpatient Medications on File Prior to Visit  Medication Sig Dispense Refill   acetaminophen (TYLENOL) 500 MG tablet Take  500 mg by mouth every 6 (six) hours as needed for moderate pain.     amoxicillin-clavulanate (AUGMENTIN) 875-125 MG tablet Take 1 tablet by mouth 2 (two) times daily. (Patient not taking: Reported on 05/15/2020) 20 tablet 0   Ascorbic Acid (VITAMIN C) 1000 MG tablet Take 1,000 mg by mouth daily. (Patient not taking: Reported on 06/26/2020)     carvedilol (COREG CR) 40 MG 24 hr capsule TAKE 1 CAPSULE EVERY EVENING 90 capsule 3   clopidogrel (PLAVIX) 75 MG tablet TAKE 1 TABLET DAILY 90 tablet 3    conjugated estrogens (PREMARIN) vaginal cream Place 1 Applicatorful vaginally 2 (two) times a week. Tuesday and Saturday.     Cyanocobalamin 2000 MCG/ML SOLN Inject as directed.     famotidine (PEPCID) 20 MG tablet Take 1 tablet (20 mg total) by mouth 2 (two) times daily. 180 tablet 1   fexofenadine (ALLEGRA) 180 MG tablet TAKE 1 TABLET TWICE A DAY 180 tablet 3   fluconazole (DIFLUCAN) 100 MG tablet Take 100 mg by mouth daily. (Patient not taking: Reported on 06/26/2020)     hydrochlorothiazide (MICROZIDE) 12.5 MG capsule TAKE 1 CAPSULE DAILY 90 capsule 3   isosorbide mononitrate (IMDUR) 60 MG 24 hr tablet TAKE 1 TABLET DAILY 90 tablet 3   mometasone (ELOCON) 0.1 % cream Apply 1 application topically daily. 45 g 0   Multiple Vitamins-Minerals (CENTRUM SILVER 50+WOMEN PO) Take 1 tablet by mouth daily.     nitroGLYCERIN (NITROSTAT) 0.4 MG SL tablet Place 1 tablet (0.4 mg total) under the tongue every 5 (five) minutes as needed for chest pain. 90 tablet 3   OVER THE COUNTER MEDICATION Take 1 capsule by mouth daily. Cranberry D - Mannose     PEPCID 20 MG tablet Take 20 mg by mouth as needed (angioedema).      predniSONE (DELTASONE) 20 MG tablet Take 20 mg by mouth as needed (angioedema).      rosuvastatin (CRESTOR) 20 MG tablet TAKE 1 TABLET AT BEDTIME 90 tablet 3   saccharomyces boulardii (FLORASTOR) 250 MG capsule Take 250 mg by mouth daily.     SYNTHROID 50 MCG tablet TAKE 1 TABLET ALTERNATING WITH ONE AND ONE-HALF TABLETS AS DIRECTED 108 tablet 3   trimethoprim (TRIMPEX) 100 MG tablet Take 100 mg by mouth daily.     No current facility-administered medications on file prior to visit.   Allergies  Allergen Reactions   Ciprofloxacin Hives   Other Other (See Comments)    Frozen Blood Plasma-caused patient to crash / cardiac arrest / CAN NEVER TAKE THIS AGAIN-PER MD'S.   ALL NARCOTICS- NAUSEA AND VOMITING    Adhesive [Tape] Itching, Swelling, Rash and Other (See Comments)     Also pain at location   Codeine Nausea And Vomiting   Bactrim [Sulfamethoxazole-Trimethoprim]     Muscle aches   Cephalexin Hives   Lisinopril Swelling   Macrobid [Nitrofurantoin Monohyd Macro] Nausea And Vomiting   Sulfur Hives and Nausea Only   Tamiflu [Oseltamivir Phosphate]    Social History   Socioeconomic History   Marital status: Married    Spouse name: Not on file   Number of children: Not on file   Years of education: Not on file   Highest education level: Not on file  Occupational History   Not on file  Tobacco Use   Smoking status: Never Smoker   Smokeless tobacco: Never Used  Vaping Use   Vaping Use: Never used  Substance and Sexual Activity  Alcohol use: Not Currently   Drug use: Never   Sexual activity: Not Currently  Other Topics Concern   Not on file  Social History Narrative   ** Merged History Encounter **       Married, 2 children. Retired from [employer] Strain:    Difficulty of Paying Living Expenses:   Food Insecurity:    Worried About Charity fundraiser in the Last Year:    Arboriculturist in the Last Year:   Transportation Needs:    Film/video editor (Medical):    Lack of Transportation (Non-Medical):   Physical Activity:    Days of Exercise per Week:    Minutes of Exercise per Session:   Stress:    Feeling of Stress :   Social Connections:    Frequency of Communication with Friends and Family:    Frequency of Social Gatherings with Friends and Family:    Attends Religious Services:    Active Member of Clubs or Organizations:    Attends Music therapist:    Marital Status:   Intimate Partner Violence:    Fear of Current or Ex-Partner:    Emotionally Abused:    Physically Abused:    Sexually Abused:    Family History  Problem Relation Age of Onset   Allergies Father    Cancer Father    Coronary artery  disease Mother 10   Heart disease Mother    CAD Sister 39   Cancer Sister    AAA (abdominal aortic aneurysm) Sister    Hypertension Sister    Hyperlipidemia Sister    Diabetes Sister    Heart disease Sister    Diabetes Maternal Aunt    Allergic rhinitis Neg Hx    Asthma Neg Hx    Eczema Neg Hx    Urticaria Neg Hx       Review of Systems  All other systems reviewed and are negative.      Objective:   Physical Exam Vitals reviewed.  Constitutional:      General: She is not in acute distress.    Appearance: She is well-developed. She is not diaphoretic.  Neck:     Thyroid: Thyromegaly present.     Vascular: No JVD.     Trachea: No tracheal deviation.  Cardiovascular:     Rate and Rhythm: Normal rate and regular rhythm.     Heart sounds: Normal heart sounds. No murmur heard.   Pulmonary:     Effort: Pulmonary effort is normal. No respiratory distress.     Breath sounds: Normal breath sounds. No wheezing or rales.  Abdominal:     General: Bowel sounds are normal. There is no distension.     Palpations: Abdomen is soft.     Tenderness: There is no abdominal tenderness. There is no guarding or rebound.  Musculoskeletal:     Cervical back: Neck supple.  Lymphadenopathy:     Cervical: No cervical adenopathy.  Neurological:     Mental Status: She is alert and oriented to person, place, and time.     Cranial Nerves: No cranial nerve deficit.     Motor: No abnormal muscle tone.     Coordination: Coordination normal.     Deep Tendon Reflexes: Reflexes are normal and symmetric.          Assessment & Plan:  Coronary artery disease involving native coronary artery of native heart without  angina pectoris  General medical exam  Pure hypercholesterolemia  Multinodular goiter  Prediabetes - Plan: Hemoglobin A1c  Blood work was significant for a fasting blood sugar in the diabetic range.  Therefore I will check an A1c.  Otherwise recent CMP was normal  showing normal renal function.  Thyroid was just checked in June and was up-to-date and was normal.  Await the results of her A1c.  The remainder of her preventative care is up-to-date.  She denies any depression.  She does have some mild memory loss for which I have referred her to a neurologist.  She also suffered a fall earlier this year resulting in a concussion.  Therefore the patient is a moderate fall risk.  Goiter seems to be stable although very prominent on exam however she denies any difficulty breathing or swallowing.

## 2020-07-09 ENCOUNTER — Telehealth: Payer: Self-pay

## 2020-07-09 LAB — HEMOGLOBIN A1C
Hgb A1c MFr Bld: 5.6 % of total Hgb (ref <5.7)
Mean Plasma Glucose: 114 (calc)
eAG (mmol/L): 6.3 (calc)

## 2020-07-09 NOTE — Telephone Encounter (Signed)
Pt daughter called in regards to her being diabetic, she's refusing to eat anything, vegetables, fruits. Her daughter is needing some support in clarifying that she's able to eat, and given her mental state, they are battling her nutrition needs.

## 2020-07-10 NOTE — Telephone Encounter (Signed)
Call placed to patient and patient daughter Karna Christmas and both parties made aware of PCP recommendations.

## 2020-07-10 NOTE — Telephone Encounter (Signed)
Her diabetes test came back fine.  The blood sugar that morning was a fluke.  I am fine with her eating fruits.  However if she is losing weight and not getting adequate nutrition, she would be better off drinking 1 to 2 cans of Ensure or Glucerna per day.  These are packed with protein.  Protein is more beneficial for wound care, nutrition, and building muscle then simple sugar.  I would focus on a diet rich in vegetables and proteins such as chicken and fish and [place]

## 2020-07-14 ENCOUNTER — Ambulatory Visit: Payer: Medicare Other | Admitting: Neurology

## 2020-07-14 ENCOUNTER — Other Ambulatory Visit: Payer: Self-pay

## 2020-07-14 ENCOUNTER — Other Ambulatory Visit: Payer: Medicare Other

## 2020-07-14 ENCOUNTER — Ambulatory Visit: Payer: Medicare Other

## 2020-07-14 DIAGNOSIS — E538 Deficiency of other specified B group vitamins: Secondary | ICD-10-CM

## 2020-07-14 MED ORDER — CYANOCOBALAMIN 1000 MCG/ML IJ SOLN
1000.0000 ug | Freq: Once | INTRAMUSCULAR | Status: AC
  Administered 2020-08-18: 1000 ug via INTRAMUSCULAR

## 2020-07-21 ENCOUNTER — Encounter: Payer: Self-pay | Admitting: Family Medicine

## 2020-08-08 ENCOUNTER — Ambulatory Visit: Payer: Medicare Other | Admitting: Allergy

## 2020-08-13 ENCOUNTER — Ambulatory Visit: Payer: Medicare Other | Admitting: Neurology

## 2020-08-15 ENCOUNTER — Other Ambulatory Visit: Payer: Self-pay | Admitting: Family Medicine

## 2020-08-18 ENCOUNTER — Ambulatory Visit (INDEPENDENT_AMBULATORY_CARE_PROVIDER_SITE_OTHER): Payer: Medicare Other | Admitting: *Deleted

## 2020-08-18 ENCOUNTER — Other Ambulatory Visit: Payer: Self-pay

## 2020-08-18 DIAGNOSIS — E538 Deficiency of other specified B group vitamins: Secondary | ICD-10-CM

## 2020-08-21 ENCOUNTER — Other Ambulatory Visit: Payer: Self-pay

## 2020-08-21 MED ORDER — ROSUVASTATIN CALCIUM 20 MG PO TABS: 20.0000 mg | ORAL_TABLET | Freq: Every day | ORAL | 3 refills | Status: DC

## 2020-08-22 ENCOUNTER — Other Ambulatory Visit: Payer: Self-pay | Admitting: Family Medicine

## 2020-09-03 ENCOUNTER — Ambulatory Visit: Payer: Medicare Other | Admitting: Allergy

## 2020-09-15 ENCOUNTER — Ambulatory Visit (INDEPENDENT_AMBULATORY_CARE_PROVIDER_SITE_OTHER): Payer: Medicare Other

## 2020-09-15 ENCOUNTER — Other Ambulatory Visit: Payer: Self-pay

## 2020-09-15 DIAGNOSIS — E538 Deficiency of other specified B group vitamins: Secondary | ICD-10-CM

## 2020-09-15 MED ORDER — CYANOCOBALAMIN 1000 MCG/ML IJ SOLN
1000.0000 ug | Freq: Once | INTRAMUSCULAR | Status: AC
  Administered 2020-09-15: 1000 ug via INTRAMUSCULAR

## 2020-10-14 ENCOUNTER — Other Ambulatory Visit: Payer: Self-pay | Admitting: Family Medicine

## 2020-10-21 ENCOUNTER — Ambulatory Visit (INDEPENDENT_AMBULATORY_CARE_PROVIDER_SITE_OTHER): Payer: Medicare Other

## 2020-10-21 ENCOUNTER — Other Ambulatory Visit: Payer: Self-pay

## 2020-10-21 DIAGNOSIS — E538 Deficiency of other specified B group vitamins: Secondary | ICD-10-CM | POA: Diagnosis not present

## 2020-10-21 MED ORDER — CYANOCOBALAMIN 1000 MCG/ML IJ SOLN
1000.0000 ug | Freq: Once | INTRAMUSCULAR | Status: AC
  Administered 2020-10-21: 1000 ug via INTRAMUSCULAR

## 2020-11-07 ENCOUNTER — Ambulatory Visit (INDEPENDENT_AMBULATORY_CARE_PROVIDER_SITE_OTHER): Payer: Medicare Other | Admitting: Family Medicine

## 2020-11-07 ENCOUNTER — Other Ambulatory Visit: Payer: Self-pay

## 2020-11-07 ENCOUNTER — Encounter: Payer: Self-pay | Admitting: Family Medicine

## 2020-11-07 VITALS — BP 120/70 | HR 77 | Temp 97.9°F | Ht 66.0 in | Wt 156.0 lb

## 2020-11-07 DIAGNOSIS — I2510 Atherosclerotic heart disease of native coronary artery without angina pectoris: Secondary | ICD-10-CM

## 2020-11-07 DIAGNOSIS — F0390 Unspecified dementia without behavioral disturbance: Secondary | ICD-10-CM

## 2020-11-07 DIAGNOSIS — E042 Nontoxic multinodular goiter: Secondary | ICD-10-CM | POA: Diagnosis not present

## 2020-11-07 DIAGNOSIS — E538 Deficiency of other specified B group vitamins: Secondary | ICD-10-CM

## 2020-11-07 DIAGNOSIS — E7800 Pure hypercholesterolemia, unspecified: Secondary | ICD-10-CM | POA: Diagnosis not present

## 2020-11-07 NOTE — Progress Notes (Signed)
Subjective:    Patient ID: Lindsey Erickson, female    DOB: 28-Nov-1931, 86 y.o.   MRN: [id number]  HPI  06/2020 Patient is a very pleasant 86 year old white female who is here for CPE. She has a significant past medical history of coronary artery disease. According to her report she is suffered 2 separate myocardial infarctions. However on repeat catheterizations, the patient states that she has lesions which are not amenable to stenting. Therefore she is being managed medically. She is currently on a long-acting nitroglycerin, beta blocker, and ACE inhibitor, statin medication, and dual antiplatelet therapy. She follows up regularly with her cardiologist.  She is currently on Synthroid to help suppress the growth of her goiter.  Patient has elected not to pursue mammograms further.  Her last bone density test was 2018 and was excellent.  Therefore we have elected to defer this for 5 years until 2023.  Due to her age she does not require a Pap smear or colonoscopy.  Her immunizations are listed below.  She is due for Shingrix as well as a flu shot this fall.  Otherwise her immunizations are up-to-date.  She is seeing neurology due to persistent memory loss following a concussion.  At that time, my plan was: Blood work was significant for a fasting blood sugar in the diabetic range.  Therefore I will check an A1c.  Otherwise recent CMP was normal showing normal renal function.  Thyroid was just checked in June and was up-to-date and was normal.  Await the results of her A1c.  The remainder of her preventative care is up-to-date.  She denies any depression.  She does have some mild memory loss for which I have referred her to a neurologist.  She also suffered a fall earlier this year resulting in a concussion.  Therefore the patient is a moderate fall risk.  Goiter seems to be stable although very prominent on exam however she denies any difficulty breathing or swallowing.  11/07/20 In June we found that  the patient's B12 levels were low at 185.  She started B12 replacement at that time.  She has been on it now for a total of 6 months.  She states that even up until September she was having noticeable memory loss.  It had gotten to the point that she allowed her driver's license to expire and sold her car.  She was seen neurology who even started her on Aricept 5 mg a day for possible dementia.  However the patient today seems remarkably better.  She states that she is "back".  She states that she feels fine now.  She feels like her memory has improved dramatically.  She is back to managing her finances including her checkbook.  She even is driving around the house.  She is back to doing her housework and managing her personal affairs.  She states that her memory is back to her baseline.  I am not certain if the concussion triggered the memory loss or if the B12 trigger the memory loss or if it could have been a perfect storm but it does dramatically seem to have improved.  Therefore I do not feel that this is a sign of Alzheimer's disease given the fact that it has improved.  I question if is the B12 replacement or simply the passage of time but she certainly seems better.  She denies any depression.  Her anxiety level is much improved.  Her blood pressure today is excellent.  She is  due to recheck a B12 level.  She is currently receiving the injections monthly.  She is also due to recheck her TSH to monitor her hypothyroidism. Past Medical History:  Diagnosis Date  . Anemia   . Angio-edema   . Arthritis   . Blood transfusion without reported diagnosis 1957  . CAD (coronary artery disease)    a. NSTEMI 2004 with thrombus in Cx and mod RCA. b. Nonischemic nuc 2008. c. mild NSTEMI 01/2013 (trop 0.42) -> cath with unchanged mod LAD & RCA disease, for continued medical therapy.  . Cancer ([place])   . Cataract   . Dementia ([place])   . H/O Hashimoto thyroiditis   . Hyperlipidemia   . HYPERTENSION    a. Also has  h/o intermittentl low BP.  Marland Kitchen Hypothyroidism   . Kidney stones   . Myocardial infarction ([place])   . Osteoporosis    no fractures  . Prediabetes   . QT prolongation    a. Prior EKGs QTc 474, but on 4/1 was 524 for unclear reason.  . Recurrent urinary tract infection   . Squamous cell carcinoma of skin 04/17/2015  . Urticaria    Past Surgical History:  Procedure Laterality Date  . ADENOIDECTOMY    . APPENDECTOMY  1999  . Bilateral Arthroscopies of knees    . CARDIAC CATHETERIZATION     Moderate 3 vessel CAD by cath 05/2012  . CHOLECYSTECTOMY  02/2010  . JOINT REPLACEMENT  01/2010   L TKR - allusio  . LEFT HEART CATHETERIZATION WITH CORONARY ANGIOGRAM N/A 05/22/2012   Procedure: LEFT HEART CATHETERIZATION WITH CORONARY ANGIOGRAM;  Surgeon: Sinclair Grooms, MD;  Location: [facility] CATH LAB;  Service: Cardiovascular;  Laterality: N/A;  . LEFT HEART CATHETERIZATION WITH CORONARY ANGIOGRAM N/A 02/20/2014   Procedure: LEFT HEART CATHETERIZATION WITH CORONARY ANGIOGRAM;  Surgeon: Peter M Martinique, MD;  Location: [facility] CATH LAB;  Service: Cardiovascular;  Laterality: N/A;  . TONSILLECTOMY    . TUBAL LIGATION     Current Outpatient Medications on File Prior to Visit  Medication Sig Dispense Refill  . acetaminophen (TYLENOL) 500 MG tablet Take 500 mg by mouth every 6 (six) hours as needed for moderate pain.    . Ascorbic Acid (VITAMIN C) 1000 MG tablet Take 1,000 mg by mouth daily.     . carvedilol (COREG CR) 40 MG 24 hr capsule TAKE 1 CAPSULE EVERY EVENING 90 capsule 3  . clopidogrel (PLAVIX) 75 MG tablet TAKE 1 TABLET DAILY 90 tablet 3  . conjugated estrogens (PREMARIN) vaginal cream Place 1 Applicatorful vaginally 2 (two) times a week. Tuesday and Saturday.    . Cyanocobalamin 2000 MCG/ML SOLN Inject as directed.    . famotidine (PEPCID) 20 MG tablet Take 1 tablet (20 mg total) by mouth 2 (two) times daily. 180 tablet 1  . fexofenadine (ALLEGRA) 180 MG tablet TAKE 1 TABLET TWICE A DAY 180 tablet 3  .  fluconazole (DIFLUCAN) 100 MG tablet Take 100 mg by mouth daily.     . hydrochlorothiazide (MICROZIDE) 12.5 MG capsule TAKE 1 CAPSULE DAILY 90 capsule 3  . isosorbide mononitrate (IMDUR) 60 MG 24 hr tablet TAKE 1 TABLET DAILY 90 tablet 3  . mometasone (ELOCON) 0.1 % cream Apply 1 application topically daily. 45 g 0  . Multiple Vitamins-Minerals (CENTRUM SILVER 50+WOMEN PO) Take 1 tablet by mouth daily.    . nitroGLYCERIN (NITROSTAT) 0.4 MG SL tablet DISSOLVE 1 TABLET UNDER THE TONGUE EVERY 5 MINUTES AS NEEDED FOR CHEST  PAIN 75 tablet 3  . OVER THE COUNTER MEDICATION Take 1 capsule by mouth daily. Cranberry D - Mannose    . PEPCID 20 MG tablet Take 20 mg by mouth as needed (angioedema).     . predniSONE (DELTASONE) 20 MG tablet Take 20 mg by mouth as needed (angioedema).     . rosuvastatin (CRESTOR) 20 MG tablet TAKE 1 TABLET AT BEDTIME 90 tablet 3  . saccharomyces boulardii (FLORASTOR) 250 MG capsule Take 250 mg by mouth daily.    Marland Kitchen SYNTHROID 50 MCG tablet TAKE 1 TABLET ALTERNATING WITH ONE AND ONE-HALF TABLETS AS DIRECTED 108 tablet 3  . trimethoprim (TRIMPEX) 100 MG tablet Take 100 mg by mouth daily.     No current facility-administered medications on file prior to visit.   Allergies  Allergen Reactions  . Ciprofloxacin Hives  . 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   ALL NARCOTICS- NAUSEA AND VOMITING   . Adhesive [Tape] Itching, Swelling, Rash and Other (See Comments)    Also pain at location  . Codeine Nausea And Vomiting  . Bactrim [Sulfamethoxazole-Trimethoprim]     Muscle aches  . Cephalexin Hives  . Lisinopril Swelling  . Macrobid [Nitrofurantoin Monohyd Macro] Nausea And Vomiting  . Sulfur Hives and Nausea Only  . Tamiflu [Oseltamivir Phosphate]    Social History   Socioeconomic History  . Marital status: Married    Spouse name: Not on file  . Number of children: Not on file  .  Years of education: Not on file  . Highest education level: Not on file  Occupational History  . Not on file  Tobacco Use  . Smoking status: Never Smoker  . Smokeless tobacco: Never Used  Vaping Use  . Vaping Use: Never used  Substance and Sexual Activity  . Alcohol use: Not Currently  . Drug use: Never  . Sexual activity: Not Currently  Other Topics Concern  . Not on file  Social History Narrative   ** Merged History Encounter **       Married, 2 children. Retired from [employer] Strain: Not on file  Food Insecurity: Not on file  Transportation Needs: Not on file  Physical Activity: Not on file  Stress: Not on file  Social Connections: Not on file  Intimate Partner Violence: Not on file   Family History  Problem Relation Age of Onset  . Allergies Father   . Cancer Father   . Coronary artery disease Mother 6  . Heart disease Mother   . CAD Sister 75  . Cancer Sister   . AAA (abdominal aortic aneurysm) Sister   . Hypertension Sister   . Hyperlipidemia Sister   . Diabetes Sister   . Heart disease Sister   . Diabetes Maternal Aunt   . Allergic rhinitis Neg Hx   . Asthma Neg Hx   . Eczema Neg Hx   . Urticaria Neg Hx       Review of Systems  All other systems reviewed and are negative.      Objective:   Physical Exam Vitals reviewed.  Constitutional:      General: She is not in acute distress.    Appearance: She is well-developed. She is not diaphoretic.  Neck:     Thyroid: Thyromegaly present.     Vascular: No JVD.  Trachea: No tracheal deviation.  Cardiovascular:     Rate and Rhythm: Normal rate and regular rhythm.     Heart sounds: Normal heart sounds. No murmur heard.   Pulmonary:     Effort: Pulmonary effort is normal. No respiratory distress.     Breath sounds: Normal breath sounds. No wheezing or rales.  Abdominal:     General: Bowel sounds are normal. There is no distension.      Palpations: Abdomen is soft.     Tenderness: There is no abdominal tenderness. There is no guarding or rebound.  Musculoskeletal:     Cervical back: Neck supple.  Lymphadenopathy:     Cervical: No cervical adenopathy.  Neurological:     Mental Status: She is alert and oriented to person, place, and time.     Cranial Nerves: No cranial nerve deficit.     Motor: No abnormal muscle tone.     Coordination: Coordination normal.     Deep Tendon Reflexes: Reflexes are normal and symmetric.          Assessment & Plan:  Coronary artery disease involving native coronary artery of native heart without angina pectoris - Plan: CBC with Differential/Platelet, COMPLETE METABOLIC PANEL WITH GFR, Lipid panel, Vitamin B12  Dementia without behavioral disturbance, unspecified dementia type ([place])  Multinodular goiter - Plan: TSH  Pure hypercholesterolemia  B12 deficiency - Plan: Vitamin B12  I am extremely happy to hear that the patient has improved.  I believe is a combination of the passage of time and healing from the concussion but also believe the majority was likely due to B12 deficiency.  Therefore I have strongly encouraged the patient to continue long-term replacement with vitamin B12.  I have recommended parenteral B12 indefinitely.  She will need 1000 mcg monthly.  However I am comfortable with the patient doing this herself at home as long as she feels comfortable with this.  We have been providing the injections up into this point.  I am happy to continue that service but have asked the patient to check with her family and if someone feels comfortable giving her the intermuscular injection I will be glad to prescribe the materials for her to use it at home to save her trip.  Therefore although dementia is listed on her problem list, I believe more of this may have been related to B12 deficiency.  Check a TSH, CBC, CMP, and a fasting lipid panel.  Ideally I like to see her LDL cholesterol below  70.  Blood pressure is outstanding today.  No changes are made at this time.  Of note the patient is taking daily trimethoprim for prevention of urinary tract infections and this seems to be working extremely well as she has not had a bladder infection in quite some time

## 2020-11-08 LAB — COMPLETE METABOLIC PANEL WITH GFR
AG Ratio: 1.6 (calc) (ref 1.0–2.5)
ALT: 15 U/L (ref 6–29)
AST: 20 U/L (ref 10–35)
Albumin: 4 g/dL (ref 3.6–5.1)
Alkaline phosphatase (APISO): 70 U/L (ref 37–153)
BUN/Creatinine Ratio: 21 (calc) (ref 6–22)
BUN: 21 mg/dL (ref 7–25)
CO2: 31 mmol/L (ref 20–32)
Calcium: 9.5 mg/dL (ref 8.6–10.4)
Chloride: 102 mmol/L (ref 98–110)
Creat: 1.01 mg/dL — ABNORMAL HIGH (ref 0.60–0.88)
GFR, Est African American: 58 mL/min/{1.73_m2} — ABNORMAL LOW (ref >=60)
GFR, Est Non African American: 50 mL/min/{1.73_m2} — ABNORMAL LOW (ref >=60)
Globulin: 2.5 g/dL (calc) (ref 1.9–3.7)
Glucose, Bld: 105 mg/dL — ABNORMAL HIGH (ref 65–99)
Potassium: 4.9 mmol/L (ref 3.5–5.3)
Sodium: 139 mmol/L (ref 135–146)
Total Bilirubin: 0.6 mg/dL (ref 0.2–1.2)
Total Protein: 6.5 g/dL (ref 6.1–8.1)

## 2020-11-08 LAB — CBC WITH DIFFERENTIAL/PLATELET
Absolute Monocytes: 530 cells/uL (ref 200–950)
Basophils Absolute: 62 cells/uL (ref 0–200)
Basophils Relative: 1.2 %
Eosinophils Absolute: 161 cells/uL (ref 15–500)
Eosinophils Relative: 3.1 %
HCT: 41.1 % (ref 35.0–45.0)
Hemoglobin: 13.8 g/dL (ref 11.7–15.5)
Lymphs Abs: 1544 cells/uL (ref 850–3900)
MCH: 32.5 pg (ref 27.0–33.0)
MCHC: 33.6 g/dL (ref 32.0–36.0)
MCV: 96.9 fL (ref 80.0–100.0)
MPV: 9.8 fL (ref 7.5–12.5)
Monocytes Relative: 10.2 %
Neutro Abs: 2902 cells/uL (ref 1500–7800)
Neutrophils Relative %: 55.8 %
Platelets: 235 10*3/uL (ref 140–400)
RBC: 4.24 10*6/uL (ref 3.80–5.10)
RDW: 11.7 % (ref 11.0–15.0)
Total Lymphocyte: 29.7 %
WBC: 5.2 10*3/uL (ref 3.8–10.8)

## 2020-11-08 LAB — LIPID PANEL
Cholesterol: 170 mg/dL (ref <200)
HDL: 80 mg/dL (ref >=50)
LDL Cholesterol (Calc): 72 mg/dL (calc)
Non-HDL Cholesterol (Calc): 90 mg/dL (calc) (ref <130)
Total CHOL/HDL Ratio: 2.1 (calc) (ref <5.0)
Triglycerides: 96 mg/dL (ref <150)

## 2020-11-08 LAB — VITAMIN B12: Vitamin B-12: 365 pg/mL (ref 200–1100)

## 2020-11-08 LAB — TSH: TSH: 1.57 mIU/L (ref 0.40–4.50)

## 2020-11-12 ENCOUNTER — Other Ambulatory Visit: Payer: Self-pay | Admitting: Family Medicine

## 2020-11-13 ENCOUNTER — Other Ambulatory Visit: Payer: Self-pay | Admitting: Family Medicine

## 2020-11-13 ENCOUNTER — Telehealth: Payer: Self-pay | Admitting: Family Medicine

## 2020-11-13 MED ORDER — ISOSORBIDE MONONITRATE ER 60 MG PO TB24: 60.0000 mg | ORAL_TABLET | Freq: Every day | ORAL | 3 refills | Status: DC

## 2020-11-13 NOTE — Telephone Encounter (Signed)
Refill Imdur 60 MG -need to change her pharmacy to [facility] on [address]

## 2020-11-17 NOTE — Telephone Encounter (Signed)
Medication filled on 11/13/2020.

## 2020-11-18 ENCOUNTER — Other Ambulatory Visit: Payer: Self-pay

## 2020-11-18 ENCOUNTER — Other Ambulatory Visit (INDEPENDENT_AMBULATORY_CARE_PROVIDER_SITE_OTHER): Payer: Medicare Other | Admitting: *Deleted

## 2020-11-18 DIAGNOSIS — E539 Vitamin B deficiency, unspecified: Secondary | ICD-10-CM | POA: Diagnosis not present

## 2020-11-18 DIAGNOSIS — E039 Hypothyroidism, unspecified: Secondary | ICD-10-CM

## 2020-11-18 DIAGNOSIS — F0390 Unspecified dementia without behavioral disturbance: Secondary | ICD-10-CM

## 2020-11-18 DIAGNOSIS — R7303 Prediabetes: Secondary | ICD-10-CM

## 2020-11-18 DIAGNOSIS — E7800 Pure hypercholesterolemia, unspecified: Secondary | ICD-10-CM

## 2020-11-18 DIAGNOSIS — I10 Essential (primary) hypertension: Secondary | ICD-10-CM

## 2020-11-18 MED ORDER — CYANOCOBALAMIN 1000 MCG/ML IJ SOLN
1000.0000 ug | INTRAMUSCULAR | Status: DC
  Administered 2020-11-18 – 2022-05-03 (×18): 1000 ug via INTRAMUSCULAR

## 2020-11-18 NOTE — Progress Notes (Signed)
Patient seen in office for Vitamin B 12 injection.   Tolerated IM administration well.  

## 2020-12-03 ENCOUNTER — Other Ambulatory Visit: Payer: Self-pay

## 2020-12-03 ENCOUNTER — Encounter: Payer: Self-pay | Admitting: Allergy

## 2020-12-03 ENCOUNTER — Ambulatory Visit (INDEPENDENT_AMBULATORY_CARE_PROVIDER_SITE_OTHER): Payer: Medicare Other | Admitting: Allergy

## 2020-12-03 VITALS — BP 120/78 | HR 76 | Temp 97.7°F | Resp 16 | Ht 66.0 in | Wt 158.6 lb

## 2020-12-03 DIAGNOSIS — T783XXD Angioneurotic edema, subsequent encounter: Secondary | ICD-10-CM

## 2020-12-03 NOTE — Patient Instructions (Addendum)
Idiopathic Angioedema (lip swelling)     - episodic angioedema can be due to variety of different triggers.  ACE-I (lisinopril) is a known medication that can cause angioedema of the oral cavity.   The swelling can continue for months even despite stopping the medication.  However you have been off lisinopril for a long-time at this point.  This category of medications should not be taken again in the future.      - Labs for swelling were largely unremarkable except for sensitivity to elm tree pollen and Paprika.     - avoid foods that contain Paprika in ingredients list.     - swelling episodes been less severe and much less frequent   - continue current regimen with allegra and pepcid for flare-ups as needed   - reserve prednisone for more severe swelling events.  If lip swelling take 2 prednisone tablets (20mg  total) per day if needed   - have access to epipen device in case of allergic reaction and follow emergency action plan   Follow-up in August 2022

## 2020-12-03 NOTE — Progress Notes (Signed)
Follow-up Note  RE: Lindsey Erickson MRN: [id number] DOB: 1932/09/11 Date of Office Visit: 12/03/2020   History of present illness: Lindsey Erickson is a 87 y.o. female presenting today for follow-up of idiopathic angioedema.  She was last seen in the office on 06/26/20 by myself.  He has been doing much better since her last visit.  She states after everything healed from her fall that her memory is back to normal and she is doing well.  She has not had any swelling episodes since last visit.  She has been avoiding foods that contain paprika.  She has not needed to use the Allegra Pepcid as she has not had any swelling episodes.  She also states the prednisone pack that she has expired that since she did not need to use it.  She also has an epinephrine device that she has not needed to use either.   Review of systems: Review of Systems  Constitutional: Negative.   HENT: Negative.   Eyes: Negative.   Respiratory: Negative.   Cardiovascular: Negative.   Gastrointestinal: Negative.   Musculoskeletal: Negative.   Skin: Negative.   Neurological: Negative.     All other systems negative unless noted above in HPI  Past medical/social/surgical/family history have been reviewed and are unchanged unless specifically indicated below.  No changes  Medication List: Current Outpatient Medications  Medication Sig Dispense Refill  . acetaminophen (TYLENOL) 500 MG tablet Take 500 mg by mouth every 6 (six) hours as needed for moderate pain.    . Ascorbic Acid (VITAMIN C) 1000 MG tablet Take 1,000 mg by mouth daily.     . carvedilol (COREG CR) 40 MG 24 hr capsule TAKE 1 CAPSULE EVERY EVENING 90 capsule 3  . clopidogrel (PLAVIX) 75 MG tablet TAKE 1 TABLET DAILY 90 tablet 3  . conjugated estrogens (PREMARIN) vaginal cream Place 1 Applicatorful vaginally 2 (two) times a week. Tuesday and Saturday.    . Cyanocobalamin 2000 MCG/ML SOLN Inject as directed.    . donepezil (ARICEPT) 5 MG tablet Take  by mouth.    . EPINEPHrine 0.3 mg/0.3 mL IJ SOAJ injection SMARTSIG:0.3 Milliliter(s) IM Once PRN    . famotidine (PEPCID) 20 MG tablet Take 1 tablet (20 mg total) by mouth 2 (two) times daily. 180 tablet 1  . fexofenadine (ALLEGRA) 180 MG tablet TAKE 1 TABLET TWICE A DAY 180 tablet 3  . fluconazole (DIFLUCAN) 100 MG tablet Take 100 mg by mouth daily.     . hydrochlorothiazide (MICROZIDE) 12.5 MG capsule TAKE 1 CAPSULE DAILY 90 capsule 3  . isosorbide mononitrate (IMDUR) 60 MG 24 hr tablet TAKE 1 TABLET DAILY 90 tablet 3  . mometasone (ELOCON) 0.1 % cream Apply 1 application topically daily. 45 g 0  . Multiple Vitamins-Minerals (CENTRUM SILVER 50+WOMEN PO) Take 1 tablet by mouth daily.    . nitroGLYCERIN (NITROSTAT) 0.4 MG SL tablet DISSOLVE 1 TABLET UNDER THE TONGUE EVERY 5 MINUTES AS NEEDED FOR CHEST PAIN 75 tablet 3  . OVER THE COUNTER MEDICATION Take 1 capsule by mouth daily. Cranberry D - Mannose    . PEPCID 20 MG tablet Take 20 mg by mouth as needed (angioedema).     . predniSONE (DELTASONE) 20 MG tablet Take 20 mg by mouth as needed (angioedema).    . rosuvastatin (CRESTOR) 20 MG tablet TAKE 1 TABLET AT BEDTIME 90 tablet 3  . saccharomyces boulardii (FLORASTOR) 250 MG capsule Take 250 mg by mouth daily.    Marland Kitchen  SYNTHROID 50 MCG tablet TAKE 1 TABLET ALTERNATING WITH ONE AND ONE-HALF TABLETS AS DIRECTED 108 tablet 3  . trimethoprim (TRIMPEX) 100 MG tablet Take 100 mg by mouth daily.     Current Facility-Administered Medications  Medication Dose Route Frequency Provider Last Rate Last Admin  . cyanocobalamin ((VITAMIN B-12)) injection 1,000 mcg  1,000 mcg Intramuscular Q30 days Susy Frizzle, MD   1,000 mcg at 11/18/20 0093     Known medication allergies: Allergies  Allergen Reactions  . Ciprofloxacin Hives  . 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   ALL NARCOTICS- NAUSEA AND VOMITING   .  Adhesive [Tape] Itching, Swelling, Rash and Other (See Comments)    Also pain at location  . Codeine Nausea And Vomiting  . Bactrim [Sulfamethoxazole-Trimethoprim]     Muscle aches  . Cephalexin Hives  . Lisinopril Swelling  . Macrobid [Nitrofurantoin Monohyd Macro] Nausea And Vomiting  . Sulfur Hives and Nausea Only  . Tamiflu [Oseltamivir Phosphate]      Physical examination: Blood pressure 120/78, pulse 76, temperature 97.7 F (36.5 C), resp. rate 16, height 5\' 6"  (1.676 m), weight 158 lb 9.6 oz (71.9 kg), SpO2 96 %.  General: Alert, interactive, in no acute distress. HEENT: PERRLA, TMs pearly gray, turbinates non-edematous without discharge, post-pharynx non erythematous. Neck: Supple without lymphadenopathy. Lungs: Clear to auscultation without wheezing, rhonchi or rales. {no increased work of breathing. CV: Normal S1, S2 without murmurs. Abdomen: Nondistended, nontender. Skin: Warm and dry, without lesions or rashes. Extremities:  No clubbing, cyanosis or edema. Neuro:   Grossly intact.  Diagnositics/Labs: None today  Assessment and plan:   Idiopathic Angioedema (lip swelling)     - episodic angioedema can be due to variety of different triggers.  ACE-I (lisinopril) is a known medication that can cause angioedema of the oral cavity.   The swelling can continue for months even despite stopping the medication.  However you have been off lisinopril for a long-time at this point.  This category of medications should not be taken again in the future.      - Labs for swelling were largely unremarkable except for sensitivity to elm tree pollen and Paprika.     - avoid foods that contain Paprika in ingredients list.     - swelling episodes been less severe and much less frequent   - continue current regimen with allegra and pepcid for flare-ups as needed   - reserve prednisone for more severe swelling events.  If lip swelling take 2 prednisone tablets (20mg  total) per day if  needed   - have access to epipen device in case of allergic reaction and follow emergency action plan   Follow-up in August 2022     I appreciate the opportunity to take part in [place] care. Please do not hesitate to contact me with questions.  Sincerely,   Prudy Feeler, MD Allergy/Immunology [facility] [place]

## 2020-12-22 ENCOUNTER — Other Ambulatory Visit: Payer: Self-pay

## 2020-12-22 ENCOUNTER — Ambulatory Visit (INDEPENDENT_AMBULATORY_CARE_PROVIDER_SITE_OTHER): Payer: Medicare Other | Admitting: *Deleted

## 2020-12-22 DIAGNOSIS — E538 Deficiency of other specified B group vitamins: Secondary | ICD-10-CM

## 2020-12-22 DIAGNOSIS — E539 Vitamin B deficiency, unspecified: Secondary | ICD-10-CM

## 2021-01-19 ENCOUNTER — Ambulatory Visit (INDEPENDENT_AMBULATORY_CARE_PROVIDER_SITE_OTHER): Payer: Medicare Other | Admitting: Family Medicine

## 2021-01-19 ENCOUNTER — Other Ambulatory Visit: Payer: Self-pay

## 2021-01-19 DIAGNOSIS — E538 Deficiency of other specified B group vitamins: Secondary | ICD-10-CM

## 2021-02-01 ENCOUNTER — Other Ambulatory Visit: Payer: Self-pay | Admitting: Endocrinology

## 2021-02-01 DIAGNOSIS — R7303 Prediabetes: Secondary | ICD-10-CM

## 2021-02-01 DIAGNOSIS — E042 Nontoxic multinodular goiter: Secondary | ICD-10-CM

## 2021-02-01 DIAGNOSIS — E7800 Pure hypercholesterolemia, unspecified: Secondary | ICD-10-CM

## 2021-02-02 ENCOUNTER — Other Ambulatory Visit: Payer: Self-pay

## 2021-02-02 ENCOUNTER — Other Ambulatory Visit (INDEPENDENT_AMBULATORY_CARE_PROVIDER_SITE_OTHER): Payer: Medicare Other

## 2021-02-02 DIAGNOSIS — E042 Nontoxic multinodular goiter: Secondary | ICD-10-CM | POA: Diagnosis not present

## 2021-02-02 DIAGNOSIS — R7303 Prediabetes: Secondary | ICD-10-CM

## 2021-02-02 LAB — T4, FREE: Free T4: 1.05 ng/dL (ref 0.60–1.60)

## 2021-02-02 LAB — GLUCOSE, RANDOM: Glucose, Bld: 100 mg/dL — ABNORMAL HIGH (ref 70–99)

## 2021-02-02 LAB — HEMOGLOBIN A1C: Hgb A1c MFr Bld: 5.8 % (ref 4.6–6.5)

## 2021-02-02 LAB — TSH: TSH: 1.83 u[IU]/mL (ref 0.35–4.50)

## 2021-02-05 ENCOUNTER — Other Ambulatory Visit: Payer: Self-pay

## 2021-02-05 ENCOUNTER — Encounter: Payer: Self-pay | Admitting: Endocrinology

## 2021-02-05 ENCOUNTER — Ambulatory Visit (INDEPENDENT_AMBULATORY_CARE_PROVIDER_SITE_OTHER): Payer: Medicare Other | Admitting: Endocrinology

## 2021-02-05 VITALS — BP 118/72 | HR 75 | Resp 18 | Ht 66.0 in | Wt 160.2 lb

## 2021-02-05 DIAGNOSIS — E7800 Pure hypercholesterolemia, unspecified: Secondary | ICD-10-CM

## 2021-02-05 DIAGNOSIS — R7303 Prediabetes: Secondary | ICD-10-CM | POA: Diagnosis not present

## 2021-02-05 DIAGNOSIS — E042 Nontoxic multinodular goiter: Secondary | ICD-10-CM

## 2021-02-05 NOTE — Progress Notes (Signed)
Patient ID: Lindsey Erickson, female   DOB: January 03, 1932, 87 y.o.   MRN: [id number]   Reason for Appointment:  Endocrinology followup     History of Present Illness:   She initially had a multinodular goiter in 1982 She has had 4 biopsies of her thyroid nodules subsequently with the last one in 2019 which were benign Her biopsies had shown a few follicular cells and lymphocytes Last biopsy showed benign hyperplastic nodule category II  Probable hypothyroidism:   Most likely she initially had been on thyroid suppression for the goiter rather than true hypothyroidism although previous records do indicate Hashimoto thyroiditis  The treatments that the patient  had taken include Synthroid 88 mcg for several years  This was reduced to 50 g in 11/16 because of low normal TSH of 0.36.           Because of her symptoms of feeling a little tired and cold even with normal TSH in the past her dose was increased from the 50 mcg dose with additional half tablet 3 times a week of her 50 mcg levothyroxine  Subsequent TSH levels have been normal consistently  She does not report any unusual fatigue  She is very regular with taking her levothyroxine before breakfast with the above regimen .  Lab Results  Component Value Date   TSH 1.83 02/02/2021   TSH 1.57 11/07/2020   TSH 1.19 05/15/2020   FREET4 1.05 02/02/2021   FREET4 1.03 01/29/2020   FREET4 0.91 07/31/2019    GOITER: She has had a long-standing enlargement, mostly in the midline This has been stable in size Recently has no symptoms of local pressure, difficulty swallowing or choking  Her thyroid enlargement has been unchanged in the last few years  She had a biopsy done elsewhere in 07/2018 ordered by her PCP and this was again benign, diagnosis was benign hyperplastic thyroid nodule, [place] category 2   PREDIABETES:   Her last fasting glucose previously has been as high as 129 in 2017 December At that time glucose  tolerance test did not show her glucose to be abnormal at 2 hours although it was 204 at 1 hour  She has not been on any medications Not able to exercise much because of knee pain Usually is trying to eat healthy although weight may be slightly higher recently  A1c has been consistently in the upper normal range, has been as low as 5.4 and now 5.8 Fasting glucose now 100  Previously has taken prednisone for her angioedema but not for quite some time   Wt Readings from Last 3 Encounters:  02/05/21 160 lb 3.2 oz (72.7 kg)  12/03/20 158 lb 9.6 oz (71.9 kg)  11/07/20 156 lb (70.8 kg)    Lab Results  Component Value Date   HGBA1C 5.8 02/02/2021   HGBA1C 5.6 07/08/2020   HGBA1C 5.9 01/29/2020   Lab Results  Component Value Date   LDLCALC 72 11/07/2020   CREATININE 1.01 (H) 11/07/2020     Allergies as of 02/05/2021      Reactions   Ciprofloxacin Hives   Other Other (See Comments)   Frozen Blood Plasma-caused patient to crash / cardiac arrest / CAN NEVER TAKE THIS AGAIN-PER MD'S.  ALL NARCOTICS- NAUSEA AND VOMITING    Adhesive [tape] Itching, Swelling, Rash, Other (See Comments)   Also pain at location   Codeine Nausea And Vomiting   Bactrim [sulfamethoxazole-trimethoprim]    Muscle aches   Cephalexin Hives  Elemental Sulfur Hives, Nausea Only   Lisinopril Swelling   Macrobid [nitrofurantoin Monohyd Macro] Nausea And Vomiting   Tamiflu [oseltamivir Phosphate]       Medication List       Accurate as of February 05, 2021  8:59 AM. If you have any questions, ask your nurse or doctor.        acetaminophen 500 MG tablet Commonly known as: TYLENOL Take 500 mg by mouth every 6 (six) hours as needed for moderate pain.   carvedilol 40 MG 24 hr capsule Commonly known as: COREG CR TAKE 1 CAPSULE EVERY EVENING   CENTRUM SILVER 50+WOMEN PO Take 1 tablet by mouth daily.   clopidogrel 75 MG tablet Commonly known as: PLAVIX TAKE 1 TABLET DAILY   conjugated estrogens  vaginal cream Commonly known as: PREMARIN Place 1 Applicatorful vaginally 2 (two) times a week. Tuesday and Saturday.   Cyanocobalamin 2000 MCG/ML Soln Inject as directed.   donepezil 5 MG tablet Commonly known as: ARICEPT Take by mouth.   EPINEPHrine 0.3 mg/0.3 mL Soaj injection Commonly known as: EPI-PEN SMARTSIG:0.3 Milliliter(s) IM Once PRN   famotidine 20 MG tablet Commonly known as: PEPCID Take 1 tablet (20 mg total) by mouth 2 (two) times daily.   Pepcid 20 MG tablet Generic drug: famotidine Take 20 mg by mouth as needed (angioedema).   fexofenadine 180 MG tablet Commonly known as: ALLEGRA TAKE 1 TABLET TWICE A DAY   fluconazole 100 MG tablet Commonly known as: DIFLUCAN Take 100 mg by mouth daily.   hydrochlorothiazide 12.5 MG capsule Commonly known as: MICROZIDE TAKE 1 CAPSULE DAILY   isosorbide mononitrate 60 MG 24 hr tablet Commonly known as: IMDUR TAKE 1 TABLET DAILY   mometasone 0.1 % cream Commonly known as: Elocon Apply 1 application topically daily.   nitroGLYCERIN 0.4 MG SL tablet Commonly known as: NITROSTAT DISSOLVE 1 TABLET UNDER THE TONGUE EVERY 5 MINUTES AS NEEDED FOR CHEST PAIN   OVER THE COUNTER MEDICATION Take 1 capsule by mouth daily. Cranberry D - Mannose   predniSONE 20 MG tablet Commonly known as: DELTASONE Take 20 mg by mouth as needed (angioedema).   rosuvastatin 20 MG tablet Commonly known as: CRESTOR TAKE 1 TABLET AT BEDTIME   saccharomyces boulardii 250 MG capsule Commonly known as: FLORASTOR Take 250 mg by mouth daily.   Synthroid 50 MCG tablet Generic drug: levothyroxine TAKE 1 TABLET ALTERNATING WITH ONE AND ONE-HALF TABLETS AS DIRECTED   trimethoprim 100 MG tablet Commonly known as: TRIMPEX Take 100 mg by mouth daily.   vitamin C 1000 MG tablet Take 1,000 mg by mouth daily.       Allergies:  Allergies  Allergen Reactions  . Ciprofloxacin Hives  . Other Other (See Comments)    Frozen Blood  Plasma-caused patient to crash / cardiac arrest / CAN NEVER TAKE THIS AGAIN-PER MD'S.   ALL NARCOTICS- NAUSEA AND VOMITING   . Adhesive [Tape] Itching, Swelling, Rash and Other (See Comments)    Also pain at location  . Codeine Nausea And Vomiting  . Bactrim [Sulfamethoxazole-Trimethoprim]     Muscle aches  . Cephalexin Hives  . Elemental Sulfur Hives and Nausea Only  . Lisinopril Swelling  . Macrobid [Nitrofurantoin Monohyd Macro] Nausea And Vomiting  . Tamiflu [Oseltamivir Phosphate]     Past Medical History:  Diagnosis Date  . Anemia   . Angio-edema   . Arthritis   . Blood transfusion without reported diagnosis 1957  . CAD (coronary artery disease)  a. NSTEMI 2004 with thrombus in Cx and mod RCA. b. Nonischemic nuc 2008. c. mild NSTEMI 01/2013 (trop 0.42) -> cath with unchanged mod LAD & RCA disease, for continued medical therapy.  . Cancer ([place])   . Cataract   . Dementia ([place])    has improved-suspect b12 deficincy and concussion were to blame  . H/O Hashimoto thyroiditis   . Hyperlipidemia   . HYPERTENSION    a. Also has h/o intermittentl low BP.  Marland Kitchen Hypothyroidism   . Kidney stones   . Myocardial infarction ([place])   . Osteoporosis    no fractures  . Prediabetes   . QT prolongation    a. Prior EKGs QTc 474, but on 4/1 was 524 for unclear reason.  . Recurrent urinary tract infection   . Squamous cell carcinoma of skin 04/17/2015  . Urticaria     Past Surgical History:  Procedure Laterality Date  . ADENOIDECTOMY    . APPENDECTOMY  1999  . Bilateral Arthroscopies of knees    . CARDIAC CATHETERIZATION     Moderate 3 vessel CAD by cath 05/2012  . CHOLECYSTECTOMY  02/2010  . JOINT REPLACEMENT  01/2010   L TKR - allusio  . LEFT HEART CATHETERIZATION WITH CORONARY ANGIOGRAM N/A 05/22/2012   Procedure: LEFT HEART CATHETERIZATION WITH CORONARY ANGIOGRAM;  Surgeon: Sinclair Grooms, MD;  Location: [facility] CATH LAB;  Service: Cardiovascular;  Laterality: N/A;  . LEFT HEART  CATHETERIZATION WITH CORONARY ANGIOGRAM N/A 02/20/2014   Procedure: LEFT HEART CATHETERIZATION WITH CORONARY ANGIOGRAM;  Surgeon: Peter M Martinique, MD;  Location: [facility] CATH LAB;  Service: Cardiovascular;  Laterality: N/A;  . TONSILLECTOMY    . TUBAL LIGATION      Family History  Problem Relation Age of Onset  . Allergies Father   . Cancer Father   . Coronary artery disease Mother 9  . Heart disease Mother   . CAD Sister 17  . Cancer Sister   . AAA (abdominal aortic aneurysm) Sister   . Hypertension Sister   . Hyperlipidemia Sister   . Diabetes Sister   . Heart disease Sister   . Diabetes Maternal Aunt   . Allergic rhinitis Neg Hx   . Asthma Neg Hx   . Eczema Neg Hx   . Urticaria Neg Hx     Social History:  reports that she has never smoked. She has never used smokeless tobacco. She reports previous alcohol use. She reports that she does not use drugs.  REVIEW Of SYSTEMS:  History of CAD, followed regularly by cardiologist  Hypertension: Is on a 2 drug regimen, Followed by PCP and cardiologist   BP Readings from Last 3 Encounters:  02/05/21 118/72  12/03/20 120/78  11/07/20 120/70    History of angioedema: She feels that with modifying her diet and avoiding peppers and paprika she has not had any more episodes   Examination:   BP 118/72 (BP Location: Left Arm, Patient Position: Sitting, Cuff Size: Normal)   Pulse 75   Resp 18   Ht 5\' 6"  (1.676 m)   Wt 160 lb 3.2 oz (72.7 kg)   SpO2 98%   BMI 25.86 kg/m    There is a marked enlargement of the thyroid mostly in the midline and extending laterally This measures about 4 cm from top to bottom Most of the thyroid is slightly firm but has nodularity on the left lateral region  Lower neck circumference is about 41 centimeters  No lymphadenopathy in the neck  Assessments/Plan   Impaired fasting glucose:   She has had no progression of her prediabetes with A1c 5.8 Fasting glucose better at 100, previously as high  as 113 Encouraged her to continue healthy diet and low carbohydrate intake, to be as active as possible  We will continue to monitor annually  Multinodular goiter, long-standing, on exam stable with no symptoms of dysphagia or choking History of a 5.8 cm nodule on the right side on previous ultrasounds  She has had the goiter for about 40 years now and will continue to monitor clinically  LIPIDS: Well controlled with Crestor, followed by PCP  Mild hypothyroidism: Subjectively doing well TSH is consistently normal  She is taking brand name Synthroid 50 g, 8-1/2 tablets a week, to continue      Elayne Snare 02/05/2021, 8:59 AM

## 2021-02-16 ENCOUNTER — Ambulatory Visit (INDEPENDENT_AMBULATORY_CARE_PROVIDER_SITE_OTHER): Payer: Medicare Other

## 2021-02-16 ENCOUNTER — Other Ambulatory Visit: Payer: Self-pay

## 2021-02-16 DIAGNOSIS — E538 Deficiency of other specified B group vitamins: Secondary | ICD-10-CM

## 2021-03-16 ENCOUNTER — Other Ambulatory Visit: Payer: Self-pay | Admitting: Family Medicine

## 2021-03-17 ENCOUNTER — Other Ambulatory Visit: Payer: Self-pay

## 2021-03-17 ENCOUNTER — Ambulatory Visit (INDEPENDENT_AMBULATORY_CARE_PROVIDER_SITE_OTHER): Payer: Medicare Other | Admitting: Family Medicine

## 2021-03-17 DIAGNOSIS — E538 Deficiency of other specified B group vitamins: Secondary | ICD-10-CM

## 2021-03-17 NOTE — Progress Notes (Signed)
Pt received b12 in left deltoid, one day early than scheduled time

## 2021-03-30 ENCOUNTER — Other Ambulatory Visit (HOSPITAL_BASED_OUTPATIENT_CLINIC_OR_DEPARTMENT_OTHER): Payer: Self-pay

## 2021-03-30 ENCOUNTER — Other Ambulatory Visit: Payer: Self-pay

## 2021-03-30 ENCOUNTER — Ambulatory Visit: Payer: Medicare Other | Attending: Internal Medicine

## 2021-03-30 DIAGNOSIS — Z23 Encounter for immunization: Secondary | ICD-10-CM

## 2021-03-30 MED ORDER — PFIZER-BIONT COVID-19 VAC-TRIS 30 MCG/0.3ML IM SUSP
INTRAMUSCULAR | 0 refills | Status: DC
  Filled 2021-03-30: qty 0.3, 1d supply, fill #0

## 2021-03-30 NOTE — Progress Notes (Signed)
   Covid-19 Vaccination Clinic  Name:  Lindsey Erickson    MRN: [id number] DOB: Jan 26, 1932  03/30/2021  [name] was observed post Covid-19 immunization for 30 minutes based on pre-vaccination screening without incident. She was provided with Vaccine Information Sheet and instruction to access the V-Safe system.   [name] was instructed to call 911 with any severe reactions post vaccine: Marland Kitchen Difficulty breathing  . Swelling of face and throat  . A fast heartbeat  . A bad rash all over body  . Dizziness and weakness   Immunizations Administered    Name Date Dose VIS Date Route   PFIZER Comrnaty(Gray TOP) Covid-19 Vaccine 03/30/2021  9:40 AM 0.3 mL 10/30/2020 Intramuscular   Manufacturer: [employer], [employer]   Lot: [id number]   NDC: [phone]

## 2021-04-02 ENCOUNTER — Other Ambulatory Visit: Payer: Self-pay | Admitting: Endocrinology

## 2021-04-09 ENCOUNTER — Other Ambulatory Visit: Payer: Self-pay | Admitting: Cardiovascular Disease

## 2021-04-21 ENCOUNTER — Other Ambulatory Visit: Payer: Self-pay

## 2021-04-21 ENCOUNTER — Ambulatory Visit (INDEPENDENT_AMBULATORY_CARE_PROVIDER_SITE_OTHER): Payer: Medicare Other | Admitting: Family Medicine

## 2021-04-21 DIAGNOSIS — E538 Deficiency of other specified B group vitamins: Secondary | ICD-10-CM

## 2021-05-18 ENCOUNTER — Other Ambulatory Visit: Payer: Self-pay

## 2021-05-18 ENCOUNTER — Ambulatory Visit (INDEPENDENT_AMBULATORY_CARE_PROVIDER_SITE_OTHER): Payer: Medicare Other | Admitting: *Deleted

## 2021-05-18 DIAGNOSIS — E538 Deficiency of other specified B group vitamins: Secondary | ICD-10-CM

## 2021-05-18 DIAGNOSIS — E539 Vitamin B deficiency, unspecified: Secondary | ICD-10-CM

## 2021-05-18 NOTE — Progress Notes (Signed)
Patient seen in office for Vitamin B 12 injection.   Tolerated IM administration well.  

## 2021-06-17 ENCOUNTER — Other Ambulatory Visit: Payer: Self-pay

## 2021-06-17 ENCOUNTER — Ambulatory Visit (INDEPENDENT_AMBULATORY_CARE_PROVIDER_SITE_OTHER): Payer: Medicare Other | Admitting: *Deleted

## 2021-06-17 DIAGNOSIS — E538 Deficiency of other specified B group vitamins: Secondary | ICD-10-CM | POA: Diagnosis not present

## 2021-06-22 ENCOUNTER — Telehealth: Payer: Self-pay | Admitting: Family Medicine

## 2021-06-22 ENCOUNTER — Other Ambulatory Visit: Payer: Self-pay | Admitting: *Deleted

## 2021-06-22 DIAGNOSIS — Z136 Encounter for screening for cardiovascular disorders: Secondary | ICD-10-CM

## 2021-06-22 DIAGNOSIS — Z13220 Encounter for screening for lipoid disorders: Secondary | ICD-10-CM

## 2021-06-22 DIAGNOSIS — D619 Aplastic anemia, unspecified: Secondary | ICD-10-CM

## 2021-06-22 DIAGNOSIS — Z79899 Other long term (current) drug therapy: Secondary | ICD-10-CM

## 2021-06-22 DIAGNOSIS — E538 Deficiency of other specified B group vitamins: Secondary | ICD-10-CM

## 2021-06-22 DIAGNOSIS — E7800 Pure hypercholesterolemia, unspecified: Secondary | ICD-10-CM

## 2021-06-22 DIAGNOSIS — I10 Essential (primary) hypertension: Secondary | ICD-10-CM

## 2021-06-22 NOTE — Telephone Encounter (Signed)
Patient scheduled lab appt on 8/15 for cpe on 8/19. Patient wants B12 levels tested same day. Please advise at [phone].

## 2021-06-22 NOTE — Addendum Note (Signed)
Addended by: Sheral Flow on: 06/22/2021 03:30 PM   Modules accepted: Orders

## 2021-06-22 NOTE — Telephone Encounter (Signed)
Future lab orders placed

## 2021-07-01 ENCOUNTER — Other Ambulatory Visit: Payer: Self-pay

## 2021-07-01 ENCOUNTER — Other Ambulatory Visit: Payer: Self-pay | Admitting: *Deleted

## 2021-07-01 ENCOUNTER — Encounter: Payer: Self-pay | Admitting: Allergy

## 2021-07-01 ENCOUNTER — Ambulatory Visit (INDEPENDENT_AMBULATORY_CARE_PROVIDER_SITE_OTHER): Payer: Medicare Other | Admitting: Allergy

## 2021-07-01 VITALS — BP 130/72 | HR 78 | Temp 97.3°F | Wt 167.2 lb

## 2021-07-01 DIAGNOSIS — T783XXD Angioneurotic edema, subsequent encounter: Secondary | ICD-10-CM | POA: Diagnosis not present

## 2021-07-01 MED ORDER — FEXOFENADINE HCL 180 MG PO TABS: 180.0000 mg | ORAL_TABLET | Freq: Two times a day (BID) | ORAL | 5 refills | Status: DC

## 2021-07-01 MED ORDER — EPINEPHRINE 0.3 MG/0.3ML IJ SOAJ: INTRAMUSCULAR | 5 refills | Status: DC

## 2021-07-01 NOTE — Patient Instructions (Signed)
Allergic Angioedema (lip swelling)     - episodic angioedema can be due to variety of different triggers.  ACE-I (lisinopril) is a known medication that can cause angioedema of the oral cavity.   The swelling can continue for months even despite stopping the medication.  However you have been off lisinopril for a long-time at this point.  This category of medications should not be taken again in the future.      - Labs for swelling were largely unremarkable except for sensitivity to elm tree pollen and Paprika.     - avoid foods that contain Paprika in ingredients list.     - have not had any lip swelling since avoiding Paprika!   - continue current regimen with allegra and pepcid for flare-ups as needed   - reserve prednisone for more severe swelling events.  If lip swelling take 2 prednisone tablets ('20mg'$  total) per day if needed   - have access to epipen device in case of allergic reaction and follow emergency action plan   Follow-up in 12 months or sooner if needed

## 2021-07-01 NOTE — Progress Notes (Signed)
Follow-up Note  RE: Lindsey Erickson MRN: [id number] DOB: 1932/09/17 Date of Office Visit: 07/01/2021   History of present illness: Lindsey Erickson is a 87 y.o. female presenting today for follow-up of angioedema.  She was last seen in the office on 12/03/20 by myself.  She has been going great since her last visit.  She has not had any lip swelling episodes in about 18 months now!  Thus she has not needed allegra, pepcid or any prednisone use.  Now that she knows what foods to avoid she is doing much better.  She is cooking more.  States she doesn't eat out at restaurants.  Her allegra has expired.  She still has pepcid at home.  Her epinephrine device also has expired.     Review of systems: Review of Systems  Constitutional: Negative.   HENT: Negative.    Eyes: Negative.   Respiratory: Negative.    Cardiovascular: Negative.   Gastrointestinal: Negative.   Musculoskeletal: Negative.   Skin: Negative.   Neurological: Negative.    All other systems negative unless noted above in HPI  Past medical/social/surgical/family history have been reviewed and are unchanged unless specifically indicated below.  No changes  Medication List: Current Outpatient Medications  Medication Sig Dispense Refill   acetaminophen (TYLENOL) 500 MG tablet Take 500 mg by mouth every 6 (six) hours as needed for moderate pain.     carvedilol (COREG CR) 40 MG 24 hr capsule TAKE 1 CAPSULE EVERY EVENING 90 capsule 3   clopidogrel (PLAVIX) 75 MG tablet TAKE 1 TABLET DAILY 90 tablet 3   conjugated estrogens (PREMARIN) vaginal cream Place 1 Applicatorful vaginally 2 (two) times a week. Tuesday and Saturday.     COVID-19 mRNA Vac-TriS, Pfizer, (PFIZER-BIONT COVID-19 VAC-TRIS) SUSP injection Inject into the muscle. 0.3 mL 0   Cyanocobalamin 2000 MCG/ML SOLN Inject as directed.     EPINEPHrine 0.3 mg/0.3 mL IJ SOAJ injection SMARTSIG:0.3 Milliliter(s) IM Once PRN     famotidine (PEPCID) 20 MG tablet Take 1 tablet  (20 mg total) by mouth 2 (two) times daily. 180 tablet 1   fexofenadine (ALLEGRA) 180 MG tablet TAKE 1 TABLET TWICE A DAY 180 tablet 3   hydrochlorothiazide (MICROZIDE) 12.5 MG capsule TAKE 1 CAPSULE DAILY 90 capsule 0   isosorbide mononitrate (IMDUR) 60 MG 24 hr tablet TAKE 1 TABLET DAILY 90 tablet 3   Multiple Vitamins-Minerals (CENTRUM SILVER 50+WOMEN PO) Take 1 tablet by mouth daily.     nitroGLYCERIN (NITROSTAT) 0.4 MG SL tablet DISSOLVE 1 TABLET UNDER THE TONGUE EVERY 5 MINUTES AS NEEDED FOR CHEST PAIN 75 tablet 3   OVER THE COUNTER MEDICATION Take 1 capsule by mouth daily. Cranberry D - Mannose     PEPCID 20 MG tablet Take 20 mg by mouth as needed (angioedema).      predniSONE (DELTASONE) 20 MG tablet Take 20 mg by mouth as needed (angioedema).     rosuvastatin (CRESTOR) 20 MG tablet TAKE 1 TABLET AT BEDTIME 90 tablet 3   SYNTHROID 50 MCG tablet TAKE 1 TABLET ALTERNATING WITH ONE AND ONE-HALF TABLETS AS DIRECTED 108 tablet 3   trimethoprim (TRIMPEX) 100 MG tablet Take 100 mg by mouth daily.     donepezil (ARICEPT) 5 MG tablet Take by mouth.     Current Facility-Administered Medications  Medication Dose Route Frequency Provider Last Rate Last Admin   cyanocobalamin ((VITAMIN B-12)) injection 1,000 mcg  1,000 mcg Intramuscular Q30 days Susy Frizzle, MD  1,000 mcg at 06/17/21 0855     Known medication allergies: Allergies  Allergen Reactions   Ciprofloxacin Hives   Other Other (See Comments)    Frozen Blood Plasma-caused patient to crash / cardiac arrest / CAN NEVER TAKE THIS AGAIN-PER MD'S.   ALL NARCOTICS- NAUSEA AND VOMITING    Adhesive [Tape] Itching, Swelling, Rash and Other (See Comments)    Also pain at location   Codeine Nausea And Vomiting   Bactrim [Sulfamethoxazole-Trimethoprim]     Muscle aches   Cephalexin Hives   Elemental Sulfur Hives and Nausea Only   Lisinopril Swelling   Macrobid [Nitrofurantoin Monohyd Macro] Nausea And Vomiting   Tamiflu [Oseltamivir  Phosphate]      Physical examination: Blood pressure 130/72, pulse 78, temperature (!) 97.3 F (36.3 C), temperature source Temporal, weight 167 lb 3.2 oz (75.8 kg), SpO2 98 %.  General: Alert, interactive, in no acute distress. HEENT: PERRLA, TMs pearly gray, turbinates non-edematous without discharge, post-pharynx non erythematous. Neck: Supple without lymphadenopathy. Lungs: Clear to auscultation without wheezing, rhonchi or rales. {no increased work of breathing. CV: Normal S1, S2 without murmurs. Abdomen: Nondistended, nontender. Skin: Warm and dry, without lesions or rashes. Extremities:  No clubbing, cyanosis or edema. Neuro:   Grossly intact.  Diagnositics/Labs: None today  Assessment and plan:   Allergic Angioedema (lip swelling)     - episodic angioedema can be due to variety of different triggers.  ACE-I (lisinopril) is a known medication that can cause angioedema of the oral cavity.   The swelling can continue for months even despite stopping the medication.  However you have been off lisinopril for a long-time at this point.  This category of medications should not be taken again in the future.      - Labs for swelling were largely unremarkable except for sensitivity to elm tree pollen and Paprika.     - avoid foods that contain Paprika in ingredients list.     - have not had any lip swelling since avoiding Paprika!   - continue current regimen with allegra and pepcid for flare-ups as needed   - reserve prednisone for more severe swelling events.  If lip swelling take 2 prednisone tablets ('20mg'$  total) per day if needed   - have access to epipen device in case of allergic reaction and follow emergency action plan   Follow-up in 12 months or sooner if needed     I appreciate the opportunity to take part in [place] care. Please do not hesitate to contact me with questions.  Sincerely,   Prudy Feeler, MD Allergy/Immunology [facility]

## 2021-07-06 ENCOUNTER — Other Ambulatory Visit: Payer: Medicare Other

## 2021-07-08 ENCOUNTER — Other Ambulatory Visit: Payer: Self-pay | Admitting: Cardiovascular Disease

## 2021-07-08 ENCOUNTER — Other Ambulatory Visit: Payer: Self-pay

## 2021-07-08 ENCOUNTER — Other Ambulatory Visit: Payer: Medicare Other

## 2021-07-08 DIAGNOSIS — E7800 Pure hypercholesterolemia, unspecified: Secondary | ICD-10-CM

## 2021-07-08 DIAGNOSIS — Z79899 Other long term (current) drug therapy: Secondary | ICD-10-CM

## 2021-07-08 DIAGNOSIS — Z13220 Encounter for screening for lipoid disorders: Secondary | ICD-10-CM

## 2021-07-08 DIAGNOSIS — E538 Deficiency of other specified B group vitamins: Secondary | ICD-10-CM

## 2021-07-08 DIAGNOSIS — D619 Aplastic anemia, unspecified: Secondary | ICD-10-CM

## 2021-07-08 DIAGNOSIS — Z136 Encounter for screening for cardiovascular disorders: Secondary | ICD-10-CM

## 2021-07-08 DIAGNOSIS — I10 Essential (primary) hypertension: Secondary | ICD-10-CM

## 2021-07-08 NOTE — Telephone Encounter (Signed)
Rx(s) sent to pharmacy electronically.  

## 2021-07-09 LAB — CBC WITH DIFFERENTIAL/PLATELET
Absolute Monocytes: 564 cells/uL (ref 200–950)
Basophils Absolute: 68 cells/uL (ref 0–200)
Basophils Relative: 1.2 %
Eosinophils Absolute: 211 cells/uL (ref 15–500)
Eosinophils Relative: 3.7 %
HCT: 42.1 % (ref 35.0–45.0)
Hemoglobin: 13.8 g/dL (ref 11.7–15.5)
Lymphs Abs: 1790 cells/uL (ref 850–3900)
MCH: 32.5 pg (ref 27.0–33.0)
MCHC: 32.8 g/dL (ref 32.0–36.0)
MCV: 99.3 fL (ref 80.0–100.0)
MPV: 9.9 fL (ref 7.5–12.5)
Monocytes Relative: 9.9 %
Neutro Abs: 3067 cells/uL (ref 1500–7800)
Neutrophils Relative %: 53.8 %
Platelets: 213 10*3/uL (ref 140–400)
RBC: 4.24 10*6/uL (ref 3.80–5.10)
RDW: 12 % (ref 11.0–15.0)
Total Lymphocyte: 31.4 %
WBC: 5.7 10*3/uL (ref 3.8–10.8)

## 2021-07-09 LAB — LIPID PANEL
Cholesterol: 174 mg/dL (ref <200)
HDL: 67 mg/dL (ref >=50)
LDL Cholesterol (Calc): 80 mg/dL (calc)
Non-HDL Cholesterol (Calc): 107 mg/dL (calc) (ref <130)
Total CHOL/HDL Ratio: 2.6 (calc) (ref <5.0)
Triglycerides: 177 mg/dL — ABNORMAL HIGH (ref <150)

## 2021-07-09 LAB — COMPLETE METABOLIC PANEL WITH GFR
AG Ratio: 1.5 (calc) (ref 1.0–2.5)
ALT: 13 U/L (ref 6–29)
AST: 17 U/L (ref 10–35)
Albumin: 4 g/dL (ref 3.6–5.1)
Alkaline phosphatase (APISO): 62 U/L (ref 37–153)
BUN: 20 mg/dL (ref 7–25)
CO2: 28 mmol/L (ref 20–32)
Calcium: 9.1 mg/dL (ref 8.6–10.4)
Chloride: 104 mmol/L (ref 98–110)
Creat: 0.95 mg/dL (ref 0.60–0.95)
Globulin: 2.6 g/dL (calc) (ref 1.9–3.7)
Glucose, Bld: 110 mg/dL — ABNORMAL HIGH (ref 65–99)
Potassium: 4.5 mmol/L (ref 3.5–5.3)
Sodium: 141 mmol/L (ref 135–146)
Total Bilirubin: 0.4 mg/dL (ref 0.2–1.2)
Total Protein: 6.6 g/dL (ref 6.1–8.1)
eGFR: 58 mL/min/{1.73_m2} — ABNORMAL LOW (ref >=60)

## 2021-07-09 LAB — VITAMIN B12: Vitamin B-12: 401 pg/mL (ref 200–1100)

## 2021-07-10 ENCOUNTER — Encounter: Payer: Self-pay | Admitting: Family Medicine

## 2021-07-10 ENCOUNTER — Ambulatory Visit (INDEPENDENT_AMBULATORY_CARE_PROVIDER_SITE_OTHER): Payer: Medicare Other | Admitting: Family Medicine

## 2021-07-10 ENCOUNTER — Other Ambulatory Visit: Payer: Self-pay

## 2021-07-10 VITALS — BP 110/68 | HR 58 | Temp 98.7°F | Resp 14 | Ht 66.0 in | Wt 166.0 lb

## 2021-07-10 DIAGNOSIS — Z0000 Encounter for general adult medical examination without abnormal findings: Secondary | ICD-10-CM

## 2021-07-10 DIAGNOSIS — E538 Deficiency of other specified B group vitamins: Secondary | ICD-10-CM

## 2021-07-10 DIAGNOSIS — I10 Essential (primary) hypertension: Secondary | ICD-10-CM

## 2021-07-10 DIAGNOSIS — Z0001 Encounter for general adult medical examination with abnormal findings: Secondary | ICD-10-CM

## 2021-07-10 DIAGNOSIS — I2510 Atherosclerotic heart disease of native coronary artery without angina pectoris: Secondary | ICD-10-CM | POA: Diagnosis not present

## 2021-07-10 DIAGNOSIS — E7800 Pure hypercholesterolemia, unspecified: Secondary | ICD-10-CM

## 2021-07-10 DIAGNOSIS — E042 Nontoxic multinodular goiter: Secondary | ICD-10-CM | POA: Diagnosis not present

## 2021-07-10 NOTE — Progress Notes (Signed)
Subjective:    Patient ID: Lindsey Erickson, female    DOB: 02-Dec-1931, 87 y.o.   MRN: [id number]  HPI   Patient is a very pleasant 87 year old white female who is here for CPE. She has a significant past medical history of coronary artery disease. According to her report she is suffered 2 separate myocardial infarctions. However on repeat catheterizations, the patient states that she has lesions which are not amenable to stenting. Therefore she is being managed medically. She is currently on a long-acting nitroglycerin, beta blocker, and ACE inhibitor, statin medication, and dual antiplatelet therapy. She follows up regularly with her cardiologist.  She is currently on Synthroid to help suppress the growth of her goiter.  Patient has elected not to pursue mammograms further.  Her last bone density test was 2018 and was excellent.  Therefore we have elected to defer this for 5 years until 2023.  Due to her age she does not require a Pap smear or colonoscopy.  Her immunizations are listed below.  She is due for Shingrix as well as a flu shot this fall.  Otherwise her immunizations are up-to-date.  She feels that B12 injections have helped her memory.  She is getting them monthly.  Her most recent B12 level was over 400 on monthly parenteral B12 injections.  Overall she is doing well.  She sees endocrinology regarding the management of her goiter.  It is quite large on exam but she denies any dysphagia or trouble swallowing or trouble breathing.  She denies any falls or depression or memory loss Past Medical History:  Diagnosis Date   Anemia    Angio-edema    Arthritis    Blood transfusion without reported diagnosis 1957   CAD (coronary artery disease)    a. NSTEMI 2004 with thrombus in Cx and mod RCA. b. Nonischemic nuc 2008. c. mild NSTEMI 01/2013 (trop 0.42) -> cath with unchanged mod LAD & RCA disease, for continued medical therapy.   Cancer ([place])    Cataract    Dementia ([place])    has  improved-suspect b12 deficincy and concussion were to blame   H/O Hashimoto thyroiditis    Hyperlipidemia    HYPERTENSION    a. Also has h/o intermittentl low BP.   Hypothyroidism    Kidney stones    Myocardial infarction [facility])    Osteoporosis    no fractures   Prediabetes    QT prolongation    a. Prior EKGs QTc 474, but on 4/1 was 524 for unclear reason.   Recurrent urinary tract infection    Squamous cell carcinoma of skin 04/17/2015   Urticaria    Past Surgical History:  Procedure Laterality Date   ADENOIDECTOMY     APPENDECTOMY  1999   Bilateral Arthroscopies of knees     CARDIAC CATHETERIZATION     Moderate 3 vessel CAD by cath 05/2012   CHOLECYSTECTOMY  02/2010   JOINT REPLACEMENT  01/2010   L TKR - allusio   LEFT HEART CATHETERIZATION WITH CORONARY ANGIOGRAM N/A 05/22/2012   Procedure: LEFT HEART CATHETERIZATION WITH CORONARY ANGIOGRAM;  Surgeon: Sinclair Grooms, MD;  Location: [facility] CATH LAB;  Service: Cardiovascular;  Laterality: N/A;   LEFT HEART CATHETERIZATION WITH CORONARY ANGIOGRAM N/A 02/20/2014   Procedure: LEFT HEART CATHETERIZATION WITH CORONARY ANGIOGRAM;  Surgeon: Peter M Martinique, MD;  Location: [facility] CATH LAB;  Service: Cardiovascular;  Laterality: N/A;   TONSILLECTOMY     TUBAL LIGATION     Current Outpatient  Medications on File Prior to Visit  Medication Sig Dispense Refill   acetaminophen (TYLENOL) 500 MG tablet Take 500 mg by mouth every 6 (six) hours as needed for moderate pain.     carvedilol (COREG CR) 40 MG 24 hr capsule TAKE 1 CAPSULE EVERY EVENING 90 capsule 3   clopidogrel (PLAVIX) 75 MG tablet TAKE 1 TABLET DAILY 90 tablet 3   conjugated estrogens (PREMARIN) vaginal cream Place 1 Applicatorful vaginally 2 (two) times a week. Tuesday and Saturday.     Cyanocobalamin 2000 MCG/ML SOLN Inject as directed.     EPINEPHrine 0.3 mg/0.3 mL IJ SOAJ injection SMARTSIG:0.3 Milliliter(s) IM Once PRN 1 each 5   famotidine (PEPCID) 20 MG tablet Take 1 tablet (20 mg  total) by mouth 2 (two) times daily. 180 tablet 1   fexofenadine (ALLEGRA) 180 MG tablet Take 1 tablet (180 mg total) by mouth 2 (two) times daily. 180 tablet 5   hydrochlorothiazide (MICROZIDE) 12.5 MG capsule Take 1 capsule (12.5 mg total) by mouth daily. Need appointment 90 capsule 0   isosorbide mononitrate (IMDUR) 60 MG 24 hr tablet TAKE 1 TABLET DAILY 90 tablet 3   Multiple Vitamins-Minerals (CENTRUM SILVER 50+WOMEN PO) Take 1 tablet by mouth daily.     nitroGLYCERIN (NITROSTAT) 0.4 MG SL tablet DISSOLVE 1 TABLET UNDER THE TONGUE EVERY 5 MINUTES AS NEEDED FOR CHEST PAIN 75 tablet 3   OVER THE COUNTER MEDICATION Take 1 capsule by mouth daily. Cranberry D - [place]     rosuvastatin (CRESTOR) 20 MG tablet TAKE 1 TABLET AT BEDTIME 90 tablet 3   SYNTHROID 50 MCG tablet TAKE 1 TABLET ALTERNATING WITH ONE AND ONE-HALF TABLETS AS DIRECTED 108 tablet 3   trimethoprim (TRIMPEX) 100 MG tablet Take 100 mg by mouth daily.     Current Facility-Administered Medications on File Prior to Visit  Medication Dose Route Frequency Provider Last Rate Last Admin   cyanocobalamin ((VITAMIN B-12)) injection 1,000 mcg  1,000 mcg Intramuscular Q30 days Susy Frizzle, MD   1,000 mcg at 06/17/21 2707   Allergies  Allergen Reactions   Ciprofloxacin Hives   Other Other (See Comments)    Frozen Blood Plasma-caused patient to crash / cardiac arrest / CAN NEVER TAKE THIS AGAIN-PER MD'S.   ALL NARCOTICS- NAUSEA AND VOMITING    Adhesive [Tape] Itching, Swelling, Rash and Other (See Comments)    Also pain at location   Codeine Nausea And Vomiting   Bactrim [Sulfamethoxazole-Trimethoprim]     Muscle aches   Cephalexin Hives   Elemental Sulfur Hives and Nausea Only   Lisinopril Swelling   Macrobid [Nitrofurantoin Monohyd Macro] Nausea And Vomiting   Tamiflu [Oseltamivir Phosphate]    Social History   Socioeconomic History   Marital status: Married    Spouse name: Not on file   Number of children: Not on file    Years of education: Not on file   Highest education level: Not on file  Occupational History   Not on file  Tobacco Use   Smoking status: Never   Smokeless tobacco: Never  Vaping Use   Vaping Use: Never used  Substance and Sexual Activity   Alcohol use: Not Currently   Drug use: Never   Sexual activity: Not Currently  Other Topics Concern   Not on file  Social History Narrative   ** Merged History Encounter **       Married, 2 children. Retired from [employer]  Financial Resource Strain: Not on file  Food Insecurity: Not on file  Transportation Needs: Not on file  Physical Activity: Not on file  Stress: Not on file  Social Connections: Not on file  Intimate Partner Violence: Not on file   Family History  Problem Relation Age of Onset   Allergies Father    Cancer Father    Coronary artery disease Mother 71   Heart disease Mother    CAD Sister 35   Cancer Sister    AAA (abdominal aortic aneurysm) Sister    Hypertension Sister    Hyperlipidemia Sister    Diabetes Sister    Heart disease Sister    Diabetes Maternal Aunt    Allergic rhinitis Neg Hx    Asthma Neg Hx    Eczema Neg Hx    Urticaria Neg Hx       Review of Systems  All other systems reviewed and are negative.     Objective:   Physical Exam Vitals reviewed.  Constitutional:      General: She is not in acute distress.    Appearance: She is well-developed. She is not diaphoretic.  Neck:     Thyroid: Thyromegaly present.     Vascular: No JVD.     Trachea: No tracheal deviation.  Cardiovascular:     Rate and Rhythm: Normal rate and regular rhythm.     Heart sounds: Normal heart sounds. No murmur heard. Pulmonary:     Effort: Pulmonary effort is normal. No respiratory distress.     Breath sounds: Normal breath sounds. No wheezing or rales.  Abdominal:     General: Bowel sounds are normal. There is no distension.     Palpations: Abdomen is soft.      Tenderness: There is no abdominal tenderness. There is no guarding or rebound.  Musculoskeletal:     Cervical back: Neck supple.  Lymphadenopathy:     Cervical: No cervical adenopathy.  Neurological:     Mental Status: She is alert and oriented to person, place, and time.     Cranial Nerves: No cranial nerve deficit.     Motor: No abnormal muscle tone.     Coordination: Coordination normal.     Deep Tendon Reflexes: Reflexes are normal and symmetric.         Assessment & Plan:  General medical exam  Coronary artery disease involving native coronary artery of native heart without angina pectoris  Multinodular goiter  Pure hypercholesterolemia  Essential hypertension  B12 deficiency Physical exam today is significant only for the goiter.  Her blood pressure is excellent.  I recommended a flu shot this fall along with Shingrix.  Her prediabetes is worse and so I recommended that we repeat lab work in 6 months.  Her LDL cholesterol is mildly elevated in the 80s although she does have a good HDL cholesterol greater than 60.  However I like to see her LDL cholesterol below 70.  She does not want to take a higher dose of Crestor due to muscle aches.  We discussed Vascepa.  I would recommend at least trying fish oil 2000 mg daily and then rechecking cholesterol in 6 months.  She declines any further cancer screening.  She denies any falls or depression.  She states that her memory loss is improved dramatically on parenteral B12 and her B12 level was back into a normal range on the lab work. Appointment on 07/08/2021  Component Date Value Ref Range Status   Vitamin B-12 07/08/2021  401  200 - 1,100 pg/mL Final   Cholesterol 07/08/2021 174  <200 mg/dL Final   HDL 07/08/2021 67  > OR = 50 mg/dL Final   Triglycerides 07/08/2021 177 (A) <150 mg/dL Final   LDL Cholesterol (Calc) 07/08/2021 80  mg/dL (calc) Final   Comment: Reference range: <100 . Desirable range <100 mg/dL for primary  prevention;   <70 mg/dL for patients with CHD or diabetic patients  with > or = 2 CHD risk factors. Marland Kitchen LDL-C is now calculated using the Martin-Hopkins  calculation, which is a validated novel method providing  better accuracy than the Friedewald equation in the  estimation of LDL-C.  Cresenciano Genre et al. Annamaria Helling. 1638;453(64): 2061-2068  (http://education.[facility].com/faq/FAQ164)    Total CHOL/HDL Ratio 07/08/2021 2.6  <5.0 (calc) Final   Non-HDL Cholesterol (Calc) 07/08/2021 107  <130 mg/dL (calc) Final   Comment: For patients with diabetes plus 1 major ASCVD risk  factor, treating to a non-HDL-C goal of <100 mg/dL  (LDL-C of <70 mg/dL) is considered a therapeutic  option.    Glucose, Bld 07/08/2021 110 (A) 65 - 99 mg/dL Final   Comment: .            Fasting reference interval . For someone without known diabetes, a glucose value between 100 and 125 mg/dL is consistent with prediabetes and should be confirmed with a follow-up test. .    BUN 07/08/2021 20  7 - 25 mg/dL Final   Creat 07/08/2021 0.95  0.60 - 0.95 mg/dL Final   eGFR 07/08/2021 58 (A) > OR = 60 mL/min/1.29m Final   Comment: The eGFR is based on the CKD-EPI 2021 equation. To calculate  the new eGFR from a previous Creatinine or Cystatin C result, go to https://www.kidney.org/professionals/ kdoqi/gfr%5Fcalculator    BUN/Creatinine Ratio 068/03/2122NOT APPLICABLE  6 - 22 (calc) Final   Sodium 07/08/2021 141  135 - 146 mmol/L Final   Potassium 07/08/2021 4.5  3.5 - 5.3 mmol/L Final   Chloride 07/08/2021 104  98 - 110 mmol/L Final   CO2 07/08/2021 28  20 - 32 mmol/L Final   Calcium 07/08/2021 9.1  8.6 - 10.4 mg/dL Final   Total Protein 07/08/2021 6.6  6.1 - 8.1 g/dL Final   Albumin 07/08/2021 4.0  3.6 - 5.1 g/dL Final   Globulin 07/08/2021 2.6  1.9 - 3.7 g/dL (calc) Final   AG Ratio 07/08/2021 1.5  1.0 - 2.5 (calc) Final   Total Bilirubin 07/08/2021 0.4  0.2 - 1.2 mg/dL Final   Alkaline phosphatase (APISO)  07/08/2021 62  37 - 153 U/L Final   AST 07/08/2021 17  10 - 35 U/L Final   ALT 07/08/2021 13  6 - 29 U/L Final   WBC 07/08/2021 5.7  3.8 - 10.8 Thousand/uL Final   RBC 07/08/2021 4.24  3.80 - 5.10 Million/uL Final   Hemoglobin 07/08/2021 13.8  11.7 - 15.5 g/dL Final   HCT 07/08/2021 42.1  35.0 - 45.0 % Final   MCV 07/08/2021 99.3  80.0 - 100.0 fL Final   MCH 07/08/2021 32.5  27.0 - 33.0 pg Final   MCHC 07/08/2021 32.8  32.0 - 36.0 g/dL Final   RDW 07/08/2021 12.0  11.0 - 15.0 % Final   Platelets 07/08/2021 213  140 - 400 Thousand/uL Final   MPV 07/08/2021 9.9  7.5 - 12.5 fL Final   Neutro Abs 07/08/2021 3,067  1,500 - 7,800 cells/uL Final   Lymphs Abs 07/08/2021 1,790  850 - 3,900 cells/uL  Final   Absolute Monocytes 07/08/2021 564  200 - 950 cells/uL Final   Eosinophils Absolute 07/08/2021 211  15 - 500 cells/uL Final   Basophils Absolute 07/08/2021 68  0 - 200 cells/uL Final   Neutrophils Relative % 07/08/2021 53.8  % Final   Total Lymphocyte 07/08/2021 31.4  % Final   Monocytes Relative 07/08/2021 9.9  % Final   Eosinophils Relative 07/08/2021 3.7  % Final   Basophils Relative 07/08/2021 1.2  % Final

## 2021-07-21 ENCOUNTER — Ambulatory Visit (INDEPENDENT_AMBULATORY_CARE_PROVIDER_SITE_OTHER): Payer: Medicare Other | Admitting: *Deleted

## 2021-07-21 ENCOUNTER — Other Ambulatory Visit: Payer: Self-pay

## 2021-07-21 DIAGNOSIS — E538 Deficiency of other specified B group vitamins: Secondary | ICD-10-CM | POA: Diagnosis not present

## 2021-08-17 ENCOUNTER — Other Ambulatory Visit: Payer: Self-pay

## 2021-08-17 ENCOUNTER — Ambulatory Visit (INDEPENDENT_AMBULATORY_CARE_PROVIDER_SITE_OTHER): Payer: Medicare Other | Admitting: *Deleted

## 2021-08-17 DIAGNOSIS — E538 Deficiency of other specified B group vitamins: Secondary | ICD-10-CM | POA: Diagnosis not present

## 2021-08-17 DIAGNOSIS — Z23 Encounter for immunization: Secondary | ICD-10-CM | POA: Diagnosis not present

## 2021-08-17 DIAGNOSIS — E539 Vitamin B deficiency, unspecified: Secondary | ICD-10-CM | POA: Diagnosis not present

## 2021-08-20 ENCOUNTER — Other Ambulatory Visit: Payer: Self-pay | Admitting: Family Medicine

## 2021-09-16 ENCOUNTER — Other Ambulatory Visit: Payer: Self-pay

## 2021-09-16 ENCOUNTER — Ambulatory Visit (INDEPENDENT_AMBULATORY_CARE_PROVIDER_SITE_OTHER): Payer: Medicare Other | Admitting: *Deleted

## 2021-09-16 DIAGNOSIS — E539 Vitamin B deficiency, unspecified: Secondary | ICD-10-CM | POA: Diagnosis not present

## 2021-09-16 DIAGNOSIS — E538 Deficiency of other specified B group vitamins: Secondary | ICD-10-CM

## 2021-10-06 ENCOUNTER — Other Ambulatory Visit: Payer: Self-pay | Admitting: Cardiovascular Disease

## 2021-10-09 ENCOUNTER — Other Ambulatory Visit: Payer: Self-pay | Admitting: Family Medicine

## 2021-10-19 ENCOUNTER — Ambulatory Visit: Payer: Medicare Other

## 2021-10-19 ENCOUNTER — Ambulatory Visit (INDEPENDENT_AMBULATORY_CARE_PROVIDER_SITE_OTHER): Payer: Medicare Other | Admitting: Nurse Practitioner

## 2021-10-19 ENCOUNTER — Other Ambulatory Visit: Payer: Self-pay

## 2021-10-19 DIAGNOSIS — E539 Vitamin B deficiency, unspecified: Secondary | ICD-10-CM

## 2021-10-19 DIAGNOSIS — E538 Deficiency of other specified B group vitamins: Secondary | ICD-10-CM

## 2021-10-20 ENCOUNTER — Telehealth: Payer: Self-pay | Admitting: Family Medicine

## 2021-10-20 NOTE — Telephone Encounter (Signed)
Patient called because B12 received 11/28 was originally scheduled as an office visit. Appt changed to nurse visit.  Patient also stated she usually sees name of nurse who gave injection, type of injection received, and date next one due; doesn't see any of that information through [place].   Please advise at [phone].

## 2021-10-23 NOTE — Telephone Encounter (Signed)
Mailed patient information about b12 given and next due date

## 2021-10-23 NOTE — Progress Notes (Signed)
Patient presented to office for vitamin b12 injection

## 2021-11-17 ENCOUNTER — Ambulatory Visit (INDEPENDENT_AMBULATORY_CARE_PROVIDER_SITE_OTHER): Payer: Medicare Other

## 2021-11-17 ENCOUNTER — Other Ambulatory Visit: Payer: Self-pay

## 2021-11-17 DIAGNOSIS — E538 Deficiency of other specified B group vitamins: Secondary | ICD-10-CM

## 2021-11-17 DIAGNOSIS — E539 Vitamin B deficiency, unspecified: Secondary | ICD-10-CM | POA: Diagnosis not present

## 2021-11-17 NOTE — Progress Notes (Signed)
Vitamin B12 1000 mch/mL IM injection given today, patient tolerated well. ° °

## 2021-12-21 ENCOUNTER — Ambulatory Visit (INDEPENDENT_AMBULATORY_CARE_PROVIDER_SITE_OTHER): Payer: Medicare Other

## 2021-12-21 ENCOUNTER — Other Ambulatory Visit: Payer: Self-pay

## 2021-12-21 DIAGNOSIS — E538 Deficiency of other specified B group vitamins: Secondary | ICD-10-CM

## 2021-12-21 NOTE — Progress Notes (Signed)
Vitamin B12 1000 mch/mL IM injection given today, patient tolerated well. ° °

## 2022-01-11 ENCOUNTER — Other Ambulatory Visit: Payer: Medicare Other

## 2022-01-11 ENCOUNTER — Other Ambulatory Visit: Payer: Self-pay

## 2022-01-11 DIAGNOSIS — E7800 Pure hypercholesterolemia, unspecified: Secondary | ICD-10-CM

## 2022-01-11 DIAGNOSIS — I10 Essential (primary) hypertension: Secondary | ICD-10-CM

## 2022-01-11 DIAGNOSIS — R7303 Prediabetes: Secondary | ICD-10-CM

## 2022-01-11 DIAGNOSIS — E538 Deficiency of other specified B group vitamins: Secondary | ICD-10-CM

## 2022-01-11 LAB — LIPID PANEL
Cholesterol: 172 mg/dL (ref <200)
HDL: 71 mg/dL (ref >=50)
LDL Cholesterol (Calc): 81 mg/dL (calc)
Non-HDL Cholesterol (Calc): 101 mg/dL (calc) (ref <130)
Total CHOL/HDL Ratio: 2.4 (calc) (ref <5.0)
Triglycerides: 107 mg/dL (ref <150)

## 2022-01-11 LAB — CBC WITH DIFFERENTIAL/PLATELET
Absolute Monocytes: 612 cells/uL (ref 200–950)
Basophils Absolute: 48 cells/uL (ref 0–200)
Basophils Relative: 0.8 %
Eosinophils Absolute: 210 cells/uL (ref 15–500)
Eosinophils Relative: 3.5 %
HCT: 41.1 % (ref 35.0–45.0)
Hemoglobin: 13.5 g/dL (ref 11.7–15.5)
Lymphs Abs: 1668 cells/uL (ref 850–3900)
MCH: 32.5 pg (ref 27.0–33.0)
MCHC: 32.8 g/dL (ref 32.0–36.0)
MCV: 99 fL (ref 80.0–100.0)
MPV: 9.5 fL (ref 7.5–12.5)
Monocytes Relative: 10.2 %
Neutro Abs: 3462 cells/uL (ref 1500–7800)
Neutrophils Relative %: 57.7 %
Platelets: 222 10*3/uL (ref 140–400)
RBC: 4.15 10*6/uL (ref 3.80–5.10)
RDW: 12.2 % (ref 11.0–15.0)
Total Lymphocyte: 27.8 %
WBC: 6 10*3/uL (ref 3.8–10.8)

## 2022-01-11 LAB — COMPREHENSIVE METABOLIC PANEL
AG Ratio: 1.4 (calc) (ref 1.0–2.5)
ALT: 15 U/L (ref 6–29)
AST: 18 U/L (ref 10–35)
Albumin: 4 g/dL (ref 3.6–5.1)
Alkaline phosphatase (APISO): 59 U/L (ref 37–153)
BUN: 18 mg/dL (ref 7–25)
CO2: 30 mmol/L (ref 20–32)
Calcium: 9.5 mg/dL (ref 8.6–10.4)
Chloride: 101 mmol/L (ref 98–110)
Creat: 0.87 mg/dL (ref 0.60–0.95)
Globulin: 2.8 g/dL (calc) (ref 1.9–3.7)
Glucose, Bld: 108 mg/dL — ABNORMAL HIGH (ref 65–99)
Potassium: 4.7 mmol/L (ref 3.5–5.3)
Sodium: 138 mmol/L (ref 135–146)
Total Bilirubin: 0.5 mg/dL (ref 0.2–1.2)
Total Protein: 6.8 g/dL (ref 6.1–8.1)

## 2022-01-11 LAB — VITAMIN B12: Vitamin B-12: 395 pg/mL (ref 200–1100)

## 2022-01-14 ENCOUNTER — Other Ambulatory Visit: Payer: Self-pay | Admitting: Family Medicine

## 2022-01-18 ENCOUNTER — Ambulatory Visit (INDEPENDENT_AMBULATORY_CARE_PROVIDER_SITE_OTHER): Payer: Medicare Other

## 2022-01-18 ENCOUNTER — Other Ambulatory Visit: Payer: Self-pay

## 2022-01-18 DIAGNOSIS — E538 Deficiency of other specified B group vitamins: Secondary | ICD-10-CM

## 2022-01-18 DIAGNOSIS — E539 Vitamin B deficiency, unspecified: Secondary | ICD-10-CM | POA: Diagnosis not present

## 2022-01-18 NOTE — Progress Notes (Signed)
After obtaining consent, and per orders of Dr. Pickard, injection of B12 given by Trampas Stettner H Charisse Wendell. Patient instructed to remain in clinic for 20 minutes afterwards, and to report any adverse reaction to me immediately. ° °

## 2022-02-15 ENCOUNTER — Ambulatory Visit (INDEPENDENT_AMBULATORY_CARE_PROVIDER_SITE_OTHER): Payer: Medicare Other

## 2022-02-15 ENCOUNTER — Other Ambulatory Visit: Payer: Self-pay

## 2022-02-15 DIAGNOSIS — E538 Deficiency of other specified B group vitamins: Secondary | ICD-10-CM

## 2022-02-15 NOTE — Progress Notes (Signed)
Pt given b12 inj on l-deltoid, pt tol tx well  ? ? ?

## 2022-02-16 ENCOUNTER — Other Ambulatory Visit: Payer: Self-pay | Admitting: Family Medicine

## 2022-02-24 ENCOUNTER — Other Ambulatory Visit: Payer: Self-pay | Admitting: Endocrinology

## 2022-02-24 ENCOUNTER — Other Ambulatory Visit (INDEPENDENT_AMBULATORY_CARE_PROVIDER_SITE_OTHER): Payer: Medicare Other

## 2022-02-24 DIAGNOSIS — R7303 Prediabetes: Secondary | ICD-10-CM | POA: Diagnosis not present

## 2022-02-24 DIAGNOSIS — E042 Nontoxic multinodular goiter: Secondary | ICD-10-CM

## 2022-02-24 LAB — T4, FREE: Free T4: 1.12 ng/dL (ref 0.60–1.60)

## 2022-02-24 LAB — TSH: TSH: 1.68 u[IU]/mL (ref 0.35–5.50)

## 2022-02-24 LAB — GLUCOSE, RANDOM: Glucose, Bld: 99 mg/dL (ref 70–99)

## 2022-02-24 LAB — HEMOGLOBIN A1C: Hgb A1c MFr Bld: 6.1 % (ref 4.6–6.5)

## 2022-03-01 ENCOUNTER — Encounter: Payer: Self-pay | Admitting: Endocrinology

## 2022-03-01 ENCOUNTER — Ambulatory Visit (INDEPENDENT_AMBULATORY_CARE_PROVIDER_SITE_OTHER): Payer: Medicare Other | Admitting: Endocrinology

## 2022-03-01 VITALS — BP 120/80 | Ht 66.0 in | Wt 176.0 lb

## 2022-03-01 DIAGNOSIS — R7303 Prediabetes: Secondary | ICD-10-CM

## 2022-03-01 DIAGNOSIS — E042 Nontoxic multinodular goiter: Secondary | ICD-10-CM

## 2022-03-01 DIAGNOSIS — E063 Autoimmune thyroiditis: Secondary | ICD-10-CM | POA: Diagnosis not present

## 2022-03-01 NOTE — Progress Notes (Signed)
? ? ?Patient ID: Lindsey Erickson, female   DOB: 1932/04/26, 88 y.o.   MRN: [id number] ? ? ?Reason for Appointment:  Endocrinology followup  ? ? ? History of Present Illness:  ? ?She initially had a multinodular goiter in 1982 ?She has had 4 biopsies of her thyroid nodules subsequently with the last one in 2019 which were benign ?Her biopsies had shown a few follicular cells and lymphocytes ?Last biopsy showed benign hyperplastic nodule category II ? ?Probable hypothyroidism:  ? ?Most likely she initially had been on thyroid suppression for the goiter rather than true hypothyroidism although previous records do indicate Hashimoto thyroiditis  ?The treatments that the patient  had taken include Synthroid 88 mcg for several years  ?This was reduced to 50 ?g in 11/16 because of low normal TSH of 0.36.          ? ?Because of her symptoms of feeling a little tired and cold despite normal TSH in the past her dose was increased from the 50 mcg dose with additional half tablet 3 times a week of her 50 mcg levothyroxine ? ?Subsequent TSH levels have been around 1.5-1.8 consistently ? ?She feels fairly good ?Has gained weight ? ?She is very regular with taking her levothyroxine regimen before breakfast  ?. ? ?Lab Results  ?Component Value Date  ? TSH 1.68 02/24/2022  ? TSH 1.83 02/02/2021  ? TSH 1.57 11/07/2020  ? FREET4 1.12 02/24/2022  ? FREET4 1.05 02/02/2021  ? FREET4 1.03 01/29/2020  ? ? ?GOITER: She has had a long-standing enlargement, mostly in the midline ?This has been stable in size ?Again has no symptoms of local pressure, difficulty swallowing or choking ? ?Her thyroid enlargement has been unchanged in the last few years ? ?She had a biopsy done elsewhere in 07/2018 ordered by her PCP and this was again benign, diagnosis was benign hyperplastic thyroid nodule, Bet[place]da category 2 ? ? ?PREDIABETES:  ? ?Her last fasting glucose previously has been as high as 129 in 2017 December ?At that time glucose tolerance  test did not show her glucose to be abnormal at 2 hours although it was 204 at 1 hour ? ?Not able to exercise much because of knee pain ?Usually is trying to eat healthy meals although may sometimes have more fruit and some sweets ?Has gained about 9 pounds and she is not sure why ?This likely happened after her fall ? ?A1c has been consistently in the upper normal range, has been as low as 5.4 and now 6.1 ?Fasting glucose now 99 ? ? ?Wt Readings from Last 3 Encounters:  ?03/01/22 176 lb (79.8 kg)  ?07/10/21 166 lb (75.3 kg)  ?07/01/21 167 lb 3.2 oz (75.8 kg)  ? ? ?Lab Results  ?Component Value Date  ? HGBA1C 6.1 02/24/2022  ? HGBA1C 5.8 02/02/2021  ? HGBA1C 5.6 07/08/2020  ? ?Lab Results  ?Component Value Date  ? Conrad 81 01/11/2022  ? CREATININE 0.87 01/11/2022  ? ? ? ?Allergies as of 03/01/2022   ? ?   Reactions  ? Ciprofloxacin Hives  ? Other Other (See Comments)  ? Frozen Blood Plasma-caused patient to crash / cardiac arrest / CAN NEVER TAKE THIS AGAIN-PER MD'S.  ?ALL NARCOTICS- NAUSEA AND VOMITING   ? Adhesive [tape] Itching, Swelling, Rash, Other (See Comments)  ? Also pain at location  ? Codeine Nausea And Vomiting  ? Bactrim [sulfamethoxazole-trimethoprim]   ? Muscle aches  ? Cephalexin Hives  ? Elemental Sulfur Hives, Nausea Only  ?  Lisinopril Swelling  ? Macrobid [nitrofurantoin Monohyd Macro] Nausea And Vomiting  ? Tamiflu [oseltamivir Phosphate]   ? ?  ? ?  ?Medication List  ?  ? ?  ? Accurate as of March 01, 2022  1:06 PM. If you have any questions, ask your nurse or doctor.  ?  ?  ? ?  ? ?acetaminophen 500 MG tablet ?Commonly known as: TYLENOL ?Take 500 mg by mouth every 6 (six) hours as needed for moderate pain. ?  ?carvedilol 40 MG 24 hr capsule ?Commonly known as: COREG CR ?TAKE 1 CAPSULE EVERY EVENING ?  ?CENTRUM SILVER 50+WOMEN PO ?Take 1 tablet by mouth daily. ?  ?clopidogrel 75 MG tablet ?Commonly known as: PLAVIX ?TAKE 1 TABLET DAILY ?  ?conjugated estrogens 0.625 MG/GM vaginal  cream ?Commonly known as: PREMARIN ?Place 1 Applicatorful vaginally 2 (two) times a week. Tuesday and Saturday. ?  ?Cyanocobalamin 2000 MCG/ML Soln ?Inject as directed. ?  ?EPINEPHrine 0.3 mg/0.3 mL Soaj injection ?Commonly known as: EPI-PEN ?SMARTSIG:0.3 Milliliter(s) IM Once PRN ?  ?famotidine 20 MG tablet ?Commonly known as: PEPCID ?Take 1 tablet (20 mg total) by mouth 2 (two) times daily. ?  ?fexofenadine 180 MG tablet ?Commonly known as: ALLEGRA ?Take 1 tablet (180 mg total) by mouth 2 (two) times daily. ?  ?hydrochlorothiazide 12.5 MG capsule ?Commonly known as: MICROZIDE ?TAKE 1 CAPSULE DAILY (NEED APPOINTMENT) ?  ?isosorbide mononitrate 60 MG 24 hr tablet ?Commonly known as: IMDUR ?TAKE 1 TABLET DAILY ?  ?nitroGLYCERIN 0.4 MG SL tablet ?Commonly known as: NITROSTAT ?DISSOLVE 1 TABLET UNDER THE TONGUE EVERY 5 MINUTES AS NEEDED FOR CHEST PAIN ?  ?OVER THE COUNTER MEDICATION ?Take 1 capsule by mouth daily. Cranberry D - [place] ?  ?rosuvastatin 20 MG tablet ?Commonly known as: CRESTOR ?TAKE 1 TABLET AT BEDTIME ?  ?Synthroid 50 MCG tablet ?Generic drug: levothyroxine ?TAKE 1 TABLET ALTERNATING WITH ONE AND ONE-HALF TABLETS AS DIRECTED ?  ?trimethoprim 100 MG tablet ?Commonly known as: TRIMPEX ?Take 100 mg by mouth daily. ?  ? ?  ? ? ?Allergies:  ?Allergies  ?Allergen Reactions  ? Ciprofloxacin Hives  ? Other Other (See Comments)  ?  Frozen Blood Plasma-caused patient to crash / cardiac arrest / CAN NEVER TAKE THIS AGAIN-PER MD'S.  ? ?ALL NARCOTICS- NAUSEA AND VOMITING   ? Adhesive [Tape] Itching, Swelling, Rash and Other (See Comments)  ?  Also pain at location  ? Codeine Nausea And Vomiting  ? Bactrim [Sulfamethoxazole-Trimethoprim]   ?  Muscle aches  ? Cephalexin Hives  ? Elemental Sulfur Hives and Nausea Only  ? Lisinopril Swelling  ? Macrobid [Nitrofurantoin Monohyd Macro] Nausea And Vomiting  ? Tamiflu [Oseltamivir Phosphate]   ? ? ?Past Medical History:  ?Diagnosis Date  ? Anemia   ? Angio-edema   ?  Arthritis   ? Blood transfusion without reported diagnosis 1957  ? CAD (coronary artery disease)   ? a. NSTEMI 2004 with thrombus in Cx and mod RCA. b. Nonischemic nuc 2008. c. mild NSTEMI 01/2013 (trop 0.42) -> cath with unchanged mod LAD & RCA disease, for continued medical therapy.  ? Cancer [facility])   ? Cataract   ? Dementia ([place])   ? has improved-suspect b12 deficincy and concussion were to blame  ? H/O Hashimoto thyroiditis   ? Hyperlipidemia   ? HYPERTENSION   ? a. Also has h/o intermittentl low BP.  ? Hypothyroidism   ? Kidney stones   ? Myocardial infarction [facility])   ? Osteoporosis   ?  no fractures  ? Prediabetes   ? QT prolongation   ? a. Prior EKGs QTc 474, but on 4/1 was 524 for unclear reason.  ? Recurrent urinary tract infection   ? Squamous cell carcinoma of skin 04/17/2015  ? Urticaria   ? ? ?Past Surgical History:  ?Procedure Laterality Date  ? ADENOIDECTOMY    ? APPENDECTOMY  1999  ? Bilateral Arthroscopies of knees    ? CARDIAC CATHETERIZATION    ? Moderate 3 vessel CAD by cath 05/2012  ? CHOLECYSTECTOMY  02/2010  ? JOINT REPLACEMENT  01/2010  ? L TKR - allusio  ? LEFT HEART CATHETERIZATION WITH CORONARY ANGIOGRAM N/A 05/22/2012  ? Procedure: LEFT HEART CATHETERIZATION WITH CORONARY ANGIOGRAM;  Surgeon: Sinclair Grooms, MD;  Location[facility] LAB;  Service: Cardiovascular;  Laterality: N/A;  ? LEFT HEART CATHETERIZATION WITH CORONARY ANGIOGRAM N/A 02/20/2014  ? Procedure: LEFT HEART CATHETERIZATION WITH CORONARY ANGIOGRAM;  Surgeon: Peter M Martinique, MD;  Location: [facility] CATH LAB;  Service: Cardiovascular;  Laterality: N/A;  ? TONSILLECTOMY    ? TUBAL LIGATION    ? ? ?Family History  ?Problem Relation Age of Onset  ? Allergies Father   ? Cancer Father   ? Coronary artery disease Mother 84  ? Heart disease Mother   ? CAD Sister 23  ? Cancer Sister   ? AAA (abdominal aortic aneurysm) Sister   ? Hypertension Sister   ? Hyperlipidemia Sister   ? Diabetes Sister   ? Heart disease Sister   ? Diabetes Maternal Aunt   ?  Allergic rhinitis Neg Hx   ? Asthma Neg Hx   ? Eczema Neg Hx   ? Urticaria Neg Hx   ? ? ?Social History:  reports that she has never smoked. She has never used smokeless tobacco. She reports that she does not currently [place]

## 2022-03-11 ENCOUNTER — Other Ambulatory Visit: Payer: Self-pay | Admitting: Family Medicine

## 2022-03-22 ENCOUNTER — Ambulatory Visit (INDEPENDENT_AMBULATORY_CARE_PROVIDER_SITE_OTHER): Payer: Medicare Other

## 2022-03-22 DIAGNOSIS — E539 Vitamin B deficiency, unspecified: Secondary | ICD-10-CM | POA: Diagnosis not present

## 2022-03-22 DIAGNOSIS — E538 Deficiency of other specified B group vitamins: Secondary | ICD-10-CM

## 2022-03-22 NOTE — Progress Notes (Signed)
Pt give b12 inj on l-upper deltoid. Pt tol well

## 2022-03-29 ENCOUNTER — Other Ambulatory Visit: Payer: Self-pay | Admitting: Endocrinology

## 2022-04-26 ENCOUNTER — Ambulatory Visit: Payer: TRICARE For Life (TFL)

## 2022-05-03 ENCOUNTER — Ambulatory Visit (INDEPENDENT_AMBULATORY_CARE_PROVIDER_SITE_OTHER): Payer: Medicare Other

## 2022-05-03 ENCOUNTER — Ambulatory Visit: Payer: Medicare Other

## 2022-05-03 DIAGNOSIS — E539 Vitamin B deficiency, unspecified: Secondary | ICD-10-CM

## 2022-05-03 NOTE — Progress Notes (Addendum)
Pt given B12 inj in the L-upper arm. Pt tol well and left ambulatory w/no c/o

## 2022-06-07 ENCOUNTER — Ambulatory Visit (INDEPENDENT_AMBULATORY_CARE_PROVIDER_SITE_OTHER): Payer: Medicare Other

## 2022-06-07 DIAGNOSIS — E538 Deficiency of other specified B group vitamins: Secondary | ICD-10-CM | POA: Diagnosis not present

## 2022-06-07 MED ORDER — CYANOCOBALAMIN 1000 MCG/ML IJ SOLN: 1000.0000 ug | Freq: Once | INTRAMUSCULAR | Status: DC

## 2022-06-07 NOTE — Progress Notes (Signed)
Vitamin b12 injection given IM to L deltoid. Pt tolerated well.

## 2022-06-23 ENCOUNTER — Ambulatory Visit (INDEPENDENT_AMBULATORY_CARE_PROVIDER_SITE_OTHER): Payer: Medicare Other | Admitting: Cardiovascular Disease

## 2022-06-23 ENCOUNTER — Encounter: Payer: Self-pay | Admitting: Cardiovascular Disease

## 2022-06-23 VITALS — BP 116/68 | HR 68 | Ht 66.0 in | Wt 179.0 lb

## 2022-06-23 DIAGNOSIS — I10 Essential (primary) hypertension: Secondary | ICD-10-CM

## 2022-06-23 DIAGNOSIS — E782 Mixed hyperlipidemia: Secondary | ICD-10-CM

## 2022-06-23 DIAGNOSIS — I2510 Atherosclerotic heart disease of native coronary artery without angina pectoris: Secondary | ICD-10-CM

## 2022-06-23 NOTE — Assessment & Plan Note (Signed)
History of hyperlipidemia on statin therapy with lipid profile performed 01/11/2022 revealing total cholesterol 172, LDL of 81 and an HDL of 71.

## 2022-06-23 NOTE — Assessment & Plan Note (Signed)
History of CAD status post non-STEMI in 2004 with thrombus in the circumflex and moderate RCA disease.  She had cath by Dr. Tomasa Hosteller after being admitted with chest pain and positive enzymes.  Her anatomy at the time of her catheter revealed 70% mid RCA lesion, 60% mid circumflex lesion.  The RCA underwent FFR analysis that was negative.  She denies chest pain or shortness of breath.

## 2022-06-23 NOTE — Progress Notes (Signed)
06/23/2022 Lindsey Erickson   03-20-1932  [id number]  Primary Physician Pickard, Lindsey Mcgee, MD Primary Cardiologist: Lindsey Harp MD Lindsey Erickson, [place]  HPI:  Lindsey Erickson is a 88 y.o.  mildly overweight married Caucasian female mother of 2 children , grandmother of 4 grandchildren who was self-referred to be established in my practice. She was previously a cardiology patient of Dr. Pernell Erickson. Her new primary care physician is Dr. Margaretmary Erickson at [facility] family [facility]. I last saw her in the office 05/27/2020.. Cardiovascular risk factor profile is notable for treated hypertension and hyperlipidemia. She does have a family history his sister who's had stents. Her cardiac history dates back to 2004 when she had a non-STEMI related to thrombus in the circumflex with moderate RCA disease. She had a negative nuclear stress test in 2008 and underwent cardiac catheterization one year ago on 02/20/14 by Dr. Irish Erickson after being admitted with chest pain and evolving positive enzymes. She gets infrequent nitroglycerin responsive chest pain. Her anatomy at the time of her last cath was notable for a 70% mid RCA lesion, 50-60% mid-circumflex lesion.  The RCA underwent FFR interrogation which measured 0.9 suggesting that it was not physiologically significant. Since I saw her a year ago she's remained clinically stable without chest pain or shortness of breath. She was seen in the emergency room angioedema related to lisinopril which was discontinued. Blood pressure at home have remained under good control.   Since I saw her in the office 1 year ago she did well .  She walks around the house without limitation.  She denies chest pain or shortness of breath.   Current Meds  Medication Sig   acetaminophen (TYLENOL) 500 MG tablet Take 500 mg by mouth every 6 (six) hours as needed for moderate pain.   carvedilol (COREG CR) 40 MG 24 hr capsule TAKE 1 CAPSULE EVERY EVENING   clopidogrel (PLAVIX) 75  MG tablet TAKE 1 TABLET DAILY   conjugated estrogens (PREMARIN) vaginal cream Place 1 Applicatorful vaginally 2 (two) times a week. Tuesday and Saturday.   Cyanocobalamin 2000 MCG/ML SOLN Inject as directed.   EPINEPHrine 0.3 mg/0.3 mL IJ SOAJ injection SMARTSIG:0.3 Milliliter(s) IM Once PRN   famotidine (PEPCID) 20 MG tablet Take 1 tablet (20 mg total) by mouth 2 (two) times daily.   fexofenadine (ALLEGRA) 180 MG tablet Take 1 tablet (180 mg total) by mouth 2 (two) times daily.   hydrochlorothiazide (MICROZIDE) 12.5 MG capsule TAKE 1 CAPSULE DAILY (NEED APPOINTMENT)   isosorbide mononitrate (IMDUR) 60 MG 24 hr tablet TAKE 1 TABLET DAILY   Multiple Vitamins-Minerals (CENTRUM SILVER 50+WOMEN PO) Take 1 tablet by mouth daily.   nitroGLYCERIN (NITROSTAT) 0.4 MG SL tablet DISSOLVE 1 TABLET UNDER THE TONGUE EVERY 5 MINUTES AS NEEDED FOR CHEST PAIN   OVER THE COUNTER MEDICATION Take 1 capsule by mouth daily. Cranberry D - [place]   rosuvastatin (CRESTOR) 20 MG tablet TAKE 1 TABLET AT BEDTIME   SYNTHROID 50 MCG tablet TAKE 1 TABLET ALTERNATING WITH ONE AND ONE-HALF TABLETS AS DIRECTED   trimethoprim (TRIMPEX) 100 MG tablet Take 100 mg by mouth daily.   Current Facility-Administered Medications for the 06/23/22 encounter (Office Visit) with Lindsey Harp, MD  Medication   cyanocobalamin ((VITAMIN B-12)) injection 1,000 mcg   cyanocobalamin ((VITAMIN B-12)) injection 1,000 mcg     Allergies  Allergen Reactions   Ciprofloxacin Hives   Other Other (See Comments)    Frozen Blood  Plasma-caused patient to crash / cardiac arrest / CAN NEVER TAKE THIS AGAIN-PER MD'S.   ALL NARCOTICS- NAUSEA AND VOMITING    Adhesive [Tape] Itching, Swelling, Rash and Other (See Comments)    Also pain at location   Codeine Nausea And Vomiting   Bactrim [Sulfamethoxazole-Trimethoprim]     Muscle aches   Cephalexin Hives   Elemental Sulfur Hives and Nausea Only   Lisinopril Swelling   Macrobid [Nitrofurantoin  Monohyd Macro] Nausea And Vomiting   Tamiflu [Oseltamivir Phosphate]     Social History   Socioeconomic History   Marital status: Married    Spouse name: Not on file   Number of children: Not on file   Years of education: Not on file   Highest education level: Not on file  Occupational History   Not on file  Tobacco Use   Smoking status: Never   Smokeless tobacco: Never  Vaping Use   Vaping Use: Never used  Substance and Sexual Activity   Alcohol use: Not Currently   Drug use: Never   Sexual activity: Not Currently  Other Topics Concern   Not on file  Social History Narrative   ** Merged History Encounter **       Married, 2 children. Retired from [employer] Strain: Not on file  Food Insecurity: Not on file  Transportation Needs: Not on file  Physical Activity: Not on file  Stress: Not on file  Social Connections: Not on file  Intimate Partner Violence: Not on file     Review of Systems: General: negative for chills, fever, night sweats or weight changes.  Cardiovascular: negative for chest pain, dyspnea on exertion, edema, orthopnea, palpitations, paroxysmal nocturnal dyspnea or shortness of breath Dermatological: negative for rash Respiratory: negative for cough or wheezing Urologic: negative for hematuria Abdominal: negative for nausea, vomiting, diarrhea, bright red blood per rectum, melena, or hematemesis Neurologic: negative for visual changes, syncope, or dizziness All other systems reviewed and are otherwise negative except as noted above.    Blood pressure 116/68, pulse 68, height '5\' 6"'$  (1.676 m), weight 179 lb (81.2 kg).  General appearance: alert and no distress Neck: no adenopathy, no carotid bruit, no JVD, supple, symmetrical, trachea midline, and thyroid not enlarged, symmetric, no tenderness/mass/nodules Lungs: clear to auscultation bilaterally Heart: regular rate and rhythm, S1, S2  normal, no murmur, click, rub or gallop Extremities: extremities normal, atraumatic, no cyanosis or edema Pulses: 2+ and symmetric Skin: Skin color, texture, turgor normal. No rashes or lesions Neurologic: Grossly normal  EKG sinus rhythm at 68 without ST or T wave changes.  There are septal Q waves.  I personally reviewed this EKG.  ASSESSMENT AND PLAN:   Essential hypertension History of essential hypertension a blood pressure measured today at 116/68.  She is on carvedilol, and hydrochlorothiazide.  CAD (coronary artery disease) History of CAD status post non-STEMI in 2004 with thrombus in the circumflex and moderate RCA disease.  She had cath by Dr. Tomasa Hosteller after being admitted with chest pain and positive enzymes.  Her anatomy at the time of her catheter revealed 70% mid RCA lesion, 60% mid circumflex lesion.  The RCA underwent FFR analysis that was negative.  She denies chest pain or shortness of breath.  Hyperlipidemia History of hyperlipidemia on statin therapy with lipid profile performed 01/11/2022 revealing total cholesterol 172, LDL of 81 and an HDL of 71.     Lindsey Harp MD  FACP,FACC,FAHA, FSCAI 06/23/2022 12:18 PM

## 2022-06-23 NOTE — Assessment & Plan Note (Signed)
History of essential hypertension a blood pressure measured today at 116/68.  She is on carvedilol, and hydrochlorothiazide.

## 2022-06-23 NOTE — Patient Instructions (Signed)
Medication Instructions:  No Changes In Medications at this time.  *If you need a refill on your cardiac medications before your next appointment, please call your pharmacy*  Lab Work: None Ordered At This Time.  If you have labs (blood work) drawn today and your tests are completely normal, you will receive your results only by: [place] (if you have MyChart) OR A paper copy in the mail If you have any lab test that is abnormal or we need to change your treatment, we will call you to review the results.  Testing/Procedures: None Ordered At This Time.   Follow-Up: At [facility], you and your health needs are our priority.  As part of our continuing mission to provide you with exceptional heart care, we have created designated Provider Care Teams.  These Care Teams include your primary Cardiologist (physician) and Advanced Practice Providers (APPs -  Physician Assistants and Nurse Practitioners) who all work together to provide you with the care you need, when you need it.  Your next appointment:   1 year(s)  The format for your next appointment:   In Person  Provider:   Quay Burow, MD

## 2022-07-09 ENCOUNTER — Other Ambulatory Visit: Payer: Medicare Other

## 2022-07-09 ENCOUNTER — Other Ambulatory Visit: Payer: Self-pay

## 2022-07-09 DIAGNOSIS — I10 Essential (primary) hypertension: Secondary | ICD-10-CM

## 2022-07-09 DIAGNOSIS — E782 Mixed hyperlipidemia: Secondary | ICD-10-CM

## 2022-07-09 NOTE — Addendum Note (Signed)
Addended by: Colman Cater on: 07/09/2022 08:56 AM   Modules accepted: Orders

## 2022-07-10 LAB — CBC WITH DIFFERENTIAL/PLATELET
Absolute Monocytes: 470 cells/uL (ref 200–950)
Basophils Absolute: 49 cells/uL (ref 0–200)
Basophils Relative: 0.9 %
Eosinophils Absolute: 167 cells/uL (ref 15–500)
Eosinophils Relative: 3.1 %
HCT: 38.9 % (ref 35.0–45.0)
Hemoglobin: 13.5 g/dL (ref 11.7–15.5)
Lymphs Abs: 1372 cells/uL (ref 850–3900)
MCH: 33.4 pg — ABNORMAL HIGH (ref 27.0–33.0)
MCHC: 34.7 g/dL (ref 32.0–36.0)
MCV: 96.3 fL (ref 80.0–100.0)
MPV: 9.7 fL (ref 7.5–12.5)
Monocytes Relative: 8.7 %
Neutro Abs: 3343 cells/uL (ref 1500–7800)
Neutrophils Relative %: 61.9 %
Platelets: 217 10*3/uL (ref 140–400)
RBC: 4.04 10*6/uL (ref 3.80–5.10)
RDW: 12.1 % (ref 11.0–15.0)
Total Lymphocyte: 25.4 %
WBC: 5.4 10*3/uL (ref 3.8–10.8)

## 2022-07-10 LAB — COMPREHENSIVE METABOLIC PANEL
AG Ratio: 1.5 (calc) (ref 1.0–2.5)
ALT: 12 U/L (ref 6–29)
AST: 18 U/L (ref 10–35)
Albumin: 4 g/dL (ref 3.6–5.1)
Alkaline phosphatase (APISO): 59 U/L (ref 37–153)
BUN: 16 mg/dL (ref 7–25)
CO2: 27 mmol/L (ref 20–32)
Calcium: 9.2 mg/dL (ref 8.6–10.4)
Chloride: 100 mmol/L (ref 98–110)
Creat: 0.9 mg/dL (ref 0.60–0.95)
Globulin: 2.6 g/dL (calc) (ref 1.9–3.7)
Glucose, Bld: 106 mg/dL — ABNORMAL HIGH (ref 65–99)
Potassium: 4.6 mmol/L (ref 3.5–5.3)
Sodium: 135 mmol/L (ref 135–146)
Total Bilirubin: 0.6 mg/dL (ref 0.2–1.2)
Total Protein: 6.6 g/dL (ref 6.1–8.1)

## 2022-07-10 LAB — LIPID PANEL
Cholesterol: 157 mg/dL (ref <200)
HDL: 74 mg/dL (ref >=50)
LDL Cholesterol (Calc): 62 mg/dL (calc)
Non-HDL Cholesterol (Calc): 83 mg/dL (calc) (ref <130)
Total CHOL/HDL Ratio: 2.1 (calc) (ref <5.0)
Triglycerides: 126 mg/dL (ref <150)

## 2022-07-12 ENCOUNTER — Ambulatory Visit: Payer: Medicare Other

## 2022-07-12 ENCOUNTER — Ambulatory Visit (INDEPENDENT_AMBULATORY_CARE_PROVIDER_SITE_OTHER): Payer: Medicare Other | Admitting: Family Medicine

## 2022-07-12 VITALS — BP 118/62 | HR 66 | Temp 97.7°F | Ht 66.5 in | Wt 174.0 lb

## 2022-07-12 DIAGNOSIS — Z0000 Encounter for general adult medical examination without abnormal findings: Secondary | ICD-10-CM

## 2022-07-12 DIAGNOSIS — E538 Deficiency of other specified B group vitamins: Secondary | ICD-10-CM | POA: Diagnosis not present

## 2022-07-12 DIAGNOSIS — I10 Essential (primary) hypertension: Secondary | ICD-10-CM | POA: Diagnosis not present

## 2022-07-12 DIAGNOSIS — E042 Nontoxic multinodular goiter: Secondary | ICD-10-CM | POA: Diagnosis not present

## 2022-07-12 DIAGNOSIS — E7800 Pure hypercholesterolemia, unspecified: Secondary | ICD-10-CM

## 2022-07-12 MED ORDER — POLYMYXIN B-TRIMETHOPRIM 10000-0.1 UNIT/ML-% OP SOLN: 2.0000 [drp] | Freq: Four times a day (QID) | OPHTHALMIC | 0 refills | Status: DC

## 2022-07-12 MED ORDER — CYANOCOBALAMIN 1000 MCG/ML IJ SOLN
1000.0000 ug | Freq: Once | INTRAMUSCULAR | Status: AC
  Administered 2022-07-12: 1000 ug via INTRAMUSCULAR

## 2022-07-12 NOTE — Progress Notes (Signed)
Subjective:    Patient ID: Lindsey Erickson, female    DOB: 01-May-1932, 88 y.o.   MRN: [id number]  HPI   Patient is a very pleasant 88 year old white female who is here for CPE. She has a significant past medical history of coronary artery disease. According to her report she is suffered 2 separate myocardial infarctions. However on repeat catheterizations, the patient states that she has lesions which are not amenable to stenting. Therefore she is being managed medically. She is currently on a long-acting nitroglycerin, beta blocker, and ACE inhibitor, statin medication, and dual antiplatelet therapy. She follows up regularly with her cardiologist.  She is currently on Synthroid to help suppress the growth of her goiter.  She follows up regularly with her endocrinologist.  Due to her age she does not require a Pap smear or colonoscopy.  Since I last saw the patient, her daughter was diagnosed with cholangiocarcinoma however the patient seems to be dealing with this remarkably well.  She declines a mammogram.  She is taking calcium and vitamin D.  She still gets monthly B12 injections due to B12 deficiency that manifested with memory loss and falls.  Since starting the B12, she has done extremely well.  She denies any additional memory loss or falls or problems with her balance. Past Medical History:  Diagnosis Date   Anemia    Angio-edema    Arthritis    Blood transfusion without reported diagnosis 1957   CAD (coronary artery disease)    a. NSTEMI 2004 with thrombus in Cx and mod RCA. b. Nonischemic nuc 2008. c. mild NSTEMI 01/2013 (trop 0.42) -> cath with unchanged mod LAD & RCA disease, for continued medical therapy.   Cancer ([place])    Cataract    Dementia ([place])    has improved-suspect b12 deficincy and concussion were to blame   H/O Hashimoto thyroiditis    Hyperlipidemia    HYPERTENSION    a. Also has h/o intermittentl low BP.   Hypothyroidism    Kidney stones    Myocardial infarction  [facility])    Osteoporosis    no fractures   Prediabetes    QT prolongation    a. Prior EKGs QTc 474, but on 4/1 was 524 for unclear reason.   Recurrent urinary tract infection    Squamous cell carcinoma of skin 04/17/2015   Urticaria    Past Surgical History:  Procedure Laterality Date   ADENOIDECTOMY     APPENDECTOMY  1999   Bilateral Arthroscopies of knees     CARDIAC CATHETERIZATION     Moderate 3 vessel CAD by cath 05/2012   CHOLECYSTECTOMY  02/2010   JOINT REPLACEMENT  01/2010   L TKR - allusio   LEFT HEART CATHETERIZATION WITH CORONARY ANGIOGRAM N/A 05/22/2012   Procedure: LEFT HEART CATHETERIZATION WITH CORONARY ANGIOGRAM;  Surgeon: Sinclair Grooms, MD;  Location: [facility] C[facility] LAB;  Service: Cardiovascular;  Laterality: N/A;   LEFT HEART CATHETERIZATION WITH CORONARY ANGIOGRAM N/A 02/20/2014   Procedure: LEFT HEART CATHETERIZATION WITH CORONARY ANGIOGRAM;  Surgeon: Peter M Martinique, MD;  Location: [facility] CATH LAB;  Service: Cardiovascular;  Laterality: N/A;   TONSILLECTOMY     TUBAL LIGATION     Current Outpatient Medications on File Prior to Visit  Medication Sig Dispense Refill   acetaminophen (TYLENOL) 500 MG tablet Take 500 mg by mouth every 6 (six) hours as needed for moderate pain.     carvedilol (COREG CR) 40 MG 24 hr capsule TAKE 1  CAPSULE EVERY EVENING 90 capsule 3   clopidogrel (PLAVIX) 75 MG tablet TAKE 1 TABLET DAILY 90 tablet 3   conjugated estrogens (PREMARIN) vaginal cream Place 1 Applicatorful vaginally 2 (two) times a week. Tuesday and Saturday.     Cyanocobalamin 2000 MCG/ML SOLN Inject as directed.     EPINEPHrine 0.3 mg/0.3 mL IJ SOAJ injection SMARTSIG:0.3 Milliliter(s) IM Once PRN 1 each 5   famotidine (PEPCID) 20 MG tablet Take 1 tablet (20 mg total) by mouth 2 (two) times daily. 180 tablet 1   fexofenadine (ALLEGRA) 180 MG tablet Take 1 tablet (180 mg total) by mouth 2 (two) times daily. 180 tablet 5   hydrochlorothiazide (MICROZIDE) 12.5 MG capsule TAKE 1 CAPSULE DAILY  (NEED APPOINTMENT) 90 capsule 3   isosorbide mononitrate (IMDUR) 60 MG 24 hr tablet TAKE 1 TABLET DAILY 90 tablet 3   Multiple Vitamins-Minerals (CENTRUM SILVER 50+WOMEN PO) Take 1 tablet by mouth daily.     nitroGLYCERIN (NITROSTAT) 0.4 MG SL tablet DISSOLVE 1 TABLET UNDER THE TONGUE EVERY 5 MINUTES AS NEEDED FOR CHEST PAIN 75 tablet 3   OVER THE COUNTER MEDICATION Take 1 capsule by mouth daily. Cranberry D - [place]     rosuvastatin (CRESTOR) 20 MG tablet TAKE 1 TABLET AT BEDTIME 90 tablet 3   SYNTHROID 50 MCG tablet TAKE 1 TABLET ALTERNATING WITH ONE AND ONE-HALF TABLETS AS DIRECTED 108 tablet 3   trimethoprim (TRIMPEX) 100 MG tablet Take 100 mg by mouth daily.     Current Facility-Administered Medications on File Prior to Visit  Medication Dose Route Frequency Provider Last Rate Last Admin   cyanocobalamin ((VITAMIN B-12)) injection 1,000 mcg  1,000 mcg Intramuscular Q30 days Susy Frizzle, MD   1,000 mcg at 05/03/22 1642   cyanocobalamin ((VITAMIN B-12)) injection 1,000 mcg  1,000 mcg Intramuscular Once Susy Frizzle, MD       Allergies  Allergen Reactions   Ciprofloxacin Hives   Other Other (See Comments)    Frozen Blood Plasma-caused patient to crash / cardiac arrest / CAN NEVER TAKE THIS AGAIN-PER MD'S.   ALL NARCOTICS- NAUSEA AND VOMITING    Adhesive [Tape] Itching, Swelling, Rash and Other (See Comments)    Also pain at location   Codeine Nausea And Vomiting   Bactrim [Sulfamethoxazole-Trimethoprim]     Muscle aches   Cephalexin Hives   Elemental Sulfur Hives and Nausea Only   Lisinopril Swelling   Macrobid [Nitrofurantoin Monohyd Macro] Nausea And Vomiting   Tamiflu [Oseltamivir Phosphate]    Social History   Socioeconomic History   Marital status: Married    Spouse name: Not on file   Number of children: Not on file   Years of education: Not on file   Highest education level: Not on file  Occupational History   Not on file  Tobacco Use   Smoking status:  Never   Smokeless tobacco: Never  Vaping Use   Vaping Use: Never used  Substance and Sexual Activity   Alcohol use: Not Currently   Drug use: Never   Sexual activity: Not Currently  Other Topics Concern   Not on file  Social History Narrative   ** Merged History Encounter **       Married, 2 children. Retired from [employer] Strain: Not on file  Food Insecurity: Not on file  Transportation Needs: Not on file  Physical Activity: Not on file  Stress: Not on file  Social Connections: Not on file  Intimate Partner Violence: Not on file   Family History  Problem Relation Age of Onset   Allergies Father    Cancer Father    Coronary artery disease Mother 69   Heart disease Mother    CAD Sister 63   Cancer Sister    AAA (abdominal aortic aneurysm) Sister    Hypertension Sister    Hyperlipidemia Sister    Diabetes Sister    Heart disease Sister    Diabetes Maternal Aunt    Allergic rhinitis Neg Hx    Asthma Neg Hx    Eczema Neg Hx    Urticaria Neg Hx       Review of Systems  All other systems reviewed and are negative.      Objective:   Physical Exam Vitals reviewed.  Constitutional:      General: She is not in acute distress.    Appearance: She is well-developed. She is not diaphoretic.  Neck:     Thyroid: Thyromegaly present.     Vascular: No JVD.     Trachea: No tracheal deviation.  Cardiovascular:     Rate and Rhythm: Normal rate and regular rhythm.     Heart sounds: Normal heart sounds. No murmur heard. Pulmonary:     Effort: Pulmonary effort is normal. No respiratory distress.     Breath sounds: Normal breath sounds. No wheezing or rales.  Abdominal:     General: Bowel sounds are normal. There is no distension.     Palpations: Abdomen is soft.     Tenderness: There is no abdominal tenderness. There is no guarding or rebound.  Musculoskeletal:     Cervical back: Neck supple.   Lymphadenopathy:     Cervical: No cervical adenopathy.  Neurological:     Mental Status: She is alert and oriented to person, place, and time.     Cranial Nerves: No cranial nerve deficit.     Motor: No abnormal muscle tone.     Coordination: Coordination normal.     Deep Tendon Reflexes: Reflexes are normal and symmetric.          Assessment & Plan:  B12 deficiency - Plan: cyanocobalamin (VITAMIN B12) injection 1,000 mcg  General medical exam  Essential hypertension  Multinodular goiter  Pure hypercholesterolemia Patient received for B12 injections today.  Her memory loss is stable and improved since starting B12.  She denies any falls or depression.  Blood pressure is outstanding.  Due to age, patient declines any mammogram, colonoscopy, Pap smear.  She is taking calcium 1200 mg a day and vitamin D 1000 units a day.  Lab work is outstanding.  I did recommend a flu shot this fall, and RSV shot, COVID booster in 1 month with a new version becomes available.  Also recommended Shingrix.  Regular anticipatory guidance is provided.  LDL cholesterol is well below 70.  Defer the management of her goiter to her endocrinologist.  However patient denies any trouble swallowing, breathing, dysphagia, or stridor Orders Only on 07/09/2022  Component Date Value Ref Range Status   Glucose, Bld 07/09/2022 106 (H)  65 - 99 mg/dL Final   Comment: .            Fasting reference interval . For someone without known diabetes, a glucose value between 100 and 125 mg/dL is consistent with prediabetes and should be confirmed with a follow-up test. .    BUN 07/09/2022 16  7 - 25 mg/dL Final  Creat 07/09/2022 0.90  0.60 - 0.95 mg/dL Final   BUN/Creatinine Ratio 07/09/2022 SEE NOTE:  6 - 22 (calc) Final   Comment:    Not Reported: BUN and Creatinine are within    reference range. .    Sodium 07/09/2022 135  135 - 146 mmol/L Final   Potassium 07/09/2022 4.6  3.5 - 5.3 mmol/L Final   Chloride  07/09/2022 100  98 - 110 mmol/L Final   CO2 07/09/2022 27  20 - 32 mmol/L Final   Calcium 07/09/2022 9.2  8.6 - 10.4 mg/dL Final   Total Protein 07/09/2022 6.6  6.1 - 8.1 g/dL Final   Albumin 07/09/2022 4.0  3.6 - 5.1 g/dL Final   Globulin 07/09/2022 2.6  1.9 - 3.7 g/dL (calc) Final   AG Ratio 07/09/2022 1.5  1.0 - 2.5 (calc) Final   Total Bilirubin 07/09/2022 0.6  0.2 - 1.2 mg/dL Final   Alkaline phosphatase (APISO) 07/09/2022 59  37 - 153 U/L Final   AST 07/09/2022 18  10 - 35 U/L Final   ALT 07/09/2022 12  6 - 29 U/L Final   WBC 07/09/2022 5.4  3.8 - 10.8 Thousand/uL Final   RBC 07/09/2022 4.04  3.80 - 5.10 Million/uL Final   Hemoglobin 07/09/2022 13.5  11.7 - 15.5 g/dL Final   HCT 07/09/2022 38.9  35.0 - 45.0 % Final   MCV 07/09/2022 96.3  80.0 - 100.0 fL Final   MCH 07/09/2022 33.4 (H)  27.0 - 33.0 pg Final   MCHC 07/09/2022 34.7  32.0 - 36.0 g/dL Final   RDW 07/09/2022 12.1  11.0 - 15.0 % Final   Platelets 07/09/2022 217  140 - 400 Thousand/uL Final   MPV 07/09/2022 9.7  7.5 - 12.5 fL Final   Neutro Abs 07/09/2022 3,343  1,500 - 7,800 cells/uL Final   Lymphs Abs 07/09/2022 1,372  850 - 3,900 cells/uL Final   Absolute Monocytes 07/09/2022 470  200 - 950 cells/uL Final   Eosinophils Absolute 07/09/2022 167  15 - 500 cells/uL Final   Basophils Absolute 07/09/2022 49  0 - 200 cells/uL Final   Neutrophils Relative % 07/09/2022 61.9  % Final   Total Lymphocyte 07/09/2022 25.4  % Final   Monocytes Relative 07/09/2022 8.7  % Final   Eosinophils Relative 07/09/2022 3.1  % Final   Basophils Relative 07/09/2022 0.9  % Final   Cholesterol 07/09/2022 157  <200 mg/dL Final   HDL 07/09/2022 74  > OR = 50 mg/dL Final   Triglycerides 07/09/2022 126  <150 mg/dL Final   LDL Cholesterol (Calc) 07/09/2022 62  mg/dL (calc) Final   Comment: Reference range: <100 . Desirable range <100 mg/dL for primary prevention;   <70 mg/dL for patients with CHD or diabetic patients  with > or = 2 CHD risk  factors. Marland Kitchen LDL-C is now calculated using the Martin-Hopkins  calculation, which is a validated novel method providing  better accuracy than the Friedewald equation in the  estimation of LDL-C.  Cresenciano Genre et al. Annamaria Helling. 1157;262(03): 2061-2068  (http://education.[facility].com/faq/FAQ164)    Total CHOL/HDL Ratio 07/09/2022 2.1  <5.0 (calc) Final   Non-HDL Cholesterol (Calc) 07/09/2022 83  <130 mg/dL (calc) Final   Comment: For patients with diabetes plus 1 major ASCVD risk  factor, treating to a non-HDL-C goal of <100 mg/dL  (LDL-C of <70 mg/dL) is considered a therapeutic  option.

## 2022-08-13 ENCOUNTER — Ambulatory Visit (INDEPENDENT_AMBULATORY_CARE_PROVIDER_SITE_OTHER): Payer: Medicare Other

## 2022-08-13 DIAGNOSIS — E538 Deficiency of other specified B group vitamins: Secondary | ICD-10-CM | POA: Diagnosis not present

## 2022-08-13 MED ORDER — CYANOCOBALAMIN 1000 MCG/ML IJ SOLN
1000.0000 ug | Freq: Once | INTRAMUSCULAR | Status: AC
  Administered 2022-08-13: 1000 ug via INTRAMUSCULAR

## 2022-08-30 ENCOUNTER — Ambulatory Visit: Payer: Medicare Other | Admitting: Endocrinology

## 2022-09-08 ENCOUNTER — Ambulatory Visit (INDEPENDENT_AMBULATORY_CARE_PROVIDER_SITE_OTHER): Payer: Medicare Other | Admitting: Allergy

## 2022-09-08 ENCOUNTER — Encounter: Payer: Self-pay | Admitting: Allergy

## 2022-09-08 VITALS — BP 150/72 | HR 79 | Temp 97.8°F | Resp 16 | Wt 177.0 lb

## 2022-09-08 DIAGNOSIS — I2510 Atherosclerotic heart disease of native coronary artery without angina pectoris: Secondary | ICD-10-CM

## 2022-09-08 DIAGNOSIS — T783XXD Angioneurotic edema, subsequent encounter: Secondary | ICD-10-CM | POA: Diagnosis not present

## 2022-09-08 NOTE — Patient Instructions (Addendum)
Allergic Angioedema (lip swelling)     - avoid foods that contain Paprika in ingredients list.     - has not had any lip swelling since avoiding Paprika!   - can use allegra and pepcid for flare-ups as needed   - reserve prednisone for more severe swelling events.  If lip swelling take 2 prednisone tablets ('20mg'$  total) per day if needed   - have access to epipen device in case of allergic reaction and follow emergency action plan   Follow-up as needed (advised will be an established pt for 3 years)

## 2022-09-08 NOTE — Progress Notes (Signed)
[name] MRN: [id number] DOB: Oct 15, 1932 Date of Office Visit: 09/08/2022   History of present illness: Lindsey Erickson is a 88 y.o. female presenting today for follow-up of allergic angioedema.  She was last seen in the office on 07/02/2019 by myself.  She has not had any major health changes since the last visit.  She has been doing quite well.  She has not had any further swelling episodes since she has appropriately been avoiding paprika.  She is very pleased no swelling episodes however she states paprika seems to be in a lot of foods.  She does have access to an epinephrine device.  She cannot recall the last time she needed to take any prednisone for any swelling episodes.  She states has not been needing to take antihistamines either.    Review of systems: Review of Systems  Constitutional: Negative.   HENT: Negative.    Eyes: Negative.   Respiratory: Negative.    Cardiovascular: Negative.   Gastrointestinal: Negative.   Musculoskeletal: Negative.   Skin: Negative.   Allergic/Immunologic: Negative.   Neurological: Negative.      All other systems negative unless noted above in HPI  Past medical/social/surgical/family history have been reviewed and are unchanged unless specifically indicated below.  No changes  Medication List: Current Outpatient Medications  Medication Sig Dispense Refill   acetaminophen (TYLENOL) 500 MG tablet Take 500 mg by mouth every 6 (six) hours as needed for moderate pain.     carvedilol (COREG CR) 40 MG 24 hr capsule TAKE 1 CAPSULE EVERY EVENING 90 capsule 3   clopidogrel (PLAVIX) 75 MG tablet TAKE 1 TABLET DAILY 90 tablet 3   conjugated estrogens (PREMARIN) vaginal cream Place 1 Applicatorful vaginally 2 (two) times a week. Tuesday and Saturday.     Cyanocobalamin 2000 MCG/ML SOLN Inject as directed.     EPINEPHrine 0.3 mg/0.3 mL IJ SOAJ injection SMARTSIG:0.3 Milliliter(s) IM Once PRN 1 each 5   famotidine  (PEPCID) 20 MG tablet Take 1 tablet (20 mg total) by mouth 2 (two) times daily. 180 tablet 1   fexofenadine (ALLEGRA) 180 MG tablet Take 1 tablet (180 mg total) by mouth 2 (two) times daily. 180 tablet 5   hydrochlorothiazide (MICROZIDE) 12.5 MG capsule TAKE 1 CAPSULE DAILY (NEED APPOINTMENT) 90 capsule 3   isosorbide mononitrate (IMDUR) 60 MG 24 hr tablet TAKE 1 TABLET DAILY 90 tablet 3   Multiple Vitamins-Minerals (CENTRUM SILVER 50+WOMEN PO) Take 1 tablet by mouth daily.     nitroGLYCERIN (NITROSTAT) 0.4 MG SL tablet DISSOLVE 1 TABLET UNDER THE TONGUE EVERY 5 MINUTES AS NEEDED FOR CHEST PAIN 75 tablet 3   OVER THE COUNTER MEDICATION Take 1 capsule by mouth daily. Cranberry D - Mannose     rosuvastatin (CRESTOR) 20 MG tablet TAKE 1 TABLET AT BEDTIME 90 tablet 3   SYNTHROID 50 MCG tablet TAKE 1 TABLET ALTERNATING WITH ONE AND ONE-HALF TABLETS AS DIRECTED 108 tablet 3   trimethoprim (TRIMPEX) 100 MG tablet Take 100 mg by mouth daily.     trimethoprim-polymyxin b (POLYTRIM) ophthalmic solution Place 2 drops into the left eye every 6 (six) hours. 10 mL 0   Current Facility-Administered Medications  Medication Dose Route Frequency Provider Last Rate Last Admin   cyanocobalamin ((VITAMIN B-12)) injection 1,000 mcg  1,000 mcg Intramuscular Q30 days Susy Frizzle, MD   1,000 mcg at 05/03/22 1642   cyanocobalamin ((VITAMIN B-12)) injection 1,000 mcg  1,000 mcg Intramuscular  Once Susy Frizzle, MD         Known medication allergies: Allergies  Allergen Reactions   Ciprofloxacin Hives   Other Other (See Comments)    Frozen Blood Plasma-caused patient to crash / cardiac arrest / CAN NEVER TAKE THIS AGAIN-PER MD'S.   ALL NARCOTICS- NAUSEA AND VOMITING    Adhesive [Tape] Itching, Swelling, Rash and Other (See Comments)    Also pain at location   Codeine Nausea And Vomiting   Bactrim [Sulfamethoxazole-Trimethoprim]     Muscle aches   Cephalexin Hives   Elemental Sulfur Hives and Nausea  Only   Lisinopril Swelling   Macrobid [Nitrofurantoin Monohyd Macro] Nausea And Vomiting   Tamiflu [Oseltamivir Phosphate]      Physical examination: Blood pressure (!) 150/72, pulse 79, temperature 97.8 F (36.6 C), temperature source Temporal, resp. rate 16, weight 177 lb (80.3 kg), SpO2 96 %.  General: Alert, interactive, in no acute distress. HEENT: PERRLA, TMs pearly gray, turbinates non-edematous without discharge, post-pharynx non erythematous. Neck: Supple without lymphadenopathy. Lungs: Clear to auscultation without wheezing, rhonchi or rales. {no increased work of breathing. CV: Normal S1, S2 without murmurs. Abdomen: Nondistended, nontender. Skin: Warm and dry, without lesions or rashes. Extremities:  No clubbing, cyanosis or edema. Neuro:   Grossly intact.  Diagnositics/Labs: None today  Assessment and plan: Allergic Angioedema (lip swelling)     - avoid foods that contain Paprika in ingredients list.     - has not had any lip swelling since avoiding Paprika!   - can use allegra and pepcid for flare-ups as needed   - reserve prednisone for more severe swelling events.  If lip swelling take 2 prednisone tablets ('20mg'$  total) per day if needed   - have access to epipen device in case of allergic reaction and follow emergency action plan   Follow-up as needed (advised will be an established pt for 3 years)     I appreciate the opportunity to take part in [place] care. Please do not hesitate to contact me with questions.  Sincerely,   Prudy Feeler, MD Allergy/Immunology [facility]

## 2022-09-13 ENCOUNTER — Ambulatory Visit (INDEPENDENT_AMBULATORY_CARE_PROVIDER_SITE_OTHER): Payer: Medicare Other

## 2022-09-13 DIAGNOSIS — I10 Essential (primary) hypertension: Secondary | ICD-10-CM

## 2022-09-13 DIAGNOSIS — Z0000 Encounter for general adult medical examination without abnormal findings: Secondary | ICD-10-CM

## 2022-09-13 DIAGNOSIS — Z23 Encounter for immunization: Secondary | ICD-10-CM

## 2022-09-13 DIAGNOSIS — E538 Deficiency of other specified B group vitamins: Secondary | ICD-10-CM

## 2022-09-13 MED ORDER — CYANOCOBALAMIN 1000 MCG/ML IJ SOLN
1000.0000 ug | Freq: Once | INTRAMUSCULAR | Status: AC
  Administered 2022-09-13: 1000 ug via INTRAMUSCULAR

## 2022-10-04 ENCOUNTER — Other Ambulatory Visit: Payer: Self-pay | Admitting: Cardiovascular Disease

## 2022-10-04 ENCOUNTER — Other Ambulatory Visit: Payer: Self-pay | Admitting: Family Medicine

## 2022-10-04 NOTE — Telephone Encounter (Signed)
Courtesy refill. Patient must keep upcoming appointment for further refills. Requested Prescriptions  Pending Prescriptions Disp Refills   carvedilol (COREG CR) 40 MG 24 hr capsule [Pharmacy Med Name: CARVEDILOL ER CAPS '40MG'$ ] 90 capsule 3    Sig: TAKE 1 CAPSULE EVERY EVENING     Cardiovascular: Beta Blockers 3 Failed - 10/04/2022  2:01 AM      Failed - Last BP in normal range    BP Readings from Last 1 Encounters:  09/08/22 (!) 150/72         Failed - Valid encounter within last 6 months    Recent Outpatient Visits           6 months ago B12 deficiency   [place], [place], [place]   11 months ago B12 deficiency   [place] Eulogio Bear, NP   1 year ago General medical exam   [place] Susy Frizzle, MD   1 year ago B12 deficiency   [place] Pickard, Cammie Mcgee, MD   1 year ago B12 deficiency   [place] Pickard, Cammie Mcgee, MD              Passed - Cr in normal range and within 360 days    Creat  Date Value Ref Range Status  07/09/2022 0.90 0.60 - 0.95 mg/dL Final         Passed - AST in normal range and within 360 days    AST  Date Value Ref Range Status  07/09/2022 18 10 - 35 U/L Final         Passed - ALT in normal range and within 360 days    ALT  Date Value Ref Range Status  07/09/2022 12 6 - 29 U/L Final         Passed - Last Heart Rate in normal range    Pulse Readings from Last 1 Encounters:  09/08/22 79

## 2022-10-11 ENCOUNTER — Other Ambulatory Visit: Payer: Self-pay

## 2022-10-11 DIAGNOSIS — I2510 Atherosclerotic heart disease of native coronary artery without angina pectoris: Secondary | ICD-10-CM

## 2022-10-11 MED ORDER — CARVEDILOL PHOSPHATE ER 40 MG PO CP24: ORAL_CAPSULE | ORAL | 3 refills | Status: DC

## 2022-10-18 ENCOUNTER — Other Ambulatory Visit: Payer: Self-pay

## 2022-10-18 ENCOUNTER — Ambulatory Visit (INDEPENDENT_AMBULATORY_CARE_PROVIDER_SITE_OTHER): Payer: Medicare Other

## 2022-10-18 DIAGNOSIS — E538 Deficiency of other specified B group vitamins: Secondary | ICD-10-CM | POA: Diagnosis not present

## 2022-10-18 DIAGNOSIS — I214 Non-ST elevation (NSTEMI) myocardial infarction: Secondary | ICD-10-CM

## 2022-10-18 DIAGNOSIS — I2510 Atherosclerotic heart disease of native coronary artery without angina pectoris: Secondary | ICD-10-CM

## 2022-10-18 MED ORDER — NITROGLYCERIN 0.4 MG SL SUBL: SUBLINGUAL_TABLET | SUBLINGUAL | 3 refills | Status: DC

## 2022-10-18 MED ORDER — CYANOCOBALAMIN 1000 MCG/ML IJ SOLN
1000.0000 ug | Freq: Once | INTRAMUSCULAR | Status: AC
  Administered 2022-10-18: 1000 ug via INTRAMUSCULAR

## 2022-11-23 ENCOUNTER — Ambulatory Visit (INDEPENDENT_AMBULATORY_CARE_PROVIDER_SITE_OTHER): Payer: Medicare Other

## 2022-11-23 DIAGNOSIS — E538 Deficiency of other specified B group vitamins: Secondary | ICD-10-CM

## 2022-11-23 MED ORDER — CYANOCOBALAMIN 1000 MCG/ML IJ SOLN
1000.0000 ug | Freq: Once | INTRAMUSCULAR | Status: AC
  Administered 2022-11-23: 1000 ug via INTRAMUSCULAR

## 2022-12-27 ENCOUNTER — Ambulatory Visit (INDEPENDENT_AMBULATORY_CARE_PROVIDER_SITE_OTHER): Payer: Medicare Other

## 2022-12-27 DIAGNOSIS — E538 Deficiency of other specified B group vitamins: Secondary | ICD-10-CM

## 2022-12-27 MED ORDER — CYANOCOBALAMIN 1000 MCG/ML IJ SOLN
1000.0000 ug | Freq: Once | INTRAMUSCULAR | Status: AC
  Administered 2022-12-27: 1000 ug via INTRAMUSCULAR

## 2022-12-27 NOTE — Progress Notes (Unsigned)
Here for B12 injection. Given as per standing order. Mjp,lpn

## 2023-01-24 ENCOUNTER — Ambulatory Visit (INDEPENDENT_AMBULATORY_CARE_PROVIDER_SITE_OTHER): Payer: Medicare Other

## 2023-01-24 DIAGNOSIS — E538 Deficiency of other specified B group vitamins: Secondary | ICD-10-CM | POA: Diagnosis not present

## 2023-01-24 MED ORDER — CYANOCOBALAMIN 1000 MCG/ML IJ SOLN
1000.0000 ug | Freq: Once | INTRAMUSCULAR | Status: AC
  Administered 2023-01-24: 1000 ug via INTRAMUSCULAR

## 2023-01-27 ENCOUNTER — Other Ambulatory Visit: Payer: Self-pay | Admitting: Family Medicine

## 2023-02-11 ENCOUNTER — Other Ambulatory Visit: Payer: Self-pay | Admitting: Family Medicine

## 2023-02-11 NOTE — Telephone Encounter (Signed)
Last OV 07/12/22. Last labs 07/09/22.  Requested Prescriptions  Pending Prescriptions Disp Refills   rosuvastatin (CRESTOR) 20 MG tablet [Pharmacy Med Name: ROSUVASTATIN TABS 20MG ] 90 tablet 0    Sig: TAKE 1 TABLET AT BEDTIME     Cardiovascular:  Antilipid - Statins 2 Failed - 02/11/2023  1:23 PM      Failed - Valid encounter within last 12 months    Recent Outpatient Visits           10 months ago B12 deficiency   [place], [place], [place]   1 year ago B12 deficiency   [place] Eulogio Bear, NP   1 year ago General medical exam   [place] Susy Frizzle, MD   1 year ago B12 deficiency   [place] Pickard, Cammie Mcgee, MD   1 year ago B12 deficiency   [place], Cammie Mcgee, MD              Failed - Lipid Panel in normal range within the last 12 months    Cholesterol  Date Value Ref Range Status  07/09/2022 157 <200 mg/dL Final   LDL Cholesterol (Calc)  Date Value Ref Range Status  07/09/2022 62 mg/dL (calc) Final    Comment:    Reference range: <100 . Desirable range <100 mg/dL for primary prevention;   <70 mg/dL for patients with CHD or diabetic patients  with > or = 2 CHD risk factors. [place] Kitchen LDL-C is now calculated using the Martin-Hopkins  calculation, which is a validated novel method providing  better accuracy than the Friedewald equation in the  estimation of LDL-C.  Cresenciano Genre et al. Annamaria Helling. [id number]): 2061-2068  (http://education.[employer].com/faq/FAQ164)    Direct LDL  Date Value Ref Range Status  07/08/2014 92.7 mg/dL Final    Comment:    Optimal:  <100 mg/dLNear or Above Optimal:  100-129 mg/dLBorderline High:  130-159 mg/dLHigh:  160-189 mg/dLVery High:  >190 mg/dL   HDL  Date Value Ref Range Status  07/09/2022 74 > OR = 50 mg/dL Final   Triglycerides  Date Value Ref Range Status  07/09/2022 126 <150 mg/dL Final         Passed -  Cr in normal range and within 360 days    Creat  Date Value Ref Range Status  07/09/2022 0.90 0.60 - 0.95 mg/dL Final         Passed - Patient is not pregnant

## 2023-02-28 ENCOUNTER — Ambulatory Visit (INDEPENDENT_AMBULATORY_CARE_PROVIDER_SITE_OTHER): Payer: Medicare Other

## 2023-02-28 DIAGNOSIS — E538 Deficiency of other specified B group vitamins: Secondary | ICD-10-CM | POA: Diagnosis not present

## 2023-02-28 MED ORDER — CYANOCOBALAMIN 1000 MCG/ML IJ SOLN
1000.0000 ug | Freq: Once | INTRAMUSCULAR | Status: AC
  Administered 2023-02-28: 1000 ug via INTRAMUSCULAR

## 2023-03-02 ENCOUNTER — Other Ambulatory Visit: Payer: Self-pay | Admitting: Endocrinology

## 2023-03-02 ENCOUNTER — Other Ambulatory Visit (INDEPENDENT_AMBULATORY_CARE_PROVIDER_SITE_OTHER): Payer: Medicare Other

## 2023-03-02 DIAGNOSIS — E042 Nontoxic multinodular goiter: Secondary | ICD-10-CM

## 2023-03-02 DIAGNOSIS — R7303 Prediabetes: Secondary | ICD-10-CM | POA: Diagnosis not present

## 2023-03-02 LAB — HEMOGLOBIN A1C: Hgb A1c MFr Bld: 5.9 % (ref 4.6–6.5)

## 2023-03-02 LAB — BASIC METABOLIC PANEL
BUN: 18 mg/dL (ref 6–23)
CO2: 28 mEq/L (ref 19–32)
Calcium: 9.3 mg/dL (ref 8.4–10.5)
Chloride: 99 mEq/L (ref 96–112)
Creatinine, Ser: 0.9 mg/dL (ref 0.40–1.20)
GFR: 57.06 mL/min — ABNORMAL LOW (ref >60.00)
Glucose, Bld: 110 mg/dL — ABNORMAL HIGH (ref 70–99)
Potassium: 4.3 mEq/L (ref 3.5–5.1)
Sodium: 135 mEq/L (ref 135–145)

## 2023-03-02 LAB — T4, FREE: Free T4: 1.07 ng/dL (ref 0.60–1.60)

## 2023-03-02 LAB — TSH: TSH: 0.93 u[IU]/mL (ref 0.35–5.50)

## 2023-03-03 ENCOUNTER — Other Ambulatory Visit: Payer: Medicare Other

## 2023-03-07 ENCOUNTER — Other Ambulatory Visit: Payer: Self-pay | Admitting: Family Medicine

## 2023-03-07 ENCOUNTER — Encounter: Payer: Self-pay | Admitting: Endocrinology

## 2023-03-07 ENCOUNTER — Ambulatory Visit (INDEPENDENT_AMBULATORY_CARE_PROVIDER_SITE_OTHER): Payer: Medicare Other | Admitting: Endocrinology

## 2023-03-07 VITALS — BP 128/76 | HR 75 | Ht 66.5 in | Wt 183.4 lb

## 2023-03-07 DIAGNOSIS — R7303 Prediabetes: Secondary | ICD-10-CM

## 2023-03-07 DIAGNOSIS — E039 Hypothyroidism, unspecified: Secondary | ICD-10-CM

## 2023-03-07 DIAGNOSIS — E042 Nontoxic multinodular goiter: Secondary | ICD-10-CM

## 2023-03-07 NOTE — Progress Notes (Signed)
Patient ID: Lindsey Erickson, female   DOB: 1932/09/21, 89 y.o.   MRN: [id number]   Reason for Appointment:  Endocrinology followup     History of Present Illness:   She initially had a multinodular goiter in 1982 She has had 4 biopsies of her thyroid nodules subsequently with the last one in 2019 which were benign Her biopsies had shown a few follicular cells and lymphocytes Last biopsy showed benign hyperplastic nodule category II  Probable hypothyroidism:   Most likely she initially had been on thyroid suppression for the goiter rather than true hypothyroidism although previous records do indicate Hashimoto thyroiditis  She has been on a somewhat global dose of levothyroxine in the past       He has been on as low as 50 mcg levothyroxine dose  When she had a high normal TSH she was given additional half tablet 3 times a week of her 50 mcg levothyroxine with which she had less fatigue Currently does not feel unusually fatigued although has had a little weight gain  TSH is again quite normal  She is very regular with taking her levothyroxine regimen before breakfast  .  Lab Results  Component Value Date   TSH 0.93 03/02/2023   TSH 1.68 02/24/2022   TSH 1.83 02/02/2021   FREET4 1.07 03/02/2023   FREET4 1.12 02/24/2022   FREET4 1.05 02/02/2021    GOITER: She has had a long-standing enlargement, mostly in the midline This has been stable in size Again has no symptoms of local pressure, difficulty swallowing or choking  Her thyroid enlargement has been unchanged in the last few years  She had a biopsy done elsewhere in 07/2018 ordered by her PCP and this was again benign, diagnosis was benign hyperplastic thyroid nodule, [place] category 2  She was supposed to come back in October last year for follow-up but she did not, may have had some tendency to enlarging of the right thyroid nodule on the last visit  PREDIABETES:   Her last fasting glucose previously has  been as high as 129 in 2017 December At that time glucose tolerance test did not show her glucose to be abnormal at 2 hours although it was 204 at 1 hour  Not able to exercise much because of knee pain She is eating relatively healthy meals and not much at lunchtime along with low calorie balanced dinner without snacks  A1c has been consistently in the upper normal range, has been as low as 5.4 and now 5.9 Fasting glucose now 110, usually not higher than this in the past   Wt Readings from Last 3 Encounters:  03/07/23 183 lb 6.4 oz (83.2 kg)  09/08/22 177 lb (80.3 kg)  07/12/22 174 lb (78.9 kg)    Lab Results  Component Value Date   HGBA1C 5.9 03/02/2023   HGBA1C 6.1 02/24/2022   HGBA1C 5.8 02/02/2021   Lab Results  Component Value Date   LDLCALC 62 07/09/2022   CREATININE 0.90 03/02/2023     Allergies as of 03/07/2023       Reactions   Ciprofloxacin Hives   Other Other (See Comments)   Frozen Blood Plasma-caused patient to crash / cardiac arrest / CAN NEVER TAKE THIS AGAIN-PER MD'S.  ALL NARCOTICS- NAUSEA AND VOMITING    Adhesive [tape] Itching, Swelling, Rash, Other (See Comments)   Also pain at location   Codeine Nausea And Vomiting   Bactrim [sulfamethoxazole-trimethoprim]    Muscle aches   Cephalexin Hives  Elemental Sulfur Hives, Nausea Only   Lisinopril Swelling   Macrobid [nitrofurantoin Monohyd Macro] Nausea And Vomiting   Tamiflu [oseltamivir Phosphate]         Medication List        Accurate as of March 07, 2023  3:14 PM. If you have any questions, ask your nurse or doctor.          acetaminophen 500 MG tablet Commonly known as: TYLENOL Take 500 mg by mouth every 6 (six) hours as needed for moderate pain.   carvedilol 40 MG 24 hr capsule Commonly known as: COREG CR Take 1 capsule every evening.   CENTRUM SILVER 50+WOMEN PO Take 1 tablet by mouth daily.   clopidogrel 75 MG tablet Commonly known as: PLAVIX TAKE 1 TABLET DAILY    conjugated estrogens 0.625 MG/GM vaginal cream Commonly known as: PREMARIN Place 1 Applicatorful vaginally 2 (two) times a week. Tuesday and Saturday.   EPINEPHrine 0.3 mg/0.3 mL Soaj injection Commonly known as: EPI-PEN SMARTSIG:0.3 Milliliter(s) IM Once PRN   famotidine 20 MG tablet Commonly known as: PEPCID Take 1 tablet (20 mg total) by mouth 2 (two) times daily.   fexofenadine 180 MG tablet Commonly known as: ALLEGRA Take 1 tablet (180 mg total) by mouth 2 (two) times daily.   hydrochlorothiazide 12.5 MG capsule Commonly known as: MICROZIDE TAKE 1 CAPSULE DAILY (NEED APPOINTMENT)   isosorbide mononitrate 60 MG 24 hr tablet Commonly known as: IMDUR TAKE 1 TABLET DAILY   nitroGLYCERIN 0.4 MG SL tablet Commonly known as: NITROSTAT DISSOLVE 1 TABLET UNDER THE TONGUE EVERY 5 MINUTES AS NEEDED FOR CHEST PAIN   OVER THE COUNTER MEDICATION Take 1 capsule by mouth daily. Cranberry D - [place]   rosuvastatin 20 MG tablet Commonly known as: CRESTOR TAKE 1 TABLET AT BEDTIME   Synthroid 50 MCG tablet Generic drug: levothyroxine TAKE 1 TABLET ALTERNATING WITH ONE AND ONE-HALF TABLETS AS DIRECTED   trimethoprim 100 MG tablet Commonly known as: TRIMPEX Take 100 mg by mouth daily.   trimethoprim-polymyxin b ophthalmic solution Commonly known as: Polytrim Place 2 drops into the left eye every 6 (six) hours.        Allergies:  Allergies  Allergen Reactions   Ciprofloxacin Hives   Other Other (See Comments)    Frozen Blood Plasma-caused patient to crash / cardiac arrest / CAN NEVER TAKE THIS AGAIN-PER MD'S.   ALL NARCOTICS- NAUSEA AND VOMITING    Adhesive [Tape] Itching, Swelling, Rash and Other (See Comments)    Also pain at location   Codeine Nausea And Vomiting   Bactrim [Sulfamethoxazole-Trimethoprim]     Muscle aches   Cephalexin Hives   Elemental Sulfur Hives and Nausea Only   Lisinopril Swelling   Macrobid [Nitrofurantoin Monohyd Macro] Nausea And Vomiting    Tamiflu [Oseltamivir Phosphate]     Past Medical History:  Diagnosis Date   Anemia    Angio-edema    Arthritis    Blood transfusion without reported diagnosis 1957   CAD (coronary artery disease)    a. NSTEMI 2004 with thrombus in Cx and mod RCA. b. Nonischemic nuc 2008. c. mild NSTEMI 01/2013 (trop 0.42) -> cath with unchanged mod LAD & RCA disease, for continued medical therapy.   Cancer    Cataract    Dementia    has improved-suspect b12 deficincy and concussion were to blame   H/O Hashimoto thyroiditis    Hyperlipidemia    HYPERTENSION    a. Also has h/o intermittentl low BP.  Hypothyroidism    Kidney stones    Myocardial infarction    Osteoporosis    no fractures   Prediabetes    QT prolongation    a. Prior EKGs QTc 474, but on 4/1 was 524 for unclear reason.   Recurrent urinary tract infection    Squamous cell carcinoma of skin 04/17/2015   Urticaria     Past Surgical History:  Procedure Laterality Date   ADENOIDECTOMY     APPENDECTOMY  1999   Bilateral Arthroscopies of knees     CARDIAC CATHETERIZATION     Moderate 3 vessel CAD by cath 05/2012   CHOLECYSTECTOMY  02/2010   JOINT REPLACEMENT  01/2010   L TKR - allusio   LEFT HEART CATHETERIZATION WITH CORONARY ANGIOGRAM N/A 05/22/2012   Procedure: LEFT HEART CATHETERIZATION WITH CORONARY ANGIOGRAM;  Surgeon: Lesleigh Noe, MD;  [facility];  Service: Cardiovascular;  Laterality: N/A;   LEFT HEART CATHETERIZATION WITH CORONARY ANGIOGRAM N/A 02/20/2014   Procedure: LEFT HEART CATHETERIZATION WITH CORONARY ANGIOGRAM;  Surgeon: Peter M Swaziland, MD;  Location: [facility] CATH LAB;  Service: Cardiovascular;  Laterality: N/A;   TONSILLECTOMY     TUBAL LIGATION      Family History  Problem Relation Age of Onset   Allergies Father    Cancer Father    Coronary artery disease Mother 3   Heart disease Mother    CAD Sister 29   Cancer Sister    AAA (abdominal aortic aneurysm) Sister    Hypertension Sister     Hyperlipidemia Sister    Diabetes Sister    Heart disease Sister    Diabetes Maternal Aunt    Allergic rhinitis Neg Hx    Asthma Neg Hx    Eczema Neg Hx    Urticaria Neg Hx     Social History:  reports that she has never smoked. She has never used smokeless tobacco. She reports that she does not currently use alcohol. She reports that she does not use drugs.  REVIEW Of SYSTEMS:  History of CAD, followed regularly by cardiologist  Hypertension: Is on a 2 drug regimen, Followed by PCP and cardiologist   BP Readings from Last 3 Encounters:  03/07/23 128/76  09/08/22 (!) 150/72  07/12/22 118/62      Examination:   BP 128/76 (BP Location: Left Arm, Patient Position: Sitting, Cuff Size: Normal)   Pulse 75   Ht 5' 6.5" (1.689 m)   Wt 183 lb 6.4 oz (83.2 kg)   SpO2 96%   BMI 29.16 kg/m    Midline nodule measures about 4 cm across Right side is firm and palpable, about 2-2.5 times normal, left side not clear to palpable  Neck circumference about 44 centimeters over the thyroid  No lymphadenopathy in the neck    Assessments/Plan   Impaired fasting glucose:   She has prediabetes with A1c better at 5.9 although fasting glucose 110 She has difficulty losing weight partly because of inability to exercise Diet was reviewed and appears to be fairly good  Need to continue to monitor annually  Multinodular goiter, long-standing, on exam stable with no symptoms of dysphagia or choking History of a 5.8 cm nodule on the right side on previous ultrasounds  Again her thyroid nodule in the midline appears to be about the same size not clear if her right nodule is increasing in size Her neck circumference is larger over the last 2 visits  Will do another ultrasound and follow-up the  right nodule which was biopsied in 2019  LIPIDS: Well controlled with Crestor, followed by PCP  Mild hypothyroidism: On relatively low-dose levothyroxine supplements TSH is again normal  She is  taking brand name Synthroid 50 g, 8-1/2 tablets a week She will continue the same regimen    Reather Littler 03/07/2023, 3:14 PM

## 2023-03-08 NOTE — Telephone Encounter (Signed)
Requested medications are due for refill today.  yes  Requested medications are on the active medications list.  yes  Last refill. 03/11/2022 #90 3rf  Future visit scheduled.   Yes - in August  Notes to clinic.  Please review for refill.    Requested Prescriptions  Pending Prescriptions Disp Refills   clopidogrel (PLAVIX) 75 MG tablet [Pharmacy Med Name: CLOPIDOGREL BISULFATE TABS 75MG ] 90 tablet 3    Sig: TAKE 1 TABLET DAILY     Hematology: Antiplatelets - clopidogrel Failed - 03/07/2023  3:05 AM      Failed - HCT in normal range and within 180 days    HCT  Date Value Ref Range Status  07/09/2022 38.9 35.0 - 45.0 % Final         Failed - HGB in normal range and within 180 days    Hemoglobin  Date Value Ref Range Status  07/09/2022 13.5 11.7 - 15.5 g/dL Final         Failed - PLT in normal range and within 180 days    Platelets  Date Value Ref Range Status  07/09/2022 217 140 - 400 Thousand/uL Final         Failed - Valid encounter within last 6 months    Recent Outpatient Visits           11 months ago B12 deficiency   [facility] Arta Silence, CMA   1 year ago B12 deficiency   [facility] Valentino Nose, NP   1 year ago General medical exam   [facility] Donita Brooks, MD   1 year ago B12 deficiency   [facility] Pickard, Priscille Heidelberg, MD   1 year ago B12 deficiency   [facility] Pickard, Priscille Heidelberg, MD       Future Appointments             In 4 months Pickard, Priscille Heidelberg, MD [facility]   In 4 months Runell Gess, MD [facility] - Cr in normal range and within 360 days    Creat  Date Value Ref Range Status  07/09/2022 0.90 0.60 - 0.95 mg/dL Final   Creatinine, Ser  Date Value Ref Range Status  03/02/2023 0.90 0.40 - 1.20 mg/dL Final

## 2023-03-23 ENCOUNTER — Other Ambulatory Visit: Payer: Self-pay | Admitting: Endocrinology

## 2023-03-30 ENCOUNTER — Ambulatory Visit: Payer: Medicare Other

## 2023-03-30 DIAGNOSIS — E538 Deficiency of other specified B group vitamins: Secondary | ICD-10-CM

## 2023-03-30 MED ORDER — CYANOCOBALAMIN 1000 MCG/ML IJ SOLN
1000.0000 ug | Freq: Once | INTRAMUSCULAR | Status: DC
Start: 2023-03-30 — End: 2023-05-02

## 2023-03-30 NOTE — Progress Notes (Signed)
Give b12injction to pt on the L-upper arm. Pt tol well, no c/o per pt. Pt left ambulatory w/no c/o

## 2023-03-31 ENCOUNTER — Other Ambulatory Visit: Payer: Medicare Other

## 2023-04-25 ENCOUNTER — Ambulatory Visit
Admission: RE | Admit: 2023-04-25 | Discharge: 2023-04-25 | Disposition: A | Discharge status: Home / Self Care | Payer: Medicare Other | Source: Ambulatory Visit | Attending: Endocrinology | Admitting: Endocrinology

## 2023-04-25 DIAGNOSIS — E042 Nontoxic multinodular goiter: Secondary | ICD-10-CM

## 2023-04-25 NOTE — Progress Notes (Signed)
Please call to let patient know that the ultrasound results are showing stable thyroid nodule and no further action needed

## 2023-05-02 ENCOUNTER — Ambulatory Visit (INDEPENDENT_AMBULATORY_CARE_PROVIDER_SITE_OTHER): Payer: Medicare Other

## 2023-05-02 VITALS — Wt 183.0 lb

## 2023-05-02 DIAGNOSIS — E538 Deficiency of other specified B group vitamins: Secondary | ICD-10-CM

## 2023-05-02 MED ORDER — CYANOCOBALAMIN 1000 MCG/ML IJ SOLN
1000.0000 ug | Freq: Once | INTRAMUSCULAR | Status: AC
Start: 2023-05-02 — End: 2023-05-02
  Administered 2023-05-02: 1000 ug via INTRAMUSCULAR

## 2023-05-12 ENCOUNTER — Telehealth: Payer: Self-pay

## 2023-05-12 ENCOUNTER — Other Ambulatory Visit: Payer: Self-pay

## 2023-05-12 DIAGNOSIS — I2510 Atherosclerotic heart disease of native coronary artery without angina pectoris: Secondary | ICD-10-CM

## 2023-05-12 DIAGNOSIS — E7800 Pure hypercholesterolemia, unspecified: Secondary | ICD-10-CM

## 2023-05-12 DIAGNOSIS — I214 Non-ST elevation (NSTEMI) myocardial infarction: Secondary | ICD-10-CM

## 2023-05-12 MED ORDER — ROSUVASTATIN CALCIUM 20 MG PO TABS
20.0000 mg | ORAL_TABLET | Freq: Every day | ORAL | 0 refills | Status: DC
Start: 2023-05-12 — End: 2023-07-26

## 2023-05-12 NOTE — Telephone Encounter (Signed)
Prescription Request  05/12/2023  LOV: 07/12/22  What is the name of the medication or equipment? rosuvastatin (CRESTOR) 20 MG tablet [[id number]]  Have you contacted your pharmacy to request a refill? Yes   Which pharmacy would you like this sent to?  [facility] - [place], [place] - [address] [place] [address] Phone: [phone] Fax: [phone]    Patient notified that their request is being sent to the clinical staff for review and that they should receive a response within 2 business days.   Please advise at [facility] [phone]

## 2023-06-06 ENCOUNTER — Ambulatory Visit (INDEPENDENT_AMBULATORY_CARE_PROVIDER_SITE_OTHER): Payer: Medicare Other

## 2023-06-06 VITALS — Ht 65.5 in | Wt 183.0 lb

## 2023-06-06 DIAGNOSIS — E538 Deficiency of other specified B group vitamins: Secondary | ICD-10-CM

## 2023-06-06 MED ORDER — CYANOCOBALAMIN 1000 MCG/ML IJ SOLN
1000.0000 ug | Freq: Once | INTRAMUSCULAR | Status: AC
Start: 2023-06-06 — End: 2023-06-06
  Administered 2023-06-06: 1000 ug via INTRAMUSCULAR

## 2023-06-21 ENCOUNTER — Other Ambulatory Visit: Payer: Self-pay | Admitting: Endocrinology

## 2023-06-30 ENCOUNTER — Other Ambulatory Visit: Payer: Medicare Other

## 2023-07-11 ENCOUNTER — Ambulatory Visit (INDEPENDENT_AMBULATORY_CARE_PROVIDER_SITE_OTHER): Payer: Medicare Other

## 2023-07-11 ENCOUNTER — Other Ambulatory Visit: Payer: Self-pay

## 2023-07-11 DIAGNOSIS — E538 Deficiency of other specified B group vitamins: Secondary | ICD-10-CM

## 2023-07-11 MED ORDER — CYANOCOBALAMIN 1000 MCG/ML IJ SOLN
1000.0000 ug | Freq: Once | INTRAMUSCULAR | Status: AC
Start: 2023-07-11 — End: 2023-07-11
  Administered 2023-07-11: 1000 ug via INTRAMUSCULAR

## 2023-07-11 NOTE — Progress Notes (Signed)
Pt here for B12 injct given on the L-upper arm. Pt tol inj well and let ambulatory w/no c/o.

## 2023-07-12 ENCOUNTER — Other Ambulatory Visit: Payer: Self-pay

## 2023-07-12 MED ORDER — HYDROCHLOROTHIAZIDE 12.5 MG PO CAPS: 12.5000 mg | ORAL_CAPSULE | Freq: Every day | ORAL | 1 refills | Status: DC

## 2023-07-13 ENCOUNTER — Other Ambulatory Visit: Payer: Self-pay | Admitting: Family Medicine

## 2023-07-13 DIAGNOSIS — I2510 Atherosclerotic heart disease of native coronary artery without angina pectoris: Secondary | ICD-10-CM

## 2023-07-13 NOTE — Telephone Encounter (Signed)
Prescription Request  07/13/2023  LOV: 07/12/2022 What is the name of the medication or equipment?   carvedilol (COREG CR) 40 MG 24 hr capsule  **90 day supply requested**  Have you contacted your pharmacy to request a refill? Yes   Which pharmacy would you like this sent to?  [facility] - [place], [place] - [address] [place] [address] Phone: [phone] Fax: [phone]    Patient notified that their request is being sent to the clinical staff for review and that they should receive a response within 2 business days.   Please advise pharmacist.

## 2023-07-13 NOTE — Patient Instructions (Signed)
Lindsey Erickson , Thank you for taking time to come for your Medicare Wellness Visit. I appreciate your ongoing commitment to your health goals. Please review the following plan we discussed and let me know if I can assist you in the future.   Referrals/Orders/Follow-Ups/Clinician Recommendations: Aim for 30 minutes of exercise or brisk walking, 6-8 glasses of water, and 5 servings of fruits and vegetables each day.  This is a list of the screening recommended for you and due dates:  Health Maintenance  Topic Date Due   Zoster (Shingles) Vaccine (1 of 2) 08/07/1951   COVID-19 Vaccine (5 - 2023-24 season) 07/23/2022   Flu Shot  06/23/2023   Medicare Annual Wellness Visit  07/13/2024   DTaP/Tdap/Td vaccine (2 - Td or Tdap) 04/15/2030   Pneumonia Vaccine  Completed   DEXA scan (bone density measurement)  Completed   HPV Vaccine  Aged Out    Advanced directives: (ACP Link)Information on Advanced Care Planning can be found at [facility] Advance Health Care Directives Advance Health Care Directives ([email or web address]   Next Medicare Annual Wellness Visit scheduled for next year: Yes

## 2023-07-14 ENCOUNTER — Telehealth: Payer: Self-pay

## 2023-07-14 ENCOUNTER — Ambulatory Visit (INDEPENDENT_AMBULATORY_CARE_PROVIDER_SITE_OTHER): Payer: Medicare Other

## 2023-07-14 VITALS — Ht 65.5 in | Wt 183.0 lb

## 2023-07-14 DIAGNOSIS — Z0000 Encounter for general adult medical examination without abnormal findings: Secondary | ICD-10-CM

## 2023-07-14 MED ORDER — CYANOCOBALAMIN 1000 MCG/ML IJ SOLN
1000.0000 ug | Freq: Once | INTRAMUSCULAR | Status: DC
Start: 2023-07-14 — End: 2023-07-18

## 2023-07-14 NOTE — Progress Notes (Signed)
Subjective:   Lindsey Erickson is a 89 y.o. female who presents for Medicare Annual (Subsequent) preventive examination.  Visit Complete: Virtual  I connected with  Lindsey Erickson on 07/14/23 by a audio enabled telemedicine application and verified that I am speaking with the correct person using two identifiers.  Patient Location: Home  Provider Location: Home Office  I discussed the limitations of evaluation and management by telemedicine. The patient expressed understanding and agreed to proceed.  Vital Signs: Because this visit was a virtual/telehealth visit, some criteria may be missing or patient reported. Any vitals not documented were not able to be obtained and vitals that have been documented are patient reported.   Review of Systems     Cardiac Risk Factors include: advanced age (>90men, >19 women);dyslipidemia;hypertension     Objective:    Today's Vitals   07/14/23 1737  Weight: 183 lb (83 kg)  Height: 5' 5.5" (1.664 m)   Body mass index is 29.99 kg/m.     07/14/2023    5:42 PM 07/10/2021    8:20 AM 07/08/2020    3:27 PM 04/30/2020    5:46 PM 12/14/2018    6:24 AM 09/18/2017    1:48 PM 12/21/2014    1:01 AM  Advanced Directives  Does Patient Have a Medical Advance Directive? Yes Yes Yes No No Yes No  Type of Estate agent of [place];Living will Healthcare Power of [employer] of [place];Living will   Living will   Does patient want to make changes to medical advance directive? No - Patient declined No - Patient declined       Copy of Healthcare Power of Attorney in Chart? Yes - validated most recent copy scanned in chart (See row information) No - copy requested       Would patient like information on creating a medical advance directive?    No - Patient declined   No - patient declined information    Current Medications (verified) Outpatient Encounter Medications as of 07/14/2023  Medication Sig   acetaminophen  (TYLENOL) 500 MG tablet Take 500 mg by mouth every 6 (six) hours as needed for moderate pain.   carvedilol (COREG CR) 40 MG 24 hr capsule Take 1 capsule every evening.   clopidogrel (PLAVIX) 75 MG tablet TAKE 1 TABLET DAILY   conjugated estrogens (PREMARIN) vaginal cream Place 1 Applicatorful vaginally 2 (two) times a week. Tuesday and Saturday.   EPINEPHrine 0.3 mg/0.3 mL IJ SOAJ injection SMARTSIG:0.3 Milliliter(s) IM Once PRN   famotidine (PEPCID) 20 MG tablet Take 1 tablet (20 mg total) by mouth 2 (two) times daily.   fexofenadine (ALLEGRA) 180 MG tablet Take 1 tablet (180 mg total) by mouth 2 (two) times daily.   hydrochlorothiazide (MICROZIDE) 12.5 MG capsule Take 1 capsule (12.5 mg total) by mouth daily.   isosorbide mononitrate (IMDUR) 60 MG 24 hr tablet TAKE 1 TABLET DAILY   Multiple Vitamins-Minerals (CENTRUM SILVER 50+WOMEN PO) Take 1 tablet by mouth daily.   nitroGLYCERIN (NITROSTAT) 0.4 MG SL tablet DISSOLVE 1 TABLET UNDER THE TONGUE EVERY 5 MINUTES AS NEEDED FOR CHEST PAIN   OVER THE COUNTER MEDICATION Take 1 capsule by mouth daily. Cranberry D - [place]   rosuvastatin (CRESTOR) 20 MG tablet Take 1 tablet (20 mg total) by mouth at bedtime.   SYNTHROID 50 MCG tablet TAKE 1 TABLET ALTERNATING WITH ONE AND ONE-HALF TABLETS AS DIRECTED   trimethoprim (TRIMPEX) 100 MG tablet Take 100 mg by mouth daily.  trimethoprim-polymyxin b (POLYTRIM) ophthalmic solution Place 2 drops into the left eye every 6 (six) hours.   No facility-administered encounter medications on file as of 07/14/2023.    Allergies (verified) Ciprofloxacin, Other, Adhesive [tape], Codeine, Bactrim [sulfamethoxazole-trimethoprim], Cephalexin, Elemental sulfur, Lisinopril, Macrobid [nitrofurantoin monohyd macro], and Tamiflu [oseltamivir phosphate]   History: Past Medical History:  Diagnosis Date   Anemia    Angio-edema    Arthritis    Blood transfusion without reported diagnosis 1957   CAD (coronary artery  disease)    a. NSTEMI 2004 with thrombus in Cx and mod RCA. b. Nonischemic nuc 2008. c. mild NSTEMI 01/2013 (trop 0.42) -> cath with unchanged mod LAD & RCA disease, for continued medical therapy.   Cancer (HCC)    Cataract    Dementia (HCC)    has improved-suspect b12 deficincy and concussion were to blame   H/O Hashimoto thyroiditis    Hyperlipidemia    HYPERTENSION    a. Also has h/o intermittentl low BP.   Hypothyroidism    Kidney stones    Myocardial infarction [facility])    Osteoporosis    no fractures   Prediabetes    QT prolongation    a. Prior EKGs QTc 474, but on 4/1 was 524 for unclear reason.   Recurrent urinary tract infection    Squamous cell carcinoma of skin 04/17/2015   Urticaria    Past Surgical History:  Procedure Laterality Date   ADENOIDECTOMY     APPENDECTOMY  1999   Bilateral Arthroscopies of knees     CARDIAC CATHETERIZATION     Moderate 3 vessel CAD by cath 05/2012   CHOLECYSTECTOMY  02/2010   JOINT REPLACEMENT  01/2010   L TKR - allusio   LEFT HEART CATHETERIZATION WITH CORONARY ANGIOGRAM N/A 05/22/2012   Procedure: LEFT HEART CATHETERIZATION WITH CORONARY ANGIOGRAM;  Surgeon: Lesleigh Noe, MD;  [facility] C[facility] LAB;  Service: Cardiovascular;  Laterality: N/A;   LEFT HEART CATHETERIZATION WITH CORONARY ANGIOGRAM N/A 02/20/2014   Procedure: LEFT HEART CATHETERIZATION WITH CORONARY ANGIOGRAM;  Surgeon: Peter M Swaziland, MD;  Location: [facility] CATH LAB;  Service: Cardiovascular;  Laterality: N/A;   TONSILLECTOMY     TUBAL LIGATION     Family History  Problem Relation Age of Onset   Allergies Father    Cancer Father    Coronary artery disease Mother 70   Heart disease Mother    CAD Sister 101   Cancer Sister    AAA (abdominal aortic aneurysm) Sister    Hypertension Sister    Hyperlipidemia Sister    Diabetes Sister    Heart disease Sister    Diabetes Maternal Aunt    Allergic rhinitis Neg Hx    Asthma Neg Hx    Eczema Neg Hx    Urticaria Neg Hx     Social History   Socioeconomic History   Marital status: Married    Spouse name: Not on file   Number of children: Not on file   Years of education: Not on file   Highest education level: Not on file  Occupational History   Not on file  Tobacco Use   Smoking status: Never   Smokeless tobacco: Never  Vaping Use   Vaping status: Never Used  Substance and Sexual Activity   Alcohol use: Not Currently   Drug use: Never   Sexual activity: Not Currently  Other Topics Concern   Not on file  Social History Narrative   ** Merged History Encounter **  Married, 2 children. Retired from [employer]   Social Determinants of Health   Financial Resource Strain: Low Risk  (07/14/2023)   Overall Financial Resource Strain (CARDIA)    Difficulty of Paying Living Expenses: Not hard at all  Food Insecurity: No Food Insecurity (07/14/2023)   Hunger Vital Sign    Worried About Running Out of Food in the Last Year: Never true    Ran Out of Food in the Last Year: Never true  Transportation Needs: No Transportation Needs (07/14/2023)   PRAPARE - Administrator, Civil Service (Medical): No    Lack of Transportation (Non-Medical): No  Physical Activity: Insufficiently Active (07/14/2023)   Exercise Vital Sign    Days of Exercise per Week: 3 days    Minutes of Exercise per Session: 30 min  Stress: No Stress Concern Present (07/14/2023)   [employer] of Occupational Health - Occupational Stress Questionnaire    Feeling of Stress : Not at all  Social Connections: Socially Integrated (07/14/2023)   Social Connection and Isolation Panel [NHANES]    Frequency of Communication with Friends and Family: More than three times a week    Frequency of Social Gatherings with Friends and Family: Three times a week    Attends Religious Services: More than 4 times per year    Active Member of Clubs or Organizations: Yes    Attends Banker Meetings: 1 to 4 times per year     Marital Status: Married    Tobacco Counseling Counseling given: Not Answered   Clinical Intake:  Pre-visit preparation completed: Yes  Pain : No/denies pain     Diabetes: No  How often do you need to have someone help you when you read instructions, pamphlets, or other written materials from your doctor or pharmacy?: 1 - Never  Interpreter Needed?: No  Information entered by :: Kandis Fantasia LPN   Activities of Daily Living    07/14/2023    5:41 PM  In your present state of health, do you have any difficulty performing the following activities:  Hearing? 0  Vision? 0  Difficulty concentrating or making decisions? 0  Walking or climbing stairs? 0  Dressing or bathing? 0  Doing errands, shopping? 0  Preparing Food and eating ? N  Using the Toilet? N  In the past six months, have you accidently leaked urine? N  Do you have problems with loss of bowel control? N  Managing your Medications? N  Managing your Finances? N  Housekeeping or managing your Housekeeping? N    Patient Care Team: Donita Brooks, MD as PCP - General (Family Medicine) Runell Gess, MD as PCP - Cardiology (Cardiology) Reather Littler, MD as Consulting Physician (Endocrinology) Ollen Gross, MD as Consulting Physician (Orthopedic Surgery) Ermalinda Barrios, MD (Otolaryngology) Donita Brooks, MD (Family Medicine)  Indicate any recent Medical Services you may have received from other than [facility] providers in the past year (date may be approximate).     Assessment:   This is a routine wellness examination for Gabbi.  Hearing/Vision screen Hearing Screening - Comments:: Denies hearing difficulties   Vision Screening - Comments:: up to date with routine eye exams with    Dietary issues and exercise activities discussed:     Goals Addressed             This Visit's Progress    Remain active and independent        Depression Screen    07/14/2023  5:40 PM 07/12/2022    8:15 AM  07/10/2021    8:21 AM 07/08/2020    3:27 PM 06/28/2019   10:09 AM 04/13/2018   10:55 AM 04/13/2018   10:12 AM  PHQ 2/9 Scores  PHQ - 2 Score 0 0 0 0 0 0 0    Fall Risk    07/14/2023    5:41 PM 07/10/2021    8:20 AM 07/08/2020    3:27 PM 06/28/2019   10:09 AM 04/13/2018   10:55 AM  Fall Risk   Falls in the past year? 0 0 1 0 No  Number falls in past yr: 0 0 0    Injury with Fall? 0 0 1    Risk for fall due to : No Fall Risks No Fall Risks     Follow up Falls prevention discussed;Education provided;Falls evaluation completed Falls evaluation completed  Falls evaluation completed     MEDICARE RISK AT HOME: Medicare Risk at Home Any stairs in or around the home?: No If so, are there any without handrails?: No Home free of loose throw rugs in walkways, pet beds, electrical cords, etc?: Yes Adequate lighting in your home to reduce risk of falls?: Yes Life alert?: No Use of a cane, walker or w/c?: No Grab bars in the bathroom?: Yes Shower chair or bench in shower?: Yes Elevated toilet seat or a handicapped toilet?: Yes  TIMED UP AND GO:  Was the test performed?  No    Cognitive Function:    05/15/2020    4:52 PM  MMSE - Mini Mental State Exam  Orientation to time 5  Orientation to Place 5  Registration 3  Attention/ Calculation 0  Recall 1  Language- name 2 objects 2  Language- repeat 1  Language- follow 3 step command 3  Language- read & follow direction 1  Write a sentence 1  Copy design 1  Total score 23        07/14/2023    5:42 PM 07/08/2020    3:28 PM  6CIT Screen  What Year? 0 points 0 points  What month? 0 points 0 points  What time? 0 points 0 points  Count back from 20 0 points 0 points  Months in reverse 0 points 0 points  Repeat phrase 2 points 0 points  Total Score 2 points 0 points    Immunizations Immunization History  Administered Date(s) Administered   Fluad Quad(high Dose 65+) 08/02/2019, 08/17/2021, 09/13/2022   Influenza Whole 07/23/2009    Influenza,inj,Quad PF,6+ Mos 09/22/2015, 08/17/2016, 08/24/2017, 08/16/2018   Influenza-Unspecified 08/22/2013, 09/02/2014   PFIZER Comirnaty(Gray Top)Covid-19 Tri-Sucrose Vaccine 03/30/2021   PFIZER(Purple Top)SARS-COV-2 Vaccination 12/17/2019, 01/07/2020, 08/25/2020   Pneumococcal Conjugate-13 01/14/2014   Pneumococcal Polysaccharide-23 02/21/2012   Tdap 04/15/2020   Zoster, Live 09/23/2006    TDAP status: Up to date  Flu Vaccine status: Due, Education has been provided regarding the importance of this vaccine. Advised may receive this vaccine at local pharmacy or Health Dept. Aware to provide a copy of the vaccination record if obtained from local pharmacy or Health Dept. Verbalized acceptance and understanding.  Pneumococcal vaccine status: Up to date  Covid-19 vaccine status: Information provided on how to obtain vaccines.   Qualifies for Shingles Vaccine? Yes   Zostavax completed Yes   Shingrix Completed?: No.    Education has been provided regarding the importance of this vaccine. Patient has been advised to call insurance company to determine out of pocket expense if they have not yet  received this vaccine. Advised may also receive vaccine at local pharmacy or Health Dept. Verbalized acceptance and understanding.  Screening Tests Health Maintenance  Topic Date Due   Zoster Vaccines- Shingrix (1 of 2) 08/07/1951   COVID-19 Vaccine (5 - 2023-24 season) 07/23/2022   INFLUENZA VACCINE  06/23/2023   Medicare Annual Wellness (AWV)  07/13/2024   DTaP/Tdap/Td (2 - Td or Tdap) 04/15/2030   Pneumonia Vaccine 59+ Years old  Completed   DEXA SCAN  Completed   HPV VACCINES  Aged Out    Health Maintenance  Health Maintenance Due  Topic Date Due   Zoster Vaccines- Shingrix (1 of 2) 08/07/1951   COVID-19 Vaccine (5 - 2023-24 season) 07/23/2022   INFLUENZA VACCINE  06/23/2023    Colorectal cancer screening: No longer required.   Mammogram status: No longer required due to age and  preference.  Bone Density status: Completed 04/27/17. Results reflect: Bone density results: OSTEOPOROSIS. Repeat every 2 years.  Lung Cancer Screening: (Low Dose CT Chest recommended if Age 7-80 years, 20 pack-year currently smoking OR have quit w/in 15years.) does not qualify.   Lung Cancer Screening Referral: n/a  Additional Screening:  Hepatitis C Screening: does not qualify  Vision Screening: Recommended annual ophthalmology exams for early detection of glaucoma and other disorders of the eye. Is the patient up to date with their annual eye exam?  Yes  Who is the provider or what is the name of the office in which the patient attends annual eye exams? Unable to provide name If pt is not established with a provider, would they like to be referred to a provider to establish care? No .   Dental Screening: Recommended annual dental exams for proper oral hygiene  Community Resource Referral / Chronic Care Management: CRR required this visit?  No   CCM required this visit?  No     Plan:     I have personally reviewed and noted the following in the patient's chart:   Medical and social history Use of alcohol, tobacco or illicit drugs  Current medications and supplements including opioid prescriptions. Patient is not currently taking opioid prescriptions. Functional ability and status Nutritional status Physical activity Advanced directives List of other physicians Hospitalizations, surgeries, and ER visits in previous 12 months Vitals Screenings to include cognitive, depression, and falls Referrals and appointments  In addition, I have reviewed and discussed with patient certain preventive protocols, quality metrics, and best practice recommendations. A written personalized care plan for preventive services as well as general preventive health recommendations were provided to patient.     Kandis Fantasia [place], [place]   07/20/5620   After Visit Summary: (Mail) Due to  this being a telephonic visit, the after visit summary with patients personalized plan was offered to patient via mail   Nurse Notes: No concerns at this time

## 2023-07-14 NOTE — Telephone Encounter (Signed)
Requested Prescriptions  Pending Prescriptions Disp Refills   carvedilol (COREG CR) 40 MG 24 hr capsule 90 capsule 3    Sig: Take 1 capsule every evening.     Cardiovascular: Beta Blockers 3 Failed - 07/13/2023  9:19 AM      Failed - AST in normal range and within 360 days    AST  Date Value Ref Range Status  07/09/2022 18 10 - 35 U/L Final         Failed - ALT in normal range and within 360 days    ALT  Date Value Ref Range Status  07/09/2022 12 6 - 29 U/L Final         Failed - Valid encounter within last 6 months    Recent Outpatient Visits           1 year ago B12 deficiency   [facility] Arta Silence, CMA   1 year ago B12 deficiency   [facility] Valentino Nose, NP   2 years ago General medical exam   [facility] Donita Brooks, MD   2 years ago B12 deficiency   [facility] Pickard, Priscille Heidelberg, MD   2 years ago B12 deficiency   [facility] Pickard, Priscille Heidelberg, MD       Future Appointments             In 5 days Pickard, Priscille Heidelberg, MD [facility]   In 2 months Allyson Sabal, Delton See, MD [facility] - Cr in normal range and within 360 days    Creat  Date Value Ref Range Status  07/09/2022 0.90 0.60 - 0.95 mg/dL Final   Creatinine, Ser  Date Value Ref Range Status  03/02/2023 0.90 0.40 - 1.20 mg/dL Final         Passed - Last BP in normal range    BP Readings from Last 1 Encounters:  03/07/23 128/76         Passed - Last Heart Rate in normal range    Pulse Readings from Last 1 Encounters:  03/07/23 75

## 2023-07-14 NOTE — Addendum Note (Signed)
Addended by: Arta Silence on: 07/14/2023 04:41 PM   Modules accepted: Orders

## 2023-07-15 ENCOUNTER — Other Ambulatory Visit: Payer: Medicare Other

## 2023-07-15 DIAGNOSIS — I10 Essential (primary) hypertension: Secondary | ICD-10-CM

## 2023-07-15 DIAGNOSIS — E039 Hypothyroidism, unspecified: Secondary | ICD-10-CM

## 2023-07-15 DIAGNOSIS — D519 Vitamin B12 deficiency anemia, unspecified: Secondary | ICD-10-CM

## 2023-07-15 DIAGNOSIS — E538 Deficiency of other specified B group vitamins: Secondary | ICD-10-CM

## 2023-07-15 DIAGNOSIS — E7800 Pure hypercholesterolemia, unspecified: Secondary | ICD-10-CM

## 2023-07-16 LAB — COMPLETE METABOLIC PANEL WITH GFR
AG Ratio: 1.6 (calc) (ref 1.0–2.5)
ALT: 12 U/L (ref 6–29)
AST: 18 U/L (ref 10–35)
Albumin: 3.9 g/dL (ref 3.6–5.1)
Alkaline phosphatase (APISO): 63 U/L (ref 37–153)
BUN: 12 mg/dL (ref 7–25)
CO2: 26 mmol/L (ref 20–32)
Calcium: 9.3 mg/dL (ref 8.6–10.4)
Chloride: 99 mmol/L (ref 98–110)
Creat: 0.83 mg/dL (ref 0.60–0.95)
Globulin: 2.5 g/dL (ref 1.9–3.7)
Glucose, Bld: 106 mg/dL — ABNORMAL HIGH (ref 65–99)
Potassium: 4.4 mmol/L (ref 3.5–5.3)
Sodium: 136 mmol/L (ref 135–146)
Total Bilirubin: 0.5 mg/dL (ref 0.2–1.2)
Total Protein: 6.4 g/dL (ref 6.1–8.1)
eGFR: 68 mL/min/{1.73_m2} (ref >=60)

## 2023-07-16 LAB — CBC WITH DIFFERENTIAL/PLATELET
Absolute Monocytes: 463 {cells}/uL (ref 200–950)
Basophils Absolute: 52 {cells}/uL (ref 0–200)
Basophils Relative: 1 %
Eosinophils Absolute: 182 {cells}/uL (ref 15–500)
Eosinophils Relative: 3.5 %
HCT: 39.1 % (ref 35.0–45.0)
Hemoglobin: 13 g/dL (ref 11.7–15.5)
Lymphs Abs: 1212 {cells}/uL (ref 850–3900)
MCH: 33.4 pg — ABNORMAL HIGH (ref 27.0–33.0)
MCHC: 33.2 g/dL (ref 32.0–36.0)
MCV: 100.5 fL — ABNORMAL HIGH (ref 80.0–100.0)
MPV: 10.1 fL (ref 7.5–12.5)
Monocytes Relative: 8.9 %
Neutro Abs: 3292 {cells}/uL (ref 1500–7800)
Neutrophils Relative %: 63.3 %
Platelets: 216 10*3/uL (ref 140–400)
RBC: 3.89 10*6/uL (ref 3.80–5.10)
RDW: 11.9 % (ref 11.0–15.0)
Total Lymphocyte: 23.3 %
WBC: 5.2 10*3/uL (ref 3.8–10.8)

## 2023-07-16 LAB — LIPID PANEL
Cholesterol: 142 mg/dL (ref <200)
HDL: 69 mg/dL (ref >=50)
LDL Cholesterol (Calc): 55 mg/dL
Non-HDL Cholesterol (Calc): 73 mg/dL (ref <130)
Total CHOL/HDL Ratio: 2.1 (calc) (ref <5.0)
Triglycerides: 94 mg/dL (ref <150)

## 2023-07-16 LAB — TSH: TSH: 2.3 m[IU]/L (ref 0.40–4.50)

## 2023-07-18 NOTE — Addendum Note (Signed)
Addended by: Arta Silence on: 07/18/2023 04:25 PM   Modules accepted: Orders

## 2023-07-19 ENCOUNTER — Ambulatory Visit (INDEPENDENT_AMBULATORY_CARE_PROVIDER_SITE_OTHER): Payer: Medicare Other | Admitting: Family Medicine

## 2023-07-19 ENCOUNTER — Encounter: Payer: Self-pay | Admitting: Family Medicine

## 2023-07-19 VITALS — BP 138/76 | HR 82 | Temp 98.2°F | Ht 65.5 in | Wt 185.4 lb

## 2023-07-19 DIAGNOSIS — I2510 Atherosclerotic heart disease of native coronary artery without angina pectoris: Secondary | ICD-10-CM

## 2023-07-19 DIAGNOSIS — E538 Deficiency of other specified B group vitamins: Secondary | ICD-10-CM | POA: Diagnosis not present

## 2023-07-19 DIAGNOSIS — Z0000 Encounter for general adult medical examination without abnormal findings: Secondary | ICD-10-CM

## 2023-07-19 DIAGNOSIS — E7800 Pure hypercholesterolemia, unspecified: Secondary | ICD-10-CM | POA: Diagnosis not present

## 2023-07-19 DIAGNOSIS — E042 Nontoxic multinodular goiter: Secondary | ICD-10-CM | POA: Diagnosis not present

## 2023-07-19 NOTE — Progress Notes (Signed)
Subjective:    Patient ID: Lindsey Erickson, female    DOB: 1932-03-05, 89 y.o.   MRN: [id number]  HPI   Patient is a very pleasant 89 year old white female who is here for CPE. She has a significant past medical history of coronary artery disease. According to her report she is suffered 2 separate myocardial infarctions. However on repeat catheterizations, the patient states that she has lesions which are not amenable to stenting. Therefore she is being managed medically. She is currently on a long-acting nitroglycerin, beta blocker, and ACE inhibitor, statin medication, and dual antiplatelet therapy.  Unfortunately, since I last saw the patient, her daughter passed away due to liver cancer specifically cholangiocarcinoma.  Patient seems to be doing remarkably well despite this tragedy.  She denies any depression.  She denies any memory loss.  Her memory has recovered dramatically since starting parenteral vitamin B12.  She denies any falls.  She is due for shingles vaccine, flu shot, COVID-vaccine, and RSV vaccine.  With all of the shots today.  Given her age she does not require Pap smear or colonoscopy.  She declines a mammogram.  She does have a history of borderline osteoporosis.  In the past she took Actonel.  She is overdue to repeat a bone density test.  She declines this today.  She is taking calcium and vitamin D.  I encouraged her to reconsider the bone density test but she politely declines at the present time Past Medical History:  Diagnosis Date   Anemia    Angio-edema    Arthritis    Blood transfusion without reported diagnosis 1957   CAD (coronary artery disease)    a. NSTEMI 2004 with thrombus in Cx and mod RCA. b. Nonischemic nuc 2008. c. mild NSTEMI 01/2013 (trop 0.42) -> cath with unchanged mod LAD & RCA disease, for continued medical therapy.   Cancer (HCC)    Cataract    Dementia (HCC)    has improved-suspect b12 deficincy and concussion were to blame   H/O Hashimoto  thyroiditis    Hyperlipidemia    HYPERTENSION    a. Also has h/o intermittentl low BP.   Hypothyroidism    Kidney stones    Myocardial infarction [facility])    Osteoporosis    no fractures   Prediabetes    QT prolongation    a. Prior EKGs QTc 474, but on 4/1 was 524 for unclear reason.   Recurrent urinary tract infection    Squamous cell carcinoma of skin 04/17/2015   Urticaria    Past Surgical History:  Procedure Laterality Date   ADENOIDECTOMY     APPENDECTOMY  1999   Bilateral Arthroscopies of knees     CARDIAC CATHETERIZATION     Moderate 3 vessel CAD by cath 05/2012   CHOLECYSTECTOMY  02/2010   JOINT REPLACEMENT  01/2010   L TKR - allusio   LEFT HEART CATHETERIZATION WITH CORONARY ANGIOGRAM N/A 05/22/2012   Procedure: LEFT HEART CATHETERIZATION WITH CORONARY ANGIOGRAM;  Surgeon: Lesleigh Noe, MD;  Location[facility] CATH LAB;  Service: Cardiovascular;  Laterality: N/A;   LEFT HEART CATHETERIZATION WITH CORONARY ANGIOGRAM N/A 02/20/2014   Procedure: LEFT HEART CATHETERIZATION WITH CORONARY ANGIOGRAM;  Surgeon: Peter M Swaziland, MD;  Location: [facility] CATH LAB;  Service: Cardiovascular;  Laterality: N/A;   TONSILLECTOMY     TUBAL LIGATION     Current Outpatient Medications on File Prior to Visit  Medication Sig Dispense Refill   acetaminophen (TYLENOL) 500 MG tablet  Take 500 mg by mouth every 6 (six) hours as needed for moderate pain.     carvedilol (COREG CR) 40 MG 24 hr capsule Take 1 capsule every evening. 90 capsule 3   clopidogrel (PLAVIX) 75 MG tablet TAKE 1 TABLET DAILY 90 tablet 3   conjugated estrogens (PREMARIN) vaginal cream Place 1 Applicatorful vaginally 2 (two) times a week. Tuesday and Saturday.     EPINEPHrine 0.3 mg/0.3 mL IJ SOAJ injection SMARTSIG:0.3 Milliliter(s) IM Once PRN 1 each 5   famotidine (PEPCID) 20 MG tablet Take 1 tablet (20 mg total) by mouth 2 (two) times daily. 180 tablet 1   fexofenadine (ALLEGRA) 180 MG tablet Take 1 tablet (180 mg total) by mouth 2 (two)  times daily. 180 tablet 5   hydrochlorothiazide (MICROZIDE) 12.5 MG capsule Take 1 capsule (12.5 mg total) by mouth daily. 90 capsule 1   isosorbide mononitrate (IMDUR) 60 MG 24 hr tablet TAKE 1 TABLET DAILY 90 tablet 3   Multiple Vitamins-Minerals (CENTRUM SILVER 50+WOMEN PO) Take 1 tablet by mouth daily.     nitroGLYCERIN (NITROSTAT) 0.4 MG SL tablet DISSOLVE 1 TABLET UNDER THE TONGUE EVERY 5 MINUTES AS NEEDED FOR CHEST PAIN 75 tablet 3   OVER THE COUNTER MEDICATION Take 1 capsule by mouth daily. Cranberry D - Mann[place]     rosuvastatin (CRESTOR) 20 MG tablet Take 1 tablet (20 mg total) by mouth at bedtime. 90 tablet 0   SYNTHROID 50 MCG tablet TAKE 1 TABLET ALTERNATING WITH ONE AND ONE-HALF TABLETS AS DIRECTED 108 tablet 3   trimethoprim (TRIMPEX) 100 MG tablet Take 100 mg by mouth daily.     trimethoprim-polymyxin b (POLYTRIM) ophthalmic solution Place 2 drops into the left eye every 6 (six) hours. 10 mL 0   No current facility-administered medications on file prior to visit.   Allergies  Allergen Reactions   Ciprofloxacin Hives   Other Other (See Comments)    Frozen Blood Plasma-caused patient to crash / cardiac arrest / CAN NEVER TAKE THIS AGAIN-PER MD'S.   ALL NARCOTICS- NAUSEA AND VOMITING    Adhesive [Tape] Itching, Swelling, Rash and Other (See Comments)    Also pain at location   Codeine Nausea And Vomiting   Bactrim [Sulfamethoxazole-Trimethoprim]     Muscle aches   Cephalexin Hives   Elemental Sulfur Hives and Nausea Only   Lisinopril Swelling   Macrobid [Nitrofurantoin Monohyd Macro] Nausea And Vomiting   Tamiflu [Oseltamivir Phosphate]    Social History   Socioeconomic History   Marital status: Married    Spouse name: Not on file   Number of children: Not on file   Years of education: Not on file   Highest education level: Not on file  Occupational History   Not on file  Tobacco Use   Smoking status: Never   Smokeless tobacco: Never  Vaping Use   Vaping  status: Never Used  Substance and Sexual Activity   Alcohol use: Not Currently   Drug use: Never   Sexual activity: Not Currently  Other Topics Concern   Not on file  Social History Narrative   ** Merged History Encounter **       Married, 2 children. Retired from [employer]   Social Determinants of Health   Financial Resource Strain: Low Risk  (07/14/2023)   Overall Financial Resource Strain (CARDIA)    Difficulty of Paying Living Expenses: Not hard at all  Food Insecurity: No Food Insecurity (07/14/2023)   Hunger Vital Sign  Worried About Programme researcher, broadcasting/film/video in the Last Year: Never true    Ran Out of Food in the Last Year: Never true  Transportation Needs: No Transportation Needs (07/14/2023)   PRAPARE - Administrator, Civil Service (Medical): No    Lack of Transportation (Non-Medical): No  Physical Activity: Insufficiently Active (07/14/2023)   Exercise Vital Sign    Days of Exercise per Week: 3 days    Minutes of Exercise per Session: 30 min  Stress: No Stress Concern Present (07/14/2023)   [employer] of Occupational Health - Occupational Stress Questionnaire    Feeling of Stress : Not at all  Social Connections: Socially Integrated (07/14/2023)   Social Connection and Isolation Panel [NHANES]    Frequency of Communication with Friends and Family: More than three times a week    Frequency of Social Gatherings with Friends and Family: Three times a week    Attends Religious Services: More than 4 times per year    Active Member of Clubs or Organizations: Yes    Attends Banker Meetings: 1 to 4 times per year    Marital Status: Married  Catering manager Violence: Not At Risk (07/14/2023)   Humiliation, Afraid, Rape, and Kick questionnaire    Fear of Current or Ex-Partner: No    Emotionally Abused: No    Physically Abused: No    Sexually Abused: No   Family History  Problem Relation Age of Onset   Allergies Father    Cancer Father     Coronary artery disease Mother 12   Heart disease Mother    CAD Sister 51   Cancer Sister    AAA (abdominal aortic aneurysm) Sister    Hypertension Sister    Hyperlipidemia Sister    Diabetes Sister    Heart disease Sister    Diabetes Maternal Aunt    Allergic rhinitis Neg Hx    Asthma Neg Hx    Eczema Neg Hx    Urticaria Neg Hx       Review of Systems  All other systems reviewed and are negative.      Objective:   Physical Exam Vitals reviewed.  Constitutional:      General: She is not in acute distress.    Appearance: She is well-developed. She is not diaphoretic.  Neck:     Thyroid: Thyromegaly present.     Vascular: No JVD.     Trachea: No tracheal deviation.  Cardiovascular:     Rate and Rhythm: Normal rate and regular rhythm.     Heart sounds: Normal heart sounds. No murmur heard. Pulmonary:     Effort: Pulmonary effort is normal. No respiratory distress.     Breath sounds: Normal breath sounds. No wheezing or rales.  Abdominal:     General: Bowel sounds are normal. There is no distension.     Palpations: Abdomen is soft.     Tenderness: There is no abdominal tenderness. There is no guarding or rebound.  Musculoskeletal:     Cervical back: Neck supple.  Lymphadenopathy:     Cervical: No cervical adenopathy.  Neurological:     Mental Status: She is alert and oriented to person, place, and time.     Cranial Nerves: No cranial nerve deficit.     Motor: No abnormal muscle tone.     Coordination: Coordination normal.     Deep Tendon Reflexes: Reflexes are normal and symmetric.     Patient has a large goiter  Assessment & Plan:  B12 deficiency  Coronary artery disease involving native coronary artery of native heart without angina pectoris  Pure hypercholesterolemia  General medical exam  Multinodular goiter I reviewed the patient's labs with her in detail.  Her labs appear outstanding.  There are no concerning findings.  Her physical exam today  is normal outside of the large goiter which is stable.  She had a thyroid ultrasound in June which showed no change in the dominant right lobe nodule.  Therefore thought to be benign.  Patient declines any surgery to remove.  Patient has declined any further cancer screening including mammograms.  She also declines a repeat bone density test.  If she changes her mind I will be happy to order a bone density test.  I did recommend calcium 1200 mg a day and vitamin D 1000 units a day.  Strongly recommended flu shot and a COVID-vaccine this fall.  Also recommended a booster of the shingles vaccine.  We discussed the RSV vaccine. Lab on 07/15/2023  Component Date Value Ref Range Status   Glucose, Bld 07/15/2023 106 (H)  65 - 99 mg/dL Final   Comment: .            Fasting reference interval . For someone without known diabetes, a glucose value between 100 and 125 mg/dL is consistent with prediabetes and should be confirmed with a follow-up test. .    BUN 07/15/2023 12  7 - 25 mg/dL Final   Creat 32/44/0102 0.83  0.60 - 0.95 mg/dL Final   eGFR 72/53/6644 68  > OR = 60 mL/min/1.67m2 Final   BUN/Creatinine Ratio 07/15/2023 SEE NOTE:  6 - 22 (calc) Final   Comment:    Not Reported: BUN and Creatinine are within    reference range. .    Sodium 07/15/2023 136  135 - 146 mmol/L Final   Potassium 07/15/2023 4.4  3.5 - 5.3 mmol/L Final   Chloride 07/15/2023 99  98 - 110 mmol/L Final   CO2 07/15/2023 26  20 - 32 mmol/L Final   Calcium 07/15/2023 9.3  8.6 - 10.4 mg/dL Final   Total Protein 03/47/4259 6.4  6.1 - 8.1 g/dL Final   Albumin 56/38/7564 3.9  3.6 - 5.1 g/dL Final   Globulin 33/29/5188 2.5  1.9 - 3.7 g/dL (calc) Final   AG Ratio 07/15/2023 1.6  1.0 - 2.5 (calc) Final   Total Bilirubin 07/15/2023 0.5  0.2 - 1.2 mg/dL Final   Alkaline phosphatase (APISO) 07/15/2023 63  37 - 153 U/L Final   AST 07/15/2023 18  10 - 35 U/L Final   ALT 07/15/2023 12  6 - 29 U/L Final   Cholesterol 07/15/2023 142   <200 mg/dL Final   HDL 41/66/0630 69  > OR = 50 mg/dL Final   Triglycerides 16/11/930 94  <150 mg/dL Final   LDL Cholesterol (Calc) 07/15/2023 55  mg/dL (calc) Final   Comment: Reference range: <100 . Desirable range <100 mg/dL for primary prevention;   <70 mg/dL for patients with CHD or diabetic patients  with > or = 2 CHD risk factors. Marland Kitchen LDL-C is now calculated using the Martin-Hopkins  calculation, which is a validated novel method providing  better accuracy than the Friedewald equation in the  estimation of LDL-C.  Horald Pollen et al. Lenox Ahr. 3557;322(02): 2061-2068  (http://education.[facility].com/faq/FAQ164)    Total CHOL/HDL Ratio 07/15/2023 2.1  <5.4 (calc) Final   Non-HDL Cholesterol (Calc) 07/15/2023 73  <130 mg/dL (calc) Final  Comment: For patients with diabetes plus 1 major ASCVD risk  factor, treating to a non-HDL-C goal of <100 mg/dL  (LDL-C of <16 mg/dL) is considered a therapeutic  option.    WBC 07/15/2023 5.2  3.8 - 10.8 Thousand/uL Final   RBC 07/15/2023 3.89  3.80 - 5.10 Million/uL Final   Hemoglobin 07/15/2023 13.0  11.7 - 15.5 g/dL Final   HCT 10/96/0454 39.1  35.0 - 45.0 % Final   MCV 07/15/2023 100.5 (H)  80.0 - 100.0 fL Final   MCH 07/15/2023 33.4 (H)  27.0 - 33.0 pg Final   MCHC 07/15/2023 33.2  32.0 - 36.0 g/dL Final   RDW 09/81/1914 11.9  11.0 - 15.0 % Final   Platelets 07/15/2023 216  140 - 400 Thousand/uL Final   MPV 07/15/2023 10.1  7.5 - 12.5 fL Final   Neutro Abs 07/15/2023 3,292  1,500 - 7,800 cells/uL Final   Lymphs Abs 07/15/2023 1,212  850 - 3,900 cells/uL Final   Absolute Monocytes 07/15/2023 463  200 - 950 cells/uL Final   Eosinophils Absolute 07/15/2023 182  15 - 500 cells/uL Final   Basophils Absolute 07/15/2023 52  0 - 200 cells/uL Final   Neutrophils Relative % 07/15/2023 63.3  % Final   Total Lymphocyte 07/15/2023 23.3  % Final   Monocytes Relative 07/15/2023 8.9  % Final   Eosinophils Relative 07/15/2023 3.5  % Final    Basophils Relative 07/15/2023 1.0  % Final   TSH 07/15/2023 2.30  0.40 - 4.50 mIU/L Final

## 2023-07-20 ENCOUNTER — Ambulatory Visit: Payer: Medicare Other | Admitting: Cardiovascular Disease

## 2023-07-21 MED ORDER — CYANOCOBALAMIN 1000 MCG/ML IJ SOLN
1000.0000 ug | INTRAMUSCULAR | Status: DC
Start: 2023-07-21 — End: 2023-11-28
  Administered 2023-07-11 – 2023-09-19 (×2): 1000 ug via INTRAMUSCULAR

## 2023-07-21 MED ORDER — CYANOCOBALAMIN 1000 MCG/ML IJ SOLN
1000.0000 ug | Freq: Once | INTRAMUSCULAR | Status: DC
Start: 2023-07-21 — End: 2023-07-21

## 2023-07-21 NOTE — Addendum Note (Signed)
Addended by: Arta Silence on: 07/21/2023 10:40 AM   Modules accepted: Orders

## 2023-07-26 ENCOUNTER — Other Ambulatory Visit: Payer: Self-pay

## 2023-07-26 ENCOUNTER — Telehealth: Payer: Self-pay | Admitting: Family Medicine

## 2023-07-26 DIAGNOSIS — E7800 Pure hypercholesterolemia, unspecified: Secondary | ICD-10-CM

## 2023-07-26 DIAGNOSIS — I2510 Atherosclerotic heart disease of native coronary artery without angina pectoris: Secondary | ICD-10-CM

## 2023-07-26 DIAGNOSIS — I214 Non-ST elevation (NSTEMI) myocardial infarction: Secondary | ICD-10-CM

## 2023-07-26 MED ORDER — ROSUVASTATIN CALCIUM 20 MG PO TABS
20.0000 mg | ORAL_TABLET | Freq: Every day | ORAL | 1 refills | Status: DC
Start: 2023-07-26 — End: 2024-01-23

## 2023-07-26 NOTE — Telephone Encounter (Signed)
Prescription Request  07/26/2023  LOV: 07/19/2023  What is the name of the medication or equipment?   rosuvastatin (CRESTOR) 20 MG tablet   carvedilol (COREG CR) 40 MG 24 hr capsule  **90 day scripts requested for both**  Have you contacted your pharmacy to request a refill? Yes   Which pharmacy would you like this sent to?  [facility] - [place], [place] - [address] [place] [address] Phone: [phone] Fax: [phone]    Patient notified that their request is being sent to the clinical staff for review and that they should receive a response within 2 business days.   Please advise pharmacist.

## 2023-07-29 ENCOUNTER — Telehealth: Payer: Self-pay | Admitting: Family Medicine

## 2023-07-29 DIAGNOSIS — I2510 Atherosclerotic heart disease of native coronary artery without angina pectoris: Secondary | ICD-10-CM

## 2023-07-29 MED ORDER — CARVEDILOL PHOSPHATE ER 40 MG PO CP24
ORAL_CAPSULE | ORAL | 3 refills | Status: DC
Start: 2023-07-29 — End: 2024-07-19

## 2023-07-29 NOTE — Telephone Encounter (Signed)
Prescription Request  07/29/2023  LOV: 07/19/2023  What is the name of the medication or equipment? carvedilol (COREG CR) 40 MG 24 hr capsul  90 day supply requested  Have you contacted your pharmacy to request a refill? Yes   Which pharmacy would you like this sent to?  [facility] - [place], [place] - [address] [place] [address] Phone: [phone] Fax: [phone]    Patient notified that their request is being sent to the clinical staff for review and that they should receive a response within 2 business days.   Please advise at [facility] [phone]

## 2023-08-15 ENCOUNTER — Ambulatory Visit: Payer: Medicare Other

## 2023-08-15 DIAGNOSIS — Z23 Encounter for immunization: Secondary | ICD-10-CM

## 2023-08-15 DIAGNOSIS — E538 Deficiency of other specified B group vitamins: Secondary | ICD-10-CM

## 2023-08-15 MED ORDER — CYANOCOBALAMIN 1000 MCG/ML IJ SOLN
1000.0000 ug | Freq: Once | INTRAMUSCULAR | Status: AC
Start: 2023-08-15 — End: 2023-08-15
  Administered 2023-08-15: 1000 ug via INTRAMUSCULAR

## 2023-09-19 ENCOUNTER — Ambulatory Visit (INDEPENDENT_AMBULATORY_CARE_PROVIDER_SITE_OTHER): Payer: Medicare Other

## 2023-09-19 DIAGNOSIS — E538 Deficiency of other specified B group vitamins: Secondary | ICD-10-CM | POA: Diagnosis not present

## 2023-09-19 MED ORDER — CYANOCOBALAMIN 1000 MCG/ML IJ SOLN
1000.0000 ug | Freq: Once | INTRAMUSCULAR | Status: DC
Start: 2023-09-19 — End: 2023-11-28

## 2023-09-19 NOTE — Progress Notes (Signed)
Pt came in for b12 injection. Injection on the L-side upper arm per pt request.  Pt tol injection well w/no c/o and left ambulatory w/no c/o

## 2023-10-11 ENCOUNTER — Ambulatory Visit: Payer: Medicare Other | Attending: Cardiovascular Disease | Admitting: Cardiovascular Disease

## 2023-10-11 ENCOUNTER — Encounter: Payer: Self-pay | Admitting: Cardiovascular Disease

## 2023-10-11 VITALS — BP 140/78 | HR 71 | Ht 66.0 in | Wt 186.0 lb

## 2023-10-11 DIAGNOSIS — E782 Mixed hyperlipidemia: Secondary | ICD-10-CM | POA: Diagnosis present

## 2023-10-11 DIAGNOSIS — I2510 Atherosclerotic heart disease of native coronary artery without angina pectoris: Secondary | ICD-10-CM | POA: Diagnosis not present

## 2023-10-11 DIAGNOSIS — I10 Essential (primary) hypertension: Secondary | ICD-10-CM | POA: Diagnosis not present

## 2023-10-11 NOTE — Progress Notes (Signed)
10/11/2023 AH TROST   09/11/1932  [id number]  Primary Physician Pickard, Priscille Heidelberg, MD Primary Cardiologist: Runell Gess MD Nicholes Calamity, [place]  HPI:  Lindsey Erickson is a 89 y.o.  mildly overweight married Caucasian female mother of 2 children , grandmother of 4 grandchildren who was self-referred to be established in my practice. She was previously a cardiology patient of Dr. Garnette Scheuermann. Her new primary care physician is Dr. Gilmore Laroche at [facility] family [facility]. I last saw her in the office 06/23/2022.Marland Kitchen  Her cardiovascular risk factor profile is notable for treated hypertension and hyperlipidemia. She does have a family history his sister who's had stents. Her cardiac history dates back to 2004 when she had a non-STEMI related to thrombus in the circumflex with moderate RCA disease. She had a negative nuclear stress test in 2008 and underwent cardiac catheterization one year ago on 02/20/14 by Dr. Eldridge Dace after being admitted with chest pain and evolving positive enzymes. She gets infrequent nitroglycerin responsive chest pain. Her anatomy at the time of her last cath was notable for a 70% mid RCA lesion, 50-60% mid-circumflex lesion.  The RCA underwent FFR interrogation which measured 0.9 suggesting that it was not physiologically significant. Since I saw her a year ago she's remained clinically stable without chest pain or shortness of breath. She was seen in the emergency room angioedema related to lisinopril which was discontinued. Blood pressure at home have remained under good control.   Since I saw her in the office 1 year ago she did well .  She walks around the house without limitation.  She denies chest pain or shortness of breath.   Current Meds  Medication Sig   acetaminophen (TYLENOL) 500 MG tablet Take 500 mg by mouth every 6 (six) hours as needed for moderate pain.   carvedilol (COREG CR) 40 MG 24 hr capsule Take 1 capsule every evening.   clopidogrel  (PLAVIX) 75 MG tablet TAKE 1 TABLET DAILY   conjugated estrogens (PREMARIN) vaginal cream Place 1 Applicatorful vaginally 2 (two) times a week. Tuesday and Saturday.   EPINEPHrine 0.3 mg/0.3 mL IJ SOAJ injection SMARTSIG:0.3 Milliliter(s) IM Once PRN   famotidine (PEPCID) 20 MG tablet Take 1 tablet (20 mg total) by mouth 2 (two) times daily.   fexofenadine (ALLEGRA) 180 MG tablet Take 1 tablet (180 mg total) by mouth 2 (two) times daily.   hydrochlorothiazide (MICROZIDE) 12.5 MG capsule Take 1 capsule (12.5 mg total) by mouth daily.   isosorbide mononitrate (IMDUR) 60 MG 24 hr tablet TAKE 1 TABLET DAILY   Multiple Vitamins-Minerals (CENTRUM SILVER 50+WOMEN PO) Take 1 tablet by mouth daily.   nitroGLYCERIN (NITROSTAT) 0.4 MG SL tablet DISSOLVE 1 TABLET UNDER THE TONGUE EVERY 5 MINUTES AS NEEDED FOR CHEST PAIN   OVER THE COUNTER MEDICATION Take 1 capsule by mouth daily. Cranberry D - [place]   rosuvastatin (CRESTOR) 20 MG tablet Take 1 tablet (20 mg total) by mouth at bedtime.   SYNTHROID 50 MCG tablet TAKE 1 TABLET ALTERNATING WITH ONE AND ONE-HALF TABLETS AS DIRECTED   trimethoprim (TRIMPEX) 100 MG tablet Take 100 mg by mouth daily.   trimethoprim-polymyxin b (POLYTRIM) ophthalmic solution Place 2 drops into the left eye every 6 (six) hours.   Current Facility-Administered Medications for the 10/11/23 encounter (Office Visit) with Runell Gess, MD  Medication   cyanocobalamin (VITAMIN B12) injection 1,000 mcg   cyanocobalamin (VITAMIN B12) injection 1,000 mcg  Allergies  Allergen Reactions   Ciprofloxacin Hives   Other Other (See Comments)    Frozen Blood Plasma-caused patient to crash / cardiac arrest / CAN NEVER TAKE THIS AGAIN-PER MD'S.   ALL NARCOTICS- NAUSEA AND VOMITING    Adhesive [Tape] Itching, Swelling, Rash and Other (See Comments)    Also pain at location   Codeine Nausea And Vomiting   Bactrim [Sulfamethoxazole-Trimethoprim]     Muscle aches   Cephalexin Hives    Elemental Sulfur Hives and Nausea Only   Lisinopril Swelling   Macrobid [Nitrofurantoin Monohyd Macro] Nausea And Vomiting   Tamiflu [Oseltamivir Phosphate]     Social History   Socioeconomic History   Marital status: Married    Spouse name: Not on file   Number of children: Not on file   Years of education: Not on file   Highest education level: Not on file  Occupational History   Not on file  Tobacco Use   Smoking status: Never   Smokeless tobacco: Never  Vaping Use   Vaping status: Never Used  Substance and Sexual Activity   Alcohol use: Not Currently   Drug use: Never   Sexual activity: Not Currently  Other Topics Concern   Not on file  Social History Narrative   ** Merged History Encounter **       Married, 2 children. Retired from [employer]   Social Determinants of Health   Financial Resource Strain: Low Risk  (07/14/2023)   Overall Financial Resource Strain (CARDIA)    Difficulty of Paying Living Expenses: Not hard at all  Food Insecurity: No Food Insecurity (07/14/2023)   Hunger Vital Sign    Worried About Running Out of Food in the Last Year: Never true    Ran Out of Food in the Last Year: Never true  Transportation Needs: No Transportation Needs (07/14/2023)   PRAPARE - Administrator, Civil Service (Medical): No    Lack of Transportation (Non-Medical): No  Physical Activity: Insufficiently Active (07/14/2023)   Exercise Vital Sign    Days of Exercise per Week: 3 days    Minutes of Exercise per Session: 30 min  Stress: No Stress Concern Present (07/14/2023)   [employer] of Occupational Health - Occupational Stress Questionnaire    Feeling of Stress : Not at all  Social Connections: Socially Integrated (07/14/2023)   Social Connection and Isolation Panel [NHANES]    Frequency of Communication with Friends and Family: More than three times a week    Frequency of Social Gatherings with Friends and Family: Three times a week    Attends  Religious Services: More than 4 times per year    Active Member of Clubs or Organizations: Yes    Attends Banker Meetings: 1 to 4 times per year    Marital Status: Married  Catering manager Violence: Not At Risk (07/14/2023)   Humiliation, Afraid, Rape, and Kick questionnaire    Fear of Current or Ex-Partner: No    Emotionally Abused: No    Physically Abused: No    Sexually Abused: No     Review of Systems: General: negative for chills, fever, night sweats or weight changes.  Cardiovascular: negative for chest pain, dyspnea on exertion, edema, orthopnea, palpitations, paroxysmal nocturnal dyspnea or shortness of breath Dermatological: negative for rash Respiratory: negative for cough or wheezing Urologic: negative for hematuria Abdominal: negative for nausea, vomiting, diarrhea, bright red blood per rectum, melena, or hematemesis Neurologic: negative for visual changes,  syncope, or dizziness All other systems reviewed and are otherwise negative except as noted above.    Blood pressure (!) 155/81, pulse 71, height 5\' 6"  (1.676 m), weight 186 lb (84.4 kg), SpO2 96%.  General appearance: alert and no distress Neck: no adenopathy, no carotid bruit, no JVD, supple, symmetrical, trachea midline, and thyroid not enlarged, symmetric, no tenderness/mass/nodules Lungs: clear to auscultation bilaterally Heart: regular rate and rhythm, S1, S2 normal, no murmur, click, rub or gallop Extremities: extremities normal, atraumatic, no cyanosis or edema Pulses: 2+ and symmetric Skin: Skin color, texture, turgor normal. No rashes or lesions Neurologic: Grossly normal  EKG EKG Interpretation Date/Time:  Tuesday October 11 2023 15:40:37 EST Ventricular Rate:  71 PR Interval:  168 QRS Duration:  74 QT Interval:  396 QTC Calculation: 430 R Axis:   -8  Text Interpretation: Normal sinus rhythm Septal infarct , age undetermined When compared with ECG of 14-Dec-2018 07:41, PREVIOUS ECG  IS PRESENT Confirmed by Nanetta Batty [phone]) on 10/11/2023 3:49:39 PM    ASSESSMENT AND PLAN:   Essential hypertension History of essential hypertension her blood pressure measured today at 155/81.  She is on carvedilol, and hydrochlorothiazide.  CAD (coronary artery disease) History of CAD status post non-STEMI secondary to thrombus in the circumflex with moderate RCA disease in 2004.  She had a cath performed with Dr. Rolla Plate revealing 70% mid RCA 60% mid circumflex stenosis.  Her RCA underwent FFR analysis which was 0.9 suggesting it was not physiologically significant.  She is completely asymptomatic.  Hyperlipidemia History of hyperlipidemia on statin therapy with lipid profile performed 07/15/2023 revealing total cholesterol of 142, LDL 55 and HDL of 69.     Runell Gess MD FACP,FACC,FAHA, [facility] 10/11/2023 3:56 PM

## 2023-10-11 NOTE — Assessment & Plan Note (Signed)
History of essential hypertension her blood pressure measured today at 155/81.  She is on carvedilol, and hydrochlorothiazide.

## 2023-10-11 NOTE — Assessment & Plan Note (Signed)
History of hyperlipidemia on statin therapy with lipid profile performed 07/15/2023 revealing total cholesterol of 142, LDL 55 and HDL of 69.

## 2023-10-11 NOTE — Patient Instructions (Signed)

## 2023-10-11 NOTE — Assessment & Plan Note (Signed)
History of CAD status post non-STEMI secondary to thrombus in the circumflex with moderate RCA disease in 2004.  She had a cath performed with Dr. Rolla Plate revealing 70% mid RCA 60% mid circumflex stenosis.  Her RCA underwent FFR analysis which was 0.9 suggesting it was not physiologically significant.  She is completely asymptomatic.

## 2023-10-24 ENCOUNTER — Ambulatory Visit (INDEPENDENT_AMBULATORY_CARE_PROVIDER_SITE_OTHER): Payer: Medicare Other

## 2023-10-24 DIAGNOSIS — E538 Deficiency of other specified B group vitamins: Secondary | ICD-10-CM | POA: Diagnosis not present

## 2023-10-24 MED ORDER — CYANOCOBALAMIN 1000 MCG/ML IJ SOLN
1000.0000 ug | Freq: Once | INTRAMUSCULAR | Status: AC
Start: 2023-10-24 — End: 2023-10-24
  Administered 2023-10-24: 1000 ug via INTRAMUSCULAR

## 2023-10-24 NOTE — Progress Notes (Signed)
Pt given b12 injection the L-deltoid per pt request. Pt tol injection well w/no c/o. Pt left ambulatory

## 2023-11-28 ENCOUNTER — Ambulatory Visit (INDEPENDENT_AMBULATORY_CARE_PROVIDER_SITE_OTHER): Payer: Medicare Other

## 2023-11-28 DIAGNOSIS — E538 Deficiency of other specified B group vitamins: Secondary | ICD-10-CM | POA: Diagnosis not present

## 2023-11-28 MED ORDER — CYANOCOBALAMIN 1000 MCG/ML IJ SOLN
1000.0000 ug | Freq: Once | INTRAMUSCULAR | Status: AC
Start: 2023-11-28 — End: 2023-11-28
  Administered 2023-11-28: 1000 ug via INTRAMUSCULAR

## 2023-12-28 NOTE — Telephone Encounter (Signed)
 Erroneous encounter

## 2024-01-02 ENCOUNTER — Ambulatory Visit (INDEPENDENT_AMBULATORY_CARE_PROVIDER_SITE_OTHER): Payer: Medicare Other

## 2024-01-02 DIAGNOSIS — E538 Deficiency of other specified B group vitamins: Secondary | ICD-10-CM | POA: Diagnosis not present

## 2024-01-02 MED ORDER — CYANOCOBALAMIN 1000 MCG/ML IJ SOLN
1000.0000 ug | Freq: Once | INTRAMUSCULAR | Status: AC
Start: 2024-01-02 — End: 2024-01-02
  Administered 2024-01-02: 1000 ug via INTRAMUSCULAR

## 2024-01-04 ENCOUNTER — Other Ambulatory Visit: Payer: Self-pay | Admitting: Family Medicine

## 2024-01-23 ENCOUNTER — Other Ambulatory Visit: Payer: Self-pay | Admitting: Family Medicine

## 2024-01-23 DIAGNOSIS — E7800 Pure hypercholesterolemia, unspecified: Secondary | ICD-10-CM

## 2024-01-23 DIAGNOSIS — I214 Non-ST elevation (NSTEMI) myocardial infarction: Secondary | ICD-10-CM

## 2024-01-23 DIAGNOSIS — I2510 Atherosclerotic heart disease of native coronary artery without angina pectoris: Secondary | ICD-10-CM

## 2024-02-06 ENCOUNTER — Ambulatory Visit (INDEPENDENT_AMBULATORY_CARE_PROVIDER_SITE_OTHER): Payer: Medicare Other

## 2024-02-06 DIAGNOSIS — E538 Deficiency of other specified B group vitamins: Secondary | ICD-10-CM

## 2024-02-06 MED ORDER — CYANOCOBALAMIN 1000 MCG/ML IJ SOLN
1000.0000 ug | Freq: Once | INTRAMUSCULAR | Status: AC
  Administered 2024-02-06: 1000 ug via INTRAMUSCULAR

## 2024-02-06 NOTE — Progress Notes (Signed)
 Patient is in office today for a nurse visit for B12 Injection. Patient Injection was given in the  Left deltoid. Patient tolerated injection well.  Vella Kohler, CMA

## 2024-02-29 ENCOUNTER — Other Ambulatory Visit: Payer: Self-pay | Admitting: Family Medicine

## 2024-02-29 NOTE — Telephone Encounter (Signed)
 OV needed for additional refill.  Requested Prescriptions  Pending Prescriptions Disp Refills   clopidogrel (PLAVIX) 75 MG tablet [Pharmacy Med Name: CLOPIDOGREL BISULFATE TABS 75MG ] 30 tablet 0    Sig: TAKE 1 TABLET DAILY     Hematology: Antiplatelets - clopidogrel Failed - 02/29/2024  4:01 PM      Failed - HCT in normal range and within 180 days    HCT  Date Value Ref Range Status  07/15/2023 39.1 35.0 - 45.0 % Final         Failed - HGB in normal range and within 180 days    Hemoglobin  Date Value Ref Range Status  07/15/2023 13.0 11.7 - 15.5 g/dL Final         Failed - PLT in normal range and within 180 days    Platelets  Date Value Ref Range Status  07/15/2023 216 140 - 400 Thousand/uL Final         Failed - Valid encounter within last 6 months    Recent Outpatient Visits           7 months ago B12 deficiency   [facility] Donita Brooks, MD   1 year ago B12 deficiency   [place] [facility] Pickard, Priscille Heidelberg, MD       Future Appointments             In 4 months Pickard, Priscille Heidelberg, MD  [facility]            Passed - Cr in normal range and within 360 days    Creat  Date Value Ref Range Status  07/15/2023 0.83 0.60 - 0.95 mg/dL Final

## 2024-03-05 ENCOUNTER — Other Ambulatory Visit: Payer: Self-pay | Admitting: Cardiovascular Disease

## 2024-03-12 ENCOUNTER — Ambulatory Visit (INDEPENDENT_AMBULATORY_CARE_PROVIDER_SITE_OTHER): Payer: Medicare Other | Admitting: Endocrinology

## 2024-03-12 DIAGNOSIS — E039 Hypothyroidism, unspecified: Secondary | ICD-10-CM

## 2024-03-12 DIAGNOSIS — R7303 Prediabetes: Secondary | ICD-10-CM | POA: Diagnosis not present

## 2024-03-12 DIAGNOSIS — E042 Nontoxic multinodular goiter: Secondary | ICD-10-CM

## 2024-03-12 LAB — TSH: TSH: 1.01 m[IU]/L (ref 0.40–4.50)

## 2024-03-12 LAB — T4, FREE: Free T4: 1.4 ng/dL (ref 0.8–1.8)

## 2024-03-12 NOTE — Progress Notes (Unsigned)
 Outpatient Endocrinology Note [place] Talen Poser, MD   Patient's Name: Lindsey Erickson    DOB: November 23, 1931    MRN: [id number]  REASON OF VISIT: New consult / *** Follow-up for thyroid  nodules ***  PCP: Austine Lefort, MD  REFERRING PROVIDER:    HISTORY OF PRESENT ILLNESS:   Lindsey Erickson is a 89 y.o. old female with past medical history as listed below is presented for a follow up / *** new consult for thyroid  nodules.  Pertinent Thyroid  History:  - Patient incidentally found to have thyroid  nodules on *** done for ***. Subsequently had ultrasound in *** which showed ***  - Patient denies compressive symptoms including dysphagia, shortness of breath, neck pain or voice changes. - Patient denies hypo or hyperthyroid symptoms including heat or cold intolerance, sweating, palpitation, significant weight loss or gain, fatigue, change in bowel movements, hair loss, skin changes. - Patient denies family h/o thyroid  cancer. - Patient denies h/o significant radiation exposure or radiation treatment in the neck. - Thyroid  US  on : reviewed images showed  - Labs:   No specialty comments available.  No complaints today.  Interval history  She reports no new neck pain, change in voice, dysphagia, or shortness of breath. No symptoms suggesting hypothyroidism or hyperthyroidism. No local neck pain, or tenderness. No masses in neck noted by patient.  Thyroid  medications: @THYROIDDRUGS @   REVIEW OF SYSTEMS:  As per history of present illness.   PAST MEDICAL HISTORY: Past Medical History:  Diagnosis Date   Anemia    Angio-edema    Arthritis    Blood transfusion without reported diagnosis 1957   CAD (coronary artery disease)    a. NSTEMI 2004 with thrombus in Cx and mod RCA. b. Nonischemic nuc 2008. c. mild NSTEMI 01/2013 (trop 0.42) -> cath with unchanged mod LAD & RCA disease, for continued medical therapy.   Cancer (HCC)    Cataract    Dementia (HCC)    has improved-suspect b12  deficincy and concussion were to blame   H/O Hashimoto thyroiditis    Hyperlipidemia    HYPERTENSION    a. Also has h/o intermittentl low BP.   Hypothyroidism    Kidney stones    Myocardial infarction [facility])    Osteoporosis    no fractures   Prediabetes    QT prolongation    a. Prior EKGs QTc 474, but on 4/1 was 524 for unclear reason.   Recurrent urinary tract infection    Squamous cell carcinoma of skin 04/17/2015   Urticaria     PAST SURGICAL HISTORY: Past Surgical History:  Procedure Laterality Date   ADENOIDECTOMY     APPENDECTOMY  1999   Bilateral Arthroscopies of knees     CARDIAC CATHETERIZATION     Moderate 3 vessel CAD by cath 05/2012   CHOLECYSTECTOMY  02/2010   JOINT REPLACEMENT  01/2010   L TKR - allusio   LEFT HEART CATHETERIZATION WITH CORONARY ANGIOGRAM N/A 05/22/2012   Procedure: LEFT HEART CATHETERIZATION WITH CORONARY ANGIOGRAM;  Surgeon: Mickiel Albany, MD;  Location: [facility] CATH LAB;  Service: Cardiovascular;  Laterality: N/A;   LEFT HEART CATHETERIZATION WITH CORONARY ANGIOGRAM N/A 02/20/2014   Procedure: LEFT HEART CATHETERIZATION WITH CORONARY ANGIOGRAM;  Surgeon: Peter M Swaziland, MD;  Location: [facility] CATH LAB;  Service: Cardiovascular;  Laterality: N/A;   TONSILLECTOMY     TUBAL LIGATION      ALLERGIES: Allergies  Allergen Reactions   Ciprofloxacin  Hives   Other Other (See  Comments)    Frozen Blood Plasma-caused patient to crash / cardiac arrest / CAN NEVER TAKE THIS AGAIN-PER MD'S.   ALL NARCOTICS- NAUSEA AND VOMITING    Adhesive [Tape] Itching, Swelling, Rash and Other (See Comments)    Also pain at location   Codeine Nausea And Vomiting   Bactrim  [Sulfamethoxazole -Trimethoprim ]     Muscle aches   Cephalexin  Hives   Elemental Sulfur Hives and Nausea Only   Lisinopril  Swelling   Macrobid [Nitrofurantoin Monohyd Macro] Nausea And Vomiting   Tamiflu  [Oseltamivir  Phosphate]     FAMILY HISTORY:  Family History  Problem Relation Age of Onset    Allergies Father    Cancer Father    Coronary artery disease Mother 78   Heart disease Mother    CAD Sister 97   Cancer Sister    AAA (abdominal aortic aneurysm) Sister    Hypertension Sister    Hyperlipidemia Sister    Diabetes Sister    Heart disease Sister    Diabetes Maternal Aunt    Allergic rhinitis Neg Hx    Asthma Neg Hx    Eczema Neg Hx    Urticaria Neg Hx     SOCIAL HISTORY: Social History   Socioeconomic History   Marital status: Married    Spouse name: Not on file   Number of children: Not on file   Years of education: Not on file   Highest education level: Not on file  Occupational History   Not on file  Tobacco Use   Smoking status: Never   Smokeless tobacco: Never  Vaping Use   Vaping status: Never Used  Substance and Sexual Activity   Alcohol use: Not Currently   Drug use: Never   Sexual activity: Not Currently  Other Topics Concern   Not on file  Social History Narrative   ** Merged History Encounter **       Married, 2 children. Retired from [employer]   Social Drivers of Health   Financial Resource Strain: Low Risk  (07/14/2023)   Overall Financial Resource Strain (CARDIA)    Difficulty of Paying Living Expenses: Not hard at all  Food Insecurity: No Food Insecurity (07/14/2023)   Hunger Vital Sign    Worried About Running Out of Food in the Last Year: Never true    Ran Out of Food in the Last Year: Never true  Transportation Needs: No Transportation Needs (07/14/2023)   PRAPARE - Administrator, Civil Service (Medical): No    Lack of Transportation (Non-Medical): No  Physical Activity: Insufficiently Active (07/14/2023)   Exercise Vital Sign    Days of Exercise per Week: 3 days    Minutes of Exercise per Session: 30 min  Stress: No Stress Concern Present (07/14/2023)   [employer] of Occupational Health - Occupational Stress Questionnaire    Feeling of Stress : Not at all  Social Connections: Socially Integrated  (07/14/2023)   Social Connection and Isolation Panel [NHANES]    Frequency of Communication with Friends and Family: More than three times a week    Frequency of Social Gatherings with Friends and Family: Three times a week    Attends Religious Services: More than 4 times per year    Active Member of Clubs or Organizations: Yes    Attends Banker Meetings: 1 to 4 times per year    Marital Status: Married    MEDICATIONS:  Current Outpatient Medications  Medication Sig Dispense Refill   acetaminophen  (  TYLENOL ) 500 MG tablet Take 500 mg by mouth every 6 (six) hours as needed for moderate pain.     carvedilol  (COREG  CR) 40 MG 24 hr capsule Take 1 capsule every evening. 90 capsule 3   clopidogrel  (PLAVIX ) 75 MG tablet TAKE 1 TABLET DAILY 30 tablet 0   conjugated estrogens (PREMARIN) vaginal cream Place 1 Applicatorful vaginally 2 (two) times a week. Tuesday and Saturday.     EPINEPHrine  0.3 mg/0.3 mL IJ SOAJ injection SMARTSIG:0.3 Milliliter(s) IM Once PRN 1 each 5   famotidine  (PEPCID ) 20 MG tablet Take 1 tablet (20 mg total) by mouth 2 (two) times daily. 180 tablet 1   fexofenadine  (ALLEGRA ) 180 MG tablet Take 1 tablet (180 mg total) by mouth 2 (two) times daily. 180 tablet 5   hydrochlorothiazide  (MICROZIDE ) 12.5 MG capsule Take 1 capsule (12.5 mg total) by mouth daily. 90 capsule 2   isosorbide  mononitrate (IMDUR ) 60 MG 24 hr tablet TAKE 1 TABLET DAILY 90 tablet 3   Multiple Vitamins-Minerals (CENTRUM SILVER 50+WOMEN PO) Take 1 tablet by mouth daily.     nitroGLYCERIN  (NITROSTAT ) 0.4 MG SL tablet DISSOLVE 1 TABLET UNDER THE TONGUE EVERY 5 MINUTES AS NEEDED FOR CHEST PAIN 75 tablet 3   OVER THE COUNTER MEDICATION Take 1 capsule by mouth daily. Cranberry D - [place]     rosuvastatin  (CRESTOR ) 20 MG tablet TAKE 1 TABLET AT BEDTIME 90 tablet 3   SYNTHROID  50 MCG tablet TAKE 1 TABLET ALTERNATING WITH ONE AND ONE-HALF TABLETS AS DIRECTED 108 tablet 3   trimethoprim  (TRIMPEX ) 100 MG  tablet Take 100 mg by mouth daily.     trimethoprim -polymyxin b  (POLYTRIM ) ophthalmic solution Place 2 drops into the left eye every 6 (six) hours. 10 mL 0   No current facility-administered medications for this visit.    PHYSICAL EXAM: There were no vitals filed for this visit. There is no height or weight on file to calculate BMI.  Wt Readings from Last 3 Encounters:  10/11/23 186 lb (84.4 kg)  07/19/23 185 lb 6.4 oz (84.1 kg)  07/14/23 183 lb (83 kg)     General: Well developed, well nourished female in no apparent distress.  HEENT: AT/[place], no external lesions. Hearing intact to the spoken word Eyes: EOMI. No stare, proptosis. Conjunctiva clear and no icterus. Neck: Trachea midline, neck supple without appreciable thyromegaly or lymphadenopathy and no palpable thyroid  nodules Lungs: Clear to auscultation, no wheeze. Respirations not labored Heart: S1S2, Regular in rate and rhythm. No loud murmurs Abdomen: Soft, non tender, non distended, no masses, no striae Neurologic: Alert, oriented, normal speech, deep tendon biceps reflexes normal,  no gross focal neurological deficit Extremities: No pedal pitting edema, no tremors of outstretched hands Skin: Warm, color good. No sores or rashes noted Psychiatric: Does not appear depressed or anxious  PERTINENT HISTORIC LABORATORY AND IMAGING STUDIES:  All pertinent laboratory results were reviewed. Please see HPI also for further details.   TSH  Date Value Ref Range Status  07/15/2023 2.30 0.40 - 4.50 mIU/L Final  03/02/2023 0.93 0.35 - 5.50 uIU/mL Final  02/24/2022 1.68 0.35 - 5.50 uIU/mL Final     ASSESSMENT / PLAN  No diagnosis found.  - No compressive symptoms, euthyroid, no family h/o thyroid  cancer, no h/o radiation exposure.   Thyroid  nodule / FNA talk: -Approximately 5% of individuals have palpable thyroid  nodules and 30% or more of adults have non-palpable nodules. The prevalence of nodules increases with age, so that  perhaps 50% of  individuals older than 88 years of age have nodules. Many of these nodules will be well under 1cm in size.  -The incidence of thyroid  cancer is low, but rising. The prevalence of thyroid  cancer is low compared with the prevalence of thyroid  nodule.  -There have been a number of studies that have estimated risk of malignancy in thyroid  nodule. The estimated risk varies with size and other characteristics of nodules selected for biopsy, the technique used for the biopsy, the institution and its referral patterns, and the characteristics of the local population. Most studies have estimated malignancy risk in nodules that are palpable or > 1cm on U/S to be in the range of 3-15%.    - In general, options for further evaluation include: - Follow with serial U/S - Fine needle aspiration biopsy - Thyroidectomy - In general the criteria for FNA biopsy includes:  - All palpable nodules - Generally nodules > 1 and for some nodules up to > 1 to 2 cm, based on other criteria.  - Rarely Nodules > 8-79mm in two or more dimensions with high risk sonographic features, or occuring in patients with high risk historical features. Nodules not meeting the above criteria can generally be followed with serial ultrasounds.  -Thyroid  FNA is required for further evaluation of nodule to see if suspicious for cancer which may require further surgical treatment. -There are false positive and false negative results with FNA and need for repeat FNA or surgical resection in some cases and also possibility of missing a diagnosis of Cancer in 5-15% of cases. -discussed the risk and benefits of procedure and potential complications such as bleeding, pain in neck, swelling in neck in rare cases due to hematoma formation and causing respiratory compromise, possibility of infection etc. -discussed the need for continued follow up even if the nodule were found to be benign on FNA.  -discussed that the only near 100% method  of ruling out thyroid  cancer is by surgical resection. -patient agreeable for Thyroid  FNA.  -will schedule for thyroid  FNA ***  You may read about thyroid  nodule from this link of American Thyroid  Association.  https://www.thyroid .org/thyroid -nodules/  We have reviewed our plan outlined above with the patient verbalizes understanding.    All questions were answered.   There are no diagnoses linked to this encounter.  DISPOSITION Follow up in clinic in *** months suggested.  All questions answered and patient verbalized understanding of the plan.  [place] Johney Perotti, MD [facility] Endocrinology [facility] [address] [place], [address] [place], [place] [address] Phone # [phone]  At least part of this note was generated using voice recognition software. Inadvertent word errors may have occurred, which were not recognized during the proofreading process.

## 2024-03-13 ENCOUNTER — Ambulatory Visit (INDEPENDENT_AMBULATORY_CARE_PROVIDER_SITE_OTHER)

## 2024-03-13 ENCOUNTER — Telehealth: Payer: Self-pay

## 2024-03-13 ENCOUNTER — Encounter: Payer: Self-pay | Admitting: Endocrinology

## 2024-03-13 DIAGNOSIS — E538 Deficiency of other specified B group vitamins: Secondary | ICD-10-CM

## 2024-03-13 MED ORDER — CYANOCOBALAMIN 1000 MCG/ML IJ SOLN
1000.0000 ug | Freq: Once | INTRAMUSCULAR | Status: AC
Start: 2024-03-13 — End: 2024-03-13
  Administered 2024-03-13: 1000 ug via INTRAMUSCULAR

## 2024-03-13 MED ORDER — SYNTHROID 50 MCG PO TABS: ORAL_TABLET | ORAL | 3 refills | Status: AC

## 2024-03-13 NOTE — Telephone Encounter (Signed)
-----   Message from [place] Thapa sent at 03/13/2024 12:32 PM EDT ----- Please notify patient of normal thyroid  function test, continue current dose of Synthroid /levothyroxine .  No change.  Medication renewed.

## 2024-03-13 NOTE — Progress Notes (Unsigned)
 Patient is in office today for a nurse visit for B12 Injection. Patient Injection was given in the  Left deltoid. Patient tolerated injection well.

## 2024-03-13 NOTE — Telephone Encounter (Signed)
 Patient given results and medication changes as directed by MD.

## 2024-03-15 NOTE — Progress Notes (Signed)
 I have collaborated with the care management provider regarding care management and care coordination activities outlined in this encounter and have reviewed this encounter including documentation in the note and care plan. I am certifying that I agree with the content of this note and encounter as supervising physician.

## 2024-03-28 ENCOUNTER — Other Ambulatory Visit: Payer: Self-pay | Admitting: Family Medicine

## 2024-04-02 ENCOUNTER — Ambulatory Visit (INDEPENDENT_AMBULATORY_CARE_PROVIDER_SITE_OTHER): Admitting: Family Medicine

## 2024-04-02 ENCOUNTER — Encounter: Payer: Self-pay | Admitting: Family Medicine

## 2024-04-02 VITALS — BP 126/72 | HR 77 | Temp 97.5°F | Ht 66.0 in

## 2024-04-02 DIAGNOSIS — M25471 Effusion, right ankle: Secondary | ICD-10-CM | POA: Diagnosis not present

## 2024-04-02 NOTE — Progress Notes (Signed)
 Subjective:    Patient ID: Lindsey Erickson, female    DOB: July 26, 1932, 89 y.o.   MRN: [id number]  Leg Pain      Patient is a very pleasant 89 year old white female who has a significant past medical history of coronary artery disease. According to her report she is suffered 2 separate myocardial infarctions. However on repeat catheterizations, the patient states that she has lesions which are not amenable to stenting. Therefore she is being managed medically. She is currently on a long-acting nitroglycerin , beta blocker, and ACE inhibitor, statin medication, and dual antiplatelet therapy.  She also has history of B12 deficiency.   Patient states that on on Sunday, she rode in the back of a car with her right ankle twisted for more than an hour.  Shortly thereafter, she had tremendous pain in her right ankle with significant swelling in the right ankle.  The swelling was limited to the ankle joint.  It appears to be an effusion in the right ankle joint.  There was no pitting edema in the lower leg.  She has a negative Homans' sign.  There is no erythema or warmth.  She has 2/4 dorsalis pedis and posterior tibialis pulses in the right foot.  Prior to coming to see me she went to an orthopedist who performed an x-ray and saw doctors or injuries.  They placed her on prednisone  and the patient states that she is almost 90% better.  The pain has almost completely resolved in the right ankle although she does still have some significant pitting edema around the ankle joint.  There is no swelling in the left ankle.  She has no pain with passive range of motion Past Medical History:  Diagnosis Date   Anemia    Angio-edema    Arthritis    Blood transfusion without reported diagnosis 1957   CAD (coronary artery disease)    a. NSTEMI 2004 with thrombus in Cx and mod RCA. b. Nonischemic nuc 2008. c. mild NSTEMI 01/2013 (trop 0.42) -> cath with unchanged mod LAD & RCA disease, for continued medical therapy.    Cancer (HCC)    Cataract    Dementia (HCC)    has improved-suspect b12 deficincy and concussion were to blame   H/O Hashimoto thyroiditis    Hyperlipidemia    HYPERTENSION    a. Also has h/o intermittentl low BP.   Hypothyroidism    Kidney stones    Myocardial infarction [facility])    Osteoporosis    no fractures   Prediabetes    QT prolongation    a. Prior EKGs QTc 474, but on 4/1 was 524 for unclear reason.   Recurrent urinary tract infection    Squamous cell carcinoma of skin 04/17/2015   Urticaria    Past Surgical History:  Procedure Laterality Date   ADENOIDECTOMY     APPENDECTOMY  1999   Bilateral Arthroscopies of knees     CARDIAC CATHETERIZATION     Moderate 3 vessel CAD by cath 05/2012   CHOLECYSTECTOMY  02/2010   JOINT REPLACEMENT  01/2010   L TKR - allusio   LEFT HEART CATHETERIZATION WITH CORONARY ANGIOGRAM N/A 05/22/2012   Procedure: LEFT HEART CATHETERIZATION WITH CORONARY ANGIOGRAM;  Surgeon: Mickiel Albany, MD;  Location: [facility] CATH LAB;  Service: Cardiovascular;  Laterality: N/A;   LEFT HEART CATHETERIZATION WITH CORONARY ANGIOGRAM N/A 02/20/2014   Procedure: LEFT HEART CATHETERIZATION WITH CORONARY ANGIOGRAM;  Surgeon: Peter M Swaziland, MD;  Location: [facility] CATH  LAB;  Service: Cardiovascular;  Laterality: N/A;   TONSILLECTOMY     TUBAL LIGATION     Current Outpatient Medications on File Prior to Visit  Medication Sig Dispense Refill   acetaminophen  (TYLENOL ) 500 MG tablet Take 500 mg by mouth every 6 (six) hours as needed for moderate pain.     carvedilol  (COREG  CR) 40 MG 24 hr capsule Take 1 capsule every evening. 90 capsule 3   clopidogrel  (PLAVIX ) 75 MG tablet TAKE 1 TABLET DAILY 30 tablet 11   conjugated estrogens (PREMARIN) vaginal cream Place 1 Applicatorful vaginally 2 (two) times a week. Tuesday and Saturday.     EPINEPHrine  0.3 mg/0.3 mL IJ SOAJ injection SMARTSIG:0.3 Milliliter(s) IM Once PRN 1 each 5   famotidine  (PEPCID ) 20 MG tablet Take 1 tablet (20 mg total)  by mouth 2 (two) times daily. 180 tablet 1   fexofenadine  (ALLEGRA ) 180 MG tablet Take 1 tablet (180 mg total) by mouth 2 (two) times daily. 180 tablet 5   hydrochlorothiazide  (MICROZIDE ) 12.5 MG capsule Take 1 capsule (12.5 mg total) by mouth daily. 90 capsule 2   isosorbide  mononitrate (IMDUR ) 60 MG 24 hr tablet TAKE 1 TABLET DAILY 90 tablet 3   Multiple Vitamins-Minerals (CENTRUM SILVER 50+WOMEN PO) Take 1 tablet by mouth daily.     nitroGLYCERIN  (NITROSTAT ) 0.4 MG SL tablet DISSOLVE 1 TABLET UNDER THE TONGUE EVERY 5 MINUTES AS NEEDED FOR CHEST PAIN 75 tablet 3   OVER THE COUNTER MEDICATION Take 1 capsule by mouth daily. Cranberry D - [place]     rosuvastatin  (CRESTOR ) 20 MG tablet TAKE 1 TABLET AT BEDTIME 90 tablet 3   SYNTHROID  50 MCG tablet TAKE 1 TABLET ALTERNATING WITH ONE AND ONE-HALF TABLETS AS DIRECTED 108 tablet 3   trimethoprim  (TRIMPEX ) 100 MG tablet Take 100 mg by mouth daily.     trimethoprim -polymyxin b  (POLYTRIM ) ophthalmic solution Place 2 drops into the left eye every 6 (six) hours. 10 mL 0   No current facility-administered medications on file prior to visit.   Allergies  Allergen Reactions   Ciprofloxacin  Hives   Other Other (See Comments)    Frozen Blood Plasma-caused patient to crash / cardiac arrest / CAN NEVER TAKE THIS AGAIN-PER MD'S.   ALL NARCOTICS- NAUSEA AND VOMITING    Adhesive [Tape] Itching, Swelling, Rash and Other (See Comments)    Also pain at location   Codeine Nausea And Vomiting   Bactrim  [Sulfamethoxazole -Trimethoprim ]     Muscle aches   Cephalexin  Hives   Elemental Sulfur Hives and Nausea Only   Lisinopril  Swelling   Macrobid [Nitrofurantoin Monohyd Macro] Nausea And Vomiting   Tamiflu  [Oseltamivir  Phosphate]    Social History   Socioeconomic History   Marital status: Married    Spouse name: Not on file   Number of children: Not on file   Years of education: Not on file   Highest education level: Not on file  Occupational History    Not on file  Tobacco Use   Smoking status: Never   Smokeless tobacco: Never  Vaping Use   Vaping status: Never Used  Substance and Sexual Activity   Alcohol use: Not Currently   Drug use: Never   Sexual activity: Not Currently  Other Topics Concern   Not on file  Social History Narrative   ** Merged History Encounter **       Married, 2 children. Retired from [employer]   Social Drivers of Corporate investment banker Strain: Low  Risk  (07/14/2023)   Overall Financial Resource Strain (CARDIA)    Difficulty of Paying Living Expenses: Not hard at all  Food Insecurity: No Food Insecurity (07/14/2023)   Hunger Vital Sign    Worried About Running Out of Food in the Last Year: Never true    Ran Out of Food in the Last Year: Never true  Transportation Needs: No Transportation Needs (07/14/2023)   PRAPARE - Administrator, Civil Service (Medical): No    Lack of Transportation (Non-Medical): No  Physical Activity: Insufficiently Active (07/14/2023)   Exercise Vital Sign    Days of Exercise per Week: 3 days    Minutes of Exercise per Session: 30 min  Stress: No Stress Concern Present (07/14/2023)   [employer] of Occupational Health - Occupational Stress Questionnaire    Feeling of Stress : Not at all  Social Connections: Socially Integrated (07/14/2023)   Social Connection and Isolation Panel [NHANES]    Frequency of Communication with Friends and Family: More than three times a week    Frequency of Social Gatherings with Friends and Family: Three times a week    Attends Religious Services: More than 4 times per year    Active Member of Clubs or Organizations: Yes    Attends Banker Meetings: 1 to 4 times per year    Marital Status: Married  Catering manager Violence: Not At Risk (07/14/2023)   Humiliation, Afraid, Rape, and Kick questionnaire    Fear of Current or Ex-Partner: No    Emotionally Abused: No    Physically Abused: No    Sexually Abused:  No   Family History  Problem Relation Age of Onset   Allergies Father    Cancer Father    Coronary artery disease Mother 33   Heart disease Mother    CAD Sister 76   Cancer Sister    AAA (abdominal aortic aneurysm) Sister    Hypertension Sister    Hyperlipidemia Sister    Diabetes Sister    Heart disease Sister    Diabetes Maternal Aunt    Allergic rhinitis Neg Hx    Asthma Neg Hx    Eczema Neg Hx    Urticaria Neg Hx       Review of Systems  All other systems reviewed and are negative.      Objective:   Physical Exam Vitals reviewed.  Constitutional:      General: She is not in acute distress.    Appearance: She is well-developed. She is not diaphoretic.  Neck:     Thyroid : Thyromegaly present.     Vascular: No JVD.     Trachea: No tracheal deviation.  Cardiovascular:     Rate and Rhythm: Normal rate and regular rhythm.     Heart sounds: Normal heart sounds. No murmur heard. Pulmonary:     Effort: Pulmonary effort is normal. No respiratory distress.     Breath sounds: Normal breath sounds. No wheezing or rales.  Musculoskeletal:     Cervical back: Neck supple.     Right ankle: Swelling present. No tenderness. Normal range of motion. Anterior drawer test negative. Normal pulse.     Right Achilles Tendon: No tenderness. Thompson's test negative.     Left ankle: No swelling.     Right foot: Normal range of motion. No deformity.     Left foot: Normal range of motion. No deformity.       Legs:  Lymphadenopathy:     Cervical:  No cervical adenopathy.  Neurological:     Mental Status: She is alert and oriented to person, place, and time.     Cranial Nerves: No cranial nerve deficit.     Motor: No abnormal muscle tone.     Coordination: Coordination normal.     Deep Tendon Reflexes: Reflexes are normal and symmetric.     Patient has a large goiter     Assessment & Plan:  Right ankle swelling No evidence of congestive heart failure.  The 2 differential  diagnoses would be an effusion due to joint inflammation versus a DVT.  Based on her physical exam and response to prednisone  I feel that this is most likely periarticular joint effusion.  She has responded dramatically to prednisone  and is almost 90% better.  I have recommended weightbearing as tolerated rest and I anticipate the symptoms will gradually improve over the next week.  No evidence of peripheral vascular disease or neuropathy as a potential cause of pain

## 2024-04-18 ENCOUNTER — Ambulatory Visit (INDEPENDENT_AMBULATORY_CARE_PROVIDER_SITE_OTHER)

## 2024-04-18 DIAGNOSIS — E538 Deficiency of other specified B group vitamins: Secondary | ICD-10-CM | POA: Diagnosis not present

## 2024-04-18 MED ORDER — CYANOCOBALAMIN 1000 MCG/ML IJ SOLN
1000.0000 ug | Freq: Once | INTRAMUSCULAR | Status: AC
  Administered 2024-04-18: 1000 ug via INTRAMUSCULAR

## 2024-04-18 NOTE — Progress Notes (Signed)
 Patient is in office today for a nurse visit for B12 Injection. Patient Injection was given in the  Left deltoid. Patient tolerated injection well.  Vallerie Gave, CMA

## 2024-05-21 ENCOUNTER — Ambulatory Visit (INDEPENDENT_AMBULATORY_CARE_PROVIDER_SITE_OTHER)

## 2024-05-21 DIAGNOSIS — E538 Deficiency of other specified B group vitamins: Secondary | ICD-10-CM

## 2024-05-21 MED ORDER — CYANOCOBALAMIN 1000 MCG/ML IJ SOLN
1000.0000 ug | Freq: Once | INTRAMUSCULAR | Status: AC
  Administered 2024-05-21: 1000 ug via INTRAMUSCULAR

## 2024-05-21 NOTE — Progress Notes (Signed)
 Patient is in office today for a nurse visit for B12 Injection. Patient Injection was given in the  Left deltoid. Patient tolerated injection well.  Vallerie Gave, CMA

## 2024-06-25 ENCOUNTER — Ambulatory Visit (INDEPENDENT_AMBULATORY_CARE_PROVIDER_SITE_OTHER)

## 2024-06-25 DIAGNOSIS — E538 Deficiency of other specified B group vitamins: Secondary | ICD-10-CM

## 2024-06-25 MED ORDER — CYANOCOBALAMIN 1000 MCG/ML IJ SOLN
1000.0000 ug | Freq: Once | INTRAMUSCULAR | Status: AC
  Administered 2024-06-25: 1000 ug via INTRAMUSCULAR

## 2024-06-25 NOTE — Progress Notes (Unsigned)
 Patient is in office today for a nurse visit for B12 Injection. Patient Injection was given in the  Left deltoid. Patient tolerated injection well.

## 2024-07-16 ENCOUNTER — Other Ambulatory Visit: Payer: Medicare Other

## 2024-07-16 DIAGNOSIS — E039 Hypothyroidism, unspecified: Secondary | ICD-10-CM

## 2024-07-16 DIAGNOSIS — E7800 Pure hypercholesterolemia, unspecified: Secondary | ICD-10-CM

## 2024-07-16 DIAGNOSIS — Z0000 Encounter for general adult medical examination without abnormal findings: Secondary | ICD-10-CM

## 2024-07-16 DIAGNOSIS — E782 Mixed hyperlipidemia: Secondary | ICD-10-CM

## 2024-07-16 DIAGNOSIS — I2510 Atherosclerotic heart disease of native coronary artery without angina pectoris: Secondary | ICD-10-CM

## 2024-07-16 DIAGNOSIS — I10 Essential (primary) hypertension: Secondary | ICD-10-CM

## 2024-07-16 DIAGNOSIS — I214 Non-ST elevation (NSTEMI) myocardial infarction: Secondary | ICD-10-CM

## 2024-07-17 LAB — COMPLETE METABOLIC PANEL WITHOUT GFR
AG Ratio: 1.6 (calc) (ref 1.0–2.5)
ALT: 12 U/L (ref 6–29)
AST: 17 U/L (ref 10–35)
Albumin: 4.1 g/dL (ref 3.6–5.1)
Alkaline phosphatase (APISO): 52 U/L (ref 37–153)
BUN: 11 mg/dL (ref 7–25)
CO2: 29 mmol/L (ref 20–32)
Calcium: 9.6 mg/dL (ref 8.6–10.4)
Chloride: 94 mmol/L — ABNORMAL LOW (ref 98–110)
Creat: 0.78 mg/dL (ref 0.60–0.95)
Globulin: 2.6 g/dL (ref 1.9–3.7)
Glucose, Bld: 117 mg/dL — ABNORMAL HIGH (ref 65–99)
Potassium: 4.8 mmol/L (ref 3.5–5.3)
Sodium: 131 mmol/L — ABNORMAL LOW (ref 135–146)
Total Bilirubin: 0.6 mg/dL (ref 0.2–1.2)
Total Protein: 6.7 g/dL (ref 6.1–8.1)

## 2024-07-17 LAB — TSH: TSH: 1.35 m[IU]/L (ref 0.40–4.50)

## 2024-07-17 LAB — CBC WITH DIFFERENTIAL/PLATELET
Absolute Lymphocytes: 1555 {cells}/uL (ref 850–3900)
Absolute Monocytes: 506 {cells}/uL (ref 200–950)
Basophils Absolute: 58 {cells}/uL (ref 0–200)
Basophils Relative: 0.9 %
Eosinophils Absolute: 198 {cells}/uL (ref 15–500)
Eosinophils Relative: 3.1 %
HCT: 40.8 % (ref 35.0–45.0)
Hemoglobin: 13.6 g/dL (ref 11.7–15.5)
MCH: 33.3 pg — ABNORMAL HIGH (ref 27.0–33.0)
MCHC: 33.3 g/dL (ref 32.0–36.0)
MCV: 100 fL (ref 80.0–100.0)
MPV: 9.8 fL (ref 7.5–12.5)
Monocytes Relative: 7.9 %
Neutro Abs: 4083 {cells}/uL (ref 1500–7800)
Neutrophils Relative %: 63.8 %
Platelets: 230 Thousand/uL (ref 140–400)
RBC: 4.08 Million/uL (ref 3.80–5.10)
RDW: 12 % (ref 11.0–15.0)
Total Lymphocyte: 24.3 %
WBC: 6.4 Thousand/uL (ref 3.8–10.8)

## 2024-07-17 LAB — LIPID PANEL
Cholesterol: 157 mg/dL (ref <200)
HDL: 69 mg/dL (ref >=50)
LDL Cholesterol (Calc): 65 mg/dL
Non-HDL Cholesterol (Calc): 88 mg/dL (ref <130)
Total CHOL/HDL Ratio: 2.3 (calc) (ref <5.0)
Triglycerides: 148 mg/dL (ref <150)

## 2024-07-19 ENCOUNTER — Ambulatory Visit: Payer: Medicare Other | Admitting: *Deleted

## 2024-07-19 ENCOUNTER — Ambulatory Visit (INDEPENDENT_AMBULATORY_CARE_PROVIDER_SITE_OTHER): Payer: Medicare Other | Admitting: Family Medicine

## 2024-07-19 ENCOUNTER — Encounter: Payer: Self-pay | Admitting: Family Medicine

## 2024-07-19 VITALS — Ht 67.0 in | Wt 186.0 lb

## 2024-07-19 VITALS — BP 130/70 | HR 68 | Temp 98.7°F | Ht 67.0 in | Wt 187.5 lb

## 2024-07-19 DIAGNOSIS — D2370 Other benign neoplasm of skin of unspecified lower limb, including hip: Secondary | ICD-10-CM | POA: Insufficient documentation

## 2024-07-19 DIAGNOSIS — E7800 Pure hypercholesterolemia, unspecified: Secondary | ICD-10-CM

## 2024-07-19 DIAGNOSIS — E871 Hypo-osmolality and hyponatremia: Secondary | ICD-10-CM | POA: Diagnosis not present

## 2024-07-19 DIAGNOSIS — Z0000 Encounter for general adult medical examination without abnormal findings: Secondary | ICD-10-CM

## 2024-07-19 DIAGNOSIS — Z808 Family history of malignant neoplasm of other organs or systems: Secondary | ICD-10-CM | POA: Insufficient documentation

## 2024-07-19 DIAGNOSIS — D225 Melanocytic nevi of trunk: Secondary | ICD-10-CM | POA: Insufficient documentation

## 2024-07-19 DIAGNOSIS — I214 Non-ST elevation (NSTEMI) myocardial infarction: Secondary | ICD-10-CM

## 2024-07-19 DIAGNOSIS — Z85828 Personal history of other malignant neoplasm of skin: Secondary | ICD-10-CM | POA: Insufficient documentation

## 2024-07-19 DIAGNOSIS — I2510 Atherosclerotic heart disease of native coronary artery without angina pectoris: Secondary | ICD-10-CM | POA: Diagnosis not present

## 2024-07-19 DIAGNOSIS — L821 Other seborrheic keratosis: Secondary | ICD-10-CM | POA: Insufficient documentation

## 2024-07-19 MED ORDER — TRIMETHOPRIM 100 MG PO TABS: 100.0000 mg | ORAL_TABLET | Freq: Every day | ORAL | 3 refills | Status: AC

## 2024-07-19 MED ORDER — CARVEDILOL PHOSPHATE ER 40 MG PO CP24
ORAL_CAPSULE | ORAL | 3 refills | Status: AC
Start: 2024-07-19 — End: ?

## 2024-07-19 MED ORDER — ROSUVASTATIN CALCIUM 20 MG PO TABS: 20.0000 mg | ORAL_TABLET | Freq: Every day | ORAL | 3 refills | Status: AC

## 2024-07-19 MED ORDER — CLOPIDOGREL BISULFATE 75 MG PO TABS: 75.0000 mg | ORAL_TABLET | Freq: Every day | ORAL | 3 refills | Status: AC

## 2024-07-19 MED ORDER — HYDROCHLOROTHIAZIDE 12.5 MG PO CAPS: 12.5000 mg | ORAL_CAPSULE | Freq: Every day | ORAL | 3 refills | Status: DC

## 2024-07-19 MED ORDER — ISOSORBIDE MONONITRATE ER 60 MG PO TB24: 60.0000 mg | ORAL_TABLET | Freq: Every day | ORAL | 3 refills | Status: AC

## 2024-07-19 NOTE — Progress Notes (Signed)
 Subjective:    Patient ID: Lindsey Erickson, female    DOB: 12/18/1931, 89 y.o.   MRN: [id number]  HPI   Patient is a very pleasant 89 year old white female who is here for CPE. She has a significant past medical history of coronary artery disease. According to her report she is suffered 2 separate myocardial infarctions. However on repeat catheterizations, the patient states that she has lesions which are not amenable to stenting. Therefore she is being managed medically. She is currently on a long-acting nitroglycerin , beta blocker, and ACE inhibitor, statin medication, and dual antiplatelet therapy.  She denies any depression.  She denies any memory loss.  Her memory has recovered dramatically since starting parenteral vitamin B12.  She denies any falls.  Given her age she does not require Pap smear or colonoscopy.  We discussed capvaxive.  Patient is due for the shingles vaccine, flu shot, and a COVID shot.  She is also due for the RSV vaccine.  She declines a mammogram.  She denies a bone density.  Her most recent lab work is significant for a sodium level of 131.  An LDL cholesterol of 65.  And HDL cholesterol greater than 60. Past Medical History:  Diagnosis Date   Anemia    Angio-edema    Arthritis    Blood transfusion without reported diagnosis 1957   CAD (coronary artery disease)    a. NSTEMI 2004 with thrombus in Cx and mod RCA. b. Nonischemic nuc 2008. c. mild NSTEMI 01/2013 (trop 0.42) -> cath with unchanged mod LAD & RCA disease, for continued medical therapy.   Cancer (HCC)    Cataract    Dementia (HCC)    has improved-suspect b12 deficincy and concussion were to blame   H/O Hashimoto thyroiditis    Hyperlipidemia    HYPERTENSION    a. Also has h/o intermittentl low BP.   Hypothyroidism    Kidney stones    Myocardial infarction [facility])    Osteoporosis    no fractures   Prediabetes    QT prolongation    a. Prior EKGs QTc 474, but on 4/1 was 524 for unclear reason.    Recurrent urinary tract infection    Squamous cell carcinoma of skin 04/17/2015   Urticaria    Past Surgical History:  Procedure Laterality Date   ADENOIDECTOMY     APPENDECTOMY  1999   Bilateral Arthroscopies of knees     CARDIAC CATHETERIZATION     Moderate 3 vessel CAD by cath 05/2012   CHOLECYSTECTOMY  02/2010   JOINT REPLACEMENT  01/2010   L TKR - allusio   LEFT HEART CATHETERIZATION WITH CORONARY ANGIOGRAM N/A 05/22/2012   Procedure: LEFT HEART CATHETERIZATION WITH CORONARY ANGIOGRAM;  Surgeon: Victory LELON Claudene DOUGLAS, MD;  Location: [facility] CATH LAB;  Service: Cardiovascular;  Laterality: N/A;   LEFT HEART CATHETERIZATION WITH CORONARY ANGIOGRAM N/A 02/20/2014   Procedure: LEFT HEART CATHETERIZATION WITH CORONARY ANGIOGRAM;  Surgeon: Peter M Swaziland, MD;  Location: [facility] CATH LAB;  Service: Cardiovascular;  Laterality: N/A;   TONSILLECTOMY     TUBAL LIGATION     Current Outpatient Medications on File Prior to Visit  Medication Sig Dispense Refill   acetaminophen  (TYLENOL ) 500 MG tablet Take 500 mg by mouth every 6 (six) hours as needed for moderate pain.     carvedilol  (COREG  CR) 40 MG 24 hr capsule Take 1 capsule every evening. 90 capsule 3   clopidogrel  (PLAVIX ) 75 MG tablet TAKE 1 TABLET DAILY 30  tablet 11   conjugated estrogens (PREMARIN) vaginal cream Place 1 Applicatorful vaginally 2 (two) times a week. Tuesday and Saturday.     EPINEPHrine  0.3 mg/0.3 mL IJ SOAJ injection SMARTSIG:0.3 Milliliter(s) IM Once PRN 1 each 5   famotidine  (PEPCID ) 20 MG tablet Take 1 tablet (20 mg total) by mouth 2 (two) times daily. (Patient not taking: Reported on 07/19/2024) 180 tablet 1   fexofenadine  (ALLEGRA ) 180 MG tablet Take 1 tablet (180 mg total) by mouth 2 (two) times daily. (Patient not taking: Reported on 07/19/2024) 180 tablet 5   hydrochlorothiazide  (MICROZIDE ) 12.5 MG capsule Take 1 capsule (12.5 mg total) by mouth daily. 90 capsule 2   isosorbide  mononitrate (IMDUR ) 60 MG 24 hr tablet TAKE 1 TABLET  DAILY 90 tablet 3   Multiple Vitamins-Minerals (CENTRUM SILVER 50+WOMEN PO) Take 1 tablet by mouth daily.     nitroGLYCERIN  (NITROSTAT ) 0.4 MG SL tablet DISSOLVE 1 TABLET UNDER THE TONGUE EVERY 5 MINUTES AS NEEDED FOR CHEST PAIN 75 tablet 3   OVER THE COUNTER MEDICATION Take 1 capsule by mouth daily. Cranberry D - [place]     rosuvastatin  (CRESTOR ) 20 MG tablet TAKE 1 TABLET AT BEDTIME 90 tablet 3   SYNTHROID  50 MCG tablet TAKE 1 TABLET ALTERNATING WITH ONE AND ONE-HALF TABLETS AS DIRECTED 108 tablet 3   trimethoprim  (TRIMPEX ) 100 MG tablet Take 100 mg by mouth daily.     No current facility-administered medications on file prior to visit.   Allergies  Allergen Reactions   Ciprofloxacin  Hives   Other Other (See Comments)    Frozen Blood Plasma-caused patient to crash / cardiac arrest / CAN NEVER TAKE THIS AGAIN-PER MD'S.   ALL NARCOTICS- NAUSEA AND VOMITING    Adhesive [Tape] Itching, Swelling, Rash and Other (See Comments)    Also pain at location   Codeine Nausea And Vomiting   Bactrim  [Sulfamethoxazole -Trimethoprim ]     Muscle aches   Cephalexin  Hives   Elemental Sulfur Hives and Nausea Only   Lisinopril  Swelling   Macrobid [Nitrofurantoin Monohyd Macro] Nausea And Vomiting   Tamiflu  [Oseltamivir  Phosphate]    Social History   Socioeconomic History   Marital status: Married    Spouse name: Not on file   Number of children: Not on file   Years of education: Not on file   Highest education level: Not on file  Occupational History   Not on file  Tobacco Use   Smoking status: Never   Smokeless tobacco: Never  Vaping Use   Vaping status: Never Used  Substance and Sexual Activity   Alcohol use: Not Currently   Drug use: Never   Sexual activity: Not Currently  Other Topics Concern   Not on file  Social History Narrative   ** Merged History Encounter **       Married, 2 children. Retired from [employer] [place]   Social Drivers of Health   Financial Resource Strain: Low  Risk  (07/19/2024)   Overall Financial Resource Strain (CARDIA)    Difficulty of Paying Living Expenses: Not hard at all  Food Insecurity: No Food Insecurity (07/19/2024)   Hunger Vital Sign    Worried About Running Out of Food in the Last Year: Never true    Ran Out of Food in the Last Year: Never true  Transportation Needs: No Transportation Needs (07/19/2024)   PRAPARE - Administrator, Civil Service (Medical): No    Lack of Transportation (Non-Medical): No  Physical Activity: Inactive (07/19/2024)  Exercise Vital Sign    Days of Exercise per Week: 0 days    Minutes of Exercise per Session: 0 min  Stress: No Stress Concern Present (07/19/2024)   [employer] of Occupational Health - Occupational Stress Questionnaire    Feeling of Stress: Not at all  Social Connections: Moderately Integrated (07/19/2024)   Social Connection and Isolation Panel    Frequency of Communication with Friends and Family: More than three times a week    Frequency of Social Gatherings with Friends and Family: Twice a week    Attends Religious Services: More than 4 times per year    Active Member of [employer] or Organizations: No    Attends Banker Meetings: Never    Marital Status: Married  Catering manager Violence: Not At Risk (07/19/2024)   Humiliation, Afraid, Rape, and Kick questionnaire    Fear of Current or Ex-Partner: No    Emotionally Abused: No    Physically Abused: No    Sexually Abused: No   Family History  Problem Relation Age of Onset   Allergies Father    Cancer Father    Coronary artery disease Mother 57   Heart disease Mother    CAD Sister 40   Cancer Sister    AAA (abdominal aortic aneurysm) Sister    Hypertension Sister    Hyperlipidemia Sister    Diabetes Sister    Heart disease Sister    Diabetes Maternal Aunt    Allergic rhinitis Neg Hx    Asthma Neg Hx    Eczema Neg Hx    Urticaria Neg Hx       Review of Systems  All other systems reviewed and  are negative.      Objective:   Physical Exam Vitals reviewed.  Constitutional:      General: She is not in acute distress.    Appearance: She is well-developed. She is not diaphoretic.  Neck:     Thyroid : Thyromegaly present.     Vascular: No JVD.     Trachea: No tracheal deviation.  Cardiovascular:     Rate and Rhythm: Normal rate and regular rhythm.     Heart sounds: Normal heart sounds. No murmur heard. Pulmonary:     Effort: Pulmonary effort is normal. No respiratory distress.     Breath sounds: Normal breath sounds. No wheezing or rales.  Abdominal:     General: Bowel sounds are normal. There is no distension.     Palpations: Abdomen is soft.     Tenderness: There is no abdominal tenderness. There is no guarding or rebound.  Musculoskeletal:     Cervical back: Neck supple.  Lymphadenopathy:     Cervical: No cervical adenopathy.  Neurological:     Mental Status: She is alert and oriented to person, place, and time.     Cranial Nerves: No cranial nerve deficit.     Motor: No abnormal muscle tone.     Coordination: Coordination normal.     Deep Tendon Reflexes: Reflexes are normal and symmetric.     Patient has a large goiter     Assessment & Plan:  General medical exam  Coronary artery disease involving native coronary artery of native heart without angina pectoris - Plan: rosuvastatin  (CRESTOR ) 20 MG tablet, carvedilol  (COREG  CR) 40 MG 24 hr capsule, Basic Metabolic Panel Without GFR  NSTEMI (non-ST elevated myocardial infarction) (HCC) - Plan: rosuvastatin  (CRESTOR ) 20 MG tablet  Pure hypercholesterolemia - Plan: rosuvastatin  (CRESTOR ) 20 MG tablet  Hyponatremia Lab on 07/16/2024  Component Date Value Ref Range Status   TSH 07/16/2024 1.35  0.40 - 4.50 mIU/L Final   WBC 07/16/2024 6.4  3.8 - 10.8 Thousand/uL Final   RBC 07/16/2024 4.08  3.80 - 5.10 Million/uL Final   Hemoglobin 07/16/2024 13.6  11.7 - 15.5 g/dL Final   HCT 91/74/7974 40.8  35.0 - 45.0 %  Final   MCV 07/16/2024 100.0  80.0 - 100.0 fL Final   MCH 07/16/2024 33.3 (H)  27.0 - 33.0 pg Final   MCHC 07/16/2024 33.3  32.0 - 36.0 g/dL Final   Comment: For adults, a slight decrease in the calculated MCHC value (in the range of 30 to 32 g/dL) is most likely not clinically significant; however, it should be interpreted with caution in correlation with other red cell parameters and the patient's clinical condition.    RDW 07/16/2024 12.0  11.0 - 15.0 % Final   Platelets 07/16/2024 230  140 - 400 Thousand/uL Final   MPV 07/16/2024 9.8  7.5 - 12.5 fL Final   Neutro Abs 07/16/2024 4,083  1,500 - 7,800 cells/uL Final   Absolute Lymphocytes 07/16/2024 1,555  850 - 3,900 cells/uL Final   Absolute Monocytes 07/16/2024 506  200 - 950 cells/uL Final   Eosinophils Absolute 07/16/2024 198  15 - 500 cells/uL Final   Basophils Absolute 07/16/2024 58  0 - 200 cells/uL Final   Neutrophils Relative % 07/16/2024 63.8  % Final   Total Lymphocyte 07/16/2024 24.3  % Final   Monocytes Relative 07/16/2024 7.9  % Final   Eosinophils Relative 07/16/2024 3.1  % Final   Basophils Relative 07/16/2024 0.9  % Final   Cholesterol 07/16/2024 157  <200 mg/dL Final   HDL 91/74/7974 69  > OR = 50 mg/dL Final   Triglycerides 91/74/7974 148  <150 mg/dL Final   LDL Cholesterol (Calc) 07/16/2024 65  mg/dL (calc) Final   Comment: Reference range: <100 . Desirable range <100 mg/dL for primary prevention;   <70 mg/dL for patients with CHD or diabetic patients  with > or = 2 CHD risk factors. [name] LDL-C is now calculated using the Martin-Hopkins  calculation, which is a validated novel method providing  better accuracy than the Friedewald equation in the  estimation of LDL-C.  Gladis APPLETHWAITE et al. SANDREA. 7986;689(80): 2061-2068  (http://education.[facility].com/faq/FAQ164)    Total CHOL/HDL Ratio 07/16/2024 2.3  <4.9 (calc) Final   Non-HDL Cholesterol (Calc) 07/16/2024 88  <130 mg/dL (calc) Final   Comment: For  patients with diabetes plus 1 major ASCVD risk  factor, treating to a non-HDL-C goal of <100 mg/dL  (LDL-C of <29 mg/dL) is considered a therapeutic  option.    Glucose, Bld 07/16/2024 117 (H)  65 - 99 mg/dL Final   Comment: .            Fasting reference interval . For someone without known diabetes, a glucose value between 100 and 125 mg/dL is consistent with prediabetes and should be confirmed with a follow-up test. .    BUN 07/16/2024 11  7 - 25 mg/dL Final   Creat 91/74/7974 0.78  0.60 - 0.95 mg/dL Final   BUN/Creatinine Ratio 07/16/2024 SEE NOTE:  6 - 22 (calc) Final   Comment:    Not Reported: BUN and Creatinine are within    reference range. .    Sodium 07/16/2024 131 (L)  135 - 146 mmol/L Final   Potassium 07/16/2024 4.8  3.5 - 5.3 mmol/L Final  Chloride 07/16/2024 94 (L)  98 - 110 mmol/L Final   CO2 07/16/2024 29  20 - 32 mmol/L Final   Calcium  07/16/2024 9.6  8.6 - 10.4 mg/dL Final   Total Protein 91/74/7974 6.7  6.1 - 8.1 g/dL Final   Albumin 91/74/7974 4.1  3.6 - 5.1 g/dL Final   Globulin 91/74/7974 2.6  1.9 - 3.7 g/dL (calc) Final   AG Ratio 07/16/2024 1.6  1.0 - 2.5 (calc) Final   Total Bilirubin 07/16/2024 0.6  0.2 - 1.2 mg/dL Final   Alkaline phosphatase (APISO) 07/16/2024 52  37 - 153 U/L Final   AST 07/16/2024 17  10 - 35 U/L Final   ALT 07/16/2024 12  6 - 29 U/L Final   Patient denies drinking excessive fluids.  She has hyponatremia.  I suspect that this is due to SIADH versus hydrochlorothiazide .  Verify hyponatremia with a BMP today.  If persistently low sodium, would recommend stopping hydrochlorothiazide  and have the patient monitor her blood pressure to see if we need to replace this with something else.  LDL cholesterol is 65 but given her good HDL cholesterol I do not feel additional medication is necessary.  She declines a mammogram and a bone density test.  I recommended she consider the new pneumonia vaccine along with a flu shot, a COVID shot, and  the RSV vaccine.  We discussed the shingles vaccine.  She defers the vaccines for now

## 2024-07-19 NOTE — Progress Notes (Signed)
 Subjective:   Lindsey Erickson is a 89 y.o. female who presents for Medicare Annual (Subsequent) preventive examination.  Visit Complete: Virtual I connected with  Lindsey Erickson on 07/19/24 by a audio enabled telemedicine application and verified that I am speaking with the correct person using two identifiers.  Patient Location: Home  Provider Location: Home Office  I discussed the limitations of evaluation and management by telemedicine. The patient expressed understanding and agreed to proceed.  Vital Signs: Because this visit was a virtual/telehealth visit, some criteria may be missing or patient reported. Any vitals not documented were not able to be obtained and vitals that have been documented are patient reported.   Cardiac Risk Factors include: advanced age (>88men, >39 women);hypertension;obesity (BMI >30kg/m2);family history of premature cardiovascular disease     Objective:    Today's Vitals   07/19/24 1305  Weight: 186 lb (84.4 kg)  Height: 5' 7 (1.702 m)   Body mass index is 29.13 kg/m.     07/19/2024    1:06 PM 07/14/2023    5:42 PM 07/10/2021    8:20 AM 07/08/2020    3:27 PM 04/30/2020    5:46 PM 12/14/2018    6:24 AM 09/18/2017    1:48 PM  Advanced Directives  Does Patient Have a Medical Advance Directive? Yes Yes Yes Yes No No  Yes   Type of Estate agent of [employer] Power of [place];Living will Healthcare Power of [employer] of [place];Living will   Living will  Does patient want to make changes to medical advance directive?  No - Patient declined No - Patient declined      Copy of Healthcare Power of Attorney in Chart? No - copy requested Yes - validated most recent copy scanned in chart (See row information) No - copy requested      Would patient like information on creating a medical advance directive?     No - Patient declined       Data saved with a previous flowsheet row definition    Current  Medications (verified) Outpatient Encounter Medications as of 07/19/2024  Medication Sig   acetaminophen  (TYLENOL ) 500 MG tablet Take 500 mg by mouth every 6 (six) hours as needed for moderate pain.   carvedilol  (COREG  CR) 40 MG 24 hr capsule Take 1 capsule every evening.   clopidogrel  (PLAVIX ) 75 MG tablet TAKE 1 TABLET DAILY   conjugated estrogens (PREMARIN) vaginal cream Place 1 Applicatorful vaginally 2 (two) times a week. Tuesday and Saturday.   EPINEPHrine  0.3 mg/0.3 mL IJ SOAJ injection SMARTSIG:0.3 Milliliter(s) IM Once PRN   hydrochlorothiazide  (MICROZIDE ) 12.5 MG capsule Take 1 capsule (12.5 mg total) by mouth daily.   isosorbide  mononitrate (IMDUR ) 60 MG 24 hr tablet TAKE 1 TABLET DAILY   Multiple Vitamins-Minerals (CENTRUM SILVER 50+WOMEN PO) Take 1 tablet by mouth daily.   nitroGLYCERIN  (NITROSTAT ) 0.4 MG SL tablet DISSOLVE 1 TABLET UNDER THE TONGUE EVERY 5 MINUTES AS NEEDED FOR CHEST PAIN   OVER THE COUNTER MEDICATION Take 1 capsule by mouth daily. Cranberry D - [place]   rosuvastatin  (CRESTOR ) 20 MG tablet TAKE 1 TABLET AT BEDTIME   SYNTHROID  50 MCG tablet TAKE 1 TABLET ALTERNATING WITH ONE AND ONE-HALF TABLETS AS DIRECTED   trimethoprim  (TRIMPEX ) 100 MG tablet Take 100 mg by mouth daily.   famotidine  (PEPCID ) 20 MG tablet Take 1 tablet (20 mg total) by mouth 2 (two) times daily. (Patient not taking: Reported on 07/19/2024)   fexofenadine  (  ALLEGRA ) 180 MG tablet Take 1 tablet (180 mg total) by mouth 2 (two) times daily. (Patient not taking: Reported on 07/19/2024)   No facility-administered encounter medications on file as of 07/19/2024.    Allergies (verified) Ciprofloxacin , Other, Adhesive [tape], Codeine, Bactrim  [sulfamethoxazole -trimethoprim ], Cephalexin , Elemental sulfur, Lisinopril , Macrobid [nitrofurantoin monohyd macro], and Tamiflu  [oseltamivir  phosphate]   History: Past Medical History:  Diagnosis Date   Anemia    Angio-edema    Arthritis    Blood transfusion  without reported diagnosis 1957   CAD (coronary artery disease)    a. NSTEMI 2004 with thrombus in Cx and mod RCA. b. Nonischemic nuc 2008. c. mild NSTEMI 01/2013 (trop 0.42) -> cath with unchanged mod LAD & RCA disease, for continued medical therapy.   Cancer (HCC)    Cataract    Dementia (HCC)    has improved-suspect b12 deficincy and concussion were to blame   H/O Hashimoto thyroiditis    Hyperlipidemia    HYPERTENSION    a. Also has h/o intermittentl low BP.   Hypothyroidism    Kidney stones    Myocardial infarction [facility])    Osteoporosis    no fractures   Prediabetes    QT prolongation    a. Prior EKGs QTc 474, but on 4/1 was 524 for unclear reason.   Recurrent urinary tract infection    Squamous cell carcinoma of skin 04/17/2015   Urticaria    Past Surgical History:  Procedure Laterality Date   ADENOIDECTOMY     APPENDECTOMY  1999   Bilateral Arthroscopies of knees     CARDIAC CATHETERIZATION     Moderate 3 vessel CAD by cath 05/2012   CHOLECYSTECTOMY  02/2010   JOINT REPLACEMENT  01/2010   L TKR - allusio   LEFT HEART CATHETERIZATION WITH CORONARY ANGIOGRAM N/A 05/22/2012   Procedure: LEFT HEART CATHETERIZATION WITH CORONARY ANGIOGRAM;  Surgeon: Victory LELON Claudene DOUGLAS, MD;  Location: [facility] CATH LAB;  Service: Cardiovascular;  Laterality: N/A;   LEFT HEART CATHETERIZATION WITH CORONARY ANGIOGRAM N/A 02/20/2014   Procedure: LEFT HEART CATHETERIZATION WITH CORONARY ANGIOGRAM;  Surgeon: Peter M Swaziland, MD;  Location: [facility] C[facility] LAB;  Service: Cardiovascular;  Laterality: N/A;   TONSILLECTOMY     TUBAL LIGATION     Family History  Problem Relation Age of Onset   Allergies Father    Cancer Father    Coronary artery disease Mother 72   Heart disease Mother    CAD Sister 56   Cancer Sister    AAA (abdominal aortic aneurysm) Sister    Hypertension Sister    Hyperlipidemia Sister    Diabetes Sister    Heart disease Sister    Diabetes Maternal Aunt    Allergic rhinitis Neg Hx    Asthma  Neg Hx    Eczema Neg Hx    Urticaria Neg Hx    Social History   Socioeconomic History   Marital status: Married    Spouse name: Not on file   Number of children: Not on file   Years of education: Not on file   Highest education level: Not on file  Occupational History   Not on file  Tobacco Use   Smoking status: Never   Smokeless tobacco: Never  Vaping Use   Vaping status: Never Used  Substance and Sexual Activity   Alcohol use: Not Currently   Drug use: Never   Sexual activity: Not Currently  Other Topics Concern   Not on file  Social History Narrative   **  Merged History Encounter **       Married, 2 children. Retired from [employer] [place]   Social Drivers of Health   Financial Resource Strain: Low Risk  (07/19/2024)   Overall Financial Resource Strain (CARDIA)    Difficulty of Paying Living Expenses: Not hard at all  Food Insecurity: No Food Insecurity (07/19/2024)   Hunger Vital Sign    Worried About Running Out of Food in the Last Year: Never true    Ran Out of Food in the Last Year: Never true  Transportation Needs: No Transportation Needs (07/19/2024)   PRAPARE - Administrator, Civil Service (Medical): No    Lack of Transportation (Non-Medical): No  Physical Activity: Inactive (07/19/2024)   Exercise Vital Sign    Days of Exercise per Week: 0 days    Minutes of Exercise per Session: 0 min  Stress: No Stress Concern Present (07/19/2024)   [employer] of Occupational Health - Occupational Stress Questionnaire    Feeling of Stress: Not at all  Social Connections: Moderately Integrated (07/19/2024)   Social Connection and Isolation Panel    Frequency of Communication with Friends and Family: More than three times a week    Frequency of Social Gatherings with Friends and Family: Twice a week    Attends Religious Services: More than 4 times per year    Active Member of [employer] or Organizations: No    Attends Banker Meetings: Never     Marital Status: Married    Tobacco Counseling Counseling given: Not Answered   Clinical Intake:                        Activities of Daily Living    07/19/2024    1:04 PM  In your present state of health, do you have any difficulty performing the following activities:  Hearing? 0  Vision? 0  Difficulty concentrating or making decisions? 0  Walking or climbing stairs? 0  Dressing or bathing? 0  Doing errands, shopping? 0  Preparing Food and eating ? N  Using the Toilet? N  In the past six months, have you accidently leaked urine? Y  Do you have problems with loss of bowel control? N  Managing your Medications? N  Managing your Finances? N  Housekeeping or managing your Housekeeping? N    Patient Care Team: Duanne Butler DASEN, MD as PCP - General (Family Medicine) Court Dorn PARAS, MD as PCP - Cardiology (Cardiology) Melodi Lerner, MD as Consulting Physician (Orthopedic Surgery) Thaddeus Locus, MD (Otolaryngology) Duanne Butler DASEN, MD (Family Medicine)  Indicate any recent Medical Services you may have received from other than [facility] providers in the past year (date may be approximate).     Assessment:   This is a routine wellness examination for [place].  Hearing/Vision screen Hearing Screening - Comments:: No trouble hearing Vision Screening - Comments:: Not up to date Education provided   Goals Addressed             This Visit's Progress    Patient Stated       Remain Active and independent     Remain active and independent   On track      Depression Screen    07/19/2024    1:09 PM 01/02/2024    2:56 PM 07/19/2023    2:51 PM 07/19/2023    2:49 PM 07/14/2023    5:40 PM 07/12/2022    8:15 AM 07/10/2021  8:21 AM  PHQ 2/9 Scores  PHQ - 2 Score 0 0 0 0 0 0 0  PHQ- 9 Score 0          Fall Risk    07/19/2024    1:17 PM 07/19/2024    1:02 PM 01/02/2024    2:56 PM 07/19/2023    2:50 PM 07/19/2023    2:49 PM  Fall Risk   Falls in the past year?  0 0 0 0 0  Number falls in past yr: 0 0 0  0  Injury with Fall? 0 0 0  0  Risk for fall due to :   No Fall Risks  No Fall Risks  Follow up Falls evaluation completed;Education provided;Falls prevention discussed Falls evaluation completed;Education provided;Falls prevention discussed Falls prevention discussed;Falls evaluation completed      MEDICARE RISK AT HOME: Medicare Risk at Home Any stairs in or around the home?: No If so, are there any without handrails?: No Home free of loose throw rugs in walkways, pet beds, electrical cords, etc?: Yes Adequate lighting in your home to reduce risk of falls?: Yes Life alert?: No Use of a cane, walker or w/c?: No Grab bars in the bathroom?: Yes Shower chair or bench in shower?: Yes Elevated toilet seat or a handicapped toilet?: Yes  TIMED UP AND GO:  Was the test performed?  No    Cognitive Function:    05/15/2020    4:52 PM  MMSE - Mini Mental State Exam  Orientation to time 5  Orientation to Place 5  Registration 3  Attention/ Calculation 0  Recall 1  Language- name 2 objects 2  Language- repeat 1  Language- follow 3 step command 3  Language- read & follow direction 1  Write a sentence 1  Copy design 1  Total score 23        07/19/2024    1:06 PM 07/14/2023    5:42 PM 07/08/2020    3:28 PM  6CIT Screen  What Year? 0 points 0 points 0 points  What month? 0 points 0 points 0 points  What time? 0 points 0 points 0 points  Count back from 20 0 points 0 points 0 points  Months in reverse 0 points 0 points 0 points  Repeat phrase 0 points 2 points 0 points  Total Score 0 points 2 points 0 points    Immunizations Immunization History  Administered Date(s) Administered   Fluad Quad(high Dose 65+) 08/02/2019, 08/17/2021, 09/13/2022   Fluad Trivalent(High Dose 65+) 08/15/2023   Influenza Whole 07/23/2009   Influenza,inj,Quad PF,6+ Mos 09/22/2015, 08/17/2016, 08/24/2017, 08/16/2018   Influenza-Unspecified 08/22/2013,  09/02/2014   PFIZER Comirnaty(Gray Top)Covid-19 Tri-Sucrose Vaccine 03/30/2021   PFIZER(Purple Top)SARS-COV-2 Vaccination 12/17/2019, 01/07/2020, 08/25/2020   Pneumococcal Conjugate-13 01/14/2014   Pneumococcal Polysaccharide-23 02/21/2012   Tdap 04/15/2020   Zoster, Live 09/23/2006    TDAP status: Up to date  Flu Vaccine status: Due, Education has been provided regarding the importance of this vaccine. Advised may receive this vaccine at local pharmacy or Health Dept. Aware to provide a copy of the vaccination record if obtained from local pharmacy or Health Dept. Verbalized acceptance and understanding.  Pneumococcal vaccine status: Due, Education has been provided regarding the importance of this vaccine. Advised may receive this vaccine at local pharmacy or Health Dept. Aware to provide a copy of the vaccination record if obtained from local pharmacy or Health Dept. Verbalized acceptance and understanding.  Covid-19 vaccine status: Declined, Education has been  provided regarding the importance of this vaccine but patient still declined. Advised may receive this vaccine at local pharmacy or Health Dept.or vaccine clinic. Aware to provide a copy of the vaccination record if obtained from local pharmacy or Health Dept. Verbalized acceptance and understanding.  Qualifies for Shingles Vaccine? Yes   Zostavax completed No   Shingrix Completed?: No.    Education has been provided regarding the importance of this vaccine. Patient has been advised to call insurance company to determine out of pocket expense if they have not yet received this vaccine. Advised may also receive vaccine at local pharmacy or Health Dept. Verbalized acceptance and understanding.  Screening Tests Health Maintenance  Topic Date Due   Zoster Vaccines- Shingrix (1 of 2) 08/07/1951   INFLUENZA VACCINE  06/22/2024   Medicare Annual Wellness (AWV)  07/19/2025   DTaP/Tdap/Td (2 - Td or Tdap) 04/15/2030   Pneumococcal  Vaccine: 50+ Years  Completed   DEXA SCAN  Completed   HPV VACCINES  Aged Out   Meningococcal B Vaccine  Aged Out   COVID-19 Vaccine  Discontinued    Health Maintenance  Health Maintenance Due  Topic Date Due   Zoster Vaccines- Shingrix (1 of 2) 08/07/1951   INFLUENZA VACCINE  06/22/2024    Colorectal cancer screening: No longer required.   Mammogram status: No longer required due to age.  Bone density   delcined  Lung Cancer Screening: (Low Dose CT Chest recommended if Age 65-80 years, 20 pack-year currently smoking OR have quit w/in 15years.) does not qualify.   Lung Cancer Screening Referral:   Additional Screening:  Hepatitis C Screening: does not qualify  Vision Screening: Recommended annual ophthalmology exams for early detection of glaucoma and other disorders of the eye. Is the patient up to date with their annual eye exam?  No  Who is the provider or what is the name of the office in which the patient attends annual eye exams? Education provided If pt is not established with a provider, would they like to be referred to a provider to establish care? No .   Dental Screening: Recommended annual dental exams for proper oral hygiene   Community Resource Referral / Chronic Care Management: CRR required this visit?  No   CCM required this visit?  No     Plan:     I have personally reviewed and noted the following in the patient's chart:   Medical and social history Use of alcohol, tobacco or illicit drugs  Current medications and supplements including opioid prescriptions. Patient is not currently taking opioid prescriptions. Functional ability and status Nutritional status Physical activity Advanced directives List of other physicians Hospitalizations, surgeries, and ER visits in previous 12 months Vitals Screenings to include cognitive, depression, and falls Referrals and appointments  In addition, I have reviewed and discussed with patient certain  preventive protocols, quality metrics, and best practice recommendations. A written personalized care plan for preventive services as well as general preventive health recommendations were provided to patient.     Mliss Graff, LPN   [id number]   After Visit Summary: (MyChart) Due to this being a telephonic visit, the after visit summary with patients personalized plan was offered to patient via MyChart   Nurse Notes:

## 2024-07-19 NOTE — Patient Instructions (Signed)
 Ms. Busbin , Thank you for taking time to come for your Medicare Wellness Visit. I appreciate your ongoing commitment to your health goals. Please review the following plan we discussed and let me know if I can assist you in the future.   Screening recommendations/referrals: Colonoscopy: no longer required Mammogram:  Bone Density:  Recommended yearly ophthalmology/optometry visit for glaucoma screening and checkup Recommended yearly dental visit for hygiene and checkup  Vaccinations: Influenza vaccine:  Pneumococcal vaccine:  Tdap vaccine:  Shingles vaccine:         Preventive Care 65 Years and Older, Female Preventive care refers to lifestyle choices and visits with your health care provider that can promote health and wellness. What does preventive care include? A yearly physical exam. This is also called an annual well check. Dental exams once or twice a year. Routine eye exams. Ask your health care provider how often you should have your eyes checked. Personal lifestyle choices, including: Daily care of your teeth and gums. Regular physical activity. Eating a healthy diet. Avoiding tobacco and drug use. Limiting alcohol use. Practicing safe sex. Taking low-dose aspirin  every day. Taking vitamin and mineral supplements as recommended by your health care provider. What happens during an annual well check? The services and screenings done by your health care provider during your annual well check will depend on your age, overall health, lifestyle risk factors, and family history of disease. Counseling  Your health care provider may ask you questions about your: Alcohol use. Tobacco use. Drug use. Emotional well-being. Home and relationship well-being. Sexual activity. Eating habits. History of falls. Memory and ability to understand (cognition). Work and work Astronomer. Reproductive health. Screening  You may have the following tests or measurements: Height,  weight, and BMI. Blood pressure. Lipid and cholesterol levels. These may be checked every 5 years, or more frequently if you are over 72 years old. Skin check. Lung cancer screening. You may have this screening every year starting at age 42 if you have a 30-pack-year history of smoking and currently smoke or have quit within the past 15 years. Fecal occult blood test (FOBT) of the stool. You may have this test every year starting at age 46. Flexible sigmoidoscopy or colonoscopy. You may have a sigmoidoscopy every 5 years or a colonoscopy every 10 years starting at age 45. Hepatitis C blood test. Hepatitis B blood test. Sexually transmitted disease (STD) testing. Diabetes screening. This is done by checking your blood sugar (glucose) after you have not eaten for a while (fasting). You may have this done every 1-3 years. Bone density scan. This is done to screen for osteoporosis. You may have this done starting at age 3. Mammogram. This may be done every 1-2 years. Talk to your health care provider about how often you should have regular mammograms. Talk with your health care provider about your test results, treatment options, and if necessary, the need for more tests. Vaccines  Your health care provider may recommend certain vaccines, such as: Influenza vaccine. This is recommended every year. Tetanus, diphtheria, and acellular pertussis (Tdap, Td) vaccine. You may need a Td booster every 10 years. Zoster vaccine. You may need this after age 70. Pneumococcal 13-valent conjugate (PCV13) vaccine. One dose is recommended after age 57. Pneumococcal polysaccharide (PPSV23) vaccine. One dose is recommended after age 75. Talk to your health care provider about which screenings and vaccines you need and how often you need them. This information is not intended to replace advice given to you  by your health care provider. Make sure you discuss any questions you have with your health care  provider. Document Released: 12/05/2015 Document Revised: 07/28/2016 Document Reviewed: 09/09/2015 Elsevier Interactive Patient Education  2017 [employer].  Fall Prevention in the Home Falls can cause injuries. They can happen to people of all ages. There are many things you can do to make your home safe and to help prevent falls. What can I do on the outside of my home? Regularly fix the edges of walkways and driveways and fix any cracks. Remove anything that might make you trip as you walk through a door, such as a raised step or threshold. Trim any bushes or trees on the path to your home. Use bright outdoor lighting. Clear any walking paths of anything that might make someone trip, such as rocks or tools. Regularly check to see if handrails are loose or broken. Make sure that both sides of any steps have handrails. Any raised decks and porches should have guardrails on the edges. Have any leaves, snow, or ice cleared regularly. Use sand or salt on walking paths during winter. Clean up any spills in your garage right away. This includes oil or grease spills. What can I do in the bathroom? Use night lights. Install grab bars by the toilet and in the tub and shower. Do not use towel bars as grab bars. Use non-skid mats or decals in the tub or shower. If you need to sit down in the shower, use a plastic, non-slip stool. Keep the floor dry. Clean up any water that spills on the floor as soon as it happens. Remove soap buildup in the tub or shower regularly. Attach bath mats securely with double-sided non-slip rug tape. Do not have throw rugs and other things on the floor that can make you trip. What can I do in the bedroom? Use night lights. Make sure that you have a light by your bed that is easy to reach. Do not use any sheets or blankets that are too big for your bed. They should not hang down onto the floor. Have a firm chair that has side arms. You can use this for support while  you get dressed. Do not have throw rugs and other things on the floor that can make you trip. What can I do in the kitchen? Clean up any spills right away. Avoid walking on wet floors. Keep items that you use a lot in easy-to-reach places. If you need to reach something above you, use a strong step stool that has a grab bar. Keep electrical cords out of the way. Do not use floor polish or wax that makes floors slippery. If you must use wax, use non-skid floor wax. Do not have throw rugs and other things on the floor that can make you trip. What can I do with my stairs? Do not leave any items on the stairs. Make sure that there are handrails on both sides of the stairs and use them. Fix handrails that are broken or loose. Make sure that handrails are as long as the stairways. Check any carpeting to make sure that it is firmly attached to the stairs. Fix any carpet that is loose or worn. Avoid having throw rugs at the top or bottom of the stairs. If you do have throw rugs, attach them to the floor with carpet tape. Make sure that you have a light switch at the top of the stairs and the bottom of the stairs. If  you do not have them, ask someone to add them for you. What else can I do to help prevent falls? Wear shoes that: Do not have high heels. Have rubber bottoms. Are comfortable and fit you well. Are closed at the toe. Do not wear sandals. If you use a stepladder: Make sure that it is fully opened. Do not climb a closed stepladder. Make sure that both sides of the stepladder are locked into place. Ask someone to hold it for you, if possible. Clearly mark and make sure that you can see: Any grab bars or handrails. First and last steps. Where the edge of each step is. Use tools that help you move around (mobility aids) if they are needed. These include: Canes. Walkers. Scooters. Crutches. Turn on the lights when you go into a dark area. Replace any light bulbs as soon as they burn  out. Set up your furniture so you have a clear path. Avoid moving your furniture around. If any of your floors are uneven, fix them. If there are any pets around you, be aware of where they are. Review your medicines with your doctor. Some medicines can make you feel dizzy. This can increase your chance of falling. Ask your doctor what other things that you can do to help prevent falls. This information is not intended to replace advice given to you by your health care provider. Make sure you discuss any questions you have with your health care provider. Document Released: 09/04/2009 Document Revised: 04/15/2016 Document Reviewed: 12/13/2014 Elsevier Interactive Patient Education  2017 [employer].

## 2024-07-20 ENCOUNTER — Ambulatory Visit: Payer: Self-pay | Admitting: Family Medicine

## 2024-07-20 LAB — BASIC METABOLIC PANEL WITHOUT GFR
BUN: 10 mg/dL (ref 7–25)
CO2: 31 mmol/L (ref 20–32)
Calcium: 9.6 mg/dL (ref 8.6–10.4)
Chloride: 95 mmol/L — ABNORMAL LOW (ref 98–110)
Creat: 0.79 mg/dL (ref 0.60–0.95)
Glucose, Bld: 92 mg/dL (ref 65–99)
Potassium: 4.8 mmol/L (ref 3.5–5.3)
Sodium: 131 mmol/L — ABNORMAL LOW (ref 135–146)

## 2024-07-30 ENCOUNTER — Ambulatory Visit (INDEPENDENT_AMBULATORY_CARE_PROVIDER_SITE_OTHER): Admitting: Family Medicine

## 2024-07-30 DIAGNOSIS — E538 Deficiency of other specified B group vitamins: Secondary | ICD-10-CM | POA: Diagnosis not present

## 2024-07-30 MED ORDER — CYANOCOBALAMIN 1000 MCG/ML IJ SOLN
1000.0000 ug | Freq: Once | INTRAMUSCULAR | Status: AC
  Administered 2024-07-30: 1000 ug via INTRAMUSCULAR

## 2024-07-30 NOTE — Progress Notes (Signed)
 Patient is in office today for a nurse visit for B12 Injection. Patient Injection was given in the  Right deltoid. Patient tolerated injection well.

## 2024-08-27 ENCOUNTER — Ambulatory Visit

## 2024-08-27 DIAGNOSIS — E538 Deficiency of other specified B group vitamins: Secondary | ICD-10-CM

## 2024-08-27 MED ORDER — CYANOCOBALAMIN 1000 MCG/ML IJ SOLN
1000.0000 ug | Freq: Once | INTRAMUSCULAR | Status: AC
  Administered 2024-08-27: 1000 ug via INTRAMUSCULAR

## 2024-08-27 NOTE — Progress Notes (Signed)
 Patient is in office today for a nurse visit for B12 Injection. Patient Injection was given in the  Left deltoid. Patient tolerated injection well.  Vallerie Gave, CMA

## 2024-09-17 ENCOUNTER — Ambulatory Visit (INDEPENDENT_AMBULATORY_CARE_PROVIDER_SITE_OTHER): Admitting: Family Medicine

## 2024-09-17 ENCOUNTER — Encounter: Payer: Self-pay | Admitting: Family Medicine

## 2024-09-17 VITALS — BP 128/82 | HR 70 | Temp 97.6°F | Ht 67.0 in | Wt 187.0 lb

## 2024-09-17 DIAGNOSIS — Z23 Encounter for immunization: Secondary | ICD-10-CM | POA: Diagnosis not present

## 2024-09-17 DIAGNOSIS — M79671 Pain in right foot: Secondary | ICD-10-CM | POA: Insufficient documentation

## 2024-09-17 DIAGNOSIS — H1032 Unspecified acute conjunctivitis, left eye: Secondary | ICD-10-CM | POA: Diagnosis not present

## 2024-09-17 MED ORDER — POLYMYXIN B-TRIMETHOPRIM 10000-0.1 UNIT/ML-% OP SOLN: 2.0000 [drp] | OPHTHALMIC | 0 refills | Status: DC

## 2024-09-17 NOTE — Addendum Note (Signed)
 Addended by: ANGELENA RONAL BRADLEY K on: 09/17/2024 09:03 AM   Modules accepted: Orders

## 2024-09-17 NOTE — Progress Notes (Signed)
 Subjective:    Patient ID: Lindsey Erickson, female    DOB: [birth year], 89 y.o.   MRN: [id number]  Starting Saturday, the patient states that the conjunctiva are of her left eye.  Erythematous.  Start producing clear tears.  It feels itchy and sore.  She denies any blurry vision.  She denies any intense eye pain.  She denies any photophobia.  Pupils are equal round reactive to light. Past Medical History:  Diagnosis Date   Anemia    Angio-edema    Arthritis    Blood transfusion without reported diagnosis 1957   CAD (coronary artery disease)    a. NSTEMI 2004 with thrombus in Cx and mod RCA. b. Nonischemic nuc 2008. c. mild NSTEMI 01/2013 (trop 0.42) -> cath with unchanged mod LAD & RCA disease, for continued medical therapy.   Cancer (HCC)    Cataract    Dementia (HCC)    has improved-suspect b12 deficincy and concussion were to blame   H/O Hashimoto thyroiditis    Hyperlipidemia    HYPERTENSION    a. Also has h/o intermittentl low BP.   Hypothyroidism    Kidney stones    Myocardial infarction [facility])    Osteoporosis    no fractures   Prediabetes    QT prolongation    a. Prior EKGs QTc 474, but on 4/1 was 524 for unclear reason.   Recurrent urinary tract infection    Squamous cell carcinoma of skin 04/17/2015   Urticaria    Past Surgical History:  Procedure Laterality Date   ADENOIDECTOMY     APPENDECTOMY  1999   Bilateral Arthroscopies of knees     CARDIAC CATHETERIZATION     Moderate 3 vessel CAD by cath 05/2012   CHOLECYSTECTOMY  02/2010   JOINT REPLACEMENT  01/2010   L TKR - allusio   LEFT HEART CATHETERIZATION WITH CORONARY ANGIOGRAM N/A 05/22/2012   Procedure: LEFT HEART CATHETERIZATION WITH CORONARY ANGIOGRAM;  Surgeon: Victory LELON Claudene DOUGLAS, MD;  Location: [facility] CATH LAB;  Service: Cardiovascular;  Laterality: N/A;   LEFT HEART CATHETERIZATION WITH CORONARY ANGIOGRAM N/A 02/20/2014   Procedure: LEFT HEART CATHETERIZATION WITH CORONARY ANGIOGRAM;  Surgeon: Peter M Jordan, MD;   Location: [facility] CATH LAB;  Service: Cardiovascular;  Laterality: N/A;   TONSILLECTOMY     TUBAL LIGATION     Current Outpatient Medications on File Prior to Visit  Medication Sig Dispense Refill   acetaminophen  (TYLENOL ) 500 MG tablet Take 500 mg by mouth every 6 (six) hours as needed for moderate pain.     carvedilol  (COREG  CR) 40 MG 24 hr capsule Take 1 capsule every evening. 90 capsule 3   clopidogrel  (PLAVIX ) 75 MG tablet Take 1 tablet (75 mg total) by mouth daily. 90 tablet 3   conjugated estrogens (PREMARIN) vaginal cream Place 1 Applicatorful vaginally 2 (two) times a week. Tuesday and Saturday.     EPINEPHrine  0.3 mg/0.3 mL IJ SOAJ injection SMARTSIG:0.3 Milliliter(s) IM Once PRN 1 each 5   famotidine  (PEPCID ) 20 MG tablet Take 1 tablet (20 mg total) by mouth 2 (two) times daily. (Patient not taking: Reported on 07/19/2024) 180 tablet 1   fexofenadine  (ALLEGRA ) 180 MG tablet Take 1 tablet (180 mg total) by mouth 2 (two) times daily. (Patient not taking: Reported on 07/19/2024) 180 tablet 5   hydrochlorothiazide  (MICROZIDE ) 12.5 MG capsule Take 1 capsule (12.5 mg total) by mouth daily. 90 capsule 3   isosorbide  mononitrate (IMDUR ) 60 MG 24 hr tablet Take  1 tablet (60 mg total) by mouth daily. 90 tablet 3   Multiple Vitamins-Minerals (CENTRUM SILVER 50+WOMEN PO) Take 1 tablet by mouth daily.     nitroGLYCERIN  (NITROSTAT ) 0.4 MG SL tablet DISSOLVE 1 TABLET UNDER THE TONGUE EVERY 5 MINUTES AS NEEDED FOR CHEST PAIN 75 tablet 3   OVER THE COUNTER MEDICATION Take 1 capsule by mouth daily. Cranberry D - [place]     rosuvastatin  (CRESTOR ) 20 MG tablet Take 1 tablet (20 mg total) by mouth at bedtime. 90 tablet 3   SYNTHROID  50 MCG tablet TAKE 1 TABLET ALTERNATING WITH ONE AND ONE-HALF TABLETS AS DIRECTED 108 tablet 3   trimethoprim  (TRIMPEX ) 100 MG tablet Take 1 tablet (100 mg total) by mouth daily. 90 tablet 3   No current facility-administered medications on file prior to visit.   Allergies   Allergen Reactions   Ciprofloxacin  Hives   Other Other (See Comments)    Frozen Blood Plasma-caused patient to crash / cardiac arrest / CAN NEVER TAKE THIS AGAIN-PER MD'S.   ALL NARCOTICS- NAUSEA AND VOMITING    Adhesive [Tape] Itching, Swelling, Rash and Other (See Comments)    Also pain at location   Codeine Nausea And Vomiting   Bactrim  [Sulfamethoxazole -Trimethoprim ]     Muscle aches   Cephalexin  Hives   Elemental Sulfur Hives and Nausea Only   Lisinopril  Swelling   Macrobid [Nitrofurantoin Monohyd Macro] Nausea And Vomiting   Tamiflu  [Oseltamivir  Phosphate]    Social History   Socioeconomic History   Marital status: Married    Spouse name: Not on file   Number of children: Not on file   Years of education: Not on file   Highest education level: Not on file  Occupational History   Not on file  Tobacco Use   Smoking status: Never   Smokeless tobacco: Never  Vaping Use   Vaping status: Never Used  Substance and Sexual Activity   Alcohol use: Not Currently   Drug use: Never   Sexual activity: Not Currently  Other Topics Concern   Not on file  Social History Narrative   ** Merged History Encounter **       Married, 2 children. Retired from [employer]   Social Drivers of Health   Financial Resource Strain: Low Risk  (07/19/2024)   Overall Financial Resource Strain (CARDIA)    Difficulty of Paying Living Expenses: Not hard at all  Food Insecurity: No Food Insecurity (07/19/2024)   Hunger Vital Sign    Worried About Running Out of Food in the Last Year: Never true    Ran Out of Food in the Last Year: Never true  Transportation Needs: No Transportation Needs (07/19/2024)   PRAPARE - Administrator, Civil Service (Medical): No    Lack of Transportation (Non-Medical): No  Physical Activity: Inactive (07/19/2024)   Exercise Vital Sign    Days of Exercise per Week: 0 days    Minutes of Exercise per Session: 0 min  Stress: No Stress Concern Present  (07/19/2024)   [employer] of Occupational Health - Occupational Stress Questionnaire    Feeling of Stress: Not at all  Social Connections: Moderately Integrated (07/19/2024)   Social Connection and Isolation Panel    Frequency of Communication with Friends and Family: More than three times a week    Frequency of Social Gatherings with Friends and Family: Twice a week    Attends Religious Services: More than 4 times per year    Active Member  of Clubs or Organizations: No    Attends Banker Meetings: Never    Marital Status: Married  Catering Manager Violence: Not At Risk (07/19/2024)   Humiliation, Afraid, Rape, and Kick questionnaire    Fear of Current or Ex-Partner: No    Emotionally Abused: No    Physically Abused: No    Sexually Abused: No   Family History  Problem Relation Age of Onset   Allergies Father    Cancer Father    Coronary artery disease Mother 35   Heart disease Mother    CAD Sister 33   Cancer Sister    AAA (abdominal aortic aneurysm) Sister    Hypertension Sister    Hyperlipidemia Sister    Diabetes Sister    Heart disease Sister    Diabetes Maternal Aunt    Allergic rhinitis Neg Hx    Asthma Neg Hx    Eczema Neg Hx    Urticaria Neg Hx       Review of Systems  All other systems reviewed and are negative.      Objective:   Physical Exam Vitals reviewed.  Constitutional:      General: She is not in acute distress.    Appearance: She is well-developed. She is not diaphoretic.  Eyes:     General: Lids are normal.        Right eye: No foreign body or discharge.        Left eye: Discharge present.No foreign body or hordeolum.     Conjunctiva/sclera:     Left eye: Left conjunctiva is injected. Exudate present.  Neck:     Thyroid : Thyromegaly present.     Vascular: No JVD.     Trachea: No tracheal deviation.  Cardiovascular:     Rate and Rhythm: Normal rate and regular rhythm.     Heart sounds: Normal heart sounds. No murmur  heard. Pulmonary:     Effort: Pulmonary effort is normal. No respiratory distress.     Breath sounds: Normal breath sounds. No wheezing or rales.  Musculoskeletal:     Cervical back: Neck supple.     Right ankle: Normal pulse.     Right Achilles Tendon: No tenderness. Thompson's test negative.     Right foot: Normal range of motion. No deformity.     Left foot: Normal range of motion. No deformity.  Lymphadenopathy:     Cervical: No cervical adenopathy.  Neurological:     Mental Status: She is alert.     Motor: No abnormal muscle tone.     Patient has a large goiter     Assessment & Plan:  Acute bacterial conjunctivitis of left eye Begin Polytrim  drops 2 drops every 6 hours for 5 days.  Patient received her flu shot while she was here today

## 2024-10-01 ENCOUNTER — Ambulatory Visit (INDEPENDENT_AMBULATORY_CARE_PROVIDER_SITE_OTHER)

## 2024-10-01 DIAGNOSIS — E538 Deficiency of other specified B group vitamins: Secondary | ICD-10-CM

## 2024-10-01 MED ORDER — CYANOCOBALAMIN 1000 MCG/ML IJ SOLN
1000.0000 ug | Freq: Once | INTRAMUSCULAR | Status: AC
  Administered 2024-10-01: 1000 ug via INTRAMUSCULAR

## 2024-10-01 NOTE — Progress Notes (Signed)
 Patient is in office today for a nurse visit for B12 Injection. Patient Injection was given in the  Left deltoid. Patient tolerated injection well.

## 2024-10-24 ENCOUNTER — Ambulatory Visit: Admitting: Cardiovascular Disease

## 2024-10-29 ENCOUNTER — Ambulatory Visit

## 2024-10-29 DIAGNOSIS — E538 Deficiency of other specified B group vitamins: Secondary | ICD-10-CM

## 2024-10-29 MED ORDER — CYANOCOBALAMIN 1000 MCG/ML IJ SOLN
1000.0000 ug | Freq: Once | INTRAMUSCULAR | Status: AC
  Administered 2024-10-29: 1000 ug via INTRAMUSCULAR

## 2024-10-29 NOTE — Progress Notes (Signed)
 Patient is in office today for a nurse visit for B12 Injection. Patient Injection was given in the  Left deltoid. Patient tolerated injection well.

## 2024-10-30 ENCOUNTER — Ambulatory Visit: Attending: Cardiovascular Disease | Admitting: Cardiovascular Disease

## 2024-10-30 ENCOUNTER — Encounter: Payer: Self-pay | Admitting: Cardiovascular Disease

## 2024-10-30 VITALS — BP 130/76 | HR 77 | Ht 67.0 in | Wt 178.0 lb

## 2024-10-30 DIAGNOSIS — I214 Non-ST elevation (NSTEMI) myocardial infarction: Secondary | ICD-10-CM

## 2024-10-30 DIAGNOSIS — I10 Essential (primary) hypertension: Secondary | ICD-10-CM

## 2024-10-30 DIAGNOSIS — E782 Mixed hyperlipidemia: Secondary | ICD-10-CM

## 2024-10-30 DIAGNOSIS — I2510 Atherosclerotic heart disease of native coronary artery without angina pectoris: Secondary | ICD-10-CM

## 2024-10-30 MED ORDER — NITROGLYCERIN 0.4 MG SL SUBL: SUBLINGUAL_TABLET | SUBLINGUAL | 3 refills | Status: AC

## 2024-10-30 NOTE — Assessment & Plan Note (Signed)
 History of CAD status post non-STEMI in 2004 related to thrombus in the circumflex with moderate RCA disease.  She had catheterization performed/1/15 by Dr. Dann.  Her anatomy at that time was notable for 70% mid RCA lesion, 50 to 60% mid circumflex lesion.  RCA FFR was negative for significance.  She currently denies chest pain or shortness of breath.

## 2024-10-30 NOTE — Assessment & Plan Note (Signed)
 History of essential hypertension her blood pressure measured today at 130/76.  She is on carvedilol .

## 2024-10-30 NOTE — Progress Notes (Signed)
 10/30/2024 Lindsey Erickson   04-12-1932  [id number]  Primary Physician Lindsey Erickson, Lindsey DASEN, MD Primary Cardiologist: Lindsey JINNY Lesches MD Lindsey Erickson, FSCAI  HPI:  Lindsey Erickson is a 89 y.o.   mildly overweight married Caucasian female mother of 2 children , grandmother of 4 grandchildren who was self-referred to be established in my practice. She was previously a cardiology patient of Dr. Esmeralda Erickson. Her new primary care physician is Dr. Charlena Erickson at [facility] family [facility]. I last saw her in the office 10/11/2023.[name]  Her cardiovascular risk factor profile is notable for treated hypertension and hyperlipidemia. She does have a family history his sister who's had stents. Her cardiac history dates back to 2004 when she had a non-STEMI related to thrombus in the circumflex with moderate RCA disease. She had a negative nuclear stress test in 2008 and underwent cardiac catheterization one year ago on 02/20/14 by Dr. VARANASI after being admitted with chest pain and evolving positive enzymes. She gets infrequent nitroglycerin  responsive chest pain. Her anatomy at the time of her last cath was notable for a 70% mid RCA lesion, 50-60% mid-circumflex lesion.  The RCA underwent FFR interrogation which measured 0.9 suggesting that it was not physiologically significant. Since I saw her a year ago she's remained clinically stable without chest pain or shortness of breath. She was seen in the emergency room angioedema related to lisinopril  which was discontinued. Blood pressure at home have remained under good control.   Since I saw her in the office 1 year ago she did well .  She walks around the house without limitation.  She denies chest pain or shortness of breath.   Current Meds  Medication Sig   acetaminophen  (TYLENOL ) 500 MG tablet Take 500 mg by mouth every 6 (six) hours as needed for moderate pain.   carvedilol  (COREG  CR) 40 MG 24 hr capsule Take 1 capsule every evening.   clopidogrel   (PLAVIX ) 75 MG tablet Take 1 tablet (75 mg total) by mouth daily.   conjugated estrogens (PREMARIN) vaginal cream Place 1 Applicatorful vaginally 2 (two) times a week. Tuesday and Saturday.   EPINEPHrine  0.3 mg/0.3 mL IJ SOAJ injection SMARTSIG:0.3 Milliliter(s) IM Once PRN   isosorbide  mononitrate (IMDUR ) 60 MG 24 hr tablet Take 1 tablet (60 mg total) by mouth daily.   Multiple Vitamins-Minerals (CENTRUM SILVER 50+WOMEN PO) Take 1 tablet by mouth daily.   nitroGLYCERIN  (NITROSTAT ) 0.4 MG SL tablet DISSOLVE 1 TABLET UNDER THE TONGUE EVERY 5 MINUTES AS NEEDED FOR CHEST PAIN   rosuvastatin  (CRESTOR ) 20 MG tablet Take 1 tablet (20 mg total) by mouth at bedtime.   SYNTHROID  50 MCG tablet TAKE 1 TABLET ALTERNATING WITH ONE AND ONE-HALF TABLETS AS DIRECTED   trimethoprim  (TRIMPEX ) 100 MG tablet Take 1 tablet (100 mg total) by mouth daily.     Allergies  Allergen Reactions   Ciprofloxacin  Hives   Other Other (See Comments)    Frozen Blood Plasma-caused patient to crash / cardiac arrest / CAN NEVER TAKE THIS AGAIN-PER MD'S.   ALL NARCOTICS- NAUSEA AND VOMITING    Adhesive [Tape] Itching, Swelling, Rash and Other (See Comments)    Also pain at location   Codeine Nausea And Vomiting   Bactrim  [Sulfamethoxazole -Trimethoprim ]     Muscle aches   Cephalexin  Hives   Elemental Sulfur Hives and Nausea Only   Lisinopril  Swelling   Macrobid [Nitrofurantoin Monohyd Macro] Nausea And Vomiting   Tamiflu  [Oseltamivir  Phosphate]  Social History   Socioeconomic History   Marital status: Married    Spouse name: Not on file   Number of children: Not on file   Years of education: Not on file   Highest education level: Not on file  Occupational History   Not on file  Tobacco Use   Smoking status: Never   Smokeless tobacco: Never  Vaping Use   Vaping status: Never Used  Substance and Sexual Activity   Alcohol use: Not Currently   Drug use: Never   Sexual activity: Not Currently  Other Topics  Concern   Not on file  Social History Narrative   ** Merged History Encounter **       Married, 2 children. Retired from [employer]   Social Drivers of Health   Financial Resource Strain: Low Risk  (07/19/2024)   Overall Financial Resource Strain (CARDIA)    Difficulty of Paying Living Expenses: Not hard at all  Food Insecurity: No Food Insecurity (07/19/2024)   Hunger Vital Sign    Worried About Running Out of Food in the Last Year: Never true    Ran Out of Food in the Last Year: Never true  Transportation Needs: No Transportation Needs (07/19/2024)   PRAPARE - Administrator, Civil Service (Medical): No    Lack of Transportation (Non-Medical): No  Physical Activity: Inactive (07/19/2024)   Exercise Vital Sign    Days of Exercise per Week: 0 days    Minutes of Exercise per Session: 0 min  Stress: No Stress Concern Present (07/19/2024)   [employer] of Occupational Health - Occupational Stress Questionnaire    Feeling of Stress: Not at all  Social Connections: Moderately Integrated (07/19/2024)   Social Connection and Isolation Panel    Frequency of Communication with Friends and Family: More than three times a week    Frequency of Social Gatherings with Friends and Family: Twice a week    Attends Religious Services: More than 4 times per year    Active Member of [employer] or Organizations: No    Attends Banker Meetings: Never    Marital Status: Married  Catering Manager Violence: Not At Risk (07/19/2024)   Humiliation, Afraid, Rape, and Kick questionnaire    Fear of Current or Ex-Partner: No    Emotionally Abused: No    Physically Abused: No    Sexually Abused: No     Review of Systems: General: negative for chills, fever, night sweats or weight changes.  Cardiovascular: negative for chest pain, dyspnea on exertion, edema, orthopnea, palpitations, paroxysmal nocturnal dyspnea or shortness of breath Dermatological: negative for rash Respiratory:  negative for cough or wheezing Urologic: negative for hematuria Abdominal: negative for nausea, vomiting, diarrhea, bright red blood per rectum, melena, or hematemesis Neurologic: negative for visual changes, syncope, or dizziness All other systems reviewed and are otherwise negative except as noted above.    Blood pressure 130/76, pulse 77, height 5' 7 (1.702 m), weight 178 lb (80.7 kg), SpO2 97%.  General appearance: alert and no distress Neck: no adenopathy, no carotid bruit, no JVD, supple, symmetrical, trachea midline, and thyroid  not enlarged, symmetric, no tenderness/mass/nodules Lungs: clear to auscultation bilaterally Heart: regular rate and rhythm, S1, S2 normal, no murmur, click, rub or gallop Extremities: extremities normal, atraumatic, no cyanosis or edema Pulses: 2+ and symmetric Skin: Skin color, texture, turgor normal. No rashes or lesions Neurologic: Grossly normal  EKG EKG Interpretation Date/Time:  Tuesday October 30 2024 14:29:39 EST Ventricular  Rate:  74 PR Interval:  150 QRS Duration:  82 QT Interval:  404 QTC Calculation: 448 R Axis:   -10  Text Interpretation: Normal sinus rhythm Low voltage QRS When compared with ECG of 11-Oct-2023 15:40, Criteria for Septal infarct are no longer Present Confirmed by Court Carrier [phone]) on 10/30/2024 2:39:22 PM    ASSESSMENT AND PLAN:   Essential hypertension History of essential hypertension her blood pressure measured today at 130/76.  She is on carvedilol .  CAD (coronary artery disease) History of CAD status post non-STEMI in 2004 related to thrombus in the circumflex with moderate RCA disease.  She had catheterization performed/1/15 by Dr. Dann.  Her anatomy at that time was notable for 70% mid RCA lesion, 50 to 60% mid circumflex lesion.  RCA FFR was negative for significance.  She currently denies chest pain or shortness of breath.  Hyperlipidemia History of hyperlipidemia on statin therapy with lipid  profile performed 07/16/2024 revealing total cholesterol 157, LDL of 65 and HDL 69.     Carrier DOROTHA Court MD [facility] 10/30/2024 2:45 PM

## 2024-10-30 NOTE — Patient Instructions (Signed)

## 2024-10-30 NOTE — Assessment & Plan Note (Signed)
 History of hyperlipidemia on statin therapy with lipid profile performed 07/16/2024 revealing total cholesterol 157, LDL of 65 and HDL 69.

## 2024-11-13 ENCOUNTER — Encounter: Payer: Self-pay | Admitting: Family

## 2024-11-13 ENCOUNTER — Ambulatory Visit: Admitting: Family

## 2024-11-13 ENCOUNTER — Other Ambulatory Visit: Payer: Self-pay

## 2024-11-13 DIAGNOSIS — T783XXD Angioneurotic edema, subsequent encounter: Secondary | ICD-10-CM

## 2024-11-13 MED ORDER — EPINEPHRINE 0.3 MG/0.3ML IJ SOAJ: 0.3000 mg | INTRAMUSCULAR | 1 refills | Status: AC | PRN

## 2024-11-13 MED ORDER — EPINEPHRINE 0.3 MG/0.3ML IJ SOAJ: INTRAMUSCULAR | 5 refills | Status: DC

## 2024-11-13 MED ORDER — FEXOFENADINE HCL 60 MG PO TABS: ORAL_TABLET | ORAL | 1 refills | Status: AC

## 2024-11-13 MED ORDER — PREDNISONE 10 MG PO TABS: ORAL_TABLET | ORAL | 0 refills | Status: DC

## 2024-11-13 MED ORDER — FAMOTIDINE 20 MG PO TABS: ORAL_TABLET | ORAL | 1 refills | Status: AC

## 2024-11-13 NOTE — Progress Notes (Signed)
 "  [address] [place] [address] Dept: [phone]  FOLLOW UP NOTE  Patient ID: Lindsey Erickson, female    DOB: [birth year]  Age: 89 y.o. MRN: [id number] Date of Office Visit: 11/13/2024  Assessment  Chief Complaint: Angioedema (Top lip symptoms x 1 month)  HPI Lindsey Erickson is a 89 year old female who presents today for an acute visit of right upper lip swelling.  She was last seen on September 08, 2022 by Dr. Jeneal for allergic angioedema.  She denies any new diagnosis or surgery since her last office visit.  Angioedema: She reports that she was fine until Thanksgiving of this year.  She ate Thanksgiving at her daughter's house and that night her lip started swelling.  The only thing that was new to her that night was corn and green beans.  She does not know if her daughter used paprika in any of her food.  Since then she has had swelling two other times.  The most recent time being last night.  She woke up sometime last night with her lip feeling thick.  Usually it is her lower left lip that swells, but today it is the top right.  Last night around 7 PM she had half of a blueberry muffin with blueberry cream cheese.  She had fruit for lunch.  She has not taken anything for the swelling.  She reports that her EpiPen  is out of date, but she has 1.  The prednisone  pills that she has on hand are out of date and she does not have Pepcid  or Allegra .  She is not certain what is causing this.  She does remember that one of the other times she had swelling that she maybe had a sandwich with deli ham, cheese, and maybe tomato.  She denies any concomitant cardiorespiratory, gastrointestinal, or rashes/hives.  She would like her epinephrine  autoinjector device to go to [facility] rather than the local pharmacy due to the cost.   Drug Allergies:  Allergies[1]  Review of Systems: Negative except as per HPI   Physical Exam: BP 118/60   Pulse 78   Temp 97.9 F (36.6 C)   SpO2 97%     Physical Exam Constitutional:      Appearance: Normal appearance.  HENT:     Head: Normocephalic and atraumatic.     Comments: Pharynx normal, eyes normal, ears normal, nose normal    Right Ear: Tympanic membrane, ear canal and external ear normal.     Left Ear: Tympanic membrane, ear canal and external ear normal.     Mouth/Throat:     Mouth: Mucous membranes are moist.     Pharynx: Oropharynx is clear.  Eyes:     Conjunctiva/sclera: Conjunctivae normal.  Cardiovascular:     Rate and Rhythm: Regular rhythm.     Heart sounds: Normal heart sounds.  Pulmonary:     Effort: Pulmonary effort is normal.     Breath sounds: Normal breath sounds.     Comments: Lungs clear to auscultation Musculoskeletal:     Cervical back: Neck supple.  Skin:    General: Skin is warm.     Comments: Minimal swelling noted on right upper lip.  No rashes or urticarial lesions noted  Neurological:     Mental Status: She is alert and oriented to person, place, and time.  Psychiatric:        Mood and Affect: Mood normal.        Behavior: Behavior normal.  Thought Content: Thought content normal.        Judgment: Judgment normal.     Diagnostics: None  Assessment and Plan: 1. Angioedema, subsequent encounter     Meds ordered this encounter  Medications   DISCONTD: EPINEPHrine  0.3 mg/0.3 mL IJ SOAJ injection    Sig: SMARTSIG:0.3 Milliliter(s) IM Once PRN    Dispense:  1 each    Refill:  5   predniSONE  (DELTASONE ) 10 MG tablet    Sig: Take 1 tablet twice day for 5 days    Dispense:  10 tablet    Refill:  0   famotidine  (PEPCID ) 20 MG tablet    Sig: Take one tablet once a day as needed for swelling flare ups    Dispense:  30 tablet    Refill:  1   fexofenadine  (ALLEGRA  ALLERGY) 60 MG tablet    Sig: Take one tablet once a day as needed for swelling    Dispense:  30 tablet    Refill:  1   EPINEPHrine  0.3 mg/0.3 mL IJ SOAJ injection    Sig: Inject 0.3 mg into the muscle as needed for  anaphylaxis.    Dispense:  2 each    Refill:  1    Patient Instructions  Allergic Angioedema (lip swelling)   -Uncertain as what is causing your swelling   - avoid foods that contain Paprika in ingredients list.     - If your symptoms re-occur, begin a journal of events that occurred for up to 6 hours before your symptoms began including foods and beverages consumed, soaps or perfumes you had contact with, and medications.  -refill EpiPen  sent. Demonstration given   - can use allegra  and pepcid  for flare-ups as needed   - have access to epipen  device in case of allergic reaction and follow emergency action plan  -I will order a C4 to look for the cause of your swelling. We will call you with results once they are back.  Follow up 2-4 weeks or sooner  Return in about 4 weeks (around 12/11/2024), or if symptoms worsen or fail to improve.    Thank you for the opportunity to care for this patient.  Please do not hesitate to contact me with questions.  Wanda Craze, FNP [facility]         [1]  Allergies Allergen Reactions   Ciprofloxacin  Hives   Other Other (See Comments)    Frozen Blood Plasma-caused patient to crash / cardiac arrest / CAN NEVER TAKE THIS AGAIN-PER MD'S.   ALL NARCOTICS- NAUSEA AND VOMITING    Adhesive [Tape] Itching, Swelling, Rash and Other (See Comments)    Also pain at location   Codeine Nausea And Vomiting   Bactrim  [Sulfamethoxazole -Trimethoprim ]     Muscle aches   Cephalexin  Hives   Elemental Sulfur Hives and Nausea Only   Lisinopril  Swelling   Macrobid [Nitrofurantoin Monohyd Macro] Nausea And Vomiting   Tamiflu  [Oseltamivir  Phosphate]    "

## 2024-11-13 NOTE — Patient Instructions (Addendum)
 Allergic Angioedema (lip swelling)   -Uncertain as what is causing your swelling   - avoid foods that contain Paprika in ingredients list.     - If your symptoms re-occur, begin a journal of events that occurred for up to 6 hours before your symptoms began including foods and beverages consumed, soaps or perfumes you had contact with, and medications.  -refill EpiPen  sent. Demonstration given   - can use allegra  and pepcid  for flare-ups as needed   - have access to epipen  device in case of allergic reaction and follow emergency action plan  -I will order a C4 to look for the cause of your swelling. We will call you with results once they are back.  Follow up 2-4 weeks or sooner

## 2024-11-14 LAB — C4 COMPLEMENT: Complement C4, Serum: 28 mg/dL (ref 12–38)

## 2024-11-19 ENCOUNTER — Ambulatory Visit: Payer: Self-pay | Admitting: Family

## 2024-11-19 NOTE — Progress Notes (Signed)
 Please let Lindsey Erickson know that her lab work checking for hereditary angioedema (C4) was negative. How is her swelling?

## 2024-11-23 NOTE — Progress Notes (Signed)
 Thank you :)

## 2024-12-03 ENCOUNTER — Ambulatory Visit

## 2024-12-03 DIAGNOSIS — E538 Deficiency of other specified B group vitamins: Secondary | ICD-10-CM

## 2024-12-03 MED ORDER — CYANOCOBALAMIN 1000 MCG/ML IJ SOLN
1000.0000 ug | Freq: Once | INTRAMUSCULAR | Status: AC
  Administered 2024-12-03: 1000 ug via INTRAMUSCULAR

## 2024-12-03 NOTE — Progress Notes (Signed)
 Patient is in office today for a nurse visit for B12 Injection. Patient Injection was given in the  Left deltoid. Patient tolerated injection well.

## 2024-12-11 ENCOUNTER — Telehealth: Payer: Self-pay | Admitting: Family

## 2024-12-11 ENCOUNTER — Other Ambulatory Visit: Payer: Self-pay | Admitting: Family

## 2024-12-11 MED ORDER — PREDNISONE 10 MG PO TABS: ORAL_TABLET | ORAL | 0 refills | Status: AC

## 2024-12-11 NOTE — Telephone Encounter (Signed)
 Please see why she is wanting prednisone 

## 2024-12-11 NOTE — Telephone Encounter (Signed)
 Attempted to call patient, there was no answer and was unable to leave a voicemail. Will need to call again.

## 2024-12-11 NOTE — Telephone Encounter (Signed)
 Pt called and stated needs medication predniSONE  (DELTASONE ) 10 MG tablet   And requested a call back.

## 2024-12-11 NOTE — Telephone Encounter (Signed)
 Called and spoke with Donnae. She reports that she has had off/on swelling since her last office visit on 11/13/24. Last night she had swelling of her chin. This is the first time that she has had swelling on her chin. She denies any rashes, urticarial lesions, difficulty breathing or swallowing.She did take one left over prednisone  this morning and the swelling is down. She does not notice any swelling now. . She does have an EpiPen . She also took Allegra  and Pepcid  today. She has not been taking these medications daily. Yesterday she did have fruit for lunch and cereal for breakfast. Last night she had scrambled egg and 2-3 pieces of bacon. She has been avoiding Paprika. She did not keep track of what she was eating the other times she had swelling. She does not know of any recent tick bites and has hardly been outside. She is not taking any new medications.  She wonders if she has a new allergy because prior to this she has gone 2-3 years without any swelling.  Instructed her to keep upcoming appointment with us  on 12/19/23. Also, she needs to start taking Allegra  once a day and Pepcid  once a day. If she has any life threatening symptoms use her EpiPen  and then call 911.  She will also start to  journal what she she was doing prior to the swelling occuring such as food, medications, or products used. Prescription of prednisone  sent to have on hand in case swelling occurs again. She verbalizes understanding.   Wanda Craze, FNP [facility]

## 2024-12-11 NOTE — Telephone Encounter (Signed)
 Is she currently swelling now and where is the swelling? When did the swelling start?

## 2024-12-17 NOTE — Patient Instructions (Incomplete)
 Allergic Angioedema (lip swelling)   -Uncertain as what is causing your swelling   - avoid foods that contain Paprika in ingredients list.     - If your symptoms re-occur, begin a journal of events that occurred for up to 6 hours before your symptoms began including foods and beverages consumed, soaps or perfumes you had contact with, and medications.   - continue allegra  and pepcid  once a day   - have access to epipen  device in case of allergic reaction and follow emergency action plan  -C4 normal (28)  Follow up weeks or sooner

## 2024-12-18 ENCOUNTER — Ambulatory Visit: Admitting: Family

## 2024-12-18 ENCOUNTER — Telehealth: Payer: Self-pay | Admitting: Family

## 2024-12-18 NOTE — Telephone Encounter (Signed)
 Lindsey Erickson called to cancel appointment due to the weather. She reports that she has not had any episodes since we last spoke on the phone. She has been taking Allegra  180 mg once a day and Pepcid  once a day. She wonders if she needs to continue taking these medications. Instructed her to continue Allegra  180 mg once a day and Pepcid  once a day until we see her back. She will contact our office to reschedule an appointment once the weather gets better.   Wanda Craze, FNP [facility]

## 2025-01-07 ENCOUNTER — Ambulatory Visit

## 2025-03-13 ENCOUNTER — Ambulatory Visit: Admitting: Endocrinology

## 2025-03-20 ENCOUNTER — Ambulatory Visit: Admitting: Endocrinology

## 2025-07-15 ENCOUNTER — Other Ambulatory Visit

## 2025-07-19 ENCOUNTER — Encounter: Admitting: Family Medicine

## 2025-07-25 ENCOUNTER — Encounter
